# Patient Record
Sex: Male | Born: 1990
Health system: Southern US, Community
[De-identification: ages and names within clinical notes are randomized; demographics above are authoritative.]

## PROBLEM LIST (undated history)

## (undated) DIAGNOSIS — E1110 Type 2 diabetes mellitus with ketoacidosis without coma: Secondary | ICD-10-CM

## (undated) DIAGNOSIS — E669 Obesity, unspecified: Secondary | ICD-10-CM

## (undated) DIAGNOSIS — R739 Hyperglycemia, unspecified: Secondary | ICD-10-CM

## (undated) DIAGNOSIS — E119 Type 2 diabetes mellitus without complications: Secondary | ICD-10-CM

## (undated) DIAGNOSIS — J45909 Unspecified asthma, uncomplicated: Secondary | ICD-10-CM

## (undated) HISTORY — PX: LACERATION REPAIR: SHX5168

## (undated) HISTORY — PX: TONSILLECTOMY: SUR1361

## (undated) HISTORY — PX: ADENOIDECTOMY: SUR15

---

## 2014-01-22 ENCOUNTER — Encounter (HOSPITAL_COMMUNITY): Payer: Self-pay | Admitting: Emergency Medicine

## 2014-01-22 ENCOUNTER — Emergency Department (HOSPITAL_COMMUNITY)
Admission: EM | Admit: 2014-01-22 | Discharge: 2014-01-22 | Disposition: A | Discharge status: Home / Self Care | Payer: Self-pay | Attending: Emergency Medicine | Admitting: Emergency Medicine

## 2014-01-22 ENCOUNTER — Emergency Department (HOSPITAL_COMMUNITY): Payer: Self-pay

## 2014-01-22 DIAGNOSIS — S6990XA Unspecified injury of unspecified wrist, hand and finger(s), initial encounter: Principal | ICD-10-CM | POA: Insufficient documentation

## 2014-01-22 DIAGNOSIS — F17200 Nicotine dependence, unspecified, uncomplicated: Secondary | ICD-10-CM | POA: Insufficient documentation

## 2014-01-22 DIAGNOSIS — L03019 Cellulitis of unspecified finger: Secondary | ICD-10-CM | POA: Insufficient documentation

## 2014-01-22 DIAGNOSIS — S6980XA Other specified injuries of unspecified wrist, hand and finger(s), initial encounter: Secondary | ICD-10-CM | POA: Insufficient documentation

## 2014-01-22 DIAGNOSIS — IMO0001 Reserved for inherently not codable concepts without codable children: Secondary | ICD-10-CM

## 2014-01-22 MED ORDER — OXYCODONE-ACETAMINOPHEN 5-325 MG PO TABS: 1.0000 | ORAL_TABLET | Freq: Once | ORAL | Status: DC

## 2014-01-22 MED ORDER — CEPHALEXIN 500 MG PO CAPS: 500.0000 mg | ORAL_CAPSULE | Freq: Four times a day (QID) | ORAL | Status: DC

## 2014-01-22 MED ORDER — IBUPROFEN 600 MG PO TABS: 600.0000 mg | ORAL_TABLET | Freq: Four times a day (QID) | ORAL | Status: DC | PRN

## 2014-01-22 MED ORDER — OXYCODONE-ACETAMINOPHEN 5-325 MG PO TABS: ORAL_TABLET | ORAL | Status: DC

## 2014-01-22 MED ORDER — SULFAMETHOXAZOLE-TRIMETHOPRIM 800-160 MG PO TABS: 1.0000 | ORAL_TABLET | Freq: Two times a day (BID) | ORAL | Status: DC
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
## 2014-01-22 NOTE — Discharge Instructions (Signed)
Paronychia   Paronychia is an infection of the skin caused by germs. It happens by the fingernail or toenail. You can avoid it by not:   Pulling on hangnails.   Nail biting.   Thumb sucking.   Cutting fingernails and toenails too short.   Cutting the skin at the base and sides of the fingernail or toenail (cuticle).  HOME CARE   Keep the fingers or toes very dry. Put rubber gloves over cotton gloves when putting hands in water.   Keep the wound clean and bandaged (dressed) as told by your doctor.   Soak the fingers or toes in warm water for 15 to 20 minutes. Soak them 3 to 4 times per day for germ infections. Fungal infections are difficult to treat. Fungal infections often require treatment for a long time.   Only take medicine as told by your doctor.  GET HELP RIGHT AWAY IF:    You have redness, puffiness (swelling), or pain that gets worse.   You see yellowish-white fluid (pus) coming from the wound.   You have a fever.   You have a bad smell coming from the wound or bandage.  MAKE SURE YOU:   Understand these instructions.   Will watch your condition.   Will get help if you are not doing well or get worse.  Document Released: 10/04/2009 Document Revised: 01/08/2012 Document Reviewed: 10/04/2009  Exit[employer] Patient Information 2014 [employer].
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
## 2014-01-22 NOTE — ED Notes (Signed)
Patient transported to X-ray 

## 2014-01-22 NOTE — ED Notes (Signed)
Right pointer finger swollen x 2 days; iced it but did not go down; gotten worse and painful.

## 2014-01-22 NOTE — ED Notes (Signed)
Suture cart at bedside 

## 2014-01-25 NOTE — ED Provider Notes (Signed)
Medical screening examination/treatment/procedure(s) were conducted as a shared visit with non-physician practitioner(s) and myself.  I personally evaluated the patient during the encounter.   EKG Interpretation None     Physical exam reveals a paronychia of the second right finger.   Possibility of early felon entertained. Discharge medications and instructions per physician's assistant  Donnetta HutchingBrian Wyonia Fontanella, MD 01/25/14 1517

## 2015-01-18 ENCOUNTER — Encounter (HOSPITAL_COMMUNITY): Payer: Self-pay | Admitting: Emergency Medicine

## 2015-01-18 ENCOUNTER — Emergency Department (HOSPITAL_COMMUNITY)
Admission: EM | Admit: 2015-01-18 | Discharge: 2015-01-18 | Disposition: A | Discharge status: Home / Self Care | Payer: Self-pay | Attending: Emergency Medicine | Admitting: Emergency Medicine

## 2015-01-18 DIAGNOSIS — Z792 Long term (current) use of antibiotics: Secondary | ICD-10-CM | POA: Insufficient documentation

## 2015-01-18 DIAGNOSIS — Z720 Tobacco use: Secondary | ICD-10-CM | POA: Insufficient documentation

## 2015-01-18 DIAGNOSIS — L03011 Cellulitis of right finger: Secondary | ICD-10-CM | POA: Insufficient documentation

## 2015-01-18 DIAGNOSIS — J45909 Unspecified asthma, uncomplicated: Secondary | ICD-10-CM | POA: Insufficient documentation

## 2015-01-18 HISTORY — DX: Unspecified asthma, uncomplicated: J45.909

## 2015-01-18 MED ORDER — DOXYCYCLINE HYCLATE 100 MG PO TABS
100.0000 mg | ORAL_TABLET | Freq: Once | ORAL | Status: AC
  Administered 2015-01-18: 100 mg via ORAL
  Filled 2015-01-18: qty 1

## 2015-01-18 MED ORDER — RANITIDINE HCL 150 MG/10ML PO SYRP: 300.0000 mg | ORAL_SOLUTION | Freq: Once | ORAL | Status: DC

## 2015-01-18 MED ORDER — LORATADINE 10 MG PO TABS: 10.0000 mg | ORAL_TABLET | Freq: Once | ORAL | Status: DC

## 2015-01-18 MED ORDER — CEPHALEXIN 250 MG PO CAPS
500.0000 mg | ORAL_CAPSULE | Freq: Once | ORAL | Status: AC
  Administered 2015-01-18: 500 mg via ORAL
  Filled 2015-01-18: qty 2

## 2015-01-18 MED ORDER — CEPHALEXIN 500 MG PO CAPS: 500.0000 mg | ORAL_CAPSULE | Freq: Two times a day (BID) | ORAL | Status: DC

## 2015-01-18 MED ORDER — DOXYCYCLINE HYCLATE 100 MG PO CAPS: 100.0000 mg | ORAL_CAPSULE | Freq: Two times a day (BID) | ORAL | Status: DC

## 2015-01-18 MED ORDER — LIDOCAINE-EPINEPHRINE 1 %-1:100000 IJ SOLN
10.0000 mL | Freq: Once | INTRAMUSCULAR | Status: AC
  Administered 2015-01-18: 10 mL via INTRADERMAL
  Filled 2015-01-18: qty 1
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
## 2015-01-18 NOTE — Discharge Instructions (Signed)
Paronychia  [name], your finger infection likely came from biting her nails. Try to refrain from doing this in the future. Take antibiotics as prescribed for 5 days and follow-up with her primary care physician within 3 days for repeat evaluation of your finger. You also need to your blood pressure rechecked as it was high today. If any symptoms worsen come back to the emergency department and medially. Thank you. Paronychia is an infection of the skin caused by germs. It happens by the fingernail or toenail. You can avoid it by not:  Pulling on hangnails.  Nail biting.  Thumb sucking.  Cutting fingernails and toenails too short.  Cutting the skin at the base and sides of the fingernail or toenail (cuticle). HOME CARE  Keep the fingers or toes very dry. Put rubber gloves over cotton gloves when putting hands in water.  Keep the wound clean and bandaged (dressed) as told by your doctor.  Soak the fingers or toes in warm water for 15 to 20 minutes. Soak them 3 to 4 times per day for germ infections. Fungal infections are difficult to treat. Fungal infections often require treatment for a long time.  Only take medicine as told by your doctor. GET HELP RIGHT AWAY IF:   You have redness, puffiness (swelling), or pain that gets worse.  You see yellowish-white fluid (pus) coming from the wound.  You have a fever.  You have a bad smell coming from the wound or bandage. MAKE SURE YOU:  Understand these instructions.  Will watch your condition.  Will get help if you are not doing well or get worse. Document Released: 10/04/2009 Document Revised: 01/08/2012 Document Reviewed: 10/04/2009 [facility] Patient Information 2015 [employer], [place]. This information is not intended to replace advice given to you by your health care provider. Make sure you discuss any questions you have with your health care provider. Hypertension Hypertension is another name for high blood pressure. High  blood pressure forces your heart to work harder to pump blood. A blood pressure reading has two numbers, which includes a higher number over a lower number (example: 110/72). HOME CARE   Have your blood pressure rechecked by your doctor.  Only take medicine as told by your doctor. Follow the directions carefully. The medicine does not work as well if you skip doses. Skipping doses also puts you at risk for problems.  Do not smoke.  Monitor your blood pressure at home as told by your doctor. GET HELP IF:  You think you are having a reaction to the medicine you are taking.  You have repeat headaches or feel dizzy.  You have puffiness (swelling) in your ankles.  You have trouble with your vision. GET HELP RIGHT AWAY IF:   You get a very bad headache and are confused.  You feel weak, numb, or faint.  You get chest or belly (abdominal) pain.  You throw up (vomit).  You cannot breathe very well. MAKE SURE YOU:   Understand these instructions.  Will watch your condition.  Will get help right away if you are not doing well or get worse. Document Released: 04/03/2008 Document Revised: 10/21/2013 Document Reviewed: 08/08/2013 [facility] Patient Information 2015 [place], [place]. This information is not intended to replace advice given to you by your health care provider. Make sure you discuss any questions you have with your health care provider.
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
## 2015-06-29 ENCOUNTER — Encounter (HOSPITAL_COMMUNITY): Payer: Self-pay | Admitting: Emergency Medicine

## 2015-06-29 ENCOUNTER — Emergency Department (HOSPITAL_COMMUNITY)
Admission: EM | Admit: 2015-06-29 | Discharge: 2015-06-30 | Disposition: A | Discharge status: Home / Self Care | Payer: Self-pay | Attending: Emergency Medicine | Admitting: Emergency Medicine

## 2015-06-29 DIAGNOSIS — Z792 Long term (current) use of antibiotics: Secondary | ICD-10-CM | POA: Insufficient documentation

## 2015-06-29 DIAGNOSIS — K088 Other specified disorders of teeth and supporting structures: Secondary | ICD-10-CM | POA: Insufficient documentation

## 2015-06-29 DIAGNOSIS — J45909 Unspecified asthma, uncomplicated: Secondary | ICD-10-CM | POA: Insufficient documentation

## 2015-06-29 DIAGNOSIS — J029 Acute pharyngitis, unspecified: Secondary | ICD-10-CM | POA: Insufficient documentation

## 2015-06-29 DIAGNOSIS — Z720 Tobacco use: Secondary | ICD-10-CM | POA: Insufficient documentation

## 2015-06-29 DIAGNOSIS — K0889 Other specified disorders of teeth and supporting structures: Secondary | ICD-10-CM

## 2015-06-29 DIAGNOSIS — H9201 Otalgia, right ear: Secondary | ICD-10-CM | POA: Insufficient documentation

## 2015-06-29 NOTE — ED Provider Notes (Signed)
History  This chart was scribed for non-physician practitioner, Fayrene Helper, PA-C,working with Shon Baton, MD, by Karle Plumber, ED Scribe. This patient was seen in room TR10C/TR10C and the patient's care was started at 11:57 PM.  Chief Complaint  Patient presents with  . Dental Pain   The history is provided by the patient and medical records. No language interpreter was used.    HPI Comments:  [name] is a 24 y.o. morbidly obese male who presents to the Emergency Department complaining of severe right-sided dental pain that began approximately three weeks ago. He reports associated right ear otalgia and intermittent sore throat as well. Pt reports he has a crack in one of his teeth on the right side. He has been taking BC Powder with no significant relief of the pain. cold air, cold liquids and foods and chewing causes the pain to worsen. He denies modifying factors. He denies fever, chills, nausea, vomiting or facial swelling. He does not have a Education officer, community.  Past Medical History  Diagnosis Date  . Asthma    Past Surgical History  Procedure Laterality Date  . Tonsillectomy     No family history on file. Social History  Substance Use Topics  . Smoking status: Current Every Day Smoker -- 0.00 packs/day    Types: Cigarettes  . Smokeless tobacco: None  . Alcohol Use: No    Review of Systems  Constitutional: Negative for fever and chills.  HENT: Positive for dental problem, ear pain and sore throat. Negative for facial swelling.   Gastrointestinal: Negative for nausea and vomiting.    Allergies  Vicodin and Lactose intolerance (gi)  Home Medications   Prior to Admission medications   Medication Sig Start Date End Date Taking? Authorizing Provider  cephALEXin (KEFLEX) 500 MG capsule Take 1 capsule (500 mg total) by mouth 2 (two) times daily. 01/18/15   Tomasita Crumble, MD  doxycycline (VIBRAMYCIN) 100 MG capsule Take 1 capsule (100 mg total) by mouth 2 (two) times  daily. One po bid x 7 days 01/18/15   Tomasita Crumble, MD  ibuprofen (ADVIL,MOTRIN) 600 MG tablet Take 1 tablet (600 mg total) by mouth every 6 (six) hours as needed. 01/22/14   Junius Finner, PA-C  oxyCODONE-acetaminophen (PERCOCET/ROXICET) 5-325 MG per tablet Take 1-2 pills every 4-6 hours as needed for pain. 01/22/14   Junius Finner, PA-C  sulfamethoxazole-trimethoprim (SEPTRA DS) 800-160 MG per tablet Take 1 tablet by mouth every 12 (twelve) hours. 01/22/14   Junius Finner, PA-C   There were no vitals taken for this visit. Physical Exam  Constitutional: He is oriented to person, place, and time. He appears well-developed and well-nourished.  HENT:  Head: Normocephalic and atraumatic.  Right Ear: Tympanic membrane and ear canal normal.  Left Ear: Tympanic membrane and ear canal normal.  Mouth/Throat: Uvula is midline, oropharynx is clear and moist and mucous membranes are normal. No trismus in the jaw. Normal dentition. No dental caries. No oropharyngeal exudate, posterior oropharyngeal edema, posterior oropharyngeal erythema or tonsillar abscesses.  Tenderness to tooth #2. No significant dental decay.  Eyes: EOM are normal.  Neck: Normal range of motion.  Cardiovascular: Normal rate.   Pulmonary/Chest: Effort normal.  Musculoskeletal: Normal range of motion.  Lymphadenopathy:    He has no cervical adenopathy.  Neurological: He is alert and oriented to person, place, and time.  Skin: Skin is warm and dry.  Psychiatric: He has a normal mood and affect. His behavior is normal.  Nursing note and vitals reviewed.  ED Course  Procedures (including critical care time) DIAGNOSTIC STUDIES:  COORDINATION OF CARE: 12:03 AM- Will prescribe PCN and Ibuprofen and order first doses here. Pt verbalizes understanding and agrees to plan.  Medications  penicillin v potassium (VEETID) tablet 500 mg (not administered)  ibuprofen (ADVIL,MOTRIN) tablet 800 mg (not administered)    MDM   Final  diagnoses:  Pain, dental    I personally performed the services described in this documentation, which was scribed in my presence. The recorded information has been reviewed and is accurate.     Fayrene Helper, PA-C 06/30/15 8119  Shon Baton, MD 06/30/15 [phone]

## 2015-06-29 NOTE — ED Notes (Signed)
Pt c/o upper and lower right dental pain x 3 weeks

## 2015-06-29 NOTE — ED Notes (Signed)
Pt. reports right upper/lower molar pain for several weeks unrelieved by OTC medication .

## 2015-06-30 MED ORDER — IBUPROFEN 600 MG PO TABS: 600.0000 mg | ORAL_TABLET | Freq: Four times a day (QID) | ORAL | Status: DC | PRN

## 2015-06-30 MED ORDER — PENICILLIN V POTASSIUM 250 MG PO TABS
500.0000 mg | ORAL_TABLET | Freq: Once | ORAL | Status: AC
  Administered 2015-06-30: 500 mg via ORAL
  Filled 2015-06-30: qty 2

## 2015-06-30 MED ORDER — IBUPROFEN 400 MG PO TABS
800.0000 mg | ORAL_TABLET | Freq: Once | ORAL | Status: AC
  Administered 2015-06-30: 800 mg via ORAL
  Filled 2015-06-30: qty 2

## 2015-06-30 MED ORDER — PENICILLIN V POTASSIUM 500 MG PO TABS: 500.0000 mg | ORAL_TABLET | Freq: Three times a day (TID) | ORAL | Status: DC

## 2015-06-30 NOTE — Discharge Instructions (Signed)

## 2015-07-21 ENCOUNTER — Encounter (HOSPITAL_COMMUNITY): Payer: Self-pay | Admitting: Emergency Medicine

## 2015-07-21 ENCOUNTER — Emergency Department (HOSPITAL_COMMUNITY)
Admission: EM | Admit: 2015-07-21 | Discharge: 2015-07-21 | Disposition: A | Discharge status: Home / Self Care | Payer: Self-pay | Attending: Emergency Medicine | Admitting: Emergency Medicine

## 2015-07-21 DIAGNOSIS — J45909 Unspecified asthma, uncomplicated: Secondary | ICD-10-CM | POA: Insufficient documentation

## 2015-07-21 DIAGNOSIS — Z720 Tobacco use: Secondary | ICD-10-CM | POA: Insufficient documentation

## 2015-07-21 DIAGNOSIS — H9201 Otalgia, right ear: Secondary | ICD-10-CM | POA: Insufficient documentation

## 2015-07-21 DIAGNOSIS — K0889 Other specified disorders of teeth and supporting structures: Secondary | ICD-10-CM

## 2015-07-21 DIAGNOSIS — Z792 Long term (current) use of antibiotics: Secondary | ICD-10-CM | POA: Insufficient documentation

## 2015-07-21 DIAGNOSIS — K088 Other specified disorders of teeth and supporting structures: Secondary | ICD-10-CM | POA: Insufficient documentation

## 2015-07-21 MED ORDER — NAPROXEN 500 MG PO TABS: 500.0000 mg | ORAL_TABLET | Freq: Two times a day (BID) | ORAL | Status: DC

## 2015-07-21 MED ORDER — ANTIPYRINE-BENZOCAINE 5.4-1.4 % OT SOLN: 3.0000 [drp] | OTIC | Status: DC | PRN

## 2015-07-21 MED ORDER — PENICILLIN V POTASSIUM 500 MG PO TABS: 500.0000 mg | ORAL_TABLET | Freq: Three times a day (TID) | ORAL | Status: DC

## 2015-07-21 NOTE — Discharge Instructions (Signed)
Dental Pain °A tooth ache may be caused by cavities (tooth decay). Cavities expose the nerve of the tooth to air and hot or cold temperatures. It may come from an infection or abscess (also called a boil or furuncle) around your tooth. It is also often caused by dental caries (tooth decay). This causes the pain you are having. °DIAGNOSIS  °Your caregiver can diagnose this problem by exam. °TREATMENT  °· If caused by an infection, it may be treated with medications which kill germs (antibiotics) and pain medications as prescribed by your caregiver. Take medications as directed. °· Only take over-the-counter or prescription medicines for pain, discomfort, or fever as directed by your caregiver. °· Whether the tooth ache today is caused by infection or dental disease, you should see your dentist as soon as possible for further care. °SEEK MEDICAL CARE IF: °The exam and treatment you received today has been provided on an emergency basis only. This is not a substitute for complete medical or dental care. If your problem worsens or new problems (symptoms) appear, and you are unable to meet with your dentist, call or return to this location. °SEEK IMMEDIATE MEDICAL CARE IF:  °· You have a fever. °· You develop redness and swelling of your face, jaw, or neck. °· You are unable to open your mouth. °· You have severe pain uncontrolled by pain medicine. °MAKE SURE YOU:  °· Understand these instructions. °· Will watch your condition. °· Will get help right away if you are not doing well or get worse. °Document Released: 10/16/2005 Document Revised: 01/08/2012 Document Reviewed: 06/03/2008 °ExitCare® Patient Information ©2015 [employer]. This information is not intended to replace advice given to you by your health care provider. Make sure you discuss any questions you have with your health care provider. ° °Emergency Department Resource Guide °1) Find a Doctor and Pay Out of Pocket °Although you won't have to find out who  is covered by your insurance plan, it is a good idea to ask around and get recommendations. You will then need to call the office and see if the doctor you have chosen will accept you as a new patient and what types of options they offer for patients who are self-pay. Some doctors offer discounts or will set up payment plans for their patients who do not have insurance, but you will need to ask so you aren't surprised when you get to your appointment. ° °2) Contact Your Local Health Department °Not all health departments have doctors that can see patients for sick visits, but many do, so it is worth a call to see if yours does. If you don't know where your local health department is, you can check in your phone book. The CDC also has a tool to help you locate your state's health department, and many state websites also have listings of all of their local health departments. ° °3) Find a Walk-in Clinic °If your illness is not likely to be very severe or complicated, you may want to try a walk in clinic. These are popping up all over the country in pharmacies, drugstores, and shopping centers. They're usually staffed by nurse practitioners or physician assistants that have been trained to treat common illnesses and complaints. They're usually fairly quick and inexpensive. However, if you have serious medical issues or chronic medical problems, these are probably not your best option. ° °No Primary Care Doctor: °- Call [facility] at  832-8000 - they can help you locate a primary   care doctor that  accepts your insurance, provides certain services, etc. °- Physician Referral Service- 1-800-533-3463 ° °Chronic Pain Problems: °Organization         Address  Phone   Notes  °[facility]  (336) 297-2271 Patients need to be referred by their primary care doctor.  ° °Medication Assistance: °Organization         Address  Phone   Notes  °[place] [facility] [address]  [address] °[place], [place] [address] (336) 641-8030 --Must be a resident of [place] °-- Must have NO insurance coverage whatsoever (no Medicaid/ Medicare, etc.) °-- The pt. MUST have a primary care doctor that directs their care regularly and follows them in the community °  °[facility]  (866) 331-1348   °[employer]  (888) 892-1162   ° °Agencies that provide inexpensive medical care: °Organization         Address  Phone   Notes  °[facility]  (336) 832-8035   °[facility]    (336) 832-7272   °[facility] [address] °[place], [place] [address] (336) 832-4777   °[facility] [address], °[place] (336) 271-4999   °[facility]    (336) 373-0678   °[facility]    (336) 272-1050   °[facility] ° [address], [place] Phone:  (336) 832-4444, Fax:  (336) 832-4440 Hours of Operation:  9 am - 6 pm, M-F.  Also accepts Medicaid/Medicare and self-pay.  °[facility] ° [address], [place] Phone: (336) 832-3150, Fax: (336) 832-3151. Hours of Operation:  8:30 am - 5:30 pm, M-F.  Also accepts Medicaid and self-pay.  °[facility] [place] [address], [place] Phone: (336) 878-6027   °[facility] [address], [place], [place] (336)723-1848, Ext. 123 Mondays & Thursdays: 7-9 AM.  First 15 patients are seen on a first come, first serve basis. °  ° °Medicaid-accepting [place] Providers: ° °Organization         Address  Phone   Notes  °[facility] [address], [place] (336) 641-2100 Also accepts self-pay patients.  °[facility] [address], [place] ° (336) 856-9996   °[facility] [address], [place] (336) 288-8857   °[facility] [address] [place] [address], [place] (336) 299-7000   °Veita Bland [address], [place]  ° (336) 373-1557 Only accepts [place] Access Medicaid patients after they have their name applied to their card.  ° °Self-Pay (no insurance) in [place]: ° °Organization         Address  Phone   Notes  °Sickle Cell Patients, [facility] [address], [place] (336) 832-1970   °[facility] [address], [place] (336) 832-4400   °[facility] [place] ° [address], [place] (336) 992-4800   °[facility]/Dr. Osei-Bonsu ° [address] [place] [address], [place] or [address], [place] (336) 841-8500 Phone number for both [place] and [place] locations is the same.  °[facility] [address], [place] (336) 299-0000   °[facility] [address] [place] [address],  or [address] (336) 852-7530 °(336) 878-2260   °  [facility] [address], [place] (336) 350-1642, phone; (336) 294-5005, fax Sees patients 1st and 3rd Saturday of every month.  Must not qualify for public or private insurance (i.e. Medicaid, Medicare, [place] Health Choice, Veterans' Benefits) • Household income should be no more than 200% of the poverty level •The clinic cannot treat you if you are pregnant or think you are pregnant • Sexually transmitted diseases are not treated at the clinic.  ° ° °Dental Care: °Organization         Address  Phone  Notes  °[facility] [address], [place] (336) 641-6152 Accepts children up to age 21 who are enrolled in Medicaid or [place] Health Choice; pregnant women with a Medicaid card; and children who have applied for Medicaid or [place] Health Choice, but were declined, whose parents can pay a reduced fee at time of service.  °[facility] [place]  [address], [place] (336) 641-7733 Accepts children up to age 21 who are  enrolled in Medicaid or [place] Health Choice; pregnant women with a Medicaid card; and children who have applied for Medicaid or [place] Health Choice, but were declined, whose parents can pay a reduced fee at time of service.  °[facility] ° [address], [place] (336) 641-4533 Patients are seen by appointment only. Walk-ins are not accepted. [facility] will see patients 18 years of age and older. °Monday - Tuesday (8am-5pm) °Most Wednesdays (8:30-5pm) °$30 per visit, cash only  °[facility] ° [address], [place] (336) 641-4533 Patients are seen by appointment only. Walk-ins are not accepted. [facility] will see patients 18 years of age and older. °One Wednesday Evening (Monthly: Volunteer Based).  $30 per visit, cash only  °[facility]  (919) 537-3737 for adults; Children under age 4, call Graduate Pediatric Dentistry at (919) [phone]. Children aged 4-14, please call (919) [phone] to request a pediatric application. ° Dental services are provided in all areas of dental care including fillings, crowns and bridges, complete and partial dentures, implants, gum treatment, root canals, and extractions. Preventive care is also provided. Treatment is provided to both adults and children. °Patients are selected via a lottery and there is often a waiting list. °  °[facility] [address], °[place] ° (336) 763-8833 [email or web address] °  °[facility] [address], [place], [place] (336)723-1848, Ext. 123 Second and Fourth Thursday of each month, opens at 6:30 AM; Clinic ends at 9 AM.  Patients are seen on a first-come first-served basis, and a limited number are seen during each clinic.  ° °[facility] ° [address], [place], [place] (336) 723-7904   Eligibility Requirements °You must have lived in [place], [place], or [place] counties for at least the last three months. °  You  cannot be eligible for state or federal sponsored healthcare insurance, including Veterans Administration, Medicaid, or Medicare. °  You generally cannot be eligible for healthcare insurance through your employer.  °  How to apply: °Eligibility screenings are held every Tuesday and Wednesday afternoon from 1:00 pm until 4:00 pm. You do not need an appointment for the interview!  °[facility] [address], [place], [place] 336-631-2330   °[facility]  336-342-8273   °[facility]  336-703-3100   °[facility]  336-570-6415   ° °Behavioral Health Resources in the Community: °Intensive Outpatient Programs °Organization         Address  Phone  Notes  °[facility] [address], [place], [place] 336-878-6098   °[facility] [address], [place], [place] 336-832-9800   °[facility] [address], [place], [place] ° 336-882-2125   °[facility] [address],  °[place], [place] 1-800-853-5163 or 336-641-4981   °Substance Abuse Resources °Organization         Address  Phone  Notes  °[facility]  336-882-2125   °[facility]  336-784-9470   °[facility]  336-285-9073   °[facility]  336-845-3988   °Residential & Outpatient Substance Abuse Program  1-800-659-3381   °Psychological Services °Organization         Address  Phone  Notes  °[facility]  336- 832-9600   °[facility]  336- 378-7881   °[facility] [address], [place] 1-800-853-5163 or 336-641-4981   ° °Mobile Crisis Teams °Organization         Address  Phone  Notes  °[facility], Mobile Crisis Care Unit  1-877-626-1772   °Assertive °[facility] ° [address] [place], [place] 336-834-9664   °Sharon DeEsch [address] °[place] 336-554-5454   ° °Self-Help/Support  Groups °Organization         Address  Phone             Notes  °[facility] - variety of support groups  336- 373-1402 Call for more information  °Narcotics Anonymous (NA), [facility] [address], °[place]  2 meetings at this location  ° °Residential Treatment Programs °Organization         Address  Phone  Notes  °[facility] [address],    °[place]  1-866-801-8205   °[facility] ° [address], [place], [place] 704-293-8524   °[facility] [address], [place] 336-845-3988 Admissions: 8am-3pm M-F  °[facility] [address]    °[place], [place] 336-841-1104   °[facility] [address], [place], [place] 336-379-7146   °[facility] [address]  °, [place] 336-285-9073   °[facility] - Intensive Outpatient [address], , [place] 336-852-3033   °[facility] [address]  °[place], [place] 1-877-615-2722 or 336-784-9470   °[facility] [address] [place], [place] 336-227-7417 Accepts Medicaid  °[facility] [address]  ° [place] 1-800-659-3381 Substance Abuse/Addiction Treatment  ° °[place] Behavioral Health Resources °Organization         Address  Phone  Notes  °[facility]  (888) 581-9988   °Julie Brannon, PhD [address] [place], [place]   (336) 349-5553 or (336) 951-0000   °[facility]   [address] °[place], [place] (336) 349-4454   °[facility] [address], [place],  (336) 342-8316 Insurance/Medicaid/sponsorship through [facility]  °[facility] [address]                                    [place],  (336) 342-8316 Therapy/tele-psych/case  °[facility]   [address]   [place], [place] [phone]    Dr. Lolly Mustache  (931) 219-3205   [facility]  [employer] [facility] Dept. 1) [address] [place] 2) [address] [place] 3)  [address], [place] 912-108-1152 (818) 194-3581  269-290-7573   [facility] Child Abuse Hotline 959-339-7809 or (912)815-9391 (After Hours)     Otalgia Otalgia is pain in or around the ear. When the pain is from the ear itself it is called primary otalgia. Pain may also be coming from somewhere else, like the head and neck. This is called secondary otalgia.  CAUSES  Causes of primary otalgia include:  Middle ear infection.  It can also be caused by injury to the ear or infection of the ear canal (swimmer's ear). Swimmer's ear causes pain, swelling and often drainage from the ear canal. Causes of secondary otalgia include:  Sinus infections.  Allergies and colds that cause stuffiness of the nose and tubes that drain the ears (eustachian tubes).  Dental problems like cavities, gum infections or teething.  Sore Throat (tonsillitis and pharyngitis).  Swollen glands in the neck.  Infection of the bone behind the ear (mastoiditis).  TMJ discomfort (problems with the joint between your jaw and your skull).  Other problems such as nerve disorders, circulation problems, heart disease and tumors of the head and neck can also cause symptoms of ear pain. This is rare. DIAGNOSIS  Evaluation, Diagnosis and Testing:  Examination by your medical caregiver is recommended to evaluate and diagnose the cause of otalgia.  Further testing or referral to a specialist may be indicated if the cause of the ear pain is not found and the symptom persists. TREATMENT   Your doctor may prescribe antibiotics if an ear infection is diagnosed.  Pain relievers and topical analgesics may be recommended.  It is important to take all medications as prescribed. HOME CARE INSTRUCTIONS   It may be helpful to sleep with the painful ear in the up position.  A warm compress  over the painful ear may provide relief.  A soft diet and avoiding gum may help while ear pain is present. SEEK IMMEDIATE MEDICAL CARE IF:  You develop severe pain, a high fever, repeated vomiting or dehydration.  You develop extreme dizziness, headache, confusion, ringing in the ears (tinnitus) or hearing loss. Document Released: 11/23/2004 Document Revised: 01/08/2012 Document Reviewed: 08/25/2009 [facility] Patient Information 2015 [place], [place]. This information is not intended to replace advice given to you by your health care provider. Make sure you discuss any questions you have with your health care provider.

## 2015-07-21 NOTE — ED Notes (Signed)
Pt. reports right upper molar pain for several weeks unrelieved by OTC Aspirin / Ibuprofen .

## 2015-07-21 NOTE — ED Provider Notes (Signed)
CSN: [id number]     Arrival date & time 07/21/15  0030 History   First MD Initiated Contact with Patient 07/21/15 0046     Chief Complaint  Patient presents with  . Dental Pain     (Consider location/radiation/quality/duration/timing/severity/associated sxs/prior Treatment) Patient is a 24 y.o. male presenting with tooth pain. The history is provided by the patient. No language interpreter was used.  Dental Pain Location:  Upper Upper teeth location:  1/RU 3rd molar Quality:  Throbbing Severity:  Moderate Onset quality:  Gradual Duration:  2 weeks Progression:  Waxing and waning Context: not dental caries   Ineffective treatments:  NSAIDs Associated symptoms: no fever and no trismus   Risk factors: no diabetes     Past Medical History  Diagnosis Date  . Asthma    Past Surgical History  Procedure Laterality Date  . Tonsillectomy     No family history on file. Social History  Substance Use Topics  . Smoking status: Current Every Day Smoker -- 0.00 packs/day    Types: Cigarettes  . Smokeless tobacco: None  . Alcohol Use: No    Review of Systems  Constitutional: Negative for fever.  HENT: Positive for dental problem.   All other systems reviewed and are negative.     Allergies  Vicodin and Lactose intolerance (gi)  Home Medications   Prior to Admission medications   Medication Sig Start Date End Date Taking? Authorizing Provider  ibuprofen (ADVIL,MOTRIN) 600 MG tablet Take 1 tablet (600 mg total) by mouth every 6 (six) hours as needed. 06/30/15   Fayrene Helper, PA-C  penicillin v potassium (VEETID) 500 MG tablet Take 1 tablet (500 mg total) by mouth 3 (three) times daily. 06/30/15   Fayrene Helper, PA-C   BP 149/95 mmHg  Pulse 76  Temp(Src) 98.3 F (36.8 C) (Oral)  Resp 20  Ht  (1.676 m)  Wt 316 lb (143.337 kg)  BMI 51.03 kg/m2  SpO2 100% Physical Exam  Constitutional: He is oriented to person, place, and time. He appears well-developed and  well-nourished.  HENT:  Head: Normocephalic.  Left Ear: There is mastoid tenderness. Tympanic membrane is not injected and not erythematous.  Mouth/Throat: Oropharynx is clear and moist. No oropharyngeal exudate.  Eyes: Pupils are equal, round, and reactive to light.  Neck: Neck supple.  Cardiovascular: Normal rate and regular rhythm.   Pulmonary/Chest: Effort normal and breath sounds normal.  Abdominal: Soft. Bowel sounds are normal.  Musculoskeletal: He exhibits no edema or tenderness.  Lymphadenopathy:    He has no cervical adenopathy.  Neurological: He is alert and oriented to person, place, and time.  Skin: Skin is warm and dry.  Psychiatric: He has a normal mood and affect.  Nursing note and vitals reviewed.   ED Course  Procedures (including critical care time) Labs Review Labs Reviewed - No data to display  Imaging Review No results found. I have personally reviewed and evaluated these images and lab results as part of my medical decision-making.   EKG Interpretation None      MDM   Final diagnoses:  None   Dental pain. Antibiotic, anti-inflammatory, dental follow-up Right otalgia.     [name], NP 07/21/15 0117  Layla Maw Ward, DO 07/21/15 4098

## 2016-07-21 ENCOUNTER — Encounter (HOSPITAL_COMMUNITY): Payer: Self-pay | Admitting: Emergency Medicine

## 2016-07-21 ENCOUNTER — Emergency Department (HOSPITAL_COMMUNITY)
Admission: EM | Admit: 2016-07-21 | Discharge: 2016-07-21 | Disposition: A | Discharge status: Home / Self Care | Payer: No Typology Code available for payment source | Attending: Emergency Medicine | Admitting: Emergency Medicine

## 2016-07-21 ENCOUNTER — Emergency Department (HOSPITAL_COMMUNITY): Payer: No Typology Code available for payment source

## 2016-07-21 DIAGNOSIS — Y939 Activity, unspecified: Secondary | ICD-10-CM | POA: Insufficient documentation

## 2016-07-21 DIAGNOSIS — M545 Low back pain: Secondary | ICD-10-CM | POA: Insufficient documentation

## 2016-07-21 DIAGNOSIS — Y92410 Unspecified street and highway as the place of occurrence of the external cause: Secondary | ICD-10-CM | POA: Insufficient documentation

## 2016-07-21 DIAGNOSIS — Y999 Unspecified external cause status: Secondary | ICD-10-CM | POA: Insufficient documentation

## 2016-07-21 DIAGNOSIS — R0789 Other chest pain: Secondary | ICD-10-CM | POA: Diagnosis not present

## 2016-07-21 DIAGNOSIS — M549 Dorsalgia, unspecified: Secondary | ICD-10-CM | POA: Diagnosis present

## 2016-07-21 DIAGNOSIS — M546 Pain in thoracic spine: Secondary | ICD-10-CM | POA: Diagnosis not present

## 2016-07-21 DIAGNOSIS — F17210 Nicotine dependence, cigarettes, uncomplicated: Secondary | ICD-10-CM | POA: Diagnosis not present

## 2016-07-21 DIAGNOSIS — J45909 Unspecified asthma, uncomplicated: Secondary | ICD-10-CM | POA: Insufficient documentation

## 2016-07-21 MED ORDER — IBUPROFEN 400 MG PO TABS
800.0000 mg | ORAL_TABLET | Freq: Once | ORAL | Status: AC
  Administered 2016-07-21: 800 mg via ORAL
  Filled 2016-07-21: qty 2

## 2016-07-21 MED ORDER — METHOCARBAMOL 500 MG PO TABS
750.0000 mg | ORAL_TABLET | Freq: Once | ORAL | Status: AC
  Administered 2016-07-21: 750 mg via ORAL
  Filled 2016-07-21: qty 2

## 2016-07-21 MED ORDER — IBUPROFEN 600 MG PO TABS: 600.0000 mg | ORAL_TABLET | Freq: Four times a day (QID) | ORAL | 0 refills | Status: DC | PRN

## 2016-07-21 MED ORDER — METHOCARBAMOL 500 MG PO TABS: 500.0000 mg | ORAL_TABLET | Freq: Two times a day (BID) | ORAL | 0 refills | Status: DC
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
## 2016-07-21 NOTE — Discharge Instructions (Signed)
Take your medications as prescribed as needed for pain relief. I also recommend applying ice for 15 minutes 3-4 times daily to affected areas for pain relief. Please follow up with a primary care provider from the Resource Guide provided below in one week if your symptoms have not improved. Please return to the Emergency Department if symptoms worsen or new onset of fever, numbness, tingling, groin anesthesia, loss of bowel or bladder, weakness.

## 2017-04-02 ENCOUNTER — Emergency Department (HOSPITAL_COMMUNITY): Payer: Self-pay

## 2017-04-02 ENCOUNTER — Emergency Department (HOSPITAL_COMMUNITY)
Admission: EM | Admit: 2017-04-02 | Discharge: 2017-04-02 | Disposition: A | Discharge status: Home / Self Care | Payer: Self-pay | Attending: Emergency Medicine | Admitting: Emergency Medicine

## 2017-04-02 ENCOUNTER — Encounter (HOSPITAL_COMMUNITY): Payer: Self-pay | Admitting: *Deleted

## 2017-04-02 DIAGNOSIS — J45909 Unspecified asthma, uncomplicated: Secondary | ICD-10-CM | POA: Insufficient documentation

## 2017-04-02 DIAGNOSIS — Y929 Unspecified place or not applicable: Secondary | ICD-10-CM | POA: Insufficient documentation

## 2017-04-02 DIAGNOSIS — S61215A Laceration without foreign body of left ring finger without damage to nail, initial encounter: Secondary | ICD-10-CM | POA: Insufficient documentation

## 2017-04-02 DIAGNOSIS — Y990 Civilian activity done for income or pay: Secondary | ICD-10-CM | POA: Insufficient documentation

## 2017-04-02 DIAGNOSIS — Z23 Encounter for immunization: Secondary | ICD-10-CM | POA: Insufficient documentation

## 2017-04-02 DIAGNOSIS — W208XXA Other cause of strike by thrown, projected or falling object, initial encounter: Secondary | ICD-10-CM | POA: Insufficient documentation

## 2017-04-02 DIAGNOSIS — F17210 Nicotine dependence, cigarettes, uncomplicated: Secondary | ICD-10-CM | POA: Insufficient documentation

## 2017-04-02 DIAGNOSIS — Z79899 Other long term (current) drug therapy: Secondary | ICD-10-CM | POA: Insufficient documentation

## 2017-04-02 DIAGNOSIS — Y939 Activity, unspecified: Secondary | ICD-10-CM | POA: Insufficient documentation

## 2017-04-02 HISTORY — DX: Obesity, unspecified: E66.9

## 2017-04-02 MED ORDER — SULFAMETHOXAZOLE-TRIMETHOPRIM 800-160 MG PO TABS: 1.0000 | ORAL_TABLET | Freq: Two times a day (BID) | ORAL | 0 refills | Status: AC

## 2017-04-02 MED ORDER — OXYCODONE-ACETAMINOPHEN 5-325 MG PO TABS
1.0000 | ORAL_TABLET | Freq: Once | ORAL | Status: AC
  Administered 2017-04-02: 1 via ORAL
  Filled 2017-04-02: qty 1

## 2017-04-02 MED ORDER — SULFAMETHOXAZOLE-TRIMETHOPRIM 800-160 MG PO TABS
1.0000 | ORAL_TABLET | Freq: Once | ORAL | Status: AC
  Administered 2017-04-02: 1 via ORAL
  Filled 2017-04-02: qty 1

## 2017-04-02 MED ORDER — TETANUS-DIPHTH-ACELL PERTUSSIS 5-2.5-18.5 LF-MCG/0.5 IM SUSP
0.5000 mL | Freq: Once | INTRAMUSCULAR | Status: AC
  Administered 2017-04-02: 0.5 mL via INTRAMUSCULAR
  Filled 2017-04-02: qty 0.5

## 2017-04-02 MED ORDER — OXYCODONE-ACETAMINOPHEN 5-325 MG PO TABS: 2.0000 | ORAL_TABLET | Freq: Three times a day (TID) | ORAL | 0 refills | Status: DC | PRN

## 2017-04-02 MED ORDER — LIDOCAINE HCL 2 % IJ SOLN
10.0000 mL | Freq: Once | INTRAMUSCULAR | Status: AC
Start: 2017-04-02 — End: 2017-04-02
  Administered 2017-04-02: 200 mg
  Filled 2017-04-02: qty 20

## 2017-04-02 NOTE — ED Triage Notes (Signed)
PT reports he needs a tetanus Vac.

## 2017-04-02 NOTE — ED Provider Notes (Signed)
[facility]PT Provider Note    By signing my name below, I, [name], attest that this documentation has been prepared under the direction and in the presence of Roger McDonald, PA-C. Electronically Signed: [name], ED Scribe. 04/02/17. 8:00 PM.  History   Chief Complaint Chief Complaint  Patient presents with  . Finger Injury    The history is provided by the patient and medical records. No language interpreter was used.    Jocob Farley is an obese 26 y.o. male who presents to the Emergency Department complaining of an injury to the left fourth finger that occurred about two hours ago. He reports associated throbbing pain and bleeding of a laceration that has been controlled. He reports some numbness of the left hand as well. He states a piece of 65 pound steel fell on the hand lacerating the fourth digit. He has not taken anything for pain. Touching the finger increases the pain. He denies alleviating factors. He denies HA, CP, tingling or weakness of the left hand or digits. He denies any previous injury to that hand. He is unsure of his last tetanus vaccination.   Past Medical History:  Diagnosis Date  . Asthma   . Obesity     There are no active problems to display for this patient.   Past Surgical History:  Procedure Laterality Date  . TONSILLECTOMY         Home Medications    Prior to Admission medications   Medication Sig Start Date End Date Taking? Authorizing Provider  antipyrine-benzocaine Lyla Son) otic solution Place 3 drops into the right ear every 2 (two) hours as needed. 07/21/15   [name], NP  ibuprofen (ADVIL,MOTRIN) 600 MG tablet Take 1 tablet (600 mg total) by mouth every 6 (six) hours as needed. 07/21/16   [name], PA-C  methocarbamol (ROBAXIN) 500 MG tablet Take 1 tablet (500 mg total) by mouth 2 (two) times daily. 07/21/16   [name], PA-C  naproxen (NAPROSYN) 500 MG tablet Take 1 tablet  (500 mg total) by mouth 2 (two) times daily. 07/21/15   [name], NP  oxyCODONE-acetaminophen (PERCOCET/ROXICET) 5-325 MG tablet Take 2 tablets by mouth every 8 (eight) hours as needed for severe pain. 04/02/17   Farley, Roger A, PA-C  penicillin v potassium (VEETID) 500 MG tablet Take 1 tablet (500 mg total) by mouth 3 (three) times daily. 07/21/15   [name], NP  sulfamethoxazole-trimethoprim (BACTRIM DS,SEPTRA DS) 800-160 MG tablet Take 1 tablet by mouth 2 (two) times daily. 04/02/17 04/12/17  Farley, [name], PA-C    Family History History reviewed. No pertinent family history.  Social History Social History  Substance Use Topics  . Smoking status: Current Every Day Smoker    Packs/day: 0.00    Types: Cigarettes  . Smokeless tobacco: Never Used  . Alcohol use No     Allergies   Vicodin [hydrocodone-acetaminophen] and Lactose intolerance (gi)   Review of Systems Review of Systems  Constitutional: Negative for activity change.  Respiratory: Negative for shortness of breath.   Cardiovascular: Negative for chest pain.  Gastrointestinal: Negative for abdominal pain.  Musculoskeletal: Positive for arthralgias. Negative for back pain.  Skin: Positive for wound. Negative for rash.  Neurological: Positive for numbness. Negative for weakness and headaches.   Physical Exam Updated Vital Signs BP (!) 148/102 (BP Location: Right Arm)   Pulse (!) 101   Temp 98.7 F (37.1 C) (Oral)   Resp 20   SpO2 98%  Physical Exam  Constitutional: He appears well-developed.  HENT:  Head: Normocephalic.  Eyes: Conjunctivae are normal.  Neck: Neck supple.  Cardiovascular: Normal rate, regular rhythm and normal heart sounds.  Exam reveals no gallop and no friction rub.   No murmur heard. Radial pulses 2+ bilaterally.  Pulmonary/Chest: Effort normal and breath sounds normal. No respiratory distress. He has no wheezes. He has no rales.  Abdominal: Soft. He exhibits no distension.    Musculoskeletal: He exhibits tenderness.  1.5 cm straight laceration to the ring finger of the left hand. Hemostatic. No obvious foreign bodies. Decreased ROM to the fourth digit. Decreased strength and sensation of the digit. Full ROM of left wrist.  Neurological: He is alert.  Skin: Skin is warm and dry.  Psychiatric: His behavior is normal.  Nursing note and vitals reviewed.  ED Treatments / Results  DIAGNOSTIC STUDIES: Oxygen Saturation is 98% on RA, normal by my interpretation.   COORDINATION OF CARE: 5:20 PM- Will update tetanus vaccination. Will order imaging prior to suturing wound. Will order pain medication. Pt verbalizes understanding and agrees to plan.  6:15 PM- Dr. Juleen ChinaKohut examined pt and recommended extensive irrigation prior to closure and have pt follow up with hand specialist.   Medications  oxyCODONE-acetaminophen (PERCOCET/ROXICET) 5-325 MG per tablet 1 tablet (1 tablet Oral Given 04/02/17 1752)  lidocaine (XYLOCAINE) 2 % (with pres) injection 200 mg (200 mg Infiltration Given 04/02/17 1829)  oxyCODONE-acetaminophen (PERCOCET/ROXICET) 5-325 MG per tablet 1 tablet (1 tablet Oral Given 04/02/17 1941)  Tdap (BOOSTRIX) injection 0.5 mL (0.5 mLs Intramuscular Given 04/02/17 1942)  sulfamethoxazole-trimethoprim (BACTRIM DS,SEPTRA DS) 800-160 MG per tablet 1 tablet (1 tablet Oral Given 04/02/17 2051)    Labs (all labs ordered are listed, but only abnormal results are displayed) Labs Reviewed - No data to display  EKG  EKG Interpretation None       Radiology No results found.  Procedures .Marland Kitchen.Laceration Repair Date/Time: 04/02/2017 6:56 PM Performed by: Roger KapurMCDONALD, Roger A Authorized by: Roger PearMCDONALD, Roger A   Consent:    Consent obtained:  Verbal   Consent given by:  Patient   Risks discussed:  Infection Anesthesia (see MAR for exact dosages):    Anesthesia method:  Local infiltration   Local anesthetic:  Lidocaine 2% w/o epi Laceration details:    Location:  Finger   Finger  location:  L ring finger   Length (cm):  1.5 Repair type:    Repair type:  Intermediate Pre-procedure details:    Preparation:  Patient was prepped and draped in usual sterile fashion and imaging obtained to evaluate for foreign bodies Exploration:    Hemostasis achieved with:  Direct pressure   Wound exploration: wound explored through full range of motion and entire depth of wound probed and visualized   Treatment:    Area cleansed with:  Betadine   Amount of cleaning:  Extensive   Irrigation solution:  Sterile saline Skin repair:    Repair method:  Sutures   Suture size:  4-0   Suture material:  Prolene   Suture technique:  Simple interrupted   Number of sutures:  5 Approximation:    Approximation:  Close   Vermilion border: well-aligned   Post-procedure details:    Dressing:  Sterile dressing   Patient tolerance of procedure:  Tolerated well, no immediate complications Comments:     A diligent effort was made to remove all obvious shards of metal with forceps and copious irrigation.       (including  critical care time)  Medications Ordered in ED Medications  oxyCODONE-acetaminophen (PERCOCET/ROXICET) 5-325 MG per tablet 1 tablet (1 tablet Oral Given 04/02/17 1752)  lidocaine (XYLOCAINE) 2 % (with pres) injection 200 mg (200 mg Infiltration Given 04/02/17 1829)  oxyCODONE-acetaminophen (PERCOCET/ROXICET) 5-325 MG per tablet 1 tablet (1 tablet Oral Given 04/02/17 1941)  Tdap (BOOSTRIX) injection 0.5 mL (0.5 mLs Intramuscular Given 04/02/17 1942)  sulfamethoxazole-trimethoprim (BACTRIM DS,SEPTRA DS) 800-160 MG per tablet 1 tablet (1 tablet Oral Given 04/02/17 2051)     Initial Impression / Assessment and Plan / ED Course  I have reviewed the triage vital signs and the nursing notes.  Pertinent labs & imaging results that were available during my care of the patient were reviewed by me and considered in my medical decision making (see chart for details).     Patient presenting  for a left fourth finger injury secondary to a piece of steel falling on it at work causing a laceration. X-Ray negative for obvious fracture or dislocation. Tetanus vaccination updated in ED. Laceration thoroughly irrigated and repaired. Pt advised to follow up with hand specialist. Pt to follow up for suture removal in 7-10 days and wound check sooner should there be signs of dehiscence or infection. Conservative therapy recommended and discussed. Patient will be discharged home & is agreeable with above plan. Returns precautions discussed. Hemodynamically stable. Pt appears safe for discharge.  Final Clinical Impressions(s) / ED Diagnoses   Final diagnoses:  Laceration of left ring finger without damage to nail, foreign body presence unspecified, initial encounter    New Prescriptions Discharge Medication List as of 04/02/2017  8:08 PM    START taking these medications   Details  oxyCODONE-acetaminophen (PERCOCET/ROXICET) 5-325 MG tablet Take 2 tablets by mouth every 8 (eight) hours as needed for severe pain., Starting Mon 04/02/2017, Print    sulfamethoxazole-trimethoprim (BACTRIM DS,SEPTRA DS) 800-160 MG tablet Take 1 tablet by mouth 2 (two) times daily., Starting Mon 04/02/2017, Until Thu 04/12/2017, Print        I personally performed the services described in this documentation, which was scribed in my presence. The recorded information has been reviewed and is accurate.     [name] A, PA-C 04/10/17 0259    Raeford Razor, MD 04/10/17 [phone]

## 2017-04-02 NOTE — ED Triage Notes (Signed)
Pt reports injuring left ring finger at work, had large piece of steel land on it. Has laceration to anterior finger, bleeding controlled. Good cap refill.

## 2017-04-02 NOTE — Progress Notes (Signed)
Orthopedic Tech Progress Note Patient Details:  Bridget HartshornGregory Flath [birth year] [id number]  Ortho Devices Type of Ortho Device: Finger splint Ortho Device/Splint Location: applied finger splint static to pt right hand ring finger.  pt tolerated application well.  right hand. Ortho Device/Splint Interventions: Application, Adjustment   Alvina ChouWilliams, Raykwon Hobbs C 04/02/2017, 8:26 PM

## 2017-04-02 NOTE — ED Provider Notes (Signed)
Medical screening examination/treatment/procedure(s) were conducted as a shared visit with non-physician practitioner(s) and myself.  I personally evaluated the patient during the encounter.   EKG Interpretation None      26yM with finger lac after large metal beam fell on it. Cannot r/o tendon injury. Pt either cannot or will not flex either PIP or DIP. I think it may be effort related. Noted FB are tiny metal shavings. He has them all over his hands. Will numb and copiously irrigate. Close skin. Will splint and have FU with hand surgery for further evaluation of possible tendon injury.  Dg Finger Ring Left  Result Date: 04/02/2017 CLINICAL DATA:  Injury to the left ring finger with open wound. EXAM: LEFT RING FINGER 2+V COMPARISON:  None. FINDINGS: There is no evidence of fracture or dislocation. There is soft tissue swelling at the level of the proximal interphalangeal joint with punctate hyperdense foci within the soft tissues which may represent wound contamination. IMPRESSION: No evidence of fracture. Suspected punctate foreign bodies within or overlying soft tissue wound at the level of the proximal interphalangeal joint of the left fourth finger. Electronically Signed   By: Ted Mcalpineobrinka  Dimitrova M.D.   On: 04/02/2017 18:00      Raeford RazorKohut, Emlyn Maves, MD 04/02/17 1821

## 2017-04-02 NOTE — Discharge Instructions (Signed)
You have been given your first dose of Bactrim, the antibiotic, in the Emergency Department. Please call Dr. Kathline MagicSwinteck's office tomorrow morning and schedule a follow up appointment. Please keep the finger splint on until the stitches are removed. Please have your stitches removed in 7 days. If you develop worsening symptoms including fever, chills or redness, swelling, and worsening pain to the finger, please return to the Emergency Department for re-evaluation.

## 2017-06-30 DIAGNOSIS — E1110 Type 2 diabetes mellitus with ketoacidosis without coma: Secondary | ICD-10-CM

## 2017-06-30 HISTORY — DX: Type 2 diabetes mellitus with ketoacidosis without coma: E11.10

## 2017-07-20 ENCOUNTER — Encounter (HOSPITAL_COMMUNITY): Payer: Self-pay | Admitting: Emergency Medicine

## 2017-07-20 ENCOUNTER — Emergency Department (HOSPITAL_COMMUNITY)
Admission: EM | Admit: 2017-07-20 | Discharge: 2017-07-20 | Discharge status: Against Medical Advice | Payer: Self-pay | Attending: Emergency Medicine | Admitting: Emergency Medicine

## 2017-07-20 DIAGNOSIS — K0889 Other specified disorders of teeth and supporting structures: Secondary | ICD-10-CM | POA: Insufficient documentation

## 2017-07-20 DIAGNOSIS — Z5321 Procedure and treatment not carried out due to patient leaving prior to being seen by health care provider: Secondary | ICD-10-CM | POA: Insufficient documentation

## 2017-07-20 MED ORDER — OXYCODONE-ACETAMINOPHEN 5-325 MG PO TABS
ORAL_TABLET | ORAL | Status: DC
Start: 2017-07-20 — End: 2017-07-21
  Filled 2017-07-20: qty 1

## 2017-07-20 MED ORDER — OXYCODONE-ACETAMINOPHEN 5-325 MG PO TABS
1.0000 | ORAL_TABLET | ORAL | Status: DC | PRN
  Administered 2017-07-20: 1 via ORAL

## 2017-07-20 NOTE — ED Notes (Signed)
Patient called for, Pt is not present in the waiting room.

## 2017-07-20 NOTE — ED Notes (Signed)
Still no answer.  Removed from system at this time.

## 2017-07-20 NOTE — ED Triage Notes (Signed)
Reports pain in left upper jaw due to bad tooth for three days.  Pain radiating into ear and throat on that side making it difficult to swallow or chew.  Taking ibuprofen at home with no decrease in pain.

## 2017-07-20 NOTE — ED Notes (Signed)
Called again with no answer.

## 2017-07-20 NOTE — ED Notes (Signed)
Percocet ordered per protocol.  Patient has taken before.  Allergic to vicodin.

## 2017-07-24 ENCOUNTER — Encounter (HOSPITAL_COMMUNITY): Payer: Self-pay | Admitting: Neurology

## 2017-07-24 ENCOUNTER — Inpatient Hospital Stay (HOSPITAL_COMMUNITY)
Admission: EM | Admit: 2017-07-24 | Discharge: 2017-07-26 | DRG: 638 | Disposition: A | Discharge status: Home / Self Care | Payer: Self-pay | Attending: Internal Medicine | Admitting: Internal Medicine

## 2017-07-24 ENCOUNTER — Emergency Department (HOSPITAL_COMMUNITY): Payer: Self-pay

## 2017-07-24 DIAGNOSIS — Z6841 Body Mass Index (BMI) 40.0 and over, adult: Secondary | ICD-10-CM

## 2017-07-24 DIAGNOSIS — E1165 Type 2 diabetes mellitus with hyperglycemia: Secondary | ICD-10-CM

## 2017-07-24 DIAGNOSIS — J45909 Unspecified asthma, uncomplicated: Secondary | ICD-10-CM | POA: Diagnosis present

## 2017-07-24 DIAGNOSIS — F1290 Cannabis use, unspecified, uncomplicated: Secondary | ICD-10-CM

## 2017-07-24 DIAGNOSIS — E1110 Type 2 diabetes mellitus with ketoacidosis without coma: Principal | ICD-10-CM | POA: Diagnosis present

## 2017-07-24 DIAGNOSIS — E860 Dehydration: Secondary | ICD-10-CM

## 2017-07-24 DIAGNOSIS — R000 Tachycardia, unspecified: Secondary | ICD-10-CM | POA: Diagnosis present

## 2017-07-24 DIAGNOSIS — I4510 Unspecified right bundle-branch block: Secondary | ICD-10-CM | POA: Diagnosis present

## 2017-07-24 DIAGNOSIS — H9209 Otalgia, unspecified ear: Secondary | ICD-10-CM

## 2017-07-24 DIAGNOSIS — N179 Acute kidney failure, unspecified: Secondary | ICD-10-CM | POA: Diagnosis present

## 2017-07-24 DIAGNOSIS — K0889 Other specified disorders of teeth and supporting structures: Secondary | ICD-10-CM

## 2017-07-24 DIAGNOSIS — I10 Essential (primary) hypertension: Secondary | ICD-10-CM | POA: Diagnosis present

## 2017-07-24 DIAGNOSIS — Z91011 Allergy to milk products: Secondary | ICD-10-CM

## 2017-07-24 DIAGNOSIS — Z833 Family history of diabetes mellitus: Secondary | ICD-10-CM

## 2017-07-24 DIAGNOSIS — B370 Candidal stomatitis: Secondary | ICD-10-CM | POA: Diagnosis present

## 2017-07-24 DIAGNOSIS — E870 Hyperosmolality and hypernatremia: Secondary | ICD-10-CM | POA: Diagnosis not present

## 2017-07-24 DIAGNOSIS — E119 Type 2 diabetes mellitus without complications: Secondary | ICD-10-CM

## 2017-07-24 DIAGNOSIS — F17210 Nicotine dependence, cigarettes, uncomplicated: Secondary | ICD-10-CM | POA: Diagnosis present

## 2017-07-24 DIAGNOSIS — E871 Hypo-osmolality and hyponatremia: Secondary | ICD-10-CM | POA: Diagnosis present

## 2017-07-24 DIAGNOSIS — E1100 Type 2 diabetes mellitus with hyperosmolarity without nonketotic hyperglycemic-hyperosmolar coma (NKHHC): Secondary | ICD-10-CM

## 2017-07-24 DIAGNOSIS — Z885 Allergy status to narcotic agent status: Secondary | ICD-10-CM

## 2017-07-24 DIAGNOSIS — E739 Lactose intolerance, unspecified: Secondary | ICD-10-CM | POA: Diagnosis present

## 2017-07-24 DIAGNOSIS — Z9181 History of falling: Secondary | ICD-10-CM

## 2017-07-24 HISTORY — DX: Type 2 diabetes mellitus with ketoacidosis without coma: E11.10

## 2017-07-24 LAB — BASIC METABOLIC PANEL
ANION GAP: 12 (ref 5–15)
Anion gap: 9 (ref 5–15)
Anion gap: 9 (ref 5–15)
BUN: 15 mg/dL (ref 6–20)
BUN: 12 mg/dL (ref 6–20)
BUN: 11 mg/dL (ref 6–20)
CALCIUM: 9.1 mg/dL (ref 8.9–10.3)
CO2: 18 mmol/L — ABNORMAL LOW (ref 22–32)
CO2: 18 mmol/L — ABNORMAL LOW (ref 22–32)
CO2: 19 mmol/L — AB (ref 22–32)
CREATININE: 1.2 mg/dL (ref 0.61–1.24)
CREATININE: 1.19 mg/dL (ref 0.61–1.24)
Calcium: 9.9 mg/dL (ref 8.9–10.3)
Calcium: 9.1 mg/dL (ref 8.9–10.3)
Chloride: 112 mmol/L — ABNORMAL HIGH (ref 101–111)
Chloride: 117 mmol/L — ABNORMAL HIGH (ref 101–111)
Chloride: 117 mmol/L — ABNORMAL HIGH (ref 101–111)
Creatinine, Ser: 1.32 mg/dL — ABNORMAL HIGH (ref 0.61–1.24)
GFR calc Af Amer: >60 mL/min (ref >60)
GFR calc Af Amer: >60 mL/min (ref >60)
GFR calc Af Amer: >60 mL/min (ref >60)
GFR calc non Af Amer: >60 mL/min (ref >60)
GLUCOSE: 412 mg/dL — AB (ref 65–99)
Glucose, Bld: 625 mg/dL (ref 65–99)
Glucose, Bld: 344 mg/dL — ABNORMAL HIGH (ref 65–99)
POTASSIUM: 3.8 mmol/L (ref 3.5–5.1)
Potassium: 3.3 mmol/L — ABNORMAL LOW (ref 3.5–5.1)
Potassium: 3.5 mmol/L (ref 3.5–5.1)
Sodium: 142 mmol/L (ref 135–145)
Sodium: 144 mmol/L (ref 135–145)
Sodium: 145 mmol/L (ref 135–145)

## 2017-07-24 LAB — URINALYSIS, ROUTINE W REFLEX MICROSCOPIC
BILIRUBIN URINE: NEGATIVE
Glucose, UA: >=500 mg/dL — AB
HGB URINE DIPSTICK: NEGATIVE
KETONES UR: 5 mg/dL — AB
LEUKOCYTES UA: NEGATIVE
NITRITE: NEGATIVE
PH: 5 (ref 5.0–8.0)
Protein, ur: NEGATIVE mg/dL
SPECIFIC GRAVITY, URINE: 1.027 (ref 1.005–1.030)
Squamous Epithelial / LPF: NONE SEEN

## 2017-07-24 LAB — GLUCOSE, CAPILLARY
GLUCOSE-CAPILLARY: 269 mg/dL — AB (ref 65–99)
GLUCOSE-CAPILLARY: 288 mg/dL — AB (ref 65–99)
GLUCOSE-CAPILLARY: 330 mg/dL — AB (ref 65–99)
GLUCOSE-CAPILLARY: 438 mg/dL — AB (ref 65–99)

## 2017-07-24 LAB — CBC
HEMATOCRIT: 47.5 % (ref 39.0–52.0)
HEMOGLOBIN: 17.5 g/dL — AB (ref 13.0–17.0)
MCH: 28.8 pg (ref 26.0–34.0)
MCHC: 36.8 g/dL — ABNORMAL HIGH (ref 30.0–36.0)
MCV: 78.1 fL (ref 78.0–100.0)
Platelets: 333 10*3/uL (ref 150–400)
RBC: 6.08 MIL/uL — ABNORMAL HIGH (ref 4.22–5.81)
WBC: 9 10*3/uL (ref 4.0–10.5)

## 2017-07-24 LAB — CBG MONITORING, ED
GLUCOSE-CAPILLARY: 588 mg/dL — AB (ref 65–99)
GLUCOSE-CAPILLARY: 466 mg/dL — AB (ref 65–99)
Glucose-Capillary: >600 mg/dL (ref 65–99)
Glucose-Capillary: >600 mg/dL (ref 65–99)
Glucose-Capillary: >600 mg/dL (ref 65–99)

## 2017-07-24 LAB — I-STAT TROPONIN, ED: Troponin i, poc: 0.01 ng/mL (ref 0.00–0.08)

## 2017-07-24 LAB — SALICYLATE LEVEL

## 2017-07-24 MED ORDER — SODIUM CHLORIDE 0.9% FLUSH
3.0000 mL | Freq: Two times a day (BID) | INTRAVENOUS | Status: DC
  Administered 2017-07-25: 3 mL via INTRAVENOUS

## 2017-07-24 MED ORDER — ACETAMINOPHEN 650 MG RE SUPP: 650.0000 mg | Freq: Four times a day (QID) | RECTAL | Status: DC | PRN

## 2017-07-24 MED ORDER — SODIUM CHLORIDE 0.9 % IV SOLN
INTRAVENOUS | Status: DC
  Administered 2017-07-24: 16.2 [IU]/h via INTRAVENOUS
  Filled 2017-07-24 (×3): qty 1

## 2017-07-24 MED ORDER — POTASSIUM CHLORIDE 10 MEQ/100ML IV SOLN
10.0000 meq | INTRAVENOUS | Status: AC
  Administered 2017-07-24 – 2017-07-25 (×5): 10 meq via INTRAVENOUS
  Filled 2017-07-24 (×5): qty 100

## 2017-07-24 MED ORDER — DEXTROSE-NACL 5-0.45 % IV SOLN
INTRAVENOUS | Status: DC
  Administered 2017-07-25: 03:00:00 via INTRAVENOUS

## 2017-07-24 MED ORDER — SODIUM CHLORIDE 0.45 % IV SOLN
INTRAVENOUS | Status: DC
  Administered 2017-07-24 (×2): via INTRAVENOUS

## 2017-07-24 MED ORDER — SODIUM CHLORIDE 0.9 % IV BOLUS (SEPSIS)
1000.0000 mL | Freq: Once | INTRAVENOUS | Status: AC
  Administered 2017-07-24: 1000 mL via INTRAVENOUS

## 2017-07-24 MED ORDER — HEPARIN SODIUM (PORCINE) 5000 UNIT/ML IJ SOLN
5000.0000 [IU] | Freq: Three times a day (TID) | INTRAMUSCULAR | Status: DC
  Administered 2017-07-24 – 2017-07-25 (×3): 5000 [IU] via SUBCUTANEOUS
  Filled 2017-07-24 (×5): qty 1

## 2017-07-24 MED ORDER — IBUPROFEN 800 MG PO TABS
800.0000 mg | ORAL_TABLET | Freq: Four times a day (QID) | ORAL | Status: DC | PRN
  Administered 2017-07-24 – 2017-07-26 (×5): 800 mg via ORAL
  Filled 2017-07-24 (×5): qty 1

## 2017-07-24 MED ORDER — SODIUM CHLORIDE 0.9 % IV BOLUS (SEPSIS)
1000.0000 mL | Freq: Once | INTRAVENOUS | Status: DC
  Administered 2017-07-24: 1000 mL via INTRAVENOUS

## 2017-07-24 MED ORDER — INSULIN ASPART 100 UNIT/ML ~~LOC~~ SOLN: 5.0000 [IU] | Freq: Once | SUBCUTANEOUS | Status: DC

## 2017-07-24 MED ORDER — SODIUM CHLORIDE 0.9 % IV SOLN
INTRAVENOUS | Status: AC
  Administered 2017-07-24: 18:00:00 via INTRAVENOUS

## 2017-07-24 MED ORDER — SODIUM CHLORIDE 0.9 % IV SOLN
INTRAVENOUS | Status: DC
  Administered 2017-07-24: 5.4 [IU]/h via INTRAVENOUS
  Filled 2017-07-24: qty 1

## 2017-07-24 MED ORDER — DEXTROSE-NACL 5-0.45 % IV SOLN: INTRAVENOUS | Status: DC

## 2017-07-24 MED ORDER — SENNOSIDES-DOCUSATE SODIUM 8.6-50 MG PO TABS: 1.0000 | ORAL_TABLET | Freq: Every evening | ORAL | Status: DC | PRN

## 2017-07-24 MED ORDER — SODIUM CHLORIDE 0.9 % IV SOLN
INTRAVENOUS | Status: DC
  Administered 2017-07-24: 18:00:00 via INTRAVENOUS

## 2017-07-24 MED ORDER — PROMETHAZINE HCL 25 MG PO TABS: 12.5000 mg | ORAL_TABLET | Freq: Four times a day (QID) | ORAL | Status: DC | PRN

## 2017-07-24 MED ORDER — INSULIN ASPART 100 UNIT/ML ~~LOC~~ SOLN
10.0000 [IU] | Freq: Once | SUBCUTANEOUS | Status: AC
  Administered 2017-07-24: 10 [IU] via INTRAVENOUS
  Filled 2017-07-24: qty 1

## 2017-07-24 MED ORDER — POTASSIUM CHLORIDE 10 MEQ/100ML IV SOLN: 10.0000 meq | INTRAVENOUS | Status: DC

## 2017-07-24 MED ORDER — POTASSIUM CHLORIDE 10 MEQ/100ML IV SOLN
10.0000 meq | INTRAVENOUS | Status: AC
  Administered 2017-07-24 (×2): 10 meq via INTRAVENOUS
  Filled 2017-07-24 (×3): qty 100

## 2017-07-24 MED ORDER — ACETAMINOPHEN 325 MG PO TABS
650.0000 mg | ORAL_TABLET | Freq: Four times a day (QID) | ORAL | Status: DC | PRN
  Administered 2017-07-24: 650 mg via ORAL
  Filled 2017-07-24: qty 2

## 2017-07-24 MED ORDER — HEPARIN SODIUM (PORCINE) 5000 UNIT/ML IJ SOLN: 5000.0000 [IU] | Freq: Three times a day (TID) | INTRAMUSCULAR | Status: DC

## 2017-07-24 NOTE — H&P (Signed)
[facility] Resident and Acting Intern History and Physical   Date: 07/24/2017               Patient Name:  [name] MRN: [id number]  DOB: Oct 04, 1991 Age / Sex: 26 y.o., male   PCP: Patient, No Pcp Per              Medical Service: Internal Medicine Teaching Service              Attending Physician: Dr. Blanch Media    First Contact: Nida Boatman, MS 4 Pager: [phone]  Second Contact: Dr. Evelene Croon Pager: [phone]  Third Contact Dr. Johnny Bridge Pager: [phone]        After Hours (After 5p/  First Contact Pager: [phone]  weekends / holidays): Second Contact Pager: [phone]   Chief Complaint: "weak"  History of Present Illness:  Pt is a 26 yo M with history of obesity and asthma who presented to ED via EMS due to generalized weakness and chest pain, found to have CBG >600.  During EMS transport, patient received ASA 325, nitro x1, and NS and was found to be afebrile, tachy to the 140s, and hypertensive to 160/110.  Per patient and pt's girlfriend report, prior to 5 days ago pt was in his normal state of health, before becoming groggy and lethargic for the past 3-4 days, sleeping the majority of the day and having difficulty getting out of bed.  He also endorses 3 days of polyuria ("peeing every 15 minutes" per girlfriend), polydipsia, shortness of breath when at rest and with exertion, as well as generalized body weakness resulting in multiple mechanical falls.  He states that symptoms began following a toothache he experienced 5 days ago, for which he took Percocet as well as Ibuprofen and [employer].  He has not been evaluated by a dentist, but says this toothache has caused intermittent pain for the past couple years.  Pt also endorses four days of intermittent chest pain, lasting several minutes, which he describes as "sharp" and non-radiating.  He also endorses generalized abdominal pain but denies any nausea, vomiting, or diarrhea. Pt denies any polyphagia, in fact stating  that his appetite has decreased recently.  He denies any noticeable weight loss.  Pt feels that he has been spiking a fever for the past several days but did not take his temperature at home.  He also feels that his vision has been blurry for the past 5 days, making it difficult to see far distances.  Finally, he expresses that he has had a sore throat and cough for the past week.  He denies headache, changes in attention and memory, n/v, constipation, diarrhea, wheezing, lower extremity swelling. Pt has never been diagnosed with DM, but says it runs in his mother's side of the family.   Pt smokes marijuana daily and usually smokes 1-2 cigarettes daily, although he has not smoked in the past week due to feeling weak and tired.  Denies any other drug use.   While in the ED, pt was found to have BG of 1564 with AG 18, correct Na ~150, K 4.9, and ketones in urine.  WBC normal at 9.  CBG > 600 x4 .  UA positive for ketones.  CXR unremarkable. EKG showed sinus tachycardia with R BBB; troponins unremarkable.   Meds: Current Facility-Administered Medications  Medication Dose Route Frequency Provider Last Rate Last Dose  . dextrose 5 %-0.45 % sodium chloride infusion   Intravenous Continuous Marlon Pel, PA-C  Stopped at 07/24/17 1204  . insulin regular (NOVOLIN R,HUMULIN R) 100 Units in sodium chloride 0.9 % 100 mL (1 Units/mL) infusion   Intravenous Continuous Neva Seat, Tiffany, PA-C 10.8 mL/hr at 07/24/17 1334 10.8 Units/hr at 07/24/17 1334   Current Outpatient Prescriptions  Medication Sig Dispense Refill  . Aspirin-Salicylamide-Caffeine (BC HEADACHE POWDER PO) Take 1 packet by mouth every 4 (four) hours as needed (for pain).    Marland Kitchen ibuprofen (ADVIL,MOTRIN) 200 MG tablet Take 600 mg by mouth every 6 (six) hours as needed for mild pain.    Marland Kitchen antipyrine-benzocaine (AURALGAN) otic solution Place 3 drops into the right ear every 2 (two) hours as needed. 10 mL 0  . ibuprofen (ADVIL,MOTRIN) 600 MG tablet  Take 1 tablet (600 mg total) by mouth every 6 (six) hours as needed. 30 tablet 0  . methocarbamol (ROBAXIN) 500 MG tablet Take 1 tablet (500 mg total) by mouth 2 (two) times daily. 20 tablet 0  . naproxen (NAPROSYN) 500 MG tablet Take 1 tablet (500 mg total) by mouth 2 (two) times daily. 20 tablet 0  . oxyCODONE-acetaminophen (PERCOCET/ROXICET) 5-325 MG tablet Take 2 tablets by mouth every 8 (eight) hours as needed for severe pain. 12 tablet 0  . penicillin v potassium (VEETID) 500 MG tablet Take 1 tablet (500 mg total) by mouth 3 (three) times daily. 30 tablet 0    Allergies: Allergies as of 07/24/2017 - Review Complete 07/24/2017  Allergen Reaction Noted  . Vicodin [hydrocodone-acetaminophen] Anaphylaxis 01/22/2014  . Lactose intolerance (gi) Rash 01/22/2014   Past Medical History:  Diagnosis Date  . Asthma   . Obesity    Past Surgical History:  Procedure Laterality Date  . TONSILLECTOMY     No family history on file. Social History   Social History  . Marital status: Single    Spouse name: N/A  . Number of children: N/A  . Years of education: N/A   Occupational History  . Not on file.   Social History Main Topics  . Smoking status: Current Every Day Smoker    Packs/day: 0.10    Types: Cigarettes  . Smokeless tobacco: Never Used  . Alcohol use No  . Drug use: Yes    Types: Marijuana     Comment: daily  . Sexual activity: Not on file   Other Topics Concern  . Not on file   Social History Narrative  . No narrative on file    Review of Systems: Pertinent items noted in HPI and remainder of comprehensive ROS otherwise negative.  Physical Exam: Blood pressure (!) 145/92, pulse (!) 135, temperature 98.2 F (36.8 C), temperature source Oral, resp. rate 18, height  (1.676 m), weight (!) 138.3 kg (305 lb), SpO2 100 %. BP (!) 146/115   Pulse (!) 153   Temp 98.2 F (36.8 C) (Oral)   Resp (!) 21   Ht  (1.676 m)   Wt (!) 138.3 kg (305 lb)   SpO2 99%    BMI 49.23 kg/m   General Appearance:    Alert but lethargic, gives poor effort when answering questions, no acute distress, appears older than stated age  Head:    Normocephalic, without obvious abnormality, atraumatic  Eyes:    PERRL, conjunctiva/corneas clear  Throat:   Lips, mucosa, and tongue normal; teeth and gums normal  Neck:   Supple, symmetrical, trachea midline, no adenopathy;       thyroid:  No enlargement/tenderness/nodules; no JVD  Lungs:     Clear  to auscultation bilaterally, respirations unlabored.  No wheezing or rhonchi.   Heart:    Tachycardic, regular rhythm; S1 and S2 normal, no murmur, rub   or gallop  Abdomen:     Obese.  Soft, non-tender, bowel sounds active all four quadrants, no masses, no organomegaly  Extremities:   Extremities normal, atraumatic, no cyanosis or edema  Pulses:   2+ radial and DP pulses bilaterally   Skin:   Skin color, texture, turgor normal, no rashes or lesions  Lymph nodes:   Cervical, supraclavicular, and axillary nodes normal  Neurologic:   Normal strength, sensation, and motor function in all 4 extremities.  Oriented x3.     Lab results: CBC    Component Value Date/Time   WBC 9.0 07/24/2017 1103   RBC 6.08 (H) 07/24/2017 1103   HGB 17.5 (H) 07/24/2017 1103   HCT 47.5 07/24/2017 1103   PLT 333 07/24/2017 1103   MCV 78.1 07/24/2017 1103   MCH 28.8 07/24/2017 1103   MCHC 36.8 (H) 07/24/2017 1103   RDW NOT CALCULATED 07/24/2017 1103   BMP Latest Ref Rng & Units 07/24/2017  Glucose 65 - 99 mg/dL 1,610(RU)  BUN 6 - 20 mg/dL 04(V)  Creatinine 4.09 - 1.24 mg/dL 8.11(B)  Sodium 147 - 829 mmol/L 122(L)  Potassium 3.5 - 5.1 mmol/L 4.9  Chloride 101 - 111 mmol/L 91(L)  CO2 22 - 32 mmol/L 13(L)  Calcium 8.9 - 10.3 mg/dL 9.1    CBG (last 3)   Recent Labs  07/24/17 1432 07/24/17 1538 07/24/17 1658  GLUCAP >600* >600* >600*    Imaging results:  Dg Chest 2 View  Result Date: 07/24/2017 CLINICAL DATA:  Chest pain and weakness and  malaise for the past 3 days. History of asthma. Current smoker. EXAM: CHEST  2 VIEW COMPARISON:  Chest x-ray of July 21, 2016 FINDINGS: The lungs are borderline hypoinflated but clear. The heart and pulmonary vascularity are normal. The mediastinum is normal in width. There is no pleural effusion. The bony thorax exhibits no acute abnormality. IMPRESSION: There is no active cardiopulmonary disease. Electronically Signed   By: David  [place] M.D.   On: 07/24/2017 11:31    Other results: EKG: personally reviewed and shows sinus tachycardia, RBBB.  Assessment & Plan by Problem: Active Problems:   DKA (diabetic ketoacidoses) (HCC)   1. Diabetic ketoacidosis: BG of 1564, anion gap of 18, and ketones in urine.  No signs of infection noted - normal white count and no evidence of UTI. CXR and UA rule out CAP and UTI. THis is a new onset DM causing the DKA/HHS. He received the third liter of normal saline, but given his hypernatremia of 150, we will switch to 1/2NS 350 cc/hr, as Na persistently at 150.   -BMETs q2 hours -Aggressive hydration- currently fluids running at 350 cc/hr. We will check in the room to make sure fluids are actually running at this rate- as in the ER, there was a discrepancy between the orders and the actual running rate in the room. His deficit is currently at 10 L and he has been repleted 3 L- so 350 cc/hr for 12 hours- will give him another 4 L by tomorrow morning. Then 3 Liters tomorrow during the day. -We will monitor the patient's mental status and vita signs closely  - COntinue insulin drip-  When AG closed 2-3 times and patient is able to eat, then start subQ lantus at 0.5 units/kg/day as he is insulin naive- ensure overlap between  insulin drip and the subQ insulin by 2 hours. -We will monitor the patient's potassium closely- He is s/p 6 runs of Kcl at .  -Transition to D5 1/2NS when CBG< 250- running at the same rate.  -diabetes education  Acute Hypernatremia: in  the setting of severe hyperglycemic DKA . Corrected sodium is 150. Patient is currently alert and oriented. The goal is normalization of sodium over the 24 hours- so by 11 AM on 9/26, his sodium should be int he normal range  -giving 1/2NS at 350 cc/hr -BMETs q2 hours --monitor patient's mental status -checking serum osm   Salicylate use: Pt admitting to using BC powder. HE has metabolic acidosis and RR of 39-  -salicylate level is normal  AKI: Cr of 1.9 -giving IV fluids    Diet: NPO Code Status: Full  This is a Psychologist, occupational Note.  The care of the patient was discussed with Dr. Johnny Bridge and the assessment and plan was formulated with their assistance.     Resident Attestation: Williemae Area, MD has seen and examined the patient along with Ms. Mammie Russian. I have reviewed and edited the note to reflect the accuracy and the final plan was discussed with Ms. Mammie Russian, as well as our attending.   Signed  Deneise Lever, MD IMTS [phone]   Signed: Nathaneil Canary, Medical Student 07/24/2017, 1:48 PM

## 2017-07-24 NOTE — ED Notes (Signed)
Pt hard stick, this RN tried twice to get blood previously, Phlebotomy aware of need for more blood.

## 2017-07-24 NOTE — ED Notes (Signed)
IV team at bedside 

## 2017-07-24 NOTE — ED Notes (Signed)
This RN attempted IV access twice without success, Iv team consulted 

## 2017-07-24 NOTE — ED Notes (Signed)
CBG: 588 RN notified 

## 2017-07-24 NOTE — ED Provider Notes (Signed)
[facility]-EMERGENCY DEPT Provider Note   CSN: [id number] Arrival date & time: 07/24/17  1048     History   Chief Complaint Chief Complaint  Patient presents with  . Chest Pain  . Shortness of Breath    HPI [name] is a 26 y.o. male.  HPI   Patient with PMH of obesity and asthma, family hx of diabetes comes to the ER by EMS for weakness. He has not bee feeling well for the past two weeks with increasing generalized weakness. Then the past 4 days has had significant increased thirst, weakness, chest pains. He has had a toothache and ear ache that he has been dealing with and is concerned he may have fever.   On arrival to the ER he is tachycardic into the 140s, he is dry and weak appearing. His CBG is > 600. He has no known hx of diabetes. Denies syncope, cough, dysuria, back pain, headache, fevers  Admits to smoking marijuana often.  Past Medical History:  Diagnosis Date  . Asthma   . Obesity     There are no active problems to display for this patient.   Past Surgical History:  Procedure Laterality Date  . TONSILLECTOMY         Home Medications    Prior to Admission medications   Medication Sig Start Date End Date Taking? Authorizing Provider  Aspirin-Salicylamide-Caffeine (BC HEADACHE POWDER PO) Take 1 packet by mouth every 4 (four) hours as needed (for pain).   Yes [provider]  ibuprofen (ADVIL,MOTRIN) 200 MG tablet Take 600 mg by mouth every 6 (six) hours as needed for mild pain.   Yes [provider]  antipyrine-benzocaine Lyla Son) otic solution Place 3 drops into the right ear every 2 (two) hours as needed. 07/21/15   [name], NP  ibuprofen (ADVIL,MOTRIN) 600 MG tablet Take 1 tablet (600 mg total) by mouth every 6 (six) hours as needed. 07/21/16   [name], PA-C  methocarbamol (ROBAXIN) 500 MG tablet Take 1 tablet (500 mg total) by mouth 2 (two) times daily. 07/21/16   [name], PA-C  naproxen  (NAPROSYN) 500 MG tablet Take 1 tablet (500 mg total) by mouth 2 (two) times daily. 07/21/15   [name], NP  oxyCODONE-acetaminophen (PERCOCET/ROXICET) 5-325 MG tablet Take 2 tablets by mouth every 8 (eight) hours as needed for severe pain. 04/02/17   McDonald, Mia A, PA-C  penicillin v potassium (VEETID) 500 MG tablet Take 1 tablet (500 mg total) by mouth 3 (three) times daily. 07/21/15   [name], NP    Family History No family history on file.  Social History Social History  Substance Use Topics  . Smoking status: Current Every Day Smoker    Packs/day: 0.00    Types: Cigarettes  . Smokeless tobacco: Never Used  . Alcohol use No     Allergies   Vicodin [hydrocodone-acetaminophen] and Lactose intolerance (gi)   Review of Systems Review of Systems Negative ROS aside from pertinent positives and negatives as listed in HPI   Physical Exam Updated Vital Signs BP (!) 149/89 (BP Location: Right Arm)   Pulse (!) 139   Temp 98.2 F (36.8 C) (Oral)   Resp 20   Ht  (1.676 m)   Wt (!) 138.3 kg (305 lb)   SpO2 96%   BMI 49.23 kg/m   Physical Exam  Constitutional: He appears well-developed and well-nourished. He appears distressed.  HENT:  Head: Normocephalic and atraumatic.  Right Ear:  Tympanic membrane and ear canal normal.  Left Ear: Tympanic membrane and ear canal normal.  Nose: Nose normal.  Mouth/Throat: Uvula is midline and oropharynx is clear and moist. Mucous membranes are dry.  Eyes: Pupils are equal, round, and reactive to light.  Neck: Normal range of motion. Neck supple.  Cardiovascular: Normal rate and regular rhythm.   Pulmonary/Chest: Effort normal.  Abdominal: Soft.  No signs of abdominal distention  Musculoskeletal:  No LE swelling  Neurological: He is alert.  Alert, generally weak.  Skin: Skin is warm and dry. No rash noted.  No evidence of non healing wounds.  Nursing note and vitals reviewed.    ED Treatments / Results  Labs (all  labs ordered are listed, but only abnormal results are displayed) Labs Reviewed  BASIC METABOLIC PANEL - Abnormal; Notable for the following:       Result Value   Sodium 122 (*)    Chloride 91 (*)    CO2 13 (*)    BUN 22 (*)    Creatinine, Ser 1.90 (*)    GFR calc non Af Amer 48 (*)    GFR calc Af Amer 56 (*)    Anion gap 18 (*)    All other components within normal limits  URINALYSIS, ROUTINE W REFLEX MICROSCOPIC - Abnormal; Notable for the following:    Color, Urine STRAW (*)    Glucose, UA >=500 (*)    Ketones, ur 5 (*)    Bacteria, UA RARE (*)    All other components within normal limits  CBG MONITORING, ED - Abnormal; Notable for the following:    Glucose-Capillary >600 (*)    All other components within normal limits  CBC  CBG MONITORING, ED  I-STAT TROPONIN, ED    EKG  EKG Interpretation None       Radiology Dg Chest 2 View  Result Date: 07/24/2017 CLINICAL DATA:  Chest pain and weakness and malaise for the past 3 days. History of asthma. Current smoker. EXAM: CHEST  2 VIEW COMPARISON:  Chest x-ray of July 21, 2016 FINDINGS: The lungs are borderline hypoinflated but clear. The heart and pulmonary vascularity are normal. The mediastinum is normal in width. There is no pleural effusion. The bony thorax exhibits no acute abnormality. IMPRESSION: There is no active cardiopulmonary disease. Electronically Signed   By: David  [place] M.D.   On: 07/24/2017 11:31    Procedures Procedures (including critical care time)  Medications Ordered in ED Medications  sodium chloride 0.9 % bolus 1,000 mL (not administered)  sodium chloride 0.9 % bolus 1,000 mL (not administered)  dextrose 5 %-0.45 % sodium chloride infusion ( Intravenous Hold 07/24/17 1204)  insulin regular (NOVOLIN R,HUMULIN R) 100 Units in sodium chloride 0.9 % 100 mL (1 Units/mL) infusion (not administered)     Initial Impression / Assessment and Plan / ED Course  I have reviewed the triage vital signs  and the nursing notes.  Pertinent labs & imaging results that were available during my care of the patient were reviewed by me and considered in my medical decision making (see chart for details).      12:05 pm Patient is in significant DKA with a new diagnosis of diabetes.  Sodium is 122, potassium 4.9, BUN 22, Creatinine 1.90, GFR 48, anion gap 18 CBG >600  He has been given 2 L of fluids and started on the gluco stabilizer. Will call for unassigned admission.  12:16pm: Internal Medicine Residents have agreed for admission.  Final  Clinical Impressions(s) / ED Diagnoses   Final diagnoses:  Diabetic ketoacidosis without coma associated with type 2 diabetes mellitus (HCC)  Diabetes mellitus, new onset [facility])    New Prescriptions New Prescriptions   No medications on file     Marlon Pel, PA-C 07/24/17 1216    Marlon Pel, PA-C 07/24/17 1222    Cardama, Amadeo Garnet, MD 07/26/17 [phone]

## 2017-07-24 NOTE — H&P (Signed)
  Date: 07/24/2017  Patient name: [name]  Medical record number: [id number]  Date of birth: 09/12/1991   I have seen and evaluated [name] and discussed their care with the Residency Team. [name] is a 26 yo man who presents via EMS with CC of 3 days weakness, falls, sleepiness, CP, L upper tooth ache, & ear ache. He has had about a week of increased thirst (6 2L bottles soda a day + juice) and urination. The girlfriend thought he was just ill but as his sxs got worse and he thought it was night when day, she called EMS. He has taken ibuprofen and BC pwdrs plus one left over oxycodone. He denies fever or weight loss.  PMHx : obesity, asthma, no meds Fam Hx + DM in multiple fam members Soc Hx : + cigarettes, + THC, denies other substances, has girlfriend  Vitals:   07/24/17 1630 07/24/17 1700  BP: (!) 143/110 (!) 146/115  Pulse: (!) 145 (!) 153  Resp: (!) 29 (!) 21  Temp:    SpO2: 95% 99%  T max 98.2 Obese, NAD, A&O x3, somewhat slow to respond, simple answers No focal neuro deficits, moving all 4  WBC 9 HgB 17 Na 122 corrects to about 150 K 4.9 Co2 13 Gap 18 Cr 1.9 Glucose 1564 UA - for LE and nitrites  CXR : no abnl  I personally viewed the EKG and confirmed my reading with the official read. Sinus tachy, indet axis, widened QRS  Assessment and Plan: I have seen and evaluated the patient as outlined above. I agree with the formulated Assessment and Plan as detailed in the residents' note, with the following changes: [name] is a 26 yo man with obesity who presents with 3 days of polyuria, polydipsia, weakness, and fatigue who is found to have DKA with a gap of 18, Co2 13, and a corrected NA od 150. He does not have a personal h/o DM but has a fam h/o DM. He has features c/w DKA (metabolic acidosis with gap, mental status not too bad)) but also HHS (3 day onset, severely elevated glucose). Both are tx the same though - aggressive hydration, insulin,  attention to electrolytes, looking for underlying triggers. CXR and UA R/O CAP and UTI respectively. No GI sxs, skin rash, HA. This is likely new onset DM causing the DKA / HHS. He has hypernatremia and looks like is receiving third litre NS. Would then change to 1/2 NS but would have to make final decision about fluid after repeat BMP back.  1. BMP Q 2 hr for at least next several hrs until we est trends with electrolytes 2. Aggressive hydration - antipcipate upwards of 10 L deficit 3. Very close attention tp electrolytes 4. Insulin drip 5. Check salicylate level - taking BC Pwrds doubt his acidosis and tachypnea is 2/2 ASA but doubt can rely on hx. 6. Check serum osm on intital sample 7. inpt DM education   Burns Spain, MD 9/25/20185:34 PM

## 2017-07-24 NOTE — ED Triage Notes (Signed)
Pt comes from home called EMS for cp, generalized weakness x 3 days. CBG 374, no history, BP 160/110. Hr 140 ST. Given 500 NS, 324 aspirin., 1 nitro. CBG here reading high. 20 to left forearm. C/o left tooth ache and ear ache. C/o feeling hot.

## 2017-07-25 ENCOUNTER — Encounter (HOSPITAL_COMMUNITY): Payer: Self-pay | Admitting: General Practice

## 2017-07-25 DIAGNOSIS — E119 Type 2 diabetes mellitus without complications: Secondary | ICD-10-CM

## 2017-07-25 DIAGNOSIS — Z833 Family history of diabetes mellitus: Secondary | ICD-10-CM

## 2017-07-25 DIAGNOSIS — B370 Candidal stomatitis: Secondary | ICD-10-CM

## 2017-07-25 DIAGNOSIS — E669 Obesity, unspecified: Secondary | ICD-10-CM

## 2017-07-25 DIAGNOSIS — I10 Essential (primary) hypertension: Secondary | ICD-10-CM

## 2017-07-25 DIAGNOSIS — R000 Tachycardia, unspecified: Secondary | ICD-10-CM

## 2017-07-25 HISTORY — DX: Essential (primary) hypertension: I10

## 2017-07-25 LAB — BASIC METABOLIC PANEL WITH GFR
Anion gap: 18 — ABNORMAL HIGH (ref 5–15)
BUN: 22 mg/dL — ABNORMAL HIGH (ref 6–20)
CO2: 13 mmol/L — ABNORMAL LOW (ref 22–32)
Calcium: 9.1 mg/dL (ref 8.9–10.3)
Chloride: 91 mmol/L — ABNORMAL LOW (ref 101–111)
Creatinine, Ser: 1.9 mg/dL — ABNORMAL HIGH (ref 0.61–1.24)
GFR calc Af Amer: 56 mL/min — ABNORMAL LOW
GFR calc non Af Amer: 48 mL/min — ABNORMAL LOW
Glucose, Bld: 1564 mg/dL (ref 65–99)
Potassium: 4.9 mmol/L (ref 3.5–5.1)
Sodium: 122 mmol/L — ABNORMAL LOW (ref 135–145)

## 2017-07-25 LAB — COMPREHENSIVE METABOLIC PANEL
ALBUMIN: 3.5 g/dL (ref 3.5–5.0)
ALK PHOS: 109 U/L (ref 38–126)
ALT: 32 U/L (ref 17–63)
AST: 66 U/L — AB (ref 15–41)
Anion gap: 7 (ref 5–15)
BUN: 6 mg/dL (ref 6–20)
CALCIUM: 8.5 mg/dL — AB (ref 8.9–10.3)
CHLORIDE: 115 mmol/L — AB (ref 101–111)
CO2: 22 mmol/L (ref 22–32)
CREATININE: 1.1 mg/dL (ref 0.61–1.24)
GFR calc non Af Amer: >60 mL/min (ref >60)
Glucose, Bld: 180 mg/dL — ABNORMAL HIGH (ref 65–99)
Potassium: 3.7 mmol/L (ref 3.5–5.1)
Sodium: 144 mmol/L (ref 135–145)
Total Bilirubin: 1.2 mg/dL (ref 0.3–1.2)
Total Protein: 6.8 g/dL (ref 6.5–8.1)

## 2017-07-25 LAB — HEMOGLOBIN A1C
HEMOGLOBIN A1C: 10.5 % — AB (ref 4.8–5.6)
MEAN PLASMA GLUCOSE: 254.65 mg/dL

## 2017-07-25 LAB — GLUCOSE, CAPILLARY
GLUCOSE-CAPILLARY: 236 mg/dL — AB (ref 65–99)
GLUCOSE-CAPILLARY: 242 mg/dL — AB (ref 65–99)
GLUCOSE-CAPILLARY: 381 mg/dL — AB (ref 65–99)
GLUCOSE-CAPILLARY: 381 mg/dL — AB (ref 65–99)
Glucose-Capillary: 176 mg/dL — ABNORMAL HIGH (ref 65–99)
Glucose-Capillary: 226 mg/dL — ABNORMAL HIGH (ref 65–99)
Glucose-Capillary: 254 mg/dL — ABNORMAL HIGH (ref 65–99)
Glucose-Capillary: 259 mg/dL — ABNORMAL HIGH (ref 65–99)
Glucose-Capillary: 267 mg/dL — ABNORMAL HIGH (ref 65–99)
Glucose-Capillary: 312 mg/dL — ABNORMAL HIGH (ref 65–99)
Glucose-Capillary: 465 mg/dL — ABNORMAL HIGH (ref 65–99)
Glucose-Capillary: 538 mg/dL (ref 65–99)
Glucose-Capillary: 404 mg/dL — ABNORMAL HIGH (ref 65–99)

## 2017-07-25 LAB — BASIC METABOLIC PANEL
Anion gap: 7 (ref 5–15)
Anion gap: 8 (ref 5–15)
BUN: 6 mg/dL (ref 6–20)
BUN: 5 mg/dL — AB (ref 6–20)
CALCIUM: 8.3 mg/dL — AB (ref 8.9–10.3)
CO2: 21 mmol/L — ABNORMAL LOW (ref 22–32)
CO2: 17 mmol/L — AB (ref 22–32)
CREATININE: 0.97 mg/dL (ref 0.61–1.24)
Calcium: 8.5 mg/dL — ABNORMAL LOW (ref 8.9–10.3)
Chloride: 115 mmol/L — ABNORMAL HIGH (ref 101–111)
Chloride: 111 mmol/L (ref 101–111)
Creatinine, Ser: 1.08 mg/dL (ref 0.61–1.24)
GFR calc Af Amer: >60 mL/min (ref >60)
GFR calc Af Amer: >60 mL/min (ref >60)
GLUCOSE: 317 mg/dL — AB (ref 65–99)
Glucose, Bld: 182 mg/dL — ABNORMAL HIGH (ref 65–99)
Potassium: 3.6 mmol/L (ref 3.5–5.1)
Potassium: 4.1 mmol/L (ref 3.5–5.1)
Sodium: 143 mmol/L (ref 135–145)
Sodium: 136 mmol/L (ref 135–145)

## 2017-07-25 LAB — CBC
HCT: 41.6 % (ref 39.0–52.0)
Hemoglobin: 14.6 g/dL (ref 13.0–17.0)
MCH: 28.5 pg (ref 26.0–34.0)
MCHC: 35.1 g/dL (ref 30.0–36.0)
MCV: 81.1 fL (ref 78.0–100.0)
Platelets: 258 10*3/uL (ref 150–400)
RBC: 5.13 MIL/uL (ref 4.22–5.81)
RDW: 13.1 % (ref 11.5–15.5)
WBC: 11.5 10*3/uL — ABNORMAL HIGH (ref 4.0–10.5)

## 2017-07-25 LAB — HIV ANTIBODY (ROUTINE TESTING W REFLEX): HIV SCREEN 4TH GENERATION: NONREACTIVE

## 2017-07-25 LAB — RAPID URINE DRUG SCREEN, HOSP PERFORMED
Amphetamines: NOT DETECTED
BARBITURATES: NOT DETECTED
Benzodiazepines: NOT DETECTED
Cocaine: NOT DETECTED
Opiates: NOT DETECTED
Tetrahydrocannabinol: NOT DETECTED

## 2017-07-25 MED ORDER — LIVING WELL WITH DIABETES BOOK
Freq: Once | Status: AC
  Administered 2017-07-25: 15:00:00
  Filled 2017-07-25: qty 1

## 2017-07-25 MED ORDER — INSULIN ASPART 100 UNIT/ML ~~LOC~~ SOLN
0.0000 [IU] | Freq: Three times a day (TID) | SUBCUTANEOUS | Status: DC
  Filled 2017-07-25: qty 0.09

## 2017-07-25 MED ORDER — LISINOPRIL 10 MG PO TABS
20.0000 mg | ORAL_TABLET | Freq: Every day | ORAL | Status: DC
  Administered 2017-07-25 – 2017-07-26 (×2): 20 mg via ORAL
  Filled 2017-07-25 (×2): qty 2

## 2017-07-25 MED ORDER — INSULIN GLARGINE 100 UNIT/ML ~~LOC~~ SOLN
30.0000 [IU] | Freq: Every day | SUBCUTANEOUS | Status: DC
  Administered 2017-07-25: 30 [IU] via SUBCUTANEOUS
  Filled 2017-07-25: qty 0.3

## 2017-07-25 MED ORDER — INSULIN GLARGINE 100 UNIT/ML ~~LOC~~ SOLN: 20.0000 [IU] | Freq: Every day | SUBCUTANEOUS | Status: DC

## 2017-07-25 MED ORDER — INSULIN ASPART 100 UNIT/ML ~~LOC~~ SOLN
0.0000 [IU] | Freq: Three times a day (TID) | SUBCUTANEOUS | Status: DC
  Administered 2017-07-25: 7 [IU] via SUBCUTANEOUS
  Administered 2017-07-25: 3 [IU] via SUBCUTANEOUS

## 2017-07-25 MED ORDER — INSULIN ASPART 100 UNIT/ML ~~LOC~~ SOLN: 0.0000 [IU] | Freq: Three times a day (TID) | SUBCUTANEOUS | Status: DC

## 2017-07-25 MED ORDER — NYSTATIN 100000 UNIT/ML MT SUSP
5.0000 mL | Freq: Four times a day (QID) | OROMUCOSAL | Status: DC
  Administered 2017-07-25 – 2017-07-26 (×6): 500000 [IU] via ORAL
  Filled 2017-07-25 (×7): qty 5

## 2017-07-25 MED ORDER — POTASSIUM CHLORIDE 10 MEQ/100ML IV SOLN
10.0000 meq | Freq: Once | INTRAVENOUS | Status: AC
  Administered 2017-07-25: 10 meq via INTRAVENOUS
  Filled 2017-07-25: qty 100

## 2017-07-25 MED ORDER — INSULIN ASPART 100 UNIT/ML ~~LOC~~ SOLN
15.0000 [IU] | Freq: Once | SUBCUTANEOUS | Status: AC
  Administered 2017-07-25: 15 [IU] via SUBCUTANEOUS

## 2017-07-25 MED ORDER — INSULIN ASPART 100 UNIT/ML ~~LOC~~ SOLN
0.0000 [IU] | Freq: Every day | SUBCUTANEOUS | Status: DC
  Administered 2017-07-25: 5 [IU] via SUBCUTANEOUS

## 2017-07-25 MED ORDER — INSULIN GLARGINE 100 UNIT/ML ~~LOC~~ SOLN
20.0000 [IU] | Freq: Every day | SUBCUTANEOUS | Status: DC
  Administered 2017-07-25: 20 [IU] via SUBCUTANEOUS
  Filled 2017-07-25: qty 0.2

## 2017-07-25 MED ORDER — SODIUM CHLORIDE 0.9 % IV SOLN
INTRAVENOUS | Status: DC
  Administered 2017-07-25: 08:00:00 via INTRAVENOUS

## 2017-07-25 MED ORDER — INSULIN ASPART 100 UNIT/ML ~~LOC~~ SOLN
0.0000 [IU] | Freq: Three times a day (TID) | SUBCUTANEOUS | Status: DC
  Administered 2017-07-26 (×2): 15 [IU] via SUBCUTANEOUS
  Administered 2017-07-26: 20 [IU] via SUBCUTANEOUS

## 2017-07-25 NOTE — Progress Notes (Addendum)
Inpatient Diabetes Program Recommendations  AACE/ADA: New Consensus Statement on Inpatient Glycemic Control (2015)  Target Ranges:  Prepandial:   less than 140 mg/dL      Peak postprandial:   less than 180 mg/dL (1-2 hours)      Critically ill patients:  140 - 180 mg/dL   Spoke with patient about new diabetes diagnosis. Spoke with patient's mom as well over Facetime about her DM. Patient's mother is a mix between two different types of DM. Ever since a gallstone that obstructed her pancreas she sometimes makes insulin and sometimes not. She is on both Levemir and Novolog.  Patient just quit his job and does not have insurance. 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 Discussed A1C results with patient (10.5%) and explained what an A1C is. Discussed basic pathophysiology of DM Type 2, basic home care, importance of checking CBGs and maintaining good CBG control to prevent long-term and short-term complications. Reviewed glucose and A1C goals and explained that patient will need to continue to  Reviewed signs and symptoms of hyperglycemia and hypoglycemia along with treatment for both. Discussed impact of nutrition, exercise, stress, sickness, and medications on diabetes control. Reviewed Living Well with diabetes booklet and encouraged patient to read through entire book. Informed patient that he may be prescribed either Lantus or 70/30 vial and syringe. Showed patient vial and syringe in addition to insulin pen routes for home insulin dosing. Informed patient that insulin can be purchased at [facility] for $25 per vial. Will provide patient with handout information on Reli-On products and encouraged patient to go to [employer] to get the Reli-On Prime glucometer for $9 and a box of 50 Reli-On test strips for $9.  Asked patient to check his glucose 2-4 times per day (before meals and at bedtime) and to keep a log book of glucose readings and insulin taken. Explained how the doctor he follows up  with can use the log book to continue to make insulin adjustments if needed. Patient verbalized understanding of information discussed and he states that he has no further questions at this time related to diabetes. RNs to provide ongoing basic DM education at bedside with this patient and engage patient to actively check blood glucose and administer insulin injections.   Will see patient again on 9/27 mid to later morning before potential discharge.  Thanks, Christena Deem RN, MSN, [facility] Inpatient Diabetes Coordinator Team Pager [phone] (8a-5p)

## 2017-07-25 NOTE — Progress Notes (Signed)
  Date: 07/25/2017  Patient name: [name]  Medical record number: [id number]  Date of birth: [birth year]   I have seen and evaluated this patient and I have discussed the plan of care with the house staff. Please see their note for complete details. I concur with their findings with the following additions/corrections: Mr Nephew was seen on AM rounds. Feeling better except sore throat. HAs thrush on hard palate. Agree with nystatin. As he is a new diabetes diagnosis, we will need intensive diabetes education starting inpatient. Likely stable to go home tomorrow on Lantus and metformin. He has also been started on lisinopril for a consistently elevated blood pressure. He will follow-up in [facility]  Burns Spain, MD 07/25/2017, 2:57 PM

## 2017-07-25 NOTE — Progress Notes (Signed)
Patient with a CBG 465. IMTS notified. New orders received. Unit CN administered 15 units novolog per MD. Will recheck in 30 minutes.  Leanna Battles, RN

## 2017-07-25 NOTE — Progress Notes (Signed)
The night team was asked to evaluate [name] this evening. Upon entering the room he is sitting comfortably in bed laughing at the TV and conversing with his significant other. He says that his symptoms of nausea, headache, and lethargy have improved significantly and inquires whether he will be able to eat soon. Telemetry shows tachycardia 110s, he has no increase in work of breathing.   We had been following BMP every 2 hours, the last one 9pm showed a corrected sodium 150 and corrected gap of 14. The gap had been 15 on check at 7:30. Unfortunately, there was an error with the BMP order being cancelled due to it being read as a duplicate and we have not had a result since that time.  Regarding the urine output- IV fluids are running at a high rate for his concerning hypernatremia so we are monitoring urine output closely. Intake and output measured in the chart show 1.2 liters of urine output. [name] reports good urine output. Not all of the urine had been recorded (pt disposed some in the ED) however since midnight the urine output has been recorded.   - CBG is 176 now, will switch from 1/2 NS to 1/2NS with D5.  - Anion gap has been recorded as closed x1. I have ordered a BMP STAT, if the gap is closed on the next BMP we can hope that is has been closed for the past 6 hours and begin the transition off of insulin drip - Hypernatremia is still a concern, will continue correction with aggressive IV fluid rehydration and monitoring with BMP  - Will confirm correction of potassium on BMP  - continue intake and output monitoring

## 2017-07-25 NOTE — Progress Notes (Signed)
Patient c/o a very sore throat that is causing issues with eating. RN unable to visualize anything in the back of his throat. Currently already receiving scheduled nystatin. Jill Side, MS4 notified.  Leanna Battles, RN

## 2017-07-25 NOTE — Progress Notes (Signed)
Inpatient Diabetes Program Recommendations  AACE/ADA: New Consensus Statement on Inpatient Glycemic Control (2015)  Target Ranges:  Prepandial:   less than 140 mg/dL      Peak postprandial:   less than 180 mg/dL (1-2 hours)      Critically ill patients:  140 - 180 mg/dL   Lab Results  Component Value Date   GLUCAP 312 (H) 07/25/2017    Review of Glycemic Control  Diabetes history: New diagnosis  Inpatient Diabetes Program Recommendations:    Increase Lantus to 30 units (0.2 units/kg). Lack of insurance might need to transition to 70/30 insulin unless patient will follow up in clinic.  A1c in process  Thanks,  Christena Deem RN, MSN, [facility] Inpatient Diabetes Coordinator Team Pager [phone] (8a-5p)

## 2017-07-25 NOTE — Plan of Care (Signed)
Problem: Health Behavior: Goal: Ability to identify and utilize available resources and services will improve Outcome: Progressing Diabetic coordinator met with patient this afternoon.

## 2017-07-25 NOTE — Progress Notes (Addendum)
Subjective: Pt is feeling "alright" this morning, says many of his symptoms have improved.  He feels less lethargic and weak, denies SOB and CP.  No abdominal pain, n/v.  Pt is complaining of a sore throat, denies runny nose or cough.  No other complaints.  He says he is "in his feelings" about the new diagnosis of DM.  He states he has a strong family history of DM (unsure of type I or II).  Denies any prior history of HTN.  He is looking forward to meeting with the diabetes educator and prefers to stay one more night in the hospital to ensure that his medication regimen is titrated correctly and he has time to have his questions answered.  He does not have a PCP and is interested in establishing care with the IM Clinic at [facility].   Objective: Vital signs in last 24 hours: Vitals:   07/25/17 0115 07/25/17 0338 07/25/17 0805 07/25/17 1122  BP: (!) 145/76 (!) 149/79 (!) 161/86 136/76  Pulse: (!) 116 (!) 103 100 84  Resp: (!) 30 (!) 103 (!) 22 (!) 30  Temp: 98.7 F (37.1 C) 98.8 F (37.1 C) 97.9 F (36.6 C) 97.8 F (36.6 C)  TempSrc: Oral Oral Oral Oral  SpO2: 96% 96% 98% 98%  Weight:      Height:       General: Obese male, AOx3, cooperative and much more interactive than yesterday - engaging and able to answer questions in complete sentences.  HEENT: Thrush in back of mouth. Respiratory: CTAB.  No wheezes or rhonci. CV: Regular rate and rhythm.  Normal S1, S2.  No murmurs appreciated. Abdomen: Obese, soft, nondistended.  Normoactive bowel sounds. Extremities: No lower extremity edema.   Neuro: Able to move all 4 limbs spontaneously.   Assessment/Plan:  DKA vs. HHS - now resolved / DM: Pt presented to ED with BG of 1564, anion gap of 18, and ketones in urine.  Overnight, anion gap closed x 2 and transitioned off insulin gtt to Lantus 20 units. After stopping the insulin gtt, CBGs rose back into 200s-300s. Will increase dose of Lantus to 30 units qhs tonight and SSI-s to moderate. Unclear  whether this is T1 vs. T2DM, as BG>1,000, pt's age and lifestyle, family history point towards T2DM/HHS.  However, acidosis and ketonuria point towards T1DM. Will send GAD Ab, C-peptide level to clarify, likely will result after discharge to follow up outpatient. Will need to continue insulin at discharge and will start metformin at discharge as well. Will get CM, DM educator involved.  -Increase Lantus from 20 units -->30 units -Discontinue NS as pt appears euvolemic on exam and has good PO intake.   -Discontinue cardiac monitoring.   -Ordering HbA1c, GAD Ab, C-peptide levels  -Ordered UDS  -DM educator to meet with patient    Hypertonic Hyponatremia 2/2 Hyperglycemia: Resolved  HTN: Pt presented to ED with BP 150s/110s.  BP persistently elevated despite resolution of DKA/HHS - current BP 149/79.   -Start lisinopril  for HTN and renal protection   Candida thrush:  Pt has thrush in the back of his mouth.  Denies any issues swallowing thought dose note pain with swallowing foods but not liquids. HIV on admission was negative. Unlikely that has progressed to Candida esophagitis -Start nystatin swish-and-swallow QID   Diet: Carb modified diet  PPX: Heparin subQ q8h Code Status: Full Dispo: Plan for d/c tomorrow, with f/u with [facility].    This is a Psychologist, occupational  Note.  The care of the patient was discussed with Dr. Valentino Nose and the assessment and plan formulated with their assistance.  Please see their attached note for official documentation of the daily encounter.   LOS: 0 days   Nathaneil Canary, Medical Student 07/25/2017, 1:27 PM    I have seen and examined the patient myself, and I have reviewed the note by Nida Boatman, MS IV and was present during the interview and physical exam. I have reviewed and edited the above note as appropriate. Agree with above.   Signed: Valentino Nose, MD 07/25/2017, 2:45 PM

## 2017-07-25 NOTE — Progress Notes (Signed)
Met with patient at request of Internal Medicine inpatient team. Provided samples of insulin  and meter with strips. Requested that he be sure nurse shows him how to use both insulin pen and vial and syringe. In addition requested that he meet with our pharmacist when he comes for a hospital follow up.  [name], Butch Penny, [place] 07/25/2017 6:40 PM.

## 2017-07-26 DIAGNOSIS — B3781 Candidal esophagitis: Secondary | ICD-10-CM

## 2017-07-26 DIAGNOSIS — Z6841 Body Mass Index (BMI) 40.0 and over, adult: Secondary | ICD-10-CM

## 2017-07-26 LAB — BASIC METABOLIC PANEL
Anion gap: 10 (ref 5–15)
BUN: 5 mg/dL — AB (ref 6–20)
CO2: 21 mmol/L — ABNORMAL LOW (ref 22–32)
Calcium: 8.5 mg/dL — ABNORMAL LOW (ref 8.9–10.3)
Chloride: 105 mmol/L (ref 101–111)
Creatinine, Ser: 0.85 mg/dL (ref 0.61–1.24)
GFR calc Af Amer: >60 mL/min (ref >60)
Glucose, Bld: 344 mg/dL — ABNORMAL HIGH (ref 65–99)
Potassium: 4.3 mmol/L (ref 3.5–5.1)
Sodium: 136 mmol/L (ref 135–145)

## 2017-07-26 LAB — GLUCOSE, CAPILLARY
GLUCOSE-CAPILLARY: 354 mg/dL — AB (ref 65–99)
Glucose-Capillary: 405 mg/dL — ABNORMAL HIGH (ref 65–99)
Glucose-Capillary: 337 mg/dL — ABNORMAL HIGH (ref 65–99)
Glucose-Capillary: 309 mg/dL — ABNORMAL HIGH (ref 65–99)

## 2017-07-26 MED ORDER — INSULIN GLARGINE 100 UNIT/ML ~~LOC~~ SOLN
40.0000 [IU] | Freq: Every day | SUBCUTANEOUS | 3 refills | Status: DC
Start: 2017-07-26 — End: 2017-07-26

## 2017-07-26 MED ORDER — INSULIN ASPART 100 UNIT/ML ~~LOC~~ SOLN: 10.0000 [IU] | Freq: Three times a day (TID) | SUBCUTANEOUS | 4 refills | Status: DC

## 2017-07-26 MED ORDER — NYSTATIN 100000 UNIT/ML MT SUSP: 5.0000 mL | Freq: Four times a day (QID) | OROMUCOSAL | 0 refills | Status: DC

## 2017-07-26 MED ORDER — INSULIN GLARGINE 100 UNIT/ML ~~LOC~~ SOLN: 40.0000 [IU] | Freq: Every day | SUBCUTANEOUS | 3 refills | Status: DC

## 2017-07-26 MED ORDER — LISINOPRIL 20 MG PO TABS: 20.0000 mg | ORAL_TABLET | Freq: Every day | ORAL | 0 refills | Status: DC

## 2017-07-26 MED ORDER — INSULIN ASPART 100 UNIT/ML ~~LOC~~ SOLN
10.0000 [IU] | Freq: Three times a day (TID) | SUBCUTANEOUS | Status: DC
  Administered 2017-07-26 (×2): 10 [IU] via SUBCUTANEOUS

## 2017-07-26 MED ORDER — INSULIN GLARGINE 100 UNIT/ML ~~LOC~~ SOLN
15.0000 [IU] | Freq: Once | SUBCUTANEOUS | Status: AC
  Administered 2017-07-26: 15 [IU] via SUBCUTANEOUS
  Filled 2017-07-26: qty 0.15

## 2017-07-26 MED ORDER — METFORMIN HCL 500 MG PO TABS: 500.0000 mg | ORAL_TABLET | Freq: Every day | ORAL | 0 refills | Status: DC

## 2017-07-26 MED ORDER — INSULIN GLARGINE 100 UNIT/ML ~~LOC~~ SOLN
40.0000 [IU] | Freq: Every day | SUBCUTANEOUS | Status: DC
  Filled 2017-07-26: qty 0.4

## 2017-07-26 NOTE — Progress Notes (Signed)
Patient has been very resistant/hesitant to learn about diabetes or administering insulin. He's been stating his mom and sister know "everything there is to know" and they would help him. This RN informed him he would need to watch the diabetes videos before being discharged. Patient was agreeable and allowed RN to put one of the videos on the tv (Managing your diabetes). Approx 30 minutes later, this RN entered the room to find patient watching another diabetic video (Reducing fears about injections). At this time he was agreeable to attempt to administer his own injection - he self administered sq lantus without difficulty and stated "It wasn't as bad as I thought." He states he wants to start exercising and losing weight. Girlfriend arrived back to the room and patient proudly told her he had given himself an injection. Stated they'll continue to watch more videos. Will monitor.  Leanna Battles, RN

## 2017-07-26 NOTE — Discharge Summary (Signed)
Name: [name] MRN: [id number] DOB: [birth year] 26 y.o. PCP: Patient, No Pcp Per   Date of Admission: 07/24/2017 11:08 AM Date of Discharge: 07/26/2017 Attending Physician: Bartholomew Crews, MD  Discharge Diagnosis:  1. Diabetes 2. Hypertension 3. Candida thrush   Discharge Medications: Allergies as of 07/26/2017      Reactions   Vicodin [hydrocodone-acetaminophen] Anaphylaxis   Eggs Or Egg-derived Products Nausea And Vomiting   Lactose Intolerance (gi) Rash      Medication List    STOP taking these medications   antipyrine-benzocaine OTIC solution Commonly known as:  AURALGAN   BC HEADACHE POWDER PO   methocarbamol 500 MG tablet Commonly known as:  ROBAXIN   naproxen 500 MG tablet Commonly known as:  NAPROSYN   oxyCODONE-acetaminophen 5-325 MG tablet Commonly known as:  PERCOCET/ROXICET   penicillin v potassium 500 MG tablet Commonly known as:  VEETID     TAKE these medications   ibuprofen 600 MG tablet Commonly known as:  ADVIL,MOTRIN Take 1 tablet (600 mg total) by mouth every 6 (six) hours as needed. What changed:  Another medication with the same name was removed. Continue taking this medication, and follow the directions you see here.   insulin aspart 100 UNIT/ML injection Commonly known as:  novoLOG Inject 10 Units into the skin 3 (three) times daily with meals. [facility] program   insulin glargine 100 UNIT/ML injection Commonly known as:  LANTUS Inject 0.4 mLs (40 Units total) into the skin at bedtime. [facility] Program   lisinopril 20 MG tablet Commonly known as:  PRINIVIL,ZESTRIL Take 1 tablet (20 mg total) by mouth daily.   metFORMIN 500 MG tablet Commonly known as:  GLUCOPHAGE Take 1 tablet (500 mg total) by mouth daily with breakfast.   nystatin 100000 UNIT/ML suspension Commonly known as:  MYCOSTATIN Take 5 mLs (500,000 Units total) by mouth 4 (four) times daily.            Discharge Care Instructions        Start     Ordered     07/27/17 0000  lisinopril (PRINIVIL,ZESTRIL) 20 MG tablet  Daily     07/26/17 1141   07/26/17 0000  insulin aspart (NOVOLOG) 100 UNIT/ML injection  3 times daily with meals     07/26/17 1141   07/26/17 0000  insulin glargine (LANTUS) 100 UNIT/ML injection  Daily at bedtime    Comments:  (can substitute lantus for levemir)   07/26/17 1141   07/26/17 0000  nystatin (MYCOSTATIN) 100000 UNIT/ML suspension  4 times daily     07/26/17 1141   07/26/17 0000  Increase activity slowly     07/26/17 1141   07/26/17 0000  Diet - low sodium heart healthy     07/26/17 1141   07/26/17 0000  metFORMIN (GLUCOPHAGE) 500 MG tablet  Daily with breakfast     07/26/17 1141      Disposition and follow-up:   [name] was discharged from [facility] in Good condition.  At the hospital follow up visit please address:  1.  Newly-diagnosed diabetes: Pt presented in DKA vs. HHS, was stabilized in the hospital but continued to have CBGs in the 300-500 range throughout hospitalization despite administration of SSI.  HbA1c 10.5%.  He was discharged on Lantus 40u nightly and Novolog 10u before meals.  Also discharged on metformin 543m daily.  Please follow-up on blood sugars and results of C-peptide/GAD testing are negative .  Pt also diagnosed with HTN and  started on lisinopril 60m daily - please follow-up on BPs. Also observed to have Candida thrush and started on nystatin swish-and-swallow.  He was complaining of throat pain, so if pain persists may consider diflucan for possible Candida esophagitis.    2.  Labs / imaging needed at time of follow-up: consider obtaining baseline lipid panel    3.  Pending labs/ test needing follow-up:.   Follow-up Appointments: Internal Medicine Clinic on Monday, 10/1 at 10:45am.    Hospital Course by problem list:  DKA vs. HHS - now resolved / DM: Pt is a 89yoM with no PMH of DM who presented to the ED via EMS for 3 days of polyuria, polydipsia,  SOB, fatigue, and generalized weakness who was found to have a BG of 1564, anion gap of 18, and ketones in urine.  No signs of infection noted - normal white count and no evidence of UTI. CXR and UA rule out CAP and UTI. Pt was treated for new-onset DM causing DKA/HHS. He was repleted with NS and insulin gtt.  NS was transitioned to 1/2NS as pt was hypernatremic to the 150s, which resolved overnight.  During his first night in the hospital, anion gap closed x 2 and pt was transitioned off insulin gtt to Lantus 20 units. After stopping the insulin gtt, CBGs rose back into 200s-300s.  Lantus was increased to 30u nightly with SSI-m as CBGs remained in the 200-300 range.  Despite this change, CBGs continued to remain elevated (300s-500s) on Hospital Day 2, so patient was started on Novolog 10 units with meals and Lantus was increased to 40 units nightly.  During hospitalization, HbA1c was found to be 10.5%.  He met with DM educators throughout hospitalization and he was able to self-administer insulin prior to discharge.  He will follow-up with the IM clinic for further management of DM.   Unclear whether this is T1 vs. T2DM, as BG>1,000, pt's age and lifestyle, family history point towards T2DM/HHS.  However, acidosis and ketonuria point towards T1DM.  C-peptide and GAD Ab are pending and may be helpful in discerning Type 1 vs. Type 2 DM.  At discharge, pt should take Lantus 40 units nightly, Novolog 10 units with meals, metformin 5063mdaily, and should check his BG nightly and before meals. He was provided with a glucose meter and strips, syringes, Lantus, and Novolog.     HTN:Pt presented to ED with BP 150s/110s. BP persistently elevated despite resolution of DKA/HHS.  Pt has never been diagnosed with HTN in the past.  Started lisinopril 2053mhile in the hospital, which should be continued as an outpatient.  Candida thrush:Pt complained of a sore throat during hospitalization and was found to have thrush  in the back of his mouth.  Started nystatin swish-and-swallow QID, which should be continued upon discharge.  Can follow-up with PCP if symptoms persist - may want to consider diflucan if sore throat does not resolve.    Discharge Vitals:   BP (!) 147/63 (BP Location: Left Arm)   Pulse 89   Temp 97.7 F (36.5 C) (Oral)   Resp (!) 30   Ht '5\' 6"'  (1.676 m)   Wt (!) 144.9 kg (319 lb 7.1 oz)   SpO2 98%   BMI 51.56 kg/m   Pertinent Labs, Studies, and Procedures:  CBG (last 3)   Recent Labs  07/25/17 2327 07/26/17 0320 07/26/17 0836  GLUCAP 404* 354* 405*    BMP Latest Ref Rng & Units 07/26/2017 07/25/2017 07/25/2017  Glucose 65 - 99 mg/dL 344(H) 317(H) 180(H)  BUN 6 - 20 mg/dL 5(L) 5(L) 6  Creatinine 0.61 - 1.24 mg/dL 0.85 0.97 1.10  Sodium 135 - 145 mmol/L 136 136 144  Potassium 3.5 - 5.1 mmol/L 4.3 4.1 3.7  Chloride 101 - 111 mmol/L 105 111 115(H)  CO2 22 - 32 mmol/L 21(L) 17(L) 22  Calcium 8.9 - 10.3 mg/dL 8.5(L) 8.3(L) 8.5(L)    CBC Latest Ref Rng & Units 07/25/2017 07/24/2017  WBC 4.0 - 10.5 K/uL 11.5(H) 9.0  Hemoglobin 13.0 - 17.0 g/dL 14.6 17.5(H)  Hematocrit 39.0 - 52.0 % 41.6 47.5  Platelets 150 - 400 K/uL 258 333    HbA1c: 10.5%  CXR 9/25: Normal  EKG 9/25: Tachycardia with R BBB  HIV: negative  Discharge Instructions: Discharge Instructions    Diet - low sodium heart healthy    Complete by:  As directed    Increase activity slowly    Complete by:  As directed       Signed: Randal Buba, Medical Student 07/26/2017, 11:47 AM   Pager: [phone]  I have seen and examined the patient myself, and I have reviewed the note by XYZ,  MS IVand was present during the interview and physical exam. I have reviewed and edited the above note as appropriate. Agree with above.   Signed Burgess Estelle MD IMTS [phone]

## 2017-07-26 NOTE — Care Management Note (Signed)
Case Management Note Donn Pierini RN, BSN Unit 4E-Case Manager [phone]  Patient Details  Name: [name] MRN: [id number] Date of Birth: 11-03-1990  Subjective/Objective:  Pt admitted with DKA                   Action/Plan: PTA pt lived at home- independent-  Plan is for pt to f/u with [facility]- per MD note appointment made for Oct. 1 at 10:45-  Pt has been given glucose meter and some insulin samples- CM will also assist with MATCH for prescriptions given at discharge- spoke with pt at bedside and explained MATCH program- one time use- $3 copay per script- pt given MATCH letter and list of pharmacies to use- as [facility] closes at 6pm - pt will need paper scripts for discharge- bedside RN aware and to call MD for scripts. Pt with no further questions about program and will f/u at clinic for further medication assistance.   Expected Discharge Date:  07/26/17               Expected Discharge Plan:  Home/Self Care  In-House Referral:     Discharge planning Services  CM Consult, Indigent Health Clinic, [facility] Program, Medication Assistance  Post Acute Care Choice:  NA Choice offered to:  NA  DME Arranged:    DME Agency:     HH Arranged:    HH Agency:     Status of Service:  Completed, signed off  If discussed at [employer] of Stay Meetings, dates discussed:    Discharge Disposition: home/self care   Additional Comments:  Darrold Span, RN 07/26/2017, 2:53 PM

## 2017-07-26 NOTE — Progress Notes (Signed)
Patient discharged to home. PIV x1 removed per protocol. Discharge instructions, follow up appts, and medications reviewed with patient. Paper prescriptions and MATCH letter given. Patient states he watched the diabetes video. Patient received Living well with diabetes book and multiple handouts. He has demonstrated administering his insulin x2 today. States his mom has had diabetes for many years and is very knowledgeable/comfortable with it and feels like she'll be a good support person. Answered patient and patient's girlfriend's questions. Patient asked for a letter stating he was in the hospital for work and for court, but then stated he was unable to wait for the letter and left.   Leanna Battles, RN

## 2017-07-26 NOTE — Progress Notes (Signed)
Subjective: Pt is tired this morning and says he has a sore throat that has improved with ibuprofen.  He also endorses some SOB at rest.  He feels ready to leave the hospital today and says the DM educator spoke with him yesterday and brought him a meter as well as Lantus and Novolog.  He does not yet feel comfortable self-administering insulin, but says his mom and girlfriend feel comfortable and will be around before meals and at night to help with administration.  He understands the importance of making a follow-up visit so that we can manage his DM and find the regimen that will work best for him.  His girlfriend mentions they were told he had an abscess in his mouth, but we explained that none was visible on exam and that antibiotics are not indicated as no signs of infection.  Encouraged pt to follow-up with our clinic for a referral to the dental clinic.   Objective: Vital signs in last 24 hours: Vitals:   07/25/17 2320 07/25/17 2330 07/26/17 0330 07/26/17 1015  BP:  (!) 159/106 (!) 150/108 (!) 147/63  Pulse:  (!) 110  89  Resp:      Temp: 97.8 F (36.6 C)  (!) 97.3 F (36.3 C) 97.7 F (36.5 C)  TempSrc: Oral  Oral Oral  SpO2:  98%  98%  Weight:      Height:       Weight change:   Intake/Output Summary (Last 24 hours) at 07/26/17 1118 Last data filed at 07/26/17 0830  Gross per 24 hour  Intake          2441.67 ml  Output             1375 ml  Net          1066.67 ml   General: Obese male, AOx3, less talkative than yesterday  HEENT: Thrush in back of mouth.  No signs of abscess Respiratory: CTAB.  No wheezes or rhonci. CV: Regular rate and rhythm.  Normal S1, S2.  No murmurs appreciated. Abdomen: Obese, soft, nondistended.  Normoactive bowel sounds. Extremities: No lower extremity edema.   Neuro: Able to move all 4 limbs spontaneously.   Lab Results: BMP Latest Ref Rng & Units 07/26/2017 07/25/2017 07/25/2017  Glucose 65 - 99 mg/dL 914(N) 829(F) 621(H)  BUN 6 - 20 mg/dL  5(L) 5(L) 6  Creatinine 0.61 - 1.24 mg/dL 0.86 5.78 4.69  Sodium 135 - 145 mmol/L 136 136 144  Potassium 3.5 - 5.1 mmol/L 4.3 4.1 3.7  Chloride 101 - 111 mmol/L 105 111 115(H)  CO2 22 - 32 mmol/L 21(L) 17(L) 22  Calcium 8.9 - 10.3 mg/dL 6.2(X) 8.3(L) 8.5(L)   HbA1c: 10.5%  CBG (last 3)   Recent Labs  07/25/17 2327 07/26/17 0320 07/26/17 0836  GLUCAP 404* 354* 405*    Medications: I have reviewed the patient's current medications. Scheduled Meds: . heparin  5,000 Units Subcutaneous Q8H  . insulin aspart  0-20 Units Subcutaneous TID WC  . insulin aspart  0-5 Units Subcutaneous QHS  . insulin aspart  10 Units Subcutaneous TID WC  . insulin glargine  40 Units Subcutaneous QHS  . lisinopril  20 mg Oral Daily  . nystatin  5 mL Oral QID   Continuous Infusions: PRN Meds:.acetaminophen **OR** acetaminophen, ibuprofen, promethazine, senna-docusate Assessment/Plan: Active Problems:   DKA (diabetic ketoacidoses) (HCC)   HTN (hypertension)  DKA vs. HHS - now resolved / DM: BG continued to be elevated from 300s-500s despite  30 units Lantus at night and 80 units of SSI given in past 24 hours.  Will plan to give 15u Lantus now and change insulin regimen to 10u Novolog for meal coverage with 40u Lantus nightly.  Will also continue SSI while in the hospital.  Will discharge on metformin  daily.  Pt was provided with glucose meter, Lantus, and Novolog by DM educator.  -Increase Lantus to 40 units nightly.  Will provide 1x 15 units this AM due to high CBGs this AM.  -Start Novolog 10 units before meals.  -Will Rx metformin  daily at discharge  -F/u GAD Ab, C-peptide levels   HTN: Pt presented to ED with BP 150s/110s.  BP persistently elevated despite resolution of DKA/HHS - current BP 147/63.  Would expect lisinopril to take a number of days before seeing lower BP.   -Continue lisinopril  for HTN and renal protection   Candida thrush:  Pt has thrush in the back of his mouth.   Denies any issues swallowing thought dose note pain with swallowing foods but not liquids. HIV on admission was negative. Unlikely that has progressed to Candida esophagitis.  Will make note in discharge instructions for PCP to f/u with complaint and consider diflucan if pain persists.  -Continue nystatin swish-and-swallow QID   Diet: Carb modified diet  PPX: Heparin subQ q8h Code Status: Full Dispo: Plan for d/c today, with f/u with [facility].   This is a Psychologist, occupational Note.  The care of the patient was discussed with Dr. Deneise Lever and the assessment and plan formulated with their assistance.  Please see their attached note for official documentation of the daily encounter.    LOS: 1 day   Nathaneil Canary, Medical Student 07/26/2017, 11:18 AM   Resident Attestation: I have seen and examined the patient myself, and I have reviewed the note by Nida Boatman, MS IV and was present during the interview and physical exam. I have reviewed and edited the above note as appropriate. Agree with above.   Deneise Lever, MD [phone]

## 2017-07-26 NOTE — Progress Notes (Signed)
Inpatient Diabetes Program Recommendations  AACE/ADA: New Consensus Statement on Inpatient Glycemic Control (2015)  Target Ranges:  Prepandial:   less than 140 mg/dL      Peak postprandial:   less than 180 mg/dL (1-2 hours)      Critically ill patients:  140 - 180 mg/dL   Lab Results  Component Value Date   GLUCAP 405 (H) 07/26/2017   HGBA1C 10.5 (H) 07/25/2017   Review of Glycemic Control  Diabetes history: New diagnosis Current orders for Inpatient glycemic control: Lantus 30 units, Novolog Resistant scale 0-20 units + Novolog HS 0-5 units  Inpatient Diabetes Program Recommendations:     Glucose very elevated in the 400's this am. Spoke with Cybill, RN and Nida Boatman with medical team. RN to give Novolog 20 units now. Recommend increasing Lantus to 45 units Daily (0.3 units/kg), give additional Lantus 15 units this am.   Patient reports to me about having a mouth abscess, which could make controlling glucose levels difficult. Spoke with [place] with medical team, will address this am.  Patient to view DM videos today. Has received the DM educational booklet.  Thanks,  Christena Deem RN, MSN, [facility] Inpatient Diabetes Coordinator Team Pager [phone] (8a-5p)

## 2017-07-26 NOTE — Discharge Instructions (Signed)
You were seen in the hospital following an episode of extremely high blood sugar.  You have been diagnosed with diabetes as well as high blood pressure.  You also have thrush in your mouth, which is a common complication of diabetes.  Please takes the medications below as prescribed.  It is very important that you follow-up in the Internal Medicine Clinic on Monday, October 1 at 10:45am so that we can monitor your diabetes and blood pressure, and so we can refer you to the dental clinic.    For diabetes: -Please check your blood sugar before meals and at night before bed, and write down the values so that you can bring them to your follow-up appointment  -Before every meal, take 10 units of Novolog -Before bed every night, take 40 units of Lantus  -Once a day, take metformin  -Follow-up with the Internal Medicine Clinic on Monday, Oct. 1 at 10:45am   For hypertension (high blood pressure):  -Take lisinopril  every day  For thrush (yeast) in mouth:  -Take nystatin swish-and-swallow four-times-per day       Blood Glucose Monitoring, Adult Monitoring your blood sugar (glucose) helps you manage your diabetes. It also helps you and your health care provider determine how well your diabetes management plan is working. Blood glucose monitoring involves checking your blood glucose as often as directed, and keeping a record (log) of your results over time. Why should I monitor my blood glucose? Checking your blood glucose regularly can:  Help you understand how food, exercise, illnesses, and medicines affect your blood glucose.  Let you know what your blood glucose is at any time. You can quickly tell if you are having low blood glucose (hypoglycemia) or high blood glucose (hyperglycemia).  Help you and your health care provider adjust your medicines as needed.  When should I check my blood glucose? Follow instructions from your health care provider about how often to check your blood  glucose. This may depend on:  The type of diabetes you have.  How well-controlled your diabetes is.  Medicines you are taking.  If you have type 1 diabetes:  Check your blood glucose at least 2 times a day.  Also check your blood glucose: ? Before every insulin injection. ? Before and after exercise. ? Between meals. ? 2 hours after a meal. ? Occasionally between 2:00 a.m. and 3:00 a.m., as directed. ? Before potentially dangerous tasks, like driving or using heavy machinery. ? At bedtime.  You may need to check your blood glucose more often, up to 6-10 times a day: ? If you use an insulin pump. ? If you need multiple daily injections (MDI). ? If your diabetes is not well-controlled. ? If you are ill. ? If you have a history of severe hypoglycemia. ? If you have a history of not knowing when your blood glucose is getting low (hypoglycemia unawareness). If you have type 2 diabetes:  If you take insulin or other diabetes medicines, check your blood glucose at least 2 times a day.  If you are on intensive insulin therapy, check your blood glucose at least 4 times a day. Occasionally, you may also need to check between 2:00 a.m. and 3:00 a.m., as directed.  Also check your blood glucose: ? Before and after exercise. ? Before potentially dangerous tasks, like driving or using heavy machinery.  You may need to check your blood glucose more often if: ? Your medicine is being adjusted. ? Your diabetes is not well-controlled. ?  You are ill. What is a blood glucose log?  A blood glucose log is a record of your blood glucose readings. It helps you and your health care provider: ? Look for patterns in your blood glucose over time. ? Adjust your diabetes management plan as needed.  Every time you check your blood glucose, write down your result and notes about things that may be affecting your blood glucose, such as your diet and exercise for the day.  Most glucose meters store a  record of glucose readings in the meter. Some meters allow you to download your records to a computer. How do I check my blood glucose? Follow these steps to get accurate readings of your blood glucose: Supplies needed   Blood glucose meter.  Test strips for your meter. Each meter has its own strips. You must use the strips that come with your meter.  A needle to prick your finger (lancet). Do not use lancets more than once.  A device that holds the lancet (lancing device).  A journal or log book to write down your results. Procedure  Wash your hands with soap and water.  Prick the side of your finger (not the tip) with the lancet. Use a different finger each time.  Gently rub the finger until a small drop of blood appears.  Follow instructions that come with your meter for inserting the test strip, applying blood to the strip, and using your blood glucose meter.  Write down your result and any notes. Alternative testing sites  Some meters allow you to use areas of your body other than your finger (alternative sites) to test your blood.  If you think you may have hypoglycemia, or if you have hypoglycemia unawareness, do not use alternative sites. Use your finger instead.  Alternative sites may not be as accurate as the fingers, because blood flow is slower in these areas. This means that the result you get may be delayed, and it may be different from the result that you would get from your finger.  The most common alternative sites are: ? Forearm. ? Thigh. ? Palm of the hand. Additional tips  Always keep your supplies with you.  If you have questions or need help, all blood glucose meters have a 24-hour hotline number that you can call. You may also contact your health care provider.  After you use a few boxes of test strips, adjust (calibrate) your blood glucose meter by following instructions that came with your meter. This information is not intended to replace  advice given to you by your health care provider. Make sure you discuss any questions you have with your health care provider. Document Released: 10/19/2003 Document Revised: 05/05/2016 Document Reviewed: 03/27/2016 Elsevier Interactive Patient Education  2017 [employer].

## 2017-07-27 LAB — C-PEPTIDE: C-Peptide: 2.5 ng/mL (ref 1.1–4.4)

## 2017-07-27 LAB — GLUTAMIC ACID DECARBOXYLASE AUTO ABS: Glutamic Acid Decarb Ab: <5 U/mL (ref 0.0–5.0)

## 2017-07-28 LAB — OSMOLALITY: OSMOLALITY: 330 mosm/kg — AB (ref 275–295)

## 2017-07-30 ENCOUNTER — Ambulatory Visit: Payer: Self-pay

## 2017-08-01 ENCOUNTER — Encounter: Payer: Self-pay | Admitting: Internal Medicine

## 2017-08-01 ENCOUNTER — Ambulatory Visit (INDEPENDENT_AMBULATORY_CARE_PROVIDER_SITE_OTHER): Payer: Self-pay | Admitting: Internal Medicine

## 2017-08-01 VITALS — BP 147/71 | HR 106 | Temp 98.3°F | Ht 66.0 in | Wt 331.9 lb

## 2017-08-01 DIAGNOSIS — E119 Type 2 diabetes mellitus without complications: Secondary | ICD-10-CM

## 2017-08-01 DIAGNOSIS — E1110 Type 2 diabetes mellitus with ketoacidosis without coma: Secondary | ICD-10-CM

## 2017-08-01 DIAGNOSIS — I10 Essential (primary) hypertension: Secondary | ICD-10-CM

## 2017-08-01 DIAGNOSIS — F17200 Nicotine dependence, unspecified, uncomplicated: Secondary | ICD-10-CM | POA: Insufficient documentation

## 2017-08-01 DIAGNOSIS — IMO0001 Reserved for inherently not codable concepts without codable children: Secondary | ICD-10-CM | POA: Insufficient documentation

## 2017-08-01 DIAGNOSIS — E109 Type 1 diabetes mellitus without complications: Secondary | ICD-10-CM | POA: Insufficient documentation

## 2017-08-01 DIAGNOSIS — F17210 Nicotine dependence, cigarettes, uncomplicated: Secondary | ICD-10-CM

## 2017-08-01 DIAGNOSIS — Z23 Encounter for immunization: Secondary | ICD-10-CM

## 2017-08-01 MED ORDER — INSULIN GLARGINE 100 UNIT/ML ~~LOC~~ SOLN: 45.0000 [IU] | Freq: Every day | SUBCUTANEOUS | 3 refills | Status: DC

## 2017-08-01 MED ORDER — LISINOPRIL 40 MG PO TABS: 40.0000 mg | ORAL_TABLET | Freq: Every day | ORAL | 0 refills | Status: DC

## 2017-08-01 MED ORDER — METFORMIN HCL 500 MG PO TABS: 500.0000 mg | ORAL_TABLET | Freq: Two times a day (BID) | ORAL | 0 refills | Status: DC

## 2017-08-01 NOTE — Assessment & Plan Note (Signed)
BP Readings from Last 3 Encounters:  08/01/17 (!) 147/71  07/26/17 (!) 141/74  07/20/17 (!) 157/98   Patient hypertensive again today- he is compliant with lisinopril 20 mg.   Plan -increase lisinopril to 40 mg daily  -repeat BMET in 2 weeks when he comes for appt

## 2017-08-01 NOTE — Progress Notes (Signed)
    CC: DM, HTN HPI: [name] is a 26 y.o. man with PMH noted below here for follow up of DM and HTN  Please see Problem List/A&P for the status of the patient's chronic medical problems   Past Medical History:  Diagnosis Date  . Asthma   . DKA (diabetic ketoacidoses) (HCC) 06/2017  . Obesity     Review of Systems: Constitutional: Negative for fever, chills, weight loss and malaise/fatigue.  HEENT: No headaches, vision problems, cough, hearing problems  Respiratory: Negative for cough, shortness of breath and wheezing.  Gastrointestinal: Negative for heartburn, nausea, vomiting, abdominal pain, diarrhea and constipation. No hematochezia, or melena No polyuria, polydipsia  Physical Exam: Vitals:   08/01/17 1117  BP: (!) 147/71  Pulse: (!) 106  Temp: 98.3 F (36.8 C)  TempSrc: Oral  SpO2: 100%  Weight: (!) 331 lb 14.4 oz (150.5 kg)  Height:  (1.676 m)    General: A&O, in NAD, morbidly obese HEENT: EOMI, sclera white, conjunctiva pink , MMM, no thrush noted CV: RRR, normal s1, s2, no m/r/g, Resp: equal and symmetric breath sounds, no wheezing or crackles  Abdomen: soft, nontender, nondistended, +BS   Assessment & Plan:   See encounters tab for problem based medical decision making. Patient discussed with Dr. Cleda Daub

## 2017-08-01 NOTE — Patient Instructions (Addendum)
Thank you for your visit today Please take 45 units of Lantus Please take metformin 500 mg twice a day Please take lisinopril 40 mg daily  Please follow up in 2 weeks with your meter- please always bring your meter with you at all appointments

## 2017-08-01 NOTE — Assessment & Plan Note (Signed)
Lab Results  Component Value Date   HGBA1C 10.5 (H) 07/25/2017    Patient is a newly diagnosed diabetic who presented as a DKA vs HHS. He was sent home on insulin and metformin. He says that he is compliant with lantus and novolog, and checks CBGs 3-4 times a day- did not bring meter. Lowest reading is 250-, highest 450 with average seems to be in 300s per patient. Denies sx of hypoglycemia. In [place], GAD antibodies negative.   Plan -advised pt to rbign meter at all appts -will conservatively increase lantus to 45 units daily, and continue novolog 10 units TID -increase metformin to 500 mg BID with eventual goal of 1000 mg BID.  -referred to diabetes education -obtain urine microalb -follow up in 2 weeks

## 2017-08-01 NOTE — Progress Notes (Signed)
Internal Medicine Clinic Attending  Case discussed with Dr. Saraiya at the time of the visit.  We reviewed the resident's history and exam and pertinent patient test results.  I agree with the assessment, diagnosis, and plan of care documented in the resident's note.  

## 2017-08-01 NOTE — Assessment & Plan Note (Signed)
Pt smokes 0.5 ppd for about 9 years. Is thinking about qutiting but does not desire any treatment at this time  Plan -CTM

## 2017-08-02 NOTE — Addendum Note (Signed)
Addended by: Bufford Spikes on: 08/02/2017 08:55 AM   Modules accepted: Orders

## 2017-08-15 ENCOUNTER — Encounter: Payer: Self-pay | Admitting: Dietician

## 2017-08-15 ENCOUNTER — Ambulatory Visit: Payer: Self-pay

## 2017-08-22 NOTE — Addendum Note (Signed)
Addended by: Neomia DearPOWERS, Demoni Gergen E on: 08/22/2017 02:36 PM   Modules accepted: Orders

## 2017-08-23 ENCOUNTER — Emergency Department (HOSPITAL_COMMUNITY)
Admission: EM | Admit: 2017-08-23 | Discharge: 2017-08-23 | Disposition: A | Discharge status: Home / Self Care | Payer: Self-pay | Attending: Emergency Medicine | Admitting: Emergency Medicine

## 2017-08-23 ENCOUNTER — Inpatient Hospital Stay (HOSPITAL_COMMUNITY)
Admission: AD | Admit: 2017-08-23 | Discharge: 2017-08-24 | DRG: 638 | Disposition: A | Discharge status: Home / Self Care | Payer: Self-pay | Source: Ambulatory Visit | Attending: Internal Medicine | Admitting: Internal Medicine

## 2017-08-23 ENCOUNTER — Ambulatory Visit (INDEPENDENT_AMBULATORY_CARE_PROVIDER_SITE_OTHER): Payer: Self-pay | Admitting: Internal Medicine

## 2017-08-23 ENCOUNTER — Other Ambulatory Visit: Payer: Self-pay

## 2017-08-23 ENCOUNTER — Encounter (HOSPITAL_COMMUNITY): Payer: Self-pay

## 2017-08-23 ENCOUNTER — Encounter: Payer: Self-pay | Admitting: Internal Medicine

## 2017-08-23 VITALS — BP 139/82 | HR 102 | Temp 98.4°F | Ht 66.0 in | Wt 316.5 lb

## 2017-08-23 DIAGNOSIS — Z91012 Allergy to eggs: Secondary | ICD-10-CM

## 2017-08-23 DIAGNOSIS — F17200 Nicotine dependence, unspecified, uncomplicated: Secondary | ICD-10-CM | POA: Diagnosis present

## 2017-08-23 DIAGNOSIS — E1310 Other specified diabetes mellitus with ketoacidosis without coma: Secondary | ICD-10-CM

## 2017-08-23 DIAGNOSIS — E1110 Type 2 diabetes mellitus with ketoacidosis without coma: Secondary | ICD-10-CM

## 2017-08-23 DIAGNOSIS — Z794 Long term (current) use of insulin: Secondary | ICD-10-CM

## 2017-08-23 DIAGNOSIS — Z885 Allergy status to narcotic agent status: Secondary | ICD-10-CM

## 2017-08-23 DIAGNOSIS — Z5321 Procedure and treatment not carried out due to patient leaving prior to being seen by health care provider: Secondary | ICD-10-CM | POA: Insufficient documentation

## 2017-08-23 DIAGNOSIS — F17210 Nicotine dependence, cigarettes, uncomplicated: Secondary | ICD-10-CM | POA: Diagnosis present

## 2017-08-23 DIAGNOSIS — E870 Hyperosmolality and hypernatremia: Secondary | ICD-10-CM | POA: Diagnosis present

## 2017-08-23 DIAGNOSIS — I4510 Unspecified right bundle-branch block: Secondary | ICD-10-CM | POA: Diagnosis present

## 2017-08-23 DIAGNOSIS — I10 Essential (primary) hypertension: Secondary | ICD-10-CM | POA: Diagnosis present

## 2017-08-23 DIAGNOSIS — R42 Dizziness and giddiness: Secondary | ICD-10-CM | POA: Diagnosis present

## 2017-08-23 DIAGNOSIS — R000 Tachycardia, unspecified: Secondary | ICD-10-CM

## 2017-08-23 DIAGNOSIS — H538 Other visual disturbances: Secondary | ICD-10-CM | POA: Diagnosis present

## 2017-08-23 DIAGNOSIS — IMO0001 Reserved for inherently not codable concepts without codable children: Secondary | ICD-10-CM | POA: Diagnosis present

## 2017-08-23 DIAGNOSIS — Z833 Family history of diabetes mellitus: Secondary | ICD-10-CM

## 2017-08-23 DIAGNOSIS — J069 Acute upper respiratory infection, unspecified: Secondary | ICD-10-CM | POA: Diagnosis present

## 2017-08-23 DIAGNOSIS — Z6841 Body Mass Index (BMI) 40.0 and over, adult: Secondary | ICD-10-CM

## 2017-08-23 DIAGNOSIS — E1165 Type 2 diabetes mellitus with hyperglycemia: Secondary | ICD-10-CM

## 2017-08-23 DIAGNOSIS — E669 Obesity, unspecified: Secondary | ICD-10-CM | POA: Diagnosis present

## 2017-08-23 DIAGNOSIS — E739 Lactose intolerance, unspecified: Secondary | ICD-10-CM | POA: Diagnosis present

## 2017-08-23 DIAGNOSIS — Z91011 Allergy to milk products: Secondary | ICD-10-CM

## 2017-08-23 DIAGNOSIS — J45909 Unspecified asthma, uncomplicated: Secondary | ICD-10-CM | POA: Diagnosis present

## 2017-08-23 LAB — BASIC METABOLIC PANEL
ANION GAP: 15 (ref 5–15)
Anion gap: 12 (ref 5–15)
Anion gap: 10 (ref 5–15)
BUN: 12 mg/dL (ref 6–20)
BUN: 12 mg/dL (ref 6–20)
BUN: 8 mg/dL (ref 6–20)
CHLORIDE: 93 mmol/L — AB (ref 101–111)
CO2: 17 mmol/L — AB (ref 22–32)
CO2: 20 mmol/L — ABNORMAL LOW (ref 22–32)
CO2: 21 mmol/L — ABNORMAL LOW (ref 22–32)
CREATININE: 0.92 mg/dL (ref 0.61–1.24)
CREATININE: 0.73 mg/dL (ref 0.61–1.24)
Calcium: 9.7 mg/dL (ref 8.9–10.3)
Calcium: 9 mg/dL (ref 8.9–10.3)
Calcium: 8.7 mg/dL — ABNORMAL LOW (ref 8.9–10.3)
Chloride: 94 mmol/L — ABNORMAL LOW (ref 101–111)
Chloride: 101 mmol/L (ref 101–111)
Creatinine, Ser: 1 mg/dL (ref 0.61–1.24)
GFR calc Af Amer: >60 mL/min (ref >60)
GFR calc Af Amer: >60 mL/min (ref >60)
GFR calc non Af Amer: >60 mL/min (ref >60)
GFR calc non Af Amer: >60 mL/min (ref >60)
GLUCOSE: 491 mg/dL — AB (ref 65–99)
Glucose, Bld: 422 mg/dL — ABNORMAL HIGH (ref 65–99)
Glucose, Bld: 236 mg/dL — ABNORMAL HIGH (ref 65–99)
POTASSIUM: 4.6 mmol/L (ref 3.5–5.1)
POTASSIUM: 4.8 mmol/L (ref 3.5–5.1)
Potassium: 3.8 mmol/L (ref 3.5–5.1)
SODIUM: 132 mmol/L — AB (ref 135–145)
Sodium: 125 mmol/L — ABNORMAL LOW (ref 135–145)
Sodium: 126 mmol/L — ABNORMAL LOW (ref 135–145)

## 2017-08-23 LAB — URINALYSIS, ROUTINE W REFLEX MICROSCOPIC
BACTERIA UA: NONE SEEN
BILIRUBIN URINE: NEGATIVE
Glucose, UA: >=500 mg/dL — AB
Hgb urine dipstick: NEGATIVE
Ketones, ur: 20 mg/dL — AB
Leukocytes, UA: NEGATIVE
NITRITE: NEGATIVE
Protein, ur: NEGATIVE mg/dL
SPECIFIC GRAVITY, URINE: 1.022 (ref 1.005–1.030)
Squamous Epithelial / LPF: NONE SEEN
pH: 5 (ref 5.0–8.0)

## 2017-08-23 LAB — CBC
HEMATOCRIT: 46.3 % (ref 39.0–52.0)
HEMOGLOBIN: 16.8 g/dL (ref 13.0–17.0)
MCH: 29.5 pg (ref 26.0–34.0)
MCHC: 36.3 g/dL — AB (ref 30.0–36.0)
MCV: 81.2 fL (ref 78.0–100.0)
Platelets: 359 10*3/uL (ref 150–400)
RBC: 5.7 MIL/uL (ref 4.22–5.81)
RDW: 14 % (ref 11.5–15.5)
WBC: 9.1 10*3/uL (ref 4.0–10.5)

## 2017-08-23 LAB — CBG MONITORING, ED: GLUCOSE-CAPILLARY: 507 mg/dL — AB (ref 65–99)

## 2017-08-23 LAB — GLUCOSE, CAPILLARY
GLUCOSE-CAPILLARY: 381 mg/dL — AB (ref 65–99)
GLUCOSE-CAPILLARY: 263 mg/dL — AB (ref 65–99)
GLUCOSE-CAPILLARY: 225 mg/dL — AB (ref 65–99)
Glucose-Capillary: 166 mg/dL — ABNORMAL HIGH (ref 65–99)
Glucose-Capillary: 313 mg/dL — ABNORMAL HIGH (ref 65–99)

## 2017-08-23 LAB — MRSA PCR SCREENING: MRSA BY PCR: NEGATIVE

## 2017-08-23 MED ORDER — SODIUM CHLORIDE 0.9 % IV BOLUS (SEPSIS)
1000.0000 mL | Freq: Once | INTRAVENOUS | Status: AC
  Administered 2017-08-23: 1000 mL via INTRAVENOUS

## 2017-08-23 MED ORDER — SODIUM CHLORIDE 0.9 % IV SOLN
INTRAVENOUS | Status: DC
  Administered 2017-08-23: 3.2 [IU]/h via INTRAVENOUS
  Filled 2017-08-23 (×2): qty 1

## 2017-08-23 MED ORDER — IBUPROFEN 200 MG PO TABS
400.0000 mg | ORAL_TABLET | Freq: Four times a day (QID) | ORAL | Status: DC | PRN
  Administered 2017-08-23: 400 mg via ORAL
  Filled 2017-08-23: qty 2

## 2017-08-23 MED ORDER — SODIUM CHLORIDE 0.9 % IV SOLN
INTRAVENOUS | Status: AC
  Administered 2017-08-23: 19:00:00 via INTRAVENOUS

## 2017-08-23 MED ORDER — DEXTROSE-NACL 5-0.45 % IV SOLN
INTRAVENOUS | Status: DC
Start: 2017-08-23 — End: 2017-08-24
  Administered 2017-08-23: 22:00:00 via INTRAVENOUS

## 2017-08-23 MED ORDER — SODIUM CHLORIDE 0.9 % IV SOLN: INTRAVENOUS | Status: DC

## 2017-08-23 MED ORDER — SODIUM CHLORIDE 0.9 % IV SOLN
INTRAVENOUS | Status: DC
  Administered 2017-08-23: 20:00:00 via INTRAVENOUS

## 2017-08-23 MED ORDER — ENOXAPARIN SODIUM 40 MG/0.4ML ~~LOC~~ SOLN
40.0000 mg | SUBCUTANEOUS | Status: DC
  Administered 2017-08-23: 40 mg via SUBCUTANEOUS
  Filled 2017-08-23: qty 0.4

## 2017-08-23 MED ORDER — PNEUMOCOCCAL VAC POLYVALENT 25 MCG/0.5ML IJ INJ: 0.5000 mL | INJECTION | INTRAMUSCULAR | Status: DC

## 2017-08-23 MED ORDER — POTASSIUM CHLORIDE 10 MEQ/100ML IV SOLN
10.0000 meq | INTRAVENOUS | Status: AC
  Administered 2017-08-23 (×2): 10 meq via INTRAVENOUS
  Filled 2017-08-23 (×2): qty 100

## 2017-08-23 NOTE — ED Notes (Signed)
Checked patient cbg it was 62507 notified RN Asher MuirJamie of blood sugar

## 2017-08-23 NOTE — Progress Notes (Signed)
   CC: hyperglycemia   HPI:  Mr.Deakon Kage is a 26 y.o. male with past medical history outlined below here for hyperglycemia. For the details of today's visit, please refer to the assessment and plan.  Past Medical History:  Diagnosis Date  . Asthma   . DKA (diabetic ketoacidoses) (HCC) 06/2017  . Obesity     Review of Systems  Constitutional: Negative for chills and fever.  HENT: Positive for sore throat.   Cardiovascular: Positive for palpitations.  Gastrointestinal: Negative for nausea and vomiting.  Genitourinary: Positive for frequency.  Neurological: Positive for dizziness.    Physical Exam:  Vitals:   08/23/17 1435  BP: 139/82  Pulse: (!) 102  Temp: 98.4 F (36.9 C)  TempSrc: Oral  SpO2: 99%  Weight: (!) 316 lb 8 oz (143.6 kg)  Height: 5\' 6"  (1.676 m)    Constitutional: NAD, appears comfortable Cardiovascular: Tachycardic but regular, no murmurs, rubs, or gallops.  Pulmonary/Chest: CTAB, no wheezes, rales, or rhonchi.  Abdominal: Soft, non tender, non distended. +BS.  Extremities: Warm and well perfused. No edema.  Psychiatric: Normal mood and affect  Assessment & Plan:   See Encounters Tab for problem based charting.  Patient discussed with Dr. Heide SparkNarendra

## 2017-08-23 NOTE — ED Notes (Signed)
Pt called to be taken back to room, pt did not answer x3. 

## 2017-08-23 NOTE — Assessment & Plan Note (Signed)
Patient is presenting with hypoglycemia, general malaise, dizziness, increased thirst, and urinary frequency. He reports compliance with his insulin. He takes 10 units of NovoLog TID with meals and 45 units of Lantus at bedtime. He also reports compliance with his metformin 500 mg twice a day. He denies dietary indiscretion, but reports symptoms of sore throat and URI that began 3 days ago that likely precipitated is uncontrolled CBGs. He reports over the last 2 days, his blood sugars at home have been elevated and undetectable on his home meter. He initially presented to the ED today, but left after lab draws and [facility]. ED labs were significant for CBG of 507. BMP with sodium 125, potassium 4.6, bicarbonate 17, BUN 12, creatinine 1.0, and glucose 491 with a GAP of 15. UA revealed >500 glucose and 20 ketones. CBC was unremarkable, wbc of 9.1. I-STAT VBG was ordered but not performed. Will admit to IMTS for DKA vs. HHS management, discussed with on call resident.  -- Admit to stepdown unit -- DKA protocol

## 2017-08-23 NOTE — Progress Notes (Signed)
Pt arrived on unit @1730 . Spoke w/ IMTS to relay no orders in system and pts CBG is over 400.  Was told that MD is currently working on orders. Asked if MD wanted 1x dose of subq insulin while orders were being placed. Was told not at this time.

## 2017-08-23 NOTE — H&P (Signed)
Date: 08/23/2017               Patient Name:  [name] MRN: [id number]  DOB: 04/04/1991 Age / Sex: 26 y.o., male   PCP: Patient, No Pcp Per         Medical Service: Internal Medicine Teaching Service         Attending Physician: Dr. Gust Rung, DO    First Contact: Dr. Frances Furbish Pager: [phone]  Second Contact: Dr. Samuella Cota Pager: [phone]       After Hours (After 5p/  First Contact Pager: [phone]  weekends / holidays): Second Contact Pager: [phone]   Chief Complaint: dizziness, polyuria, polydipsia  History of Present Illness:   26 yo male with PMH of recently diagnosed T2DM  endorses 3 days of blurry vision, increased thirst, polydipsia, polyuria, dizziness. He endorses about 3 day history of cold-like symptoms including rhinorrhea, congestion, sore throat and right ear fullness. He endorses some palpitations and shortness of breath the last 3 days. He denies diarrhea, nausea, vomiting, fevers, chills.  Prior to this episode, patient's DM was still not well controlled, usually in the high 300's.  In fact he was admitted about a month ago for Hyperglycemia when his diagnosis of diabetes was made.  His blood sugars never went below 300 during his hospital stay and he was discharged on lantus 40 units and novolog 10 units and metformin 500mg  daily with the intent that he would follow up in the clinic.  He went to his first follow up appointment one week later and did not bring his meter by his account however his sugars were still very high none below 250.  Therefore he was increased to 45 units of lantus and continued with 10 units of novolog TID and metformin increased to 500 bid.  He had a follow up appointment scheduled with the acc clinic and diabetes educator for two weeks later and he no showed.   Since then his blood sugar has remained high  but for the last couple of days it's been undetectably high. He states compliance with Novolog 10 units TID, Lantus 45 units qhs  and metformin 500mg  BID. He states that he rotates his injection sites to both sides of abdomen, both thighs and right upper arm.     Meds:  No outpatient prescriptions have been marked as taking for the 08/23/17 encounter [facility] Encounter).   Allergies: Allergies as of 08/23/2017 - Review Complete 08/23/2017  Allergen Reaction Noted  . Vicodin [hydrocodone-acetaminophen] Anaphylaxis 01/22/2014  . Eggs or egg-derived products Nausea And Vomiting 07/25/2017  . Lactose intolerance (gi) Rash 01/22/2014   Past Medical History:  Diagnosis Date  . Asthma   . DKA (diabetic ketoacidoses) (HCC) 06/2017  . Obesity     Family History:  Mother diabetes    Social History:  Social History   Social History  . Marital status: Single    Spouse name: N/A  . Number of children: N/A  . Years of education: N/A   Occupational History  . Not on file.   Social History Main Topics  . Smoking status: Current Every Day Smoker    Packs/day: 0.10    Years: 5.00    Types: Cigarettes  . Smokeless tobacco: Never Used     Comment: cutting back - now 3-4 cigarettes per day  . Alcohol use No  . Drug use: Yes    Types: Marijuana     Comment: ~3 times per week  .  Sexual activity: Not on file   Other Topics Concern  . Not on file   Social History Narrative  . No narrative on file    Review of Systems: A complete ROS was negative except as per HPI.   Physical Exam: Blood pressure (!) 153/94, pulse 86, temperature 98.3 F (36.8 C), temperature source Oral, resp. rate 17, SpO2 (!) 0 %. Physical Exam  Constitutional: He is oriented to person, place, and time. He appears well-developed and well-nourished.  HENT:  Head: Normocephalic and atraumatic.  Eyes: Right eye exhibits no discharge. Left eye exhibits no discharge. No scleral icterus.  Cardiovascular: Normal rate, regular rhythm, normal heart sounds and intact distal pulses.  Exam reveals no gallop and no friction rub.   No murmur  heard. Pulmonary/Chest: Effort normal and breath sounds normal. No respiratory distress. He has no wheezes. He has no rales.  Abdominal: Soft. Bowel sounds are normal. He exhibits no distension and no mass. There is no tenderness. There is no guarding.  Musculoskeletal: He exhibits no edema or deformity.  Neurological: He is alert and oriented to person, place, and time.  Skin: Skin is warm and dry.    EKG: personally reviewed my interpretation is sinus rhythm with Bifasicular block  CXR: personally reviewed my interpretation is none available  Assessment & Plan by Problem: Active Problems:   DKA, type 2 (HCC)  26 yo male with PMH of recently diagnosed T2DM  endorses 3 days of blurry vision, increased thirst, polydipsia, polyuria, dizziness found to be in DKA.      DKA: patient here with continued elevated blood sugars even higher recently with new viral URI.  Comes in with metabolic acidosis, anion gap of 15  feeling dizzy, with blurry vision.    -IV insulin drip -IV fluids w/potassium supplementation -will transition to subq insulin once stabilized    Dispo: Admit patient to Inpatient with expected length of stay greater than 2 midnights.  Signed: Angelita InglesWinfrey, Lynnell Fiumara B, MD 08/23/2017, 8:12 PM  Thornell MuleBrandon Roopa Graver MD PGY-1 Internal Medicine Pager # [phone]

## 2017-08-23 NOTE — ED Notes (Signed)
Pt left ED, went to outpatient clinic.  They are direct admitting him.

## 2017-08-23 NOTE — ED Notes (Signed)
No answer x2 for room 

## 2017-08-23 NOTE — ED Triage Notes (Signed)
Patient complains of general fatigue and weakness x 1 day. States that his blood sugar read high this am. Took insulin and po meds this am. Alert and oriented. Skin warm and dry

## 2017-08-23 NOTE — Progress Notes (Signed)
Report called to [place] on [address].  Patient transported via wheelchair to [address] Bed 3.  Ambulated to bathroom. Alert oriented .  Accompanied by mother.  Angelina OkGladys Saanvika Vazques, RN 08/23/2017 5:15 PM.

## 2017-08-24 ENCOUNTER — Observation Stay (HOSPITAL_BASED_OUTPATIENT_CLINIC_OR_DEPARTMENT_OTHER): Payer: Self-pay

## 2017-08-24 DIAGNOSIS — R9431 Abnormal electrocardiogram [ECG] [EKG]: Secondary | ICD-10-CM

## 2017-08-24 DIAGNOSIS — J069 Acute upper respiratory infection, unspecified: Secondary | ICD-10-CM

## 2017-08-24 DIAGNOSIS — I4510 Unspecified right bundle-branch block: Secondary | ICD-10-CM | POA: Diagnosis present

## 2017-08-24 LAB — BASIC METABOLIC PANEL
ANION GAP: 9 (ref 5–15)
Anion gap: 10 (ref 5–15)
Anion gap: 10 (ref 5–15)
BUN: 8 mg/dL (ref 6–20)
BUN: 6 mg/dL (ref 6–20)
BUN: 7 mg/dL (ref 6–20)
CALCIUM: 8.3 mg/dL — AB (ref 8.9–10.3)
CHLORIDE: 102 mmol/L (ref 101–111)
CO2: 20 mmol/L — AB (ref 22–32)
CO2: 23 mmol/L (ref 22–32)
CO2: 19 mmol/L — AB (ref 22–32)
CREATININE: 0.74 mg/dL (ref 0.61–1.24)
Calcium: 8.4 mg/dL — ABNORMAL LOW (ref 8.9–10.3)
Calcium: 8.5 mg/dL — ABNORMAL LOW (ref 8.9–10.3)
Chloride: 98 mmol/L — ABNORMAL LOW (ref 101–111)
Chloride: 100 mmol/L — ABNORMAL LOW (ref 101–111)
Creatinine, Ser: 0.71 mg/dL (ref 0.61–1.24)
Creatinine, Ser: 0.7 mg/dL (ref 0.61–1.24)
GFR calc Af Amer: >60 mL/min (ref >60)
GFR calc Af Amer: >60 mL/min (ref >60)
GFR calc Af Amer: >60 mL/min (ref >60)
GFR calc non Af Amer: >60 mL/min (ref >60)
GFR calc non Af Amer: >60 mL/min (ref >60)
GLUCOSE: 167 mg/dL — AB (ref 65–99)
GLUCOSE: 375 mg/dL — AB (ref 65–99)
Glucose, Bld: 159 mg/dL — ABNORMAL HIGH (ref 65–99)
Potassium: 3.2 mmol/L — ABNORMAL LOW (ref 3.5–5.1)
Potassium: 3.8 mmol/L (ref 3.5–5.1)
Potassium: 4.2 mmol/L (ref 3.5–5.1)
Sodium: 131 mmol/L — ABNORMAL LOW (ref 135–145)
Sodium: 131 mmol/L — ABNORMAL LOW (ref 135–145)
Sodium: 129 mmol/L — ABNORMAL LOW (ref 135–145)

## 2017-08-24 LAB — CBC
HEMATOCRIT: 41.1 % (ref 39.0–52.0)
Hemoglobin: 14.9 g/dL (ref 13.0–17.0)
MCH: 29.6 pg (ref 26.0–34.0)
MCHC: 36.3 g/dL — ABNORMAL HIGH (ref 30.0–36.0)
MCV: 81.5 fL (ref 78.0–100.0)
Platelets: 250 10*3/uL (ref 150–400)
RBC: 5.04 MIL/uL (ref 4.22–5.81)
RDW: 13.6 % (ref 11.5–15.5)
WBC: 9.7 10*3/uL (ref 4.0–10.5)

## 2017-08-24 LAB — GLUCOSE, CAPILLARY
GLUCOSE-CAPILLARY: 152 mg/dL — AB (ref 65–99)
GLUCOSE-CAPILLARY: 209 mg/dL — AB (ref 65–99)
GLUCOSE-CAPILLARY: 309 mg/dL — AB (ref 65–99)
GLUCOSE-CAPILLARY: 357 mg/dL — AB (ref 65–99)
GLUCOSE-CAPILLARY: 204 mg/dL — AB (ref 65–99)
Glucose-Capillary: 428 mg/dL — ABNORMAL HIGH (ref 65–99)
Glucose-Capillary: 167 mg/dL — ABNORMAL HIGH (ref 65–99)
Glucose-Capillary: 164 mg/dL — ABNORMAL HIGH (ref 65–99)
Glucose-Capillary: 445 mg/dL — ABNORMAL HIGH (ref 65–99)
Glucose-Capillary: 388 mg/dL — ABNORMAL HIGH (ref 65–99)

## 2017-08-24 LAB — GLUCOSE, RANDOM: Glucose, Bld: 353 mg/dL — ABNORMAL HIGH (ref 65–99)

## 2017-08-24 LAB — ECHOCARDIOGRAM COMPLETE
HEIGHTINCHES: 66 in
Weight: 4955.94 oz

## 2017-08-24 MED ORDER — INSULIN DETEMIR 100 UNIT/ML ~~LOC~~ SOLN
25.0000 [IU] | Freq: Every day | SUBCUTANEOUS | Status: DC
  Administered 2017-08-24: 25 [IU] via SUBCUTANEOUS
  Filled 2017-08-24: qty 0.25

## 2017-08-24 MED ORDER — INSULIN ASPART 100 UNIT/ML ~~LOC~~ SOLN: 0.0000 [IU] | Freq: Every day | SUBCUTANEOUS | Status: DC

## 2017-08-24 MED ORDER — SODIUM CHLORIDE 0.9 % IV SOLN
INTRAVENOUS | Status: DC
  Administered 2017-08-24: 05:00:00 via INTRAVENOUS

## 2017-08-24 MED ORDER — INSULIN GLARGINE 100 UNIT/ML ~~LOC~~ SOLN: 45.0000 [IU] | Freq: Every day | SUBCUTANEOUS | Status: DC

## 2017-08-24 MED ORDER — INSULIN ASPART 100 UNIT/ML ~~LOC~~ SOLN: 0.0000 [IU] | Freq: Three times a day (TID) | SUBCUTANEOUS | Status: DC

## 2017-08-24 MED ORDER — INSULIN ASPART 100 UNIT/ML ~~LOC~~ SOLN
0.0000 [IU] | Freq: Three times a day (TID) | SUBCUTANEOUS | Status: DC
  Administered 2017-08-24: 20 [IU] via SUBCUTANEOUS

## 2017-08-24 MED ORDER — INSULIN ASPART 100 UNIT/ML ~~LOC~~ SOLN
15.0000 [IU] | Freq: Three times a day (TID) | SUBCUTANEOUS | Status: DC
  Administered 2017-08-24 (×2): 15 [IU] via SUBCUTANEOUS

## 2017-08-24 MED ORDER — INSULIN GLARGINE 100 UNIT/ML ~~LOC~~ SOLN: 50.0000 [IU] | Freq: Every day | SUBCUTANEOUS | 11 refills | Status: DC

## 2017-08-24 MED ORDER — GLUCOSE BLOOD VI STRP: ORAL_STRIP | 12 refills | Status: DC

## 2017-08-24 MED ORDER — INSULIN ASPART 100 UNIT/ML ~~LOC~~ SOLN
20.0000 [IU] | Freq: Once | SUBCUTANEOUS | Status: AC
  Administered 2017-08-24: 20 [IU] via SUBCUTANEOUS

## 2017-08-24 MED ORDER — INSULIN ASPART 100 UNIT/ML ~~LOC~~ SOLN: 15.0000 [IU] | Freq: Three times a day (TID) | SUBCUTANEOUS | 11 refills | Status: DC

## 2017-08-24 MED ORDER — LISINOPRIL 40 MG PO TABS: 40.0000 mg | ORAL_TABLET | Freq: Every day | ORAL | 0 refills | Status: DC

## 2017-08-24 MED ORDER — INSULIN GLARGINE 100 UNIT/ML ~~LOC~~ SOLN
50.0000 [IU] | Freq: Every day | SUBCUTANEOUS | Status: DC
  Filled 2017-08-24: qty 0.5

## 2017-08-24 NOTE — Care Management Note (Signed)
Case Management Note  Patient Details  Name: Roger HartshornGregory Farley MRN: [id number] Date of Birth: November 07, 1990  Subjective/Objective:     From home with girlfriend, pta indep, he states he is followed by the internal medicine clinic, he states he has insulin at home and he takes metformin and linsinopril, per diabetic coordinator note he will need glucose strips and linsinopril at dc.  [facility] and NCM will give him the reli[facility]on information [facility].  He is for possible dc today.               Action/Plan: NCM will follow for dc needs.   Expected Discharge Date:                  Expected Discharge Plan:  Home/Self Care  In-House Referral:     Discharge planning Services  CM Consult, Other - See comment  Post Acute Care Choice:    Choice offered to:     DME Arranged:    DME Agency:     HH Arranged:    HH Agency:     Status of Service:  Completed, signed off  If discussed at Long Length of Stay Meetings, dates discussed:    Additional Comments:  Leone Havenaylor, Arianna Delsanto Clinton, RN 08/24/2017, 12:42 PM

## 2017-08-24 NOTE — Progress Notes (Addendum)
Inpatient Diabetes Program Recommendations  AACE/ADA: New Consensus Statement on Inpatient Glycemic Control (2015)  Target Ranges:  Prepandial:   less than 140 mg/dL      Peak postprandial:   less than 180 mg/dL (1-2 hours)      Critically ill patients:  140 - 180 mg/dL   Lab Results  Component Value Date   GLUCAP 209 (H) 08/24/2017   HGBA1C 10.5 (H) 07/25/2017   Review of Glycemic Control  Diabetes history: DM 2 New diagnosis 1 month ago Outpatient Diabetes medications: Lantus 45 units, Novolog 10 units tid with meals, Metformin 1000 mg BID Current orders for Inpatient glycemic control:   Inpatient Diabetes Program Recommendations:    Consider increasing Lantus dose from 0.3 units/kg to 0.4 units/kg = Lantus 56 units (due to amount may need BID dosing).  Patient reports needing Lisinopril and glucose test strips at time of d/c.  Patient reports having court date for the reason of missing DM education at the clinic.  Thanks,  Roger DeemShannon Drayce Tawil RN, MSN, [facility] Inpatient Diabetes Coordinator Team Pager [phone] (8a-5p)

## 2017-08-24 NOTE — Progress Notes (Signed)
Internal Medicine Clinic Attending  Case discussed with Dr. Guilloud at the time of the visit.  We reviewed the resident's history and exam and pertinent patient test results.  I agree with the assessment, diagnosis, and plan of care documented in the resident's note.  

## 2017-08-24 NOTE — Progress Notes (Signed)
  Echocardiogram 2D Echocardiogram has been performed.  Celene SkeenVijay  Jenea Dake 08/24/2017, 4:21 PM

## 2017-08-24 NOTE — Progress Notes (Signed)
Pts 1630 CBG was 445.  Ordered stat serum glucose and paged attending MD/IMTS.  Was instructed to give 20 units novolog 1x dose now and delay dinner/dinner coverage until 1830/1900.

## 2017-08-24 NOTE — Discharge Instructions (Signed)
We have changed how you take your insulin.  Please take 50 units of LANTUS every night before bed.  Please take 15 units of NOVOLOG with meals.  The internal medicine clinic will call you with a follow-up appointment.  Please return to the ED if your symptoms should return.

## 2017-08-24 NOTE — Progress Notes (Signed)
   Subjective:  Patient feeling much better today, not complaining of blurry vision or dizziness.  States that he is urinating much less frequently.  Denies any chest pain or shortness of breath.    Objective:  Vital signs in last 24 hours: Vitals:   08/24/17 0400 08/24/17 0754 08/24/17 0800 08/24/17 0900  BP: 116/69 (!) 147/101 (!) 153/135 (!) 147/97  Pulse: 71 73 78 79  Resp: 20 15 (!) 29 12  Temp:  97.8 F (36.6 C)    TempSrc:  Oral    SpO2: 96% 98% 100% 96%  Weight:      Height:       Physical Exam  Constitutional: He is oriented to person, place, and time. He appears well-developed and well-nourished.  Eyes: Right eye exhibits no discharge. Left eye exhibits no discharge. No scleral icterus.  Cardiovascular: Normal rate, regular rhythm, normal heart sounds and intact distal pulses.  Exam reveals no gallop and no friction rub.   No murmur heard. Pulmonary/Chest: Effort normal and breath sounds normal. No respiratory distress. He has no wheezes. He has no rales.  Abdominal: Soft. Bowel sounds are normal. He exhibits no distension and no mass. There is no tenderness. There is no guarding.  Neurological: He is alert and oriented to person, place, and time.    Assessment/Plan:  Active Problems:   DKA, type 2 (HCC)  DKA: patient here with continued elevated blood sugars at home for the past few weeks, even higher recently with new viral URI.  Comes in with metabolic acidosis, anion gap of 15 feeling dizzy, with blurry vision.    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  He denies current chest pain but on chart review it appears he was having chest pain during the last admission  -Will order ECHO to further evaluate cardiac function.       Dispo: Anticipated discharge in approximately 2 day(s).   Angelita InglesWinfrey, Ilo Beamon B, MD 08/24/2017, 11:37 AM Thornell MuleBrandon English Craighead MD PGY-1 Internal Medicine Pager # [phone]

## 2017-08-24 NOTE — Progress Notes (Signed)
Patient requesting to be discharged due to family emergency , doctor at bedside, orders placed for discharge. Patient ambulated out of the unit with brother. Patient discharge packet was given.

## 2017-08-24 NOTE — Plan of Care (Signed)
Problem: Education: Goal: Knowledge of [facility] Education information/materials will improve Outcome: Progressing Patient fully oriented to unit and hospital. Discussed plan of care regarding insulin gtt and how we transition him off of it. Patient agreeable to plan. Questions answered. Patient complains of pain in his back x1 on shift, resolved with ibuprofen. Patient able to be transitioned off of insulin gtt at 4 am.   Problem: Safety: Goal: Ability to remain free from injury will improve Outcome: Completed/Met Date Met: 08/24/17 Patient a low fall risk, very stable on his feet, and calls for assistance oob.   Problem: Skin Integrity: Goal: Risk for impaired skin integrity will decrease Outcome: Progressing Patient able to turn self and bed changed to Progress[facility] ICU bed. Patient more comfortable.   Problem: Tissue Perfusion: Goal: Risk factors for ineffective tissue perfusion will decrease Outcome: Completed/Met Date Met: 08/24/17 Patient receiving Lovenox for VTE prophylaxis.   Problem: Activity: Goal: Risk for activity intolerance will decrease Outcome: Progressing Patient steady on feet, able to ambulate to bathroom and stand at bedside for urinal use. Turns self in bed regularly.

## 2017-08-27 NOTE — Discharge Summary (Signed)
Name: Roger HartshornGregory Fermin MRN: [id number] DOB: November 01, 1990 26 y.o. PCP: [name], [name], MD  Date of Admission: 08/23/2017  5:39 PM Date of Discharge: 08/24/2017 Attending Physician: Dr. Carlynn PurlErik Hoffman  Discharge Diagnosis: 1. DKA Active Problems:   HTN (hypertension)   Smoking   DKA, type 2 (HCC)   Right bundle branch block (RBBB) determined by electrocardiography  Discharge Medications: Allergies as of 08/24/2017      Reactions   Vicodin [hydrocodone-acetaminophen] Anaphylaxis   Eggs Or Egg-derived Products Nausea And Vomiting   Lactose Intolerance (gi) Rash      Medication List    TAKE these medications   glucose blood test strip Use as instructed   ibuprofen 600 MG tablet Commonly known as:  ADVIL,MOTRIN Take 1 tablet (600 mg total) by mouth every 6 (six) hours as needed.   insulin aspart 100 UNIT/ML injection Commonly known as:  novoLOG Inject 10 Units into the skin 3 (three) times daily with meals. [facility] What changed:  Another medication with the same name was added. Make sure you understand how and when to take each.   insulin aspart 100 UNIT/ML injection Commonly known as:  novoLOG Inject 15 Units into the skin 3 (three) times daily with meals. What changed:  You were already taking a medication with the same name, and this prescription was added. Make sure you understand how and when to take each.   insulin glargine 100 UNIT/ML injection Commonly known as:  LANTUS Inject 0.5 mLs (50 Units total) into the skin at bedtime. What changed:  how much to take  additional instructions   lisinopril 40 MG tablet Commonly known as:  PRINIVIL,ZESTRIL Take 1 tablet (40 mg total) by mouth daily.   metFORMIN 500 MG tablet Commonly known as:  GLUCOPHAGE Take 1 tablet (500 mg total) by mouth 2 (two) times daily with a meal.   nystatin 100000 UNIT/ML suspension Commonly known as:  MYCOSTATIN Take 5 mLs (500,000 Units total) by mouth 4 (four) times  daily. What changed:  when to take this  reasons to take this       Disposition and follow-up:   [name] [place].  At the hospital follow up visit please address:  1.   T2DM: Patient had to leave hospital prior to optimal titration of insulin. --discharged on Lantus 50 units qhs and Novolog 15 units TID wc; metformin 500mg  BID --please work with patient to titrate up to adequate insulin dosing as he is not optimally controlled at this time.  RBBB: Patient with RBBB on EKG this admission which is stable from prior admission EKG as well. Echo was obtained which revealed EF 55-60%, mild LVH, and no regional wall motion abnormalities. The right ventricle cavity size was mildly elevated. --work with patient to obtain optimal BP control  2.  Labs / imaging needed at time of follow-up: n/a  3.  Pending labs/ test needing follow-up: n/a  Follow-up Appointments: Follow-up Information    [name], [name], MD. Schedule an appointment as soon as possible for a visit in 1 week(s).   Specialty:  Internal Medicine Contact information: [address]  [place] [id number] [phone]           Hospital Course by problem list: Active Problems:   HTN (hypertension)   Smoking   DKA, type 2 (HCC)   Right bundle branch block (RBBB) determined by electrocardiography   DKA T2DM: Patient was admitted from the clinic when found  to be in mild DKA - mild anion gap metabolic acidosis with mild ketonuria. Patient stated compliance with insulin regimen and noted a URI for 3 days prior to admission. Notably, even prior to this presentation, his CBGs had not been optimally controlled, unfortunately. On admission, he had an abnormal EKG which showed RBBB which was consistent with prior EKG; he had no leukocytosis, fever or other signs/symptoms of infection that would have triggered this episode of DKA. Patient was treated with  IVF boluses, insulin drip and was transitioned to subq insulin successfully. The goal was to work with patient to optimally titrate his insulin at least in the hospital setting, however patient elected to leave due to a family emergency overnight. The night team had discharged him on his home regimen of Lantus 50 units qhs and Novolog 15 units TID wc, and metformin 500mg  BID. Patient was informed to call the [facility] to schedule close f/u appointment; a message was sent to the clinic front desk as well.   RBBB: On admission, patient's EKG was notable for RBBB, which was consistent with EKG from one month prior. Patient had no complaints of chest pain, shortness of breath, palpitations. Due to young age and concerning EKG, Echo was obtained which showed mild LVH, mildly dilated right ventricular cavity size, normal EF of 55-60%, normal LVD function, and no regional wall motion abnormalities.   Discharge Vitals:   BP (!) 169/93 (BP Location: Right Wrist)   Pulse (!) 105   Temp 98.7 F (37.1 C) (Oral)   Resp (!) 22   Ht 5\' 6"  (1.676 m)   Wt (!) 309 lb 11.9 oz (140.5 kg)   SpO2 99%   BMI 49.99 kg/m   Pertinent Labs, Studies, and Procedures:  BMP Latest Ref Rng & Units 08/24/2017 08/24/2017 08/24/2017  Glucose 65 - 99 mg/dL 161(W) 960(A) 540(J)  BUN 6 - 20 mg/dL - 7 6  Creatinine 8.11 - 1.24 mg/dL - 9.14 7.82  Sodium 956 - 145 mmol/L - 129(L) 131(L)  Potassium 3.5 - 5.1 mmol/L - 4.2 3.8  Chloride 101 - 111 mmol/L - 100(L) 98(L)  CO2 22 - 32 mmol/L - 19(L) 23  Calcium 8.9 - 10.3 mg/dL - 8.3(L) 8.5(L)   CBC Latest Ref Rng & Units 08/24/2017 08/23/2017 07/25/2017  WBC 4.0 - 10.5 K/uL 9.7 9.1 11.5(H)  Hemoglobin 13.0 - 17.0 g/dL 21.3 08.6 57.8  Hematocrit 39.0 - 52.0 % 41.1 46.3 41.6  Platelets 150 - 400 K/uL 250 359 258   Echo 08/24/17: - Left ventricle: The cavity size was normal. Wall thickness was   increased in a pattern of mild LVH. Systolic function was normal.   The estimated ejection  fraction was in the range of 55% to 60%.   Wall motion was normal; there were no regional wall motion   abnormalities. Left ventricular diastolic function parameters   were normal. - Right ventricle: The cavity size was mildly dilated. Wall   thickness was normal.  Signed: Nyra Market, MD 08/27/2017, 7:50 AM

## 2017-09-10 ENCOUNTER — Ambulatory Visit: Payer: Self-pay

## 2017-09-13 ENCOUNTER — Ambulatory Visit: Payer: Self-pay

## 2017-09-23 ENCOUNTER — Other Ambulatory Visit: Payer: Self-pay

## 2017-09-23 ENCOUNTER — Emergency Department (HOSPITAL_COMMUNITY): Payer: Self-pay

## 2017-09-23 ENCOUNTER — Inpatient Hospital Stay (HOSPITAL_COMMUNITY)
Admission: EM | Admit: 2017-09-23 | Discharge: 2017-09-25 | DRG: 638 | Discharge status: Against Medical Advice | Payer: Self-pay | Attending: Student in an Organized Health Care Education/Training Program | Admitting: Student in an Organized Health Care Education/Training Program

## 2017-09-23 DIAGNOSIS — Z6831 Body mass index (BMI) 31.0-31.9, adult: Secondary | ICD-10-CM

## 2017-09-23 DIAGNOSIS — E1100 Type 2 diabetes mellitus with hyperosmolarity without nonketotic hyperglycemic-hyperosmolar coma (NKHHC): Secondary | ICD-10-CM

## 2017-09-23 DIAGNOSIS — Z91012 Allergy to eggs: Secondary | ICD-10-CM

## 2017-09-23 DIAGNOSIS — E669 Obesity, unspecified: Secondary | ICD-10-CM | POA: Diagnosis present

## 2017-09-23 DIAGNOSIS — J029 Acute pharyngitis, unspecified: Secondary | ICD-10-CM

## 2017-09-23 DIAGNOSIS — J45909 Unspecified asthma, uncomplicated: Secondary | ICD-10-CM | POA: Diagnosis present

## 2017-09-23 DIAGNOSIS — F17210 Nicotine dependence, cigarettes, uncomplicated: Secondary | ICD-10-CM | POA: Diagnosis present

## 2017-09-23 DIAGNOSIS — E1165 Type 2 diabetes mellitus with hyperglycemia: Principal | ICD-10-CM | POA: Diagnosis present

## 2017-09-23 DIAGNOSIS — E739 Lactose intolerance, unspecified: Secondary | ICD-10-CM | POA: Diagnosis present

## 2017-09-23 DIAGNOSIS — Z91011 Allergy to milk products: Secondary | ICD-10-CM

## 2017-09-23 DIAGNOSIS — Z794 Long term (current) use of insulin: Secondary | ICD-10-CM

## 2017-09-23 DIAGNOSIS — Z79899 Other long term (current) drug therapy: Secondary | ICD-10-CM

## 2017-09-23 DIAGNOSIS — Z885 Allergy status to narcotic agent status: Secondary | ICD-10-CM

## 2017-09-23 DIAGNOSIS — E870 Hyperosmolality and hypernatremia: Secondary | ICD-10-CM | POA: Diagnosis present

## 2017-09-23 DIAGNOSIS — I10 Essential (primary) hypertension: Secondary | ICD-10-CM | POA: Diagnosis present

## 2017-09-23 DIAGNOSIS — E876 Hypokalemia: Secondary | ICD-10-CM | POA: Diagnosis present

## 2017-09-23 DIAGNOSIS — R000 Tachycardia, unspecified: Secondary | ICD-10-CM

## 2017-09-23 HISTORY — DX: Hyperglycemia, unspecified: R73.9

## 2017-09-23 LAB — BASIC METABOLIC PANEL
ANION GAP: 12 (ref 5–15)
Anion gap: 13 (ref 5–15)
BUN: 5 mg/dL — AB (ref 6–20)
BUN: 5 mg/dL — ABNORMAL LOW (ref 6–20)
CALCIUM: 9.3 mg/dL (ref 8.9–10.3)
CHLORIDE: 88 mmol/L — AB (ref 101–111)
CO2: 23 mmol/L (ref 22–32)
CO2: 22 mmol/L (ref 22–32)
CREATININE: 0.95 mg/dL (ref 0.61–1.24)
Calcium: 10.1 mg/dL (ref 8.9–10.3)
Chloride: 94 mmol/L — ABNORMAL LOW (ref 101–111)
Creatinine, Ser: 0.82 mg/dL (ref 0.61–1.24)
GFR calc Af Amer: >60 mL/min (ref >60)
GFR calc non Af Amer: >60 mL/min (ref >60)
Glucose, Bld: 937 mg/dL (ref 65–99)
Glucose, Bld: 470 mg/dL — ABNORMAL HIGH (ref 65–99)
Potassium: 3.9 mmol/L (ref 3.5–5.1)
Potassium: 3.3 mmol/L — ABNORMAL LOW (ref 3.5–5.1)
SODIUM: 128 mmol/L — AB (ref 135–145)
Sodium: 124 mmol/L — ABNORMAL LOW (ref 135–145)

## 2017-09-23 LAB — URINALYSIS, ROUTINE W REFLEX MICROSCOPIC
Bacteria, UA: NONE SEEN
Bilirubin Urine: NEGATIVE
Hgb urine dipstick: NEGATIVE
Ketones, ur: NEGATIVE mg/dL
Leukocytes, UA: NEGATIVE
Nitrite: NEGATIVE
PROTEIN: NEGATIVE mg/dL
SQUAMOUS EPITHELIAL / LPF: NONE SEEN
Specific Gravity, Urine: 1.026 (ref 1.005–1.030)
pH: 6 (ref 5.0–8.0)

## 2017-09-23 LAB — I-STAT VENOUS BLOOD GAS, ED
Acid-Base Excess: 4 mmol/L — ABNORMAL HIGH (ref 0.0–2.0)
Bicarbonate: 28.6 mmol/L — ABNORMAL HIGH (ref 20.0–28.0)
O2 SAT: 86 %
PCO2 VEN: 41.7 mmHg — AB (ref 44.0–60.0)
PH VEN: 7.444 — AB (ref 7.250–7.430)
PO2 VEN: 50 mmHg — AB (ref 32.0–45.0)
TCO2: 30 mmol/L (ref 22–32)

## 2017-09-23 LAB — RAPID STREP SCREEN (MED CTR MEBANE ONLY): STREPTOCOCCUS, GROUP A SCREEN (DIRECT): NEGATIVE

## 2017-09-23 LAB — CBC
HCT: 46.1 % (ref 39.0–52.0)
Hemoglobin: 16.5 g/dL (ref 13.0–17.0)
MCH: 29.3 pg (ref 26.0–34.0)
MCHC: 35.8 g/dL (ref 30.0–36.0)
MCV: 81.7 fL (ref 78.0–100.0)
PLATELETS: 338 10*3/uL (ref 150–400)
RBC: 5.64 MIL/uL (ref 4.22–5.81)
RDW: 13.6 % (ref 11.5–15.5)
WBC: 8.6 10*3/uL (ref 4.0–10.5)

## 2017-09-23 LAB — CBG MONITORING, ED: GLUCOSE-CAPILLARY: 466 mg/dL — AB (ref 65–99)

## 2017-09-23 MED ORDER — SODIUM CHLORIDE 0.9 % IV SOLN
INTRAVENOUS | Status: DC
  Administered 2017-09-23: 5.4 [IU]/h via INTRAVENOUS
  Filled 2017-09-23: qty 1

## 2017-09-23 MED ORDER — SODIUM CHLORIDE 0.9 % IV BOLUS (SEPSIS)
1000.0000 mL | Freq: Once | INTRAVENOUS | Status: AC
  Administered 2017-09-23: 1000 mL via INTRAVENOUS

## 2017-09-23 MED ORDER — POTASSIUM CHLORIDE IN NACL 20-0.9 MEQ/L-% IV SOLN
Freq: Once | INTRAVENOUS | Status: AC
  Administered 2017-09-23: 22:00:00 via INTRAVENOUS
  Filled 2017-09-23: qty 1000

## 2017-09-23 NOTE — ED Provider Notes (Signed)
[facility] Provider Note   CSN: [id number] Arrival date & time: 09/23/17  1826     History   Chief Complaint Chief Complaint  Patient presents with  . Hyperglycemia  . Sore Throat    HPI Roger HartshornGregory Farley is a 26 y.o. male.  Patient with recent diabetes diagnosis currently taking Lantus in the evening and NovoLog with meals. Patient has been out of his oral diabetic medications. Patient's felt unwell for couple days with sore throat mild cough and increased thirst. No fevers or chills. Patient cigarette smoker.      Past Medical History:  Diagnosis Date  . Asthma   . DKA (diabetic ketoacidoses) (HCC) 06/2017  . Obesity     Patient Active Problem List   Diagnosis Date Noted  . Right bundle branch block (RBBB) determined by electrocardiography 08/24/2017  . DKA, type 2 (HCC) 08/23/2017  . Type 2 diabetes mellitus (HCC) 08/01/2017  . Smoking 08/01/2017  . HTN (hypertension) 07/25/2017  . DKA (diabetic ketoacidoses) (HCC) 07/24/2017    Past Surgical History:  Procedure Laterality Date  . FINGER SURGERY    . TONSILLECTOMY         Home Medications    Prior to Admission medications   Medication Sig Start Date End Date Taking? Authorizing Provider  insulin aspart (NOVOLOG) 100 UNIT/ML injection Inject 15 Units into the skin 3 (three) times daily with meals. 08/25/17  Yes Molt, Bethany, DO  insulin glargine (LANTUS) 100 UNIT/ML injection Inject 0.5 mLs (50 Units total) into the skin at bedtime. 08/24/17  Yes Molt, Bethany, DO  lisinopril (PRINIVIL,ZESTRIL) 40 MG tablet Take 1 tablet (40 mg total) by mouth daily. 08/24/17  Yes Scherrie GerlachHuang, Jennifer, MD  metFORMIN (GLUCOPHAGE) 500 MG tablet Take 1 tablet (500 mg total) by mouth 2 (two) times daily with a meal. 08/01/17  Yes Deneise LeverSaraiya, Parth, MD  glucose blood test strip Use as instructed 08/24/17   Scherrie GerlachHuang, Jennifer, MD    Family History No family history on file.  Social  History Social History   Tobacco Use  . Smoking status: Current Every Day Smoker    Packs/day: 0.10    Years: 5.00    Pack years: 0.50    Types: Cigarettes  . Smokeless tobacco: Never Used  . Tobacco comment: cutting back - now 3-4 cigarettes per day  Substance Use Topics  . Alcohol use: No  . Drug use: Yes    Types: Marijuana    Comment: ~3 times per week     Allergies   Vicodin [hydrocodone-acetaminophen]; Eggs or egg-derived products; and Lactose intolerance (gi)   Review of Systems Review of Systems  Constitutional: Positive for fatigue. Negative for chills and fever.  HENT: Positive for congestion.   Eyes: Negative for visual disturbance.  Respiratory: Negative for shortness of breath.   Cardiovascular: Negative for chest pain.  Gastrointestinal: Negative for abdominal pain and vomiting.  Endocrine: Positive for polydipsia.  Genitourinary: Negative for dysuria and flank pain.  Musculoskeletal: Negative for back pain, neck pain and neck stiffness.  Skin: Negative for rash.  Neurological: Negative for light-headedness and headaches.     Physical Exam Updated Vital Signs BP (!) 131/100   Pulse (!) 109   Temp 97.6 F (36.4 C) (Oral)   Resp 18   Ht 5\' 7"  (1.702 m)   Wt 90.7 kg (200 lb)   SpO2 97%   BMI 31.32 kg/m   Physical Exam  Constitutional: He is oriented to person, place, and  time. He appears well-developed and well-nourished.  HENT:  Head: Normocephalic and atraumatic.  Mouth/Throat: Mucous membranes are dry. No tonsillar exudate (no unilateral edema).  Dry mucous membranes  Eyes: Conjunctivae are normal. Right eye exhibits no discharge. Left eye exhibits no discharge.  Neck: Normal range of motion. Neck supple. No tracheal deviation present.  Cardiovascular: Regular rhythm. Tachycardia present.  Pulmonary/Chest: Effort normal and breath sounds normal.  Abdominal: Soft. He exhibits no distension. There is no tenderness. There is no guarding.   Musculoskeletal: He exhibits no edema.  Neurological: He is alert and oriented to person, place, and time. No cranial nerve deficit. GCS eye subscore is 4. GCS verbal subscore is 5. GCS motor subscore is 6.  Mild sleepy however easily arousable to verbal discussion.  Skin: Skin is warm. No rash noted.  Psychiatric: He has a normal mood and affect.  Nursing note and vitals reviewed.    ED Treatments / Results  Labs (all labs ordered are listed, but only abnormal results are displayed) Labs Reviewed  BASIC METABOLIC PANEL - Abnormal; Notable for the following components:      Result Value   Sodium 124 (*)    Chloride 88 (*)    Glucose, Bld 937 (*)    BUN 5 (*)    All other components within normal limits  URINALYSIS, ROUTINE W REFLEX MICROSCOPIC - Abnormal; Notable for the following components:   Color, Urine COLORLESS (*)    Glucose, UA >=500 (*)    All other components within normal limits  CBG MONITORING, ED - Abnormal; Notable for the following components:   Glucose-Capillary >600 (*)    All other components within normal limits  RAPID STREP SCREEN (NOT AT [facility])  CBC  BASIC METABOLIC PANEL  I-STAT VENOUS BLOOD GAS, ED  CBG MONITORING, ED  CBG MONITORING, ED    EKG  EKG Interpretation None       Radiology Dg Chest 2 View  Result Date: 09/23/2017 CLINICAL DATA:  Cough and sore throat.  Hypoglycemia. EXAM: CHEST  2 VIEW COMPARISON:  07/24/2017 FINDINGS: The heart size and mediastinal contours are within normal limits. Both lungs are clear. The visualized skeletal structures are unremarkable. IMPRESSION: No active cardiopulmonary disease. Electronically Signed   By: Tollie Ethavid  Kwon M.D.   On: 09/23/2017 21:36    Procedures .Critical Care Performed by: Blane OharaZavitz, Clarence Dunsmore, MD Authorized by: Blane OharaZavitz, Tonee Silverstein, MD   Critical care provider statement:    Critical care time (minutes):  35   Critical care start time:  09/23/2017 8:14 PM   Critical care end time:  09/23/2017 8:49  PM   Critical care time was exclusive of:  Separately billable procedures and treating other patients and teaching time   Critical care was necessary to treat or prevent imminent or life-threatening deterioration of the following conditions:  Endocrine crisis and metabolic crisis   Critical care was time spent personally by me on the following activities:  Evaluation of patient's response to treatment, examination of patient, ordering and performing treatments and interventions, ordering and review of laboratory studies and ordering and review of radiographic studies   I assumed direction of critical care for this patient from another provider in my specialty: no     (including critical care time)  Medications Ordered in ED Medications  insulin regular (NOVOLIN R,HUMULIN R) 100 Units in sodium chloride 0.9 % 100 mL (1 Units/mL) infusion (not administered)  sodium chloride 0.9 % bolus 1,000 mL (1,000 mLs Intravenous New Bag/Given 09/23/17 2146)  0.9 % NaCl with KCl 20 mEq/ L  infusion ( Intravenous New Bag/Given 09/23/17 2146)     Initial Impression / Assessment and Plan / ED Course  I have reviewed the triage vital signs and the nursing notes.  Pertinent labs & imaging results that were available during my care of the patient were reviewed by me and considered in my medical decision making (see chart for details).    Patient presents with uncontrolled glucose with diabetes history. Discussed at length risks and benefits of properly controlling glucose levels. Plan for IV fluids, insulin drip and admission to the hospital. Hourly point-of-care glucose and repeat labs ordered. No gap at this time. Labs reviewed, Na low expectedly due to hyperglycemia.   The patients results and plan were reviewed and discussed.   Any x-rays performed were independently reviewed by myself.   Differential diagnosis were considered with the presenting HPI.  Medications  insulin regular (NOVOLIN R,HUMULIN R)  100 Units in sodium chloride 0.9 % 100 mL (1 Units/mL) infusion (not administered)  sodium chloride 0.9 % bolus 1,000 mL (1,000 mLs Intravenous New Bag/Given 09/23/17 2146)  0.9 % NaCl with KCl 20 mEq/ L  infusion ( Intravenous New Bag/Given 09/23/17 2146)    Vitals:   09/23/17 1843 09/23/17 2030 09/23/17 2045 09/23/17 2100  BP: (!) 137/98 (!) 144/106 (!) 165/103 (!) 131/100  Pulse: (!) 113 (!) 104  (!) 109  Resp: 18   18  Temp: 97.6 F (36.4 C)     TempSrc: Oral     SpO2: 97% 97%  97%  Weight: 90.7 kg (200 lb)     Height: 5\' 7"  (1.702 m)       Final diagnoses:  Sore throat  Type 2 diabetes mellitus with hyperglycemia, with long-term current use of insulin (HCC)    Admission/ observation were discussed with the admitting physician, patient and/or family and they are comfortable with the plan.    Final Clinical Impressions(s) / ED Diagnoses   Final diagnoses:  Sore throat  Type 2 diabetes mellitus with hyperglycemia, with long-term current use of insulin [facility])    ED Discharge Orders    None       Blane Ohara, MD 09/23/17 2150

## 2017-09-23 NOTE — ED Triage Notes (Addendum)
C/o blood glucose 600-700 today.  Also reports sore throat and feeling like throat is closing x 3 days.  States he has been taking his diabetes medications.

## 2017-09-23 NOTE — ED Notes (Addendum)
Pt reports that his sugar is high. Pt also endorses a sore throat.

## 2017-09-23 NOTE — H&P (Signed)
Date: 09/23/2017               Patient Name:  Roger HartshornGregory Bertholf MRN: [id number]  DOB: 08-01-1991 Age / Sex: 26 y.o., male   PCP: [name], [name], MD         Medical Service: Internal Medicine Teaching Service         Attending Physician: Dr. Oswaldo DoneVincent, Marquita Palmsuncan Thomas, *    First Contact: Dr. Caron PresumeHelberg Pager: [phone]  Second Contact: Dr. Antony ContrasGuilloud Pager: [phone]       After Hours (After 5p/  First Contact Pager: [phone]  weekends / holidays): Second Contact Pager: [phone]   Chief Complaint: hyperglycemia  History of Present Illness:  26 yo male PMHx significant for uncontrolled T2DM presenting with a chief complaint of hyperglycemia. He endorses 1 day of polydypsia, polyuria, weakness, fatigue/grogginess, and blurred vision, which are very similar symptoms he experienced on his prior admission. He also states he has been experiencing 2 days of cough, sore throat, and rhinorrhea. He states he had a BG of ~600 yesterday and ~400 on the day of admission. He denies nausea, vomiting, diarrhea, shortness of breath, or CP.   Most recent admission was approximately one month ago.  The patient left the hospital prior to optimal titration of insulin due to family emergency. He was discharged on Lantus 50 qhs and Novolog 15 units TID with meals, as well as 500 mg of metformin BID. [facility]   The patient states he has been compliant with his medications and recently ran out of his metformin and BP meds, but states he has been taking the 50 units of lantus at night as well as 15 units of novolog with meals.   ED course:  Vitals: BP 128/85, HR 113, temperature 97.6 F (36.4 C), RR 18, SpO2 96 %. Labs pertinent for glucose 937, Na corrected 137, anion gap 13; UA negative.  Received 1 liter bolus of NS and started on IV insulin.  Meds:  Current Meds  Medication Sig  . insulin aspart (NOVOLOG) 100 UNIT/ML injection Inject 15 Units into the skin 3 (three) times daily with  meals.  . insulin glargine (LANTUS) 100 UNIT/ML injection Inject 0.5 mLs (50 Units total) into the skin at bedtime.  Marland Kitchen. lisinopril (PRINIVIL,ZESTRIL) 40 MG tablet Take 1 tablet (40 mg total) by mouth daily.  . metFORMIN (GLUCOPHAGE) 500 MG tablet Take 1 tablet (500 mg total) by mouth 2 (two) times daily with a meal.     Allergies: Allergies as of 09/23/2017 - Review Complete 09/23/2017  Allergen Reaction Noted  . Vicodin [hydrocodone-acetaminophen] Anaphylaxis 01/22/2014  . Eggs or egg-derived products Nausea And Vomiting 07/25/2017  . Lactose intolerance (gi) Rash 01/22/2014   Past Medical History:  Diagnosis Date  . Asthma   . DKA (diabetic ketoacidoses) (HCC) 06/2017  . Obesity     Family History:   Social History:  Social History   Tobacco Use  . Smoking status: Current Every Day Smoker    Packs/day: 0.10    Years: 5.00    Pack years: 0.50    Types: Cigarettes  . Smokeless tobacco: Never Used  . Tobacco comment: cutting back - now 3-4 cigarettes per day  Substance Use Topics  . Alcohol use: No  . Drug use: Yes    Types: Marijuana    Comment: ~3 times per week    Review of Systems: A complete ROS was negative except as per HPI.   Physical  Exam: Blood pressure 128/85, pulse (!) 113, temperature 97.6 F (36.4 C), temperature source Oral, resp. rate 18, height 5\' 7"  (1.702 m), weight 200 lb (90.7 kg), SpO2 96 %. Physical Exam  Constitutional: He is oriented to person, place, and time. He appears well-developed and well-nourished. No distress.  HENT:  Head: Normocephalic and atraumatic.  Mouth/Throat: Uvula is midline. Posterior oropharyngeal erythema present. No oropharyngeal exudate or tonsillar abscesses.  Cardiovascular: Regular rhythm, normal heart sounds and intact distal pulses. Tachycardia present.  Pulmonary/Chest: Effort normal and breath sounds normal.  Abdominal: Soft. Bowel sounds are normal. There is no tenderness.  Musculoskeletal: Normal range of  motion. He exhibits no edema.  Neurological: He is alert and oriented to person, place, and time.  Skin: Skin is warm and dry.    EKG: none to review  CXR: personally reviewed my interpretation is negative for acute cardiopulmonary disease.   Assessment & Plan by Problem: Active Problems:   Hyperglycemia  Hyperglycemia,  Uncontrolled T2DM BG on admission 937. No anion gap metabolic acidosis. Patient w/o AMS but is fatigued and lethargic more consistent with HHS. Patient reports compliance with with Lantus and Novolog but ran out of metformin several days ago. Most recent A1c in 06/2017 10.5. Na 137 corrected, K 3.9 on admission.  -IV insulin until BG <200 and will remain on IV for 2 hours after BG reaches <200, will start Lantus 50 units at this time with SSI moderate coverage, can give diet at this time -BMET q4 hours -IVF: NS 250 cc/hr w/ 20 meq K+  HTN Currently normotensive.  -Holding Lisinopril 40 mg  -Continue to monitor    Dispo: Admit patient to Observation with expected length of stay less than 2 midnights.  Signed: Toney RakesLacroce, Leighton Brickley J, MD 09/23/2017, 9:58 PM  Pager: [phone]

## 2017-09-24 ENCOUNTER — Encounter (HOSPITAL_COMMUNITY): Payer: Self-pay | Admitting: General Practice

## 2017-09-24 ENCOUNTER — Other Ambulatory Visit: Payer: Self-pay

## 2017-09-24 DIAGNOSIS — E1165 Type 2 diabetes mellitus with hyperglycemia: Principal | ICD-10-CM

## 2017-09-24 DIAGNOSIS — E876 Hypokalemia: Secondary | ICD-10-CM

## 2017-09-24 DIAGNOSIS — E669 Obesity, unspecified: Secondary | ICD-10-CM

## 2017-09-24 DIAGNOSIS — Z794 Long term (current) use of insulin: Secondary | ICD-10-CM

## 2017-09-24 DIAGNOSIS — Z79899 Other long term (current) drug therapy: Secondary | ICD-10-CM

## 2017-09-24 DIAGNOSIS — R739 Hyperglycemia, unspecified: Secondary | ICD-10-CM

## 2017-09-24 DIAGNOSIS — I10 Essential (primary) hypertension: Secondary | ICD-10-CM

## 2017-09-24 HISTORY — DX: Hyperglycemia, unspecified: R73.9

## 2017-09-24 LAB — BASIC METABOLIC PANEL
Anion gap: 10 (ref 5–15)
Anion gap: 9 (ref 5–15)
Anion gap: 9 (ref 5–15)
BUN: 5 mg/dL — AB (ref 6–20)
CALCIUM: 8.8 mg/dL — AB (ref 8.9–10.3)
CO2: 26 mmol/L (ref 22–32)
CO2: 23 mmol/L (ref 22–32)
CO2: 24 mmol/L (ref 22–32)
CREATININE: 0.74 mg/dL (ref 0.61–1.24)
CREATININE: 0.59 mg/dL — AB (ref 0.61–1.24)
CREATININE: 0.71 mg/dL (ref 0.61–1.24)
Calcium: 9.3 mg/dL (ref 8.9–10.3)
Calcium: 8.1 mg/dL — ABNORMAL LOW (ref 8.9–10.3)
Chloride: 97 mmol/L — ABNORMAL LOW (ref 101–111)
Chloride: 103 mmol/L (ref 101–111)
Chloride: 102 mmol/L (ref 101–111)
GFR calc Af Amer: >60 mL/min (ref >60)
GFR calc Af Amer: >60 mL/min (ref >60)
GFR calc Af Amer: >60 mL/min (ref >60)
Glucose, Bld: 367 mg/dL — ABNORMAL HIGH (ref 65–99)
Glucose, Bld: 205 mg/dL — ABNORMAL HIGH (ref 65–99)
Glucose, Bld: 336 mg/dL — ABNORMAL HIGH (ref 65–99)
POTASSIUM: 3 mmol/L — AB (ref 3.5–5.1)
Potassium: 3.8 mmol/L (ref 3.5–5.1)
Potassium: 3.3 mmol/L — ABNORMAL LOW (ref 3.5–5.1)
SODIUM: 133 mmol/L — AB (ref 135–145)
SODIUM: 135 mmol/L (ref 135–145)
SODIUM: 135 mmol/L (ref 135–145)

## 2017-09-24 LAB — CBG MONITORING, ED
GLUCOSE-CAPILLARY: 338 mg/dL — AB (ref 65–99)
GLUCOSE-CAPILLARY: 275 mg/dL — AB (ref 65–99)
GLUCOSE-CAPILLARY: 188 mg/dL — AB (ref 65–99)
Glucose-Capillary: 159 mg/dL — ABNORMAL HIGH (ref 65–99)
Glucose-Capillary: 223 mg/dL — ABNORMAL HIGH (ref 65–99)
Glucose-Capillary: 256 mg/dL — ABNORMAL HIGH (ref 65–99)
Glucose-Capillary: 398 mg/dL — ABNORMAL HIGH (ref 65–99)

## 2017-09-24 LAB — GLUCOSE, CAPILLARY
GLUCOSE-CAPILLARY: 284 mg/dL — AB (ref 65–99)
Glucose-Capillary: 330 mg/dL — ABNORMAL HIGH (ref 65–99)

## 2017-09-24 MED ORDER — INSULIN ASPART 100 UNIT/ML ~~LOC~~ SOLN
15.0000 [IU] | Freq: Three times a day (TID) | SUBCUTANEOUS | Status: DC
  Administered 2017-09-24: 15 [IU] via SUBCUTANEOUS

## 2017-09-24 MED ORDER — INSULIN ASPART 100 UNIT/ML ~~LOC~~ SOLN
0.0000 [IU] | Freq: Three times a day (TID) | SUBCUTANEOUS | Status: DC
  Administered 2017-09-24: 8 [IU] via SUBCUTANEOUS
  Filled 2017-09-24: qty 1

## 2017-09-24 MED ORDER — POTASSIUM CHLORIDE 10 MEQ/100ML IV SOLN
10.0000 meq | INTRAVENOUS | Status: AC
  Administered 2017-09-24 (×2): 10 meq via INTRAVENOUS
  Filled 2017-09-24 (×2): qty 100

## 2017-09-24 MED ORDER — MENTHOL 3 MG MT LOZG: 1.0000 | LOZENGE | OROMUCOSAL | Status: DC | PRN

## 2017-09-24 MED ORDER — INSULIN ASPART 100 UNIT/ML ~~LOC~~ SOLN
0.0000 [IU] | Freq: Three times a day (TID) | SUBCUTANEOUS | Status: DC
  Administered 2017-09-24: 11 [IU] via SUBCUTANEOUS

## 2017-09-24 MED ORDER — INSULIN GLARGINE 100 UNIT/ML ~~LOC~~ SOLN
50.0000 [IU] | Freq: Every day | SUBCUTANEOUS | Status: DC
  Administered 2017-09-24: 50 [IU] via SUBCUTANEOUS
  Filled 2017-09-24: qty 0.5

## 2017-09-24 MED ORDER — DEXTROSE-NACL 5-0.45 % IV SOLN: INTRAVENOUS | Status: DC

## 2017-09-24 MED ORDER — ENOXAPARIN SODIUM 40 MG/0.4ML ~~LOC~~ SOLN
40.0000 mg | SUBCUTANEOUS | Status: DC
  Administered 2017-09-24 – 2017-09-25 (×2): 40 mg via SUBCUTANEOUS
  Filled 2017-09-24 (×3): qty 0.4

## 2017-09-24 MED ORDER — SODIUM CHLORIDE 0.9 % IV SOLN
INTRAVENOUS | Status: DC
  Administered 2017-09-24 – 2017-09-25 (×3): via INTRAVENOUS

## 2017-09-24 MED ORDER — INSULIN ASPART 100 UNIT/ML ~~LOC~~ SOLN: 0.0000 [IU] | Freq: Every day | SUBCUTANEOUS | Status: DC

## 2017-09-24 MED ORDER — SODIUM CHLORIDE 0.9 % IV BOLUS (SEPSIS)
1000.0000 mL | Freq: Once | INTRAVENOUS | Status: AC
  Administered 2017-09-24: 1000 mL via INTRAVENOUS

## 2017-09-24 MED ORDER — INSULIN ASPART PROT & ASPART (70-30 MIX) 100 UNIT/ML ~~LOC~~ SUSP
45.0000 [IU] | Freq: Two times a day (BID) | SUBCUTANEOUS | Status: DC
  Administered 2017-09-24: 45 [IU] via SUBCUTANEOUS
  Filled 2017-09-24: qty 10

## 2017-09-24 MED ORDER — INSULIN ASPART 100 UNIT/ML ~~LOC~~ SOLN
0.0000 [IU] | Freq: Every day | SUBCUTANEOUS | Status: DC
  Administered 2017-09-24: 5 [IU] via SUBCUTANEOUS

## 2017-09-24 MED ORDER — SODIUM CHLORIDE 0.9 % IV SOLN
INTRAVENOUS | Status: DC
  Administered 2017-09-24: via INTRAVENOUS

## 2017-09-24 MED ORDER — INSULIN ASPART 100 UNIT/ML ~~LOC~~ SOLN
0.0000 [IU] | Freq: Three times a day (TID) | SUBCUTANEOUS | Status: DC
  Administered 2017-09-25: 11 [IU] via SUBCUTANEOUS

## 2017-09-24 MED ORDER — IBUPROFEN 200 MG PO TABS
600.0000 mg | ORAL_TABLET | Freq: Four times a day (QID) | ORAL | Status: DC | PRN
  Administered 2017-09-24: 600 mg via ORAL
  Filled 2017-09-24: qty 3

## 2017-09-24 MED ORDER — SODIUM CHLORIDE 0.9 % IV SOLN
INTRAVENOUS | Status: AC
  Filled 2017-09-24: qty 1

## 2017-09-24 NOTE — Progress Notes (Signed)
   Subjective: Doing well this AM. He states that he is complaint with all his medications. He has felt sick the last several days but denies a decrease or not taking his insulin. He still has some medications. He stores his insulin in the refrigerator and alternates his injection site. We discussed that we will make adjustments to his insulin regimen and then likely discharge tomorrow. All questions and concerns addressed.   Objective: Vital signs in last 24 hours: Vitals:   09/24/17 0300 09/24/17 0315 09/24/17 0330 09/24/17 0345  BP: (!) 144/91  133/88   Pulse: 93 91 92 90  Resp: (!) 25 (!) 25 (!) 25 (!) 23  Temp:      TempSrc:      SpO2: 97% 97% 97% 96%  Weight:      Height:       General: Obese male in no acute distress  Pulm: Good air movement with no wheezing or crackles  CV: RRR, no murmurs, no rubs Abdomen: Active bowel sounds, soft, no tenderness to palpation  Extremities: No LE edema   Assessment/Plan:  Hyperglycemia, Uncontrolled DM  - CBG have come down below 200 and patient has been transitioned to SubQ insulin  - Restarted Lantus 50 units and Novolog 15 units with meals + SSI  - Diabetes educator consulted - Will consider splitting his Lantus to 30 units BID   Hypertension  - Normotensive  - Can restart lisinopril 40 mg QD if needed.   Hypokalemia  - K 3.3, replace   Dispo: Anticipated discharge in approximately 1 day(s).   Levora DredgeHelberg, Saloni Lablanc, MD 09/24/2017, 5:33 AM Pager: My Pager: [phone]

## 2017-09-24 NOTE — Progress Notes (Signed)
Inpatient Diabetes Program Recommendations  AACE/ADA: New Consensus Statement on Inpatient Glycemic Control (2015)  Target Ranges:  Prepandial:   less than 140 mg/dL      Peak postprandial:   less than 180 mg/dL (1-2 hours)      Critically ill patients:  140 - 180 mg/dL   Lab Results  Component Value Date   GLUCAP 330 (H) 09/24/2017   HGBA1C 10.5 (H) 07/25/2017    Review of Glycemic Control Results for Roger HartshornMCKINNIES, [name] (MRN [id number]) as of 09/24/2017 13:02  Ref. Range 09/24/2017 05:17 09/24/2017 06:28 09/24/2017 08:09 09/24/2017 09:34 09/24/2017 11:44  Glucose-Capillary Latest Ref Range: 65 - 99 mg/dL 284188 (H) 132159 (H) 440256 (H)  330 (H)   Diabetes history: Type 2 DM with possible HSS Outpatient Diabetes medications: Metformin 500mg  bid, Lantus 50 Units HS, Novolog 15 Units tid with meals. Current orders for Inpatient glycemic control: Novolog 0-15 Units Moderate Correction, Novolog 0-5 Units HS, Novolog 15 Units tid with meals, Lantus 50 Units HS  Inpatient Diabetes Program Recommendations:    After speaking with the patient, it is clear that he is unable to afford Lantus, Metformin and Novolog. Please consider 40 Units of 70/30 BID as an alternative to the current regimen. Patient has a meter and knows how to use it, but states, " I am out of the test strips, so it has been a while since I have been able to check my sugar." Patient admits to not taking insulin to meet blood sugar demands. He is in between jobs right and unable to afford it.  Spoke with case management regarding medication assistance and she is aware that he does not have insurance. Will plan to follow up tomorrow.  When patient is discharged please consider Novolin 70/30. [facility].  Thanks,  Lujean RaveLauren Kalayah Leske, MSN, RNC-OB Diabetes Coordinator [phone] (8a-5p)

## 2017-09-24 NOTE — ED Notes (Signed)
Nurse drawing labs. 

## 2017-09-24 NOTE — Progress Notes (Signed)
  Subjective: Patient says he is tired but that he feels better today. He says he has been taking his Novolog and Lantus and will go through a pen in about a week. He does not report increased carbohydrate intake recently.  Objective: Vital signs in last 24 hours: Vitals:   09/24/17 0930 09/24/17 0941 09/24/17 1000 09/24/17 1038  BP: (!) 119/44 (!) 119/44 (!) 146/91 130/72  Pulse: 82 92 (!) 103   Resp:  (!) 22  16  Temp:      TempSrc:    Oral  SpO2: 97% 96%  98%  Weight:      Height:       Weight change:   Intake/Output Summary (Last 24 hours) at 09/24/2017 1139 Last data filed at 09/24/2017 1100 Gross per 24 hour  Intake 1120 ml  Output 700 ml  Net 420 ml   General appearance: Alert and oriented, lying in bed and appears comfortable. Abdomen: Non-tender, non-distended  Micro Results: Recent Results (from the past 240 hour(s))  Rapid strep screen     Status: None   Collection Time: 09/23/17 10:03 PM  Result Value Ref Range Status   Streptococcus, Group A Screen (Direct) NEGATIVE NEGATIVE Final    Comment: (NOTE) A Rapid Antigen test may result negative if the antigen level in the sample is below the detection level of this test. The FDA has not cleared this test as a stand-alone test therefore the rapid antigen negative result has reflexed to a Group A Strep culture.    Medications:  Scheduled: . enoxaparin (LOVENOX) injection  40 mg Subcutaneous Q24H  . insulin aspart  0-15 Units Subcutaneous TID WC  . insulin aspart  0-5 Units Subcutaneous QHS  . insulin aspart  15 Units Subcutaneous TID WC  . insulin glargine  50 Units Subcutaneous QHS   Scheduled Meds: . enoxaparin (LOVENOX) injection  40 mg Subcutaneous Q24H  . insulin aspart  0-15 Units Subcutaneous TID WC  . insulin aspart  0-5 Units Subcutaneous QHS  . insulin aspart  15 Units Subcutaneous TID WC  . insulin glargine  50 Units Subcutaneous QHS   Continuous Infusions: . sodium chloride 125 mL/hr at  09/24/17 1115   Assessment/Plan: Active Problems:   Hyperglycemia  [name] is a 26 year old male with PMH significant for uncontrolled T2DM admitted for hyperglycemia. This is likely HHS given his glucose of 937 on admission, lack of ketones, and similar episodes in the past. Glucose has improved from admission but is still greater than 300.     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  The care of the patient was discussed with Dr. Caron PresumeHelberg and the assessment and plan formulated with their assistance.  Please see their attached note for official documentation of the daily encounter.   LOS: 0 days   Despina PoleMcEwan, [name], Medical Student 09/24/2017, 11:39 AM

## 2017-09-25 LAB — BASIC METABOLIC PANEL
ANION GAP: 6 (ref 5–15)
BUN: <5 mg/dL — ABNORMAL LOW (ref 6–20)
CHLORIDE: 107 mmol/L (ref 101–111)
CO2: 23 mmol/L (ref 22–32)
Calcium: 8 mg/dL — ABNORMAL LOW (ref 8.9–10.3)
Creatinine, Ser: 0.61 mg/dL (ref 0.61–1.24)
GFR calc non Af Amer: >60 mL/min (ref >60)
Glucose, Bld: 318 mg/dL — ABNORMAL HIGH (ref 65–99)
POTASSIUM: 3.6 mmol/L (ref 3.5–5.1)
SODIUM: 136 mmol/L (ref 135–145)

## 2017-09-25 LAB — GLUCOSE, CAPILLARY
GLUCOSE-CAPILLARY: 399 mg/dL — AB (ref 65–99)
GLUCOSE-CAPILLARY: 345 mg/dL — AB (ref 65–99)
Glucose-Capillary: 227 mg/dL — ABNORMAL HIGH (ref 65–99)
Glucose-Capillary: 328 mg/dL — ABNORMAL HIGH (ref 65–99)

## 2017-09-25 MED ORDER — INSULIN ASPART PROT & ASPART (70-30 MIX) 100 UNIT/ML ~~LOC~~ SUSP
60.0000 [IU] | Freq: Two times a day (BID) | SUBCUTANEOUS | Status: DC
  Filled 2017-09-25: qty 10

## 2017-09-25 MED ORDER — INSULIN ASPART PROT & ASPART (70-30 MIX) 100 UNIT/ML ~~LOC~~ SUSP
50.0000 [IU] | Freq: Two times a day (BID) | SUBCUTANEOUS | Status: DC
  Administered 2017-09-25 (×2): 50 [IU] via SUBCUTANEOUS
  Filled 2017-09-25: qty 10

## 2017-09-25 MED ORDER — METFORMIN HCL 500 MG PO TABS: 500.0000 mg | ORAL_TABLET | Freq: Two times a day (BID) | ORAL | Status: DC

## 2017-09-25 NOTE — Progress Notes (Signed)
Pt reported to this nurse that he wanted to go home.  Spoke with Md who did come to see pt in his room but he still requested to go home. Pt educated, signed out AMA.

## 2017-09-25 NOTE — Progress Notes (Signed)
  Subjective: Patient reports feeling well this morning and he does not have any complaints. He was able to eat last night and he is getting up to go to the bathroom.   Objective: Vital signs in last 24 hours: Vitals:   09/24/17 2141 09/25/17 0058 09/25/17 0510 09/25/17 0811  BP: 125/67 129/79 108/63 122/71  Pulse: 84 77 77 74  Resp: 20 20 20 20   Temp: 98.9 F (37.2 C) 98.6 F (37 C) 98.2 F (36.8 C) 98.1 F (36.7 C)  TempSrc: Oral Oral Oral Oral  SpO2: 99% 98% 99% 99%  Weight:      Height:       Weight change:   Intake/Output Summary (Last 24 hours) at 09/25/2017 1107 Last data filed at 09/25/2017 0802 Gross per 24 hour  Intake 3418.78 ml  Output -  Net 3418.78 ml   General appearance: Alert and oriented, lying in bed and appears comfortable. HEENT: MMM Abdomen: Non-tender, non-distended  Micro Results: Recent Results (from the past 240 hour(s))  Rapid strep screen     Status: None   Collection Time: 09/23/17 10:03 PM  Result Value Ref Range Status   Streptococcus, Group A Screen (Direct) NEGATIVE NEGATIVE Final    Comment: (NOTE) A Rapid Antigen test may result negative if the antigen level in the sample is below the detection level of this test. The FDA has not cleared this test as a stand-alone test therefore the rapid antigen negative result has reflexed to a Group A Strep culture.   Culture, group A strep     Status: None (Preliminary result)   Collection Time: 09/23/17 10:03 PM  Result Value Ref Range Status   Specimen Description THROAT  Final   Special Requests NONE Reflexed from [id number]  Final   Culture TOO YOUNG TO READ  Final   Report Status PENDING  Incomplete   Medications:  Scheduled: . enoxaparin (LOVENOX) injection  40 mg Subcutaneous Q24H  . insulin aspart protamine- aspart  50 Units Subcutaneous BID WC   Scheduled Meds: . enoxaparin (LOVENOX) injection  40 mg Subcutaneous Q24H  . insulin aspart protamine- aspart  50 Units Subcutaneous BID  WC   Continuous Infusions:  Assessment/Plan: Active Problems:   Hyperglycemia  [name] is a 26 year old male with PMH significant for uncontrolled T2DM admitted for hyperglycemia. This is likely HHS given his glucose of 937 on admission, lack of ketones, and similar episodes in the past. Glucose has improved from admission but is still greater than 300. He reported not being able to afford his medications so he was switched to Novolog 70/30 as a more affordable option.      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 Glucose was trending down but remains stable around 300. Would like to continue monitoring glucose throughout today given recent insulin changes. - Novolog 70/30 50 units BID - Working with pharmacy to obtain medication and supplies  Hypertension - Holding home lisinopril  - Continue to monitor  This is a Psychologist, occupationalMedical Student Note.  The care of the patient was discussed with Dr. Caron PresumeHelberg and the assessment and plan formulated with their assistance.  Please see their attached note for official documentation of the daily encounter.   LOS: 1 day   Despina PoleMcEwan, Opel Lejeune, Medical Student 09/25/2017, 11:07 AM

## 2017-09-25 NOTE — Plan of Care (Signed)
  Nutrition Education Note  RD consulted for nutrition education regarding diabetes.  Spoke with patient regarding PO intake PTA. Patient states he was in a situation, living with someone was not eating healthy, but is out of his situation now and wants to eat healthier and begin working out. Discussed with patient his plan moving forward. He talked about eating salads. Discussed with patient diabetes plate method and the importance of portion sizes.  Lab Results  Component Value Date   HGBA1C 10.5 (H) 07/25/2017    RD provided "Carbohydrate Counting for People with Diabetes" handout from the Academy of Nutrition and Dietetics. Discussed different food groups and their effects on blood sugar, emphasizing carbohydrate-containing foods. Provided list of carbohydrates and recommended serving sizes of common foods.  Discussed importance of controlled and consistent carbohydrate intake throughout the day. Provided examples of ways to balance meals/snacks and encouraged intake of high-fiber, whole grain complex carbohydrates. Teach back method used.  Expect fair compliance.  Body mass index is 31.32 kg/m. Pt meets criteria for obese class I based on current BMI.  Current diet order is carb modified, patient is consuming approximately 100% of meals at this time. Labs and medications reviewed. No further nutrition interventions warranted at this time. RD contact information provided. If additional nutrition issues arise, please re-consult RD.  Roger AnoWilliam M. Geralene Afshar, MS, RD LDN Inpatient Clinical Dietitian Pager [phone]

## 2017-09-25 NOTE — Progress Notes (Signed)
Met with patient yesterday after speaking to diabetes coordinator. Pt see [facility] as his PCP. Pt wishes to continue with them if able to work out a solution for medication assistance. CM unable to obtain him an appointment at one of the [facility] so he can use [facility] pharmacy until after Dec. 1st.  Pt to switch to Novolog 70/30. This is about $25/ bottle at [facility]. Pt states he is also having a hard time affording his test strips. CM will provide him info on the [employer] meter and strips at [facility].  CM following for d/c meds and further assistance needed.

## 2017-09-25 NOTE — Progress Notes (Signed)
   Subjective: Doing well this AM and feels he is back to his baseline. Is requesting to go home. Discussed that we need to optimize his insulin regimen and ensure he is able to get the medications at an affordable price. He said initially he obtained his medications for $36/month which was affordable but after that the price went up exponentially and was unable to afford them thereafter. We will discuss with the clinic to see if we can get is medication at a discount price. This would be beneficial to prevent future readmissions. All questions and concerns addressed.    Objective: Vital signs in last 24 hours: Vitals:   09/24/17 1410 09/24/17 1758 09/24/17 2141 09/25/17 0058  BP: 126/68 133/81 125/67 129/79  Pulse: 77 86 84 77  Resp: 20 20 20 20   Temp: 98.2 F (36.8 C) 98.5 F (36.9 C) 98.9 F (37.2 C) 98.6 F (37 C)  TempSrc: Oral Oral Oral Oral  SpO2: 98% 100% 99% 98%  Weight:      Height:       General: Obese male in no acute distress Pulm: Good air movement with no wheezing and crackles CV: RRR, no murmurs, no rubs  Abdomen: Active bowel sounds with no tenderness to palpation  Extremities: No LE edema   Assessment/Plan:  Hyperglycemia, Uncontrolled DM  - Approximately 6L NS given since admission  - Patient unable to obtain prescribed insulin due to financial restraints - Transitioned from Lantus and Novolog to Novolin 70/30  - Currently on Novolin 70/30 50 units BID with meals  - Will talk with clinic to see what kind of medication assistance we can do   Hypertension  - Normotensive  - Can restart lisinopril 40 mg QD if needed.   Hypokalemia  - K 3.3, replace   Dispo: Anticipated discharge in approximately 0-1 day(s).   Levora DredgeHelberg, Lakasha Mcfall, MD 09/25/2017, 5:00 AM My Pager: [phone]

## 2017-09-25 NOTE — Progress Notes (Signed)
Inpatient Diabetes Program Recommendations  AACE/ADA: New Consensus Statement on Inpatient Glycemic Control (2015)  Target Ranges:  Prepandial:   less than 140 mg/dL      Peak postprandial:   less than 180 mg/dL (1-2 hours)      Critically ill patients:  140 - 180 mg/dL   Lab Results  Component Value Date   GLUCAP 345 (H) 09/25/2017   HGBA1C 10.5 (H) 07/25/2017    Review of Glycemic Control Results for [name], [name] (MRN [id number]) as of 09/25/2017 09:23  Ref. 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 Current orders for Inpatient glycemic control: Novolog Mix 70/30 50 Units BID with meals  Inpatient Diabetes Program Recommendations:    Still noting fastings are >300's even with medication adjustment. Consider adding Metformin 500 mg BID and increasing 70/30 to 60 Units BID. Will plan to come today and educate patient and provide support.   Spoke with patient regarding plan of care when discharged. Patient has an appointment scheduled with the internal medicine clinic. Has vial of 70/30 ready for discharge as well as a [employer] discount card. Educated patient on the need to have three meals/day while on 70/30 insulin, should not be skipping meals, and should hold insulin if NPO. Also, discussed lifestyle modifications that include exercise, healthy diet, drinking more water, reducing sugar intake and reading labels for carbohydrates. In addition, reviewed the importance of insulin requirements given his resistance. Patient verbalizes understanding and seems agreeable to making modifications.   Thanks, Lujean RaveLauren Sherrell Farish, MSN, RNC-OB Diabetes Coordinator [phone] (8a-5p)

## 2017-09-25 NOTE — Discharge Summary (Signed)
   Name: Roger HartshornGregory Lanza MRN: [id number] DOB: Sep 17, 1991 26 y.o. PCP: [name], [name], MD  Date of Admission: 09/23/2017  8:02 PM Date of Discharge: 09/25/2017 Attending Physician: Tyson AliasVincent, Duncan Thomas, *  LEFT AGAINST MEDICAL ADVICE  Discharge Diagnosis: 1. Hyperglycemic Hyperosmolar Syndrome (HHS)  Active Problems:   Hyperglycemia  Discharge Medications: Novolin 70/30 50 units BID  Metformin 500 mg BID Lisinopril 40 mg QD  Disposition and follow-up:   [name]  At the hospital follow up visit please address:  1.  His insulin regimen. Determine if he needs to increase his Novolin 70/30 dosages and titrate up his metformin dosage. He is limited on medication selection due to financial restraint so discuss options to get his medications at a cheaper rate. Ensure he has all the needed materials to check his blood glucose.   2.  Labs / imaging needed at time of follow-up: None  3.  Pending labs/ test needing follow-up: None  Hospital Course by problem list: Active Problems:   Hyperglycemia   1. HHS. [name] is a 26 y.o. Male with a PMHx significant for type 2 DM who presented with signs and symptoms of HHS in the setting of medication noncompliance. This is his 3rd admission in 2 months for the same issue. We discussed the importance of medication compliance and switched his insulin to Novolin 70/30 50 units BID in order to assist in the financial restraints. His Metformin was also increased during this hospitalization. Unfortunately optimal insulin regimen was not achieved during this hospitalization and he left the hospital due to work obligations. He will follow-up with the internal medicine clinic on 11/29. He was provided with insulin samples.    Discharge Vitals:   BP (!) 140/99 (BP Location: Left Arm)   Pulse 90   Temp 98.3 F (36.8 C) (Oral)   Resp 20   Ht 5\' 7"  (1.702 m)   Wt 200 lb (90.7  kg)   SpO2 100%   BMI 31.32 kg/m   Signed: Levora DredgeHelberg, Faiz Weber, MD 09/25/2017, 7:00 PM   My Pager: [phone]

## 2017-09-26 LAB — CULTURE, GROUP A STREP (THRC)

## 2017-09-27 ENCOUNTER — Ambulatory Visit: Payer: Self-pay

## 2017-09-27 ENCOUNTER — Ambulatory Visit: Payer: Self-pay | Admitting: Pharmacist

## 2017-09-27 ENCOUNTER — Encounter: Payer: Self-pay | Admitting: Internal Medicine

## 2017-10-17 ENCOUNTER — Ambulatory Visit (INDEPENDENT_AMBULATORY_CARE_PROVIDER_SITE_OTHER): Payer: Self-pay | Admitting: Internal Medicine

## 2017-10-17 ENCOUNTER — Other Ambulatory Visit: Payer: Self-pay

## 2017-10-17 ENCOUNTER — Inpatient Hospital Stay (HOSPITAL_COMMUNITY)
Admission: AD | Admit: 2017-10-17 | Discharge: 2017-10-19 | DRG: 638 | Disposition: A | Discharge status: Home / Self Care | Payer: Self-pay | Source: Ambulatory Visit | Attending: Internal Medicine | Admitting: Internal Medicine

## 2017-10-17 ENCOUNTER — Encounter (HOSPITAL_COMMUNITY): Payer: Self-pay | Admitting: General Practice

## 2017-10-17 VITALS — BP 152/92 | HR 107 | Temp 98.5°F | Ht 66.0 in | Wt 285.4 lb

## 2017-10-17 DIAGNOSIS — Z9114 Patient's other noncompliance with medication regimen: Secondary | ICD-10-CM

## 2017-10-17 DIAGNOSIS — Z91012 Allergy to eggs: Secondary | ICD-10-CM

## 2017-10-17 DIAGNOSIS — E1165 Type 2 diabetes mellitus with hyperglycemia: Secondary | ICD-10-CM

## 2017-10-17 DIAGNOSIS — Z794 Long term (current) use of insulin: Secondary | ICD-10-CM

## 2017-10-17 DIAGNOSIS — E871 Hypo-osmolality and hyponatremia: Secondary | ICD-10-CM | POA: Diagnosis present

## 2017-10-17 DIAGNOSIS — Z720 Tobacco use: Secondary | ICD-10-CM

## 2017-10-17 DIAGNOSIS — Z885 Allergy status to narcotic agent status: Secondary | ICD-10-CM

## 2017-10-17 DIAGNOSIS — T383X6A Underdosing of insulin and oral hypoglycemic [antidiabetic] drugs, initial encounter: Secondary | ICD-10-CM

## 2017-10-17 DIAGNOSIS — F1290 Cannabis use, unspecified, uncomplicated: Secondary | ICD-10-CM | POA: Diagnosis present

## 2017-10-17 DIAGNOSIS — N179 Acute kidney failure, unspecified: Secondary | ICD-10-CM | POA: Diagnosis present

## 2017-10-17 DIAGNOSIS — E1110 Type 2 diabetes mellitus with ketoacidosis without coma: Principal | ICD-10-CM | POA: Diagnosis present

## 2017-10-17 DIAGNOSIS — E872 Acidosis: Secondary | ICD-10-CM

## 2017-10-17 DIAGNOSIS — Z79899 Other long term (current) drug therapy: Secondary | ICD-10-CM

## 2017-10-17 DIAGNOSIS — B370 Candidal stomatitis: Secondary | ICD-10-CM | POA: Diagnosis present

## 2017-10-17 DIAGNOSIS — J45909 Unspecified asthma, uncomplicated: Secondary | ICD-10-CM | POA: Diagnosis present

## 2017-10-17 DIAGNOSIS — Z91120 Patient's intentional underdosing of medication regimen due to financial hardship: Secondary | ICD-10-CM

## 2017-10-17 DIAGNOSIS — Z886 Allergy status to analgesic agent status: Secondary | ICD-10-CM

## 2017-10-17 DIAGNOSIS — F17210 Nicotine dependence, cigarettes, uncomplicated: Secondary | ICD-10-CM | POA: Diagnosis present

## 2017-10-17 DIAGNOSIS — Z91011 Allergy to milk products: Secondary | ICD-10-CM

## 2017-10-17 DIAGNOSIS — E669 Obesity, unspecified: Secondary | ICD-10-CM | POA: Diagnosis present

## 2017-10-17 DIAGNOSIS — Z6841 Body Mass Index (BMI) 40.0 and over, adult: Secondary | ICD-10-CM

## 2017-10-17 DIAGNOSIS — I10 Essential (primary) hypertension: Secondary | ICD-10-CM | POA: Diagnosis present

## 2017-10-17 DIAGNOSIS — E861 Hypovolemia: Secondary | ICD-10-CM | POA: Diagnosis present

## 2017-10-17 HISTORY — DX: Type 2 diabetes mellitus without complications: E11.9

## 2017-10-17 LAB — BASIC METABOLIC PANEL
ANION GAP: 19 — AB (ref 5–15)
BUN: 10 mg/dL (ref 6–20)
CHLORIDE: 93 mmol/L — AB (ref 101–111)
CO2: 12 mmol/L — AB (ref 22–32)
Calcium: 9 mg/dL (ref 8.9–10.3)
Creatinine, Ser: 1.34 mg/dL — ABNORMAL HIGH (ref 0.61–1.24)
GFR calc Af Amer: >60 mL/min (ref >60)
GFR calc non Af Amer: >60 mL/min (ref >60)
GLUCOSE: 615 mg/dL — AB (ref 65–99)
POTASSIUM: 4.7 mmol/L (ref 3.5–5.1)
Sodium: 124 mmol/L — ABNORMAL LOW (ref 135–145)

## 2017-10-17 LAB — GLUCOSE, CAPILLARY
GLUCOSE-CAPILLARY: 496 mg/dL — AB (ref 65–99)
GLUCOSE-CAPILLARY: 446 mg/dL — AB (ref 65–99)
Glucose-Capillary: >600 mg/dL (ref 65–99)
Glucose-Capillary: 351 mg/dL — ABNORMAL HIGH (ref 65–99)
Glucose-Capillary: 358 mg/dL — ABNORMAL HIGH (ref 65–99)
Glucose-Capillary: 253 mg/dL — ABNORMAL HIGH (ref 65–99)
Glucose-Capillary: 259 mg/dL — ABNORMAL HIGH (ref 65–99)

## 2017-10-17 LAB — POCT GLYCOSYLATED HEMOGLOBIN (HGB A1C): Hemoglobin A1C: >14

## 2017-10-17 MED ORDER — ACETAMINOPHEN 325 MG PO TABS
650.0000 mg | ORAL_TABLET | Freq: Four times a day (QID) | ORAL | Status: DC | PRN
  Administered 2017-10-17: 650 mg via ORAL
  Filled 2017-10-17: qty 2

## 2017-10-17 MED ORDER — SODIUM CHLORIDE 0.9 % IV SOLN
INTRAVENOUS | Status: DC
Start: 2017-10-17 — End: 2017-10-18
  Administered 2017-10-17: 20:00:00 via INTRAVENOUS

## 2017-10-17 MED ORDER — SODIUM CHLORIDE 0.9 % IV BOLUS (SEPSIS)
1000.0000 mL | Freq: Once | INTRAVENOUS | Status: AC
  Administered 2017-10-17: 1000 mL via INTRAVENOUS

## 2017-10-17 MED ORDER — SODIUM CHLORIDE 0.9 % IV SOLN
INTRAVENOUS | Status: AC
  Administered 2017-10-17: 19:00:00 via INTRAVENOUS

## 2017-10-17 MED ORDER — POTASSIUM CHLORIDE 10 MEQ/100ML IV SOLN
10.0000 meq | INTRAVENOUS | Status: AC
  Administered 2017-10-17 (×2): 10 meq via INTRAVENOUS
  Filled 2017-10-17 (×2): qty 100

## 2017-10-17 MED ORDER — DEXTROSE-NACL 5-0.45 % IV SOLN
INTRAVENOUS | Status: DC
  Administered 2017-10-18: 03:00:00 via INTRAVENOUS

## 2017-10-17 MED ORDER — HEPARIN SODIUM (PORCINE) 5000 UNIT/ML IJ SOLN
5000.0000 [IU] | Freq: Three times a day (TID) | INTRAMUSCULAR | Status: DC
Start: 2017-10-17 — End: 2017-10-19
  Administered 2017-10-17 – 2017-10-19 (×5): 5000 [IU] via SUBCUTANEOUS
  Filled 2017-10-17 (×5): qty 1

## 2017-10-17 MED ORDER — SODIUM CHLORIDE 0.9 % IV SOLN: INTRAVENOUS | Status: DC

## 2017-10-17 MED ORDER — INSULIN REGULAR HUMAN 100 UNIT/ML IJ SOLN
INTRAMUSCULAR | Status: DC
  Administered 2017-10-17: 3.9 [IU]/h via INTRAVENOUS
  Administered 2017-10-18: 16 [IU]/h via INTRAVENOUS
  Filled 2017-10-17 (×2): qty 1

## 2017-10-17 MED ORDER — BENZOCAINE 10 % MT GEL
Freq: Four times a day (QID) | OROMUCOSAL | Status: DC | PRN
  Filled 2017-10-17: qty 9

## 2017-10-17 MED ORDER — NYSTATIN 100000 UNIT/ML MT SUSP
5.0000 mL | Freq: Four times a day (QID) | OROMUCOSAL | Status: DC
  Administered 2017-10-17 – 2017-10-19 (×4): 500000 [IU] via ORAL
  Filled 2017-10-17 (×9): qty 5

## 2017-10-17 NOTE — Assessment & Plan Note (Signed)
Patient notes he's been out of his 70/30 insulin for 1 week and since that time complains of significant fatigue, increased thirst, polydipsia and polyuria. Blood sugars at home have been greater than 500. He has decreased appetite however denied any nausea, vomiting or abdominal pain. He denies any infectious symptoms like cough, dysuria or diarrhea. I suspect the patient is either in DKA or HHS. -STAT metabolic panel shows pseudo-hyponatremia, glucose of 615, bicarbonate 12, anion gap of 19 and worsening renal function with a creatinine of 1.3. -Start patient on aggressive fluid replacement with normal saline bolus and then continue his fluids -Start insulin drip per DKA protocol -Admit patient to internal medicine teaching service under observation for management of DKA

## 2017-10-17 NOTE — Progress Notes (Signed)
Attempted x1 to start IV- unsuccessful; also Dorie RankSharon Powers RN assess pt; poor venous access. IV team called.

## 2017-10-17 NOTE — Progress Notes (Signed)
   CC: follow-up of diabetes, fatigue and increased thirst  HPI:  [name] is a 26 y.o. male with type 2 diabetes recently diagnosed in September of this year here with a one-week history of hyperglycemia, fatigue, increased thirst, frequent urination and weakness. He tells me he ran out of his 7030 insulin 1 week ago and since that time his blood sugars have been in the 500s at home. He does endorse decreased appetite however presently denies any nausea, vomiting or abdominal pain.  Past Medical History:  Diagnosis Date  . Asthma   . DKA (diabetic ketoacidoses) (HCC) 06/2017  . Hyperglycemia 09/24/2017  . Obesity    Review of Systems:   General: +fatigue. Denies fevers, chills HEENT: Denies changes in vision, sore throat Cardiac: Denies CP, SOB Pulmonary: Denies cough, wheezes Abd: Denies diarrhea, constipation, abdominal pain Extremities: +weakness. No edema.   Physical Exam: General: Alert, in no acute distress. Does look fatigued. HEENT: No icterus, injection or ptosis. No hoarseness or dysarthria . Dry mucous membranes Cardiac: Tachycardic but otherwise regular, no MGR appreciated Pulmonary: CTA BL with normal WOB on RA. Able to speak in complete sentences Abd: Soft with good bowel sounds Extremities: Warm, perfused. No significant pedal edema.   Vitals:   10/17/17 1408  BP: (!) 152/92  Pulse: (!) 107  Temp: 98.5 F (36.9 C)  TempSrc: Oral  SpO2: 97%  Weight: 285 lb 6.4 oz (129.5 kg)  Height: 5\' 6"  (1.676 m)   Assessment & Plan:   See Encounters Tab for problem based charting.  Patient discussed with Dr. Oswaldo DoneVincent

## 2017-10-17 NOTE — Progress Notes (Signed)
Report given to RN 5N bed #25

## 2017-10-17 NOTE — H&P (Signed)
Date: 10/17/2017               Patient Name:  [name] MRN: [id number]  DOB: [birth year] Age / Sex: 26 y.o., male   PCP: [name], [name], MD         Medical Service: Internal Medicine Teaching Service         Attending Physician: Dr. Oswaldo DoneVincent, Marquita Palmsuncan Thomas, *    First Contact: Dr. Crista ElliotHarbrecht Pager: [phone]  Second Contact: Dr. Mikey BussingHoffman Pager: [phone]       After Hours (After 5p/  First Contact Pager: [phone]  weekends / holidays): Second Contact Pager: [phone]   Chief Complaint: fatigue  History of Present Illness:  [name] is a 26yo male with DM2 and HTN who presents as a direct admission from clinic with fatigue, polydipsia, and polyuria. He was hospitalized 11/25-11/27 for HHS. He was discharged on Novolin 70/30 50u BID, metformin 50mg  BID, and lisinopril 40mg  qday. He states that he has been out of his metformin and lisinopril for a month and ran out of his Novolin 70/30 last night. He took 20u of his old insulin, Novolog yesterday evening. He woke up this morning feeling weak, nauseated, and dehydrated. He noted that his blood sugars have been elevated for the last week. He endorses polyuria and polydipsia for the last 3-4 d, as well as SOB, palpitations, and a dry cough. He denies congestion, chest pain, abdominal pain, emesis, or changes in his bowel movement. Denies sick contacts or recent travel.  He smokes ~1/2-1 cigarette per day, drinks alcohol ~1x/month. Reports he smokes marijuana, last used today, which he states did make him feel better transiently. Denies other illicit drug use.  He was noted to have glucose 615, bicarb 12, AG 19, and Cr 1.3 in the clinic. He was thus admitted to our service for DKA management. Received IVF in clinic.  Meds:  Novolin 70/30 50 units BID  Metformin 500 mg BID Lisinopril 40 mg QD  Allergies: Allergies as of 10/17/2017 - Review Complete 10/17/2017  Allergen Reaction Noted  . Vicodin [hydrocodone-acetaminophen]  Anaphylaxis 01/22/2014  . Eggs or egg-derived products Nausea And Vomiting 07/25/2017  . Lactose intolerance (gi) Rash 01/22/2014   Past Medical History:  Diagnosis Date  . Asthma   . DKA (diabetic ketoacidoses) (HCC) 06/2017  . Hyperglycemia 09/24/2017  . Obesity    Family History:  No family history on file.  Social History:  - smokes ~1/2-1 cigarette per day - drinks alcohol ~1x/month - smokes marijuana, last used today. Denies other illicit drug use. - lives at home with his brother - has financial restraints and difficulty affording his medications  Review of Systems: A complete ROS was negative except as per HPI.  Physical Exam: - BP 152/92, HR 107, temp 98.5, O2 97% on RA GEN: Obese male sitting in bed. Alert and oriented. No acute distress. Appears fatigued. HENT: [place]/AT. Moist mucous membranes. Oral thrush present on tongue. EYES: PERRL. Sclera non-icteric. Conjunctiva clear. RESP: Clear to auscultation bilaterally. No wheezes, rales, or rhonchi. No increased work of breathing. CV: Tachycardic and regular rhythm. No murmurs, gallops, or rubs. No LE edema. ABD: Soft. Non-tender. Non-distended. Normoactive bowel sounds. EXT: No edema. Warm and well perfused. NEURO: Cranial nerves II-XII grossly intact. Able to lift all four extremities against gravity. No apparent audiovisual hallucinations. Speech fluent and appropriate. PSYCH: Patient is calm and pleasant. Appropriate affect. Well-groomed; speech is appropriate and on-subject.  Labs CMP Latest Ref Rng & Units 10/17/2017  09/25/2017 09/24/2017  Glucose 65 - 99 mg/dL 161(WR615(HH) 604(V318(H) 409(W336(H)  BUN 6 - 20 mg/dL 10 <1(X<5(L) <9(J<5(L)  Creatinine 0.61 - 1.24 mg/dL 4.78(G1.34(H) 9.560.61 2.130.71  Sodium 135 - 145 mmol/L 124(L) 136 135  Potassium 3.5 - 5.1 mmol/L 4.7 3.6 3.3(L)  Chloride 101 - 111 mmol/L 93(L) 107 102  CO2 22 - 32 mmol/L 12(L) 23 24  Calcium 8.9 - 10.3 mg/dL 9.0 0.8(M8.0(L) 8.1(L)  Total Protein 6.5 - 8.1 g/dL - - -  Total  Bilirubin 0.3 - 1.2 mg/dL - - -  Alkaline Phos 38 - 126 U/L - - -  AST 15 - 41 U/L - - -  ALT 17 - 63 U/L - - -   HbA1c >14.0  Assessment & Plan by Problem: Active Problems:   DKA (diabetic ketoacidoses) [facility](HCC)  [name] is a 26yo male with DM2 and HTN who presents as a direct admission from clinic with fatigue, polydipsia, and polyuria. Found to have BG 615 with AG 19 and bicarb 12, likely DKA secondary to medication non-adherence.  DKA Likely secondary to medication non-adherence, as patient reports that he ran out of his Novolin 70/30 last night. Per clinic note, he reported that he has been out of his insulin for 1 week. Reports polyuria, polydipsia, fatigue, nausea, and weakness, consistent with DKA. No sick contacts or recent travel. No infectious s/s. Vital signs stable. Na of 124, likely pseudo-hyponatremia secondary to hyperglycemia (615). K 4.7, bicarb 12, AG 19. CBG 496 - Admit to med-surg - IV NS 17700mL/hr - Insulin drip - IV K 10mEq x2 - NPO - BMET q4h - CBG q1h - When CBG < 250, start D5 1/2NS at 11800mL/hr - Once AG closed x2, can start SQ insulin (Novolin 70/30 at 30u BID) and PO intake. Continue insulin drip for at least 1hr - Once tolerating PO intake, can discontinue insulin drip - CBC in AM  AKI Likely pre-renal secondary to hypovolemia in setting of hyperglycemia. Cr 1.34, baseline is 0.5-0.8. S/p IVF bolus, will continue maintenance fluids per DKA protocol - DKA protocol as above - BMET q4h - IV NS 15100mL/hr  Oral thrush Present on tongue on admission. - Nystatin suspension QID  Hx of HTN Home regimen includes lisinopril 40mg  daily. Holding secondary to AKI. BP 144/93 on admission. - Continue to monitor  Diet: NPO VTE PPx: SQH Code Status: Full Code Dispo: Admit patient to Inpatient with expected length of stay greater than 2 midnights.  Signed: Scherrie GerlachHuang, Kearsten Ginther, MD 10/17/2017, 5:57 PM  Pager: Demetrius CharityP [phone]

## 2017-10-17 NOTE — Progress Notes (Signed)
Spoke with Internal Medicine resident Dr. Renaldo ReelHuang about patient not being on a stepdown unit like the order had specified. Dr. Renaldo ReelHuang changed order to med surg/tele.

## 2017-10-18 ENCOUNTER — Encounter: Payer: Self-pay | Admitting: Pharmacist

## 2017-10-18 DIAGNOSIS — Z794 Long term (current) use of insulin: Secondary | ICD-10-CM

## 2017-10-18 DIAGNOSIS — E1110 Type 2 diabetes mellitus with ketoacidosis without coma: Principal | ICD-10-CM

## 2017-10-18 DIAGNOSIS — I10 Essential (primary) hypertension: Secondary | ICD-10-CM

## 2017-10-18 LAB — CBC
HCT: 41.6 % (ref 39.0–52.0)
HEMOGLOBIN: 14.4 g/dL (ref 13.0–17.0)
MCH: 29.1 pg (ref 26.0–34.0)
MCHC: 34.6 g/dL (ref 30.0–36.0)
MCV: 84.2 fL (ref 78.0–100.0)
Platelets: 292 10*3/uL (ref 150–400)
RBC: 4.94 MIL/uL (ref 4.22–5.81)
RDW: 14.3 % (ref 11.5–15.5)
WBC: 8.8 10*3/uL (ref 4.0–10.5)

## 2017-10-18 LAB — GLUCOSE, CAPILLARY
GLUCOSE-CAPILLARY: 155 mg/dL — AB (ref 65–99)
GLUCOSE-CAPILLARY: 158 mg/dL — AB (ref 65–99)
GLUCOSE-CAPILLARY: 181 mg/dL — AB (ref 65–99)
GLUCOSE-CAPILLARY: 191 mg/dL — AB (ref 65–99)
GLUCOSE-CAPILLARY: 210 mg/dL — AB (ref 65–99)
GLUCOSE-CAPILLARY: 216 mg/dL — AB (ref 65–99)
GLUCOSE-CAPILLARY: 247 mg/dL — AB (ref 65–99)
GLUCOSE-CAPILLARY: 260 mg/dL — AB (ref 65–99)
GLUCOSE-CAPILLARY: 327 mg/dL — AB (ref 65–99)
GLUCOSE-CAPILLARY: 327 mg/dL — AB (ref 65–99)
GLUCOSE-CAPILLARY: 328 mg/dL — AB (ref 65–99)
GLUCOSE-CAPILLARY: 336 mg/dL — AB (ref 65–99)
Glucose-Capillary: 130 mg/dL — ABNORMAL HIGH (ref 65–99)
Glucose-Capillary: 157 mg/dL — ABNORMAL HIGH (ref 65–99)
Glucose-Capillary: 179 mg/dL — ABNORMAL HIGH (ref 65–99)
Glucose-Capillary: 173 mg/dL — ABNORMAL HIGH (ref 65–99)

## 2017-10-18 LAB — BASIC METABOLIC PANEL
ANION GAP: 17 — AB (ref 5–15)
ANION GAP: 13 (ref 5–15)
ANION GAP: 10 (ref 5–15)
BUN: 9 mg/dL (ref 6–20)
BUN: 6 mg/dL (ref 6–20)
BUN: 5 mg/dL — AB (ref 6–20)
CHLORIDE: 104 mmol/L (ref 101–111)
CHLORIDE: 109 mmol/L (ref 101–111)
CHLORIDE: 106 mmol/L (ref 101–111)
CO2: 10 mmol/L — ABNORMAL LOW (ref 22–32)
CO2: 13 mmol/L — ABNORMAL LOW (ref 22–32)
CO2: 19 mmol/L — ABNORMAL LOW (ref 22–32)
Calcium: 8.4 mg/dL — ABNORMAL LOW (ref 8.9–10.3)
Calcium: 8.6 mg/dL — ABNORMAL LOW (ref 8.9–10.3)
Calcium: 8.5 mg/dL — ABNORMAL LOW (ref 8.9–10.3)
Creatinine, Ser: 1.14 mg/dL (ref 0.61–1.24)
Creatinine, Ser: 0.88 mg/dL (ref 0.61–1.24)
Creatinine, Ser: 0.9 mg/dL (ref 0.61–1.24)
GFR calc Af Amer: >60 mL/min (ref >60)
GFR calc Af Amer: >60 mL/min (ref >60)
GFR calc non Af Amer: >60 mL/min (ref >60)
GFR calc non Af Amer: >60 mL/min (ref >60)
Glucose, Bld: 291 mg/dL — ABNORMAL HIGH (ref 65–99)
Glucose, Bld: 152 mg/dL — ABNORMAL HIGH (ref 65–99)
Glucose, Bld: 182 mg/dL — ABNORMAL HIGH (ref 65–99)
POTASSIUM: 4 mmol/L (ref 3.5–5.1)
POTASSIUM: 3.5 mmol/L (ref 3.5–5.1)
POTASSIUM: 3.9 mmol/L (ref 3.5–5.1)
SODIUM: 131 mmol/L — AB (ref 135–145)
SODIUM: 135 mmol/L (ref 135–145)
SODIUM: 135 mmol/L (ref 135–145)

## 2017-10-18 MED ORDER — INSULIN NPH (HUMAN) (ISOPHANE) 100 UNIT/ML ~~LOC~~ SUSP: 30.0000 [IU] | Freq: Once | SUBCUTANEOUS | Status: DC

## 2017-10-18 MED ORDER — SODIUM CHLORIDE 0.9 % IV SOLN
INTRAVENOUS | Status: AC
  Administered 2017-10-18 – 2017-10-19 (×2): via INTRAVENOUS

## 2017-10-18 MED ORDER — INSULIN ASPART PROT & ASPART (70-30 MIX) 100 UNIT/ML ~~LOC~~ SUSP
30.0000 [IU] | Freq: Two times a day (BID) | SUBCUTANEOUS | Status: DC
  Administered 2017-10-18 – 2017-10-19 (×2): 30 [IU] via SUBCUTANEOUS

## 2017-10-18 MED ORDER — POTASSIUM CHLORIDE 10 MEQ/100ML IV SOLN
10.0000 meq | INTRAVENOUS | Status: AC
  Administered 2017-10-18 (×4): 10 meq via INTRAVENOUS
  Filled 2017-10-18 (×4): qty 100

## 2017-10-18 MED ORDER — INSULIN ASPART 100 UNIT/ML ~~LOC~~ SOLN
0.0000 [IU] | SUBCUTANEOUS | Status: DC
  Administered 2017-10-18: 11 [IU] via SUBCUTANEOUS
  Administered 2017-10-18: 5 [IU] via SUBCUTANEOUS
  Administered 2017-10-19: 3 [IU] via SUBCUTANEOUS
  Administered 2017-10-19 (×2): 5 [IU] via SUBCUTANEOUS
  Administered 2017-10-19: 3 [IU] via SUBCUTANEOUS

## 2017-10-18 MED ORDER — INSULIN ASPART 100 UNIT/ML ~~LOC~~ SOLN: 0.0000 [IU] | SUBCUTANEOUS | Status: DC

## 2017-10-18 MED ORDER — INSULIN ASPART PROT & ASPART (70-30 MIX) 100 UNIT/ML ~~LOC~~ SUSP: 30.0000 [IU] | Freq: Two times a day (BID) | SUBCUTANEOUS | Status: DC

## 2017-10-18 MED ORDER — INSULIN ASPART PROT & ASPART (70-30 MIX) 100 UNIT/ML ~~LOC~~ SUSP
30.0000 [IU] | Freq: Once | SUBCUTANEOUS | Status: AC
  Administered 2017-10-18: 30 [IU] via SUBCUTANEOUS
  Filled 2017-10-18: qty 10

## 2017-10-18 NOTE — Progress Notes (Addendum)
Patient has been on IV insulin drip since 12/19.  Patient was transferred to [address] [place]  "due to bed shortage for ICU or progressive care" as a direct admission from the Internal Medicine Clinic.  In following patient and staff RN,  patient's last blood sugar of 324 mg/dl was due to eating for the first time. Staff RN called and spoke with internal medicine resident about patient having to remain on drip on 5N. Physician did not want to cover CHO on the drip.  Resident ordered 70/30 insulin 30 units to be given as soon as received.  Patient's blood sugars had been in the range for transitioning off the drip prior to the elevated blood sugar of 324 mg/dl.  Recommend giving the 70/30 insulin 30 units now, wait 1 hour and then stop the drip and give Novolog correction scale according to the current blood sugar. Recommend adding Novolog MODERATE correction scale TID & HS to the orders.  Will need to give the 70/30 insulin later this evening around 2100 or 2200 due to the late administration of the first dose of 70/30.    Smith MinceKendra Sullivan Blasing RN BSN CDE Diabetes Coordinator Pager: [phone]  8am-5pm

## 2017-10-18 NOTE — Progress Notes (Signed)
Provided samples of Novolog 70/30 and education patient on importance of adherence. Advised patient to contact me post-discharge for further help with medication access. Patient verbalized understanding

## 2017-10-18 NOTE — Care Management (Signed)
Patient is active with Internal medicine clinic. [facility]  So that he can pickup his insulin regularly. Patient encouraged to monitor his levels and to take insulin as prescribed.

## 2017-10-18 NOTE — Progress Notes (Signed)
   Subjective: The patient was lying in bed today upon entering the room. He stated that he felt fine. He denied nausea, vomiting, or diarrhea. The patient was concerned that he would not be able to obtain additional insulin when discharged as he has had trouble with this again today. He denied concerning symptoms and stated that he was hungry desiring to eat today. He denied polyuria, polydipsia, and stated that the malaise has improved.   Objective:  Vital signs in last 24 hours: Vitals:   10/17/17 2201 10/18/17 0049 10/18/17 0559 10/18/17 0845  BP: 126/68 115/65 133/68   Pulse: 81 80 84   Resp: 16 16 16    Temp: 98.1 F (36.7 C) 97.8 F (36.6 C) 98.1 F (36.7 C)   TempSrc: Oral Oral Oral   SpO2: 98% 99% 97%   Weight:    285 lb 6.4 oz (129.5 kg)  Height:    5\' 6"  (1.676 m)   ROS negative except as per HPI.  Physical Exam  Constitutional: He appears well-developed and well-nourished. No distress.  Cardiovascular: Normal rate and regular rhythm.  No murmur heard. Pulmonary/Chest: Effort normal and breath sounds normal. No respiratory distress.  Abdominal: Soft. Bowel sounds are normal. He exhibits no distension.  Vitals reviewed.   Assessment/Plan:  Active Problems:   DKA (diabetic ketoacidoses) (HCC)  DKA: --Initial event resolved, Anion GAP closed x 2 readings, 13 & 10 respectively.  --Insulin ggt discontinued --SSI moderate initiated q4 CBG --Novolog 70/30 at 30units w/ breakfast and last meal of the day given as well --fluids continued for 20 hours at 100cc/hr --Potassium given, 3.9 this am --Cr 0.90 --WBC 8.8/14.4/41.6/292 --BMP in am, plan to discharge home tomorrow  HTN: Continue to monitor, has been relatively normotensive on this admission   AKI: --DKA protocol followed --Patient's Cr to normal limits --Resolved  Diet: Carb modified Code: Full Fluids: NS @ 100cc/hr DVT PPX: enoxaparin  Dispo: Anticipated discharge in approximately 1 day(s).    Lanelle BalHarbrecht, Herschel Fleagle, MD 10/18/2017, 12:50 PM Pager: Pager# [phone]

## 2017-10-18 NOTE — Progress Notes (Signed)
Nutrition Brief Note  Patient identified on the Malnutrition Screening Tool (MST) Report.  Wt Readings from Last 15 Encounters:  10/18/17 285 lb 6.4 oz (129.5 kg)  10/17/17 285 lb 6.4 oz (129.5 kg)  09/23/17 200 lb (90.7 kg)  08/23/17 (!) 309 lb 11.9 oz (140.5 kg)  08/23/17 (!) 315 lb (142.9 kg)  08/23/17 (!) 316 lb 8 oz (143.6 kg)  08/01/17 (!) 331 lb 14.4 oz (150.5 kg)  07/24/17 (!) 319 lb 7.1 oz (144.9 kg)  07/20/17 (!) 305 lb (138.3 kg)  07/21/15 (!) 316 lb (143.3 kg)  01/18/15 300 lb (136.1 kg)  01/22/14 298 lb (135.2 kg)   Body mass index is 46.06 kg/m. Patient meets criteria for Obesity Class III based on current BMI.   Current diet order is Carbohydrate Modified. CBG's [id number]. Labs and medications reviewed.   If nutrition issues arise, please consult RD.   Maureen ChattersKatie Alicya Bena, RD, LDN Pager #: [phone] After-Hours Pager #: [phone]

## 2017-10-18 NOTE — Progress Notes (Signed)
Call received from GilbertWinfrey, MD. Frances FurbishWinfrey stated that he had informed patients MD that unit was attempting to reach him.  Winfrey asked if there was an urgent need.  This RN explained to Winfrey that we needed orders as soon as possible to transfer patient to a higher level of care.  Winfrey stated that he would le the MD know.

## 2017-10-18 NOTE — Progress Notes (Signed)
Spoke with patient about his diabetes.  Was originally diagnosed in September, 2018. Was last discharged from the hospital in November, 2018 with the Internal Medicine Clinic. Patient states that he has not been taking his insulin due to the fact that the [facility] did not have his prescription when he was discharged last month. Patient's hgbA1C is >14. Spoke with case manager on 5N in regards to patient getting insulin at discharge. Patient is seen in the Internal Medicine clinic.   Talked with patient about the importance of taking the insulin. Want to be sure that he can get it financially before discharge. Will continue to monitor blood sugars while in the hospital.  Smith MinceKendra Shaleka Brines RN BSN CDE Diabetes Coordinator Pager: [phone]  8am-5pm

## 2017-10-18 NOTE — Progress Notes (Signed)
Paged Frances FurbishWinfrey, MD to receive order for patient to be moved to SDU/progressive care.  Awaiting call back.

## 2017-10-19 LAB — BASIC METABOLIC PANEL
Anion gap: 7 (ref 5–15)
BUN: 7 mg/dL (ref 6–20)
CHLORIDE: 108 mmol/L (ref 101–111)
CO2: 20 mmol/L — AB (ref 22–32)
CREATININE: 0.76 mg/dL (ref 0.61–1.24)
Calcium: 8.6 mg/dL — ABNORMAL LOW (ref 8.9–10.3)
GFR calc Af Amer: >60 mL/min (ref >60)
GFR calc non Af Amer: >60 mL/min (ref >60)
Glucose, Bld: 124 mg/dL — ABNORMAL HIGH (ref 65–99)
Potassium: 3.2 mmol/L — ABNORMAL LOW (ref 3.5–5.1)
SODIUM: 135 mmol/L (ref 135–145)

## 2017-10-19 LAB — GLUCOSE, CAPILLARY
GLUCOSE-CAPILLARY: 160 mg/dL — AB (ref 65–99)
GLUCOSE-CAPILLARY: 210 mg/dL — AB (ref 65–99)
GLUCOSE-CAPILLARY: 231 mg/dL — AB (ref 65–99)

## 2017-10-19 MED ORDER — LISINOPRIL 40 MG PO TABS: 40.0000 mg | ORAL_TABLET | Freq: Every day | ORAL | 0 refills | Status: DC

## 2017-10-19 MED ORDER — METFORMIN HCL 500 MG PO TABS: 500.0000 mg | ORAL_TABLET | Freq: Two times a day (BID) | ORAL | 0 refills | Status: DC

## 2017-10-19 MED ORDER — INSULIN ASPART PROT & ASPART (70-30 MIX) 100 UNIT/ML ~~LOC~~ SUSP: 50.0000 [IU] | Freq: Two times a day (BID) | SUBCUTANEOUS | 11 refills | Status: DC

## 2017-10-19 MED ORDER — POTASSIUM CHLORIDE CRYS ER 20 MEQ PO TBCR
40.0000 meq | EXTENDED_RELEASE_TABLET | Freq: Once | ORAL | Status: AC
  Administered 2017-10-19: 40 meq via ORAL
  Filled 2017-10-19: qty 2

## 2017-10-19 NOTE — Progress Notes (Signed)
   Subjective: The patient was asleep in the bed today went into the room.  He denied acute complaints overnight stated he felt much better was ready to be sent home today.  The patient's malaise has improved, his weakness has greatly improved, and he has spoken with our clinical pharmacist provide him with an additional sample of this insulin 70/30.  He denied pain, abdominal pain, muscle aches, nausea, vomiting, weakness, polydipsia, polyuria, or hematuria.  Objective:  Vital signs in last 24 hours: Vitals:   10/18/17 0559 10/18/17 0845 10/18/17 2126 10/19/17 0436  BP: 133/68  131/72 (!) 120/56  Pulse: 84  82 78  Resp: 16     Temp: 98.1 F (36.7 C)  98.6 F (37 C) 98.3 F (36.8 C)  TempSrc: Oral  Oral Oral  SpO2: 97%  100% 99%  Weight:  285 lb 6.4 oz (129.5 kg)    Height:  5\' 6"  (1.676 m)     ROS negative except as per HPI.  Physical Exam Constitutional: Patient is well-developed, well-nourished, no acute distress Cardiovascular: The rate is normal, the rhythm appears to be normal via auscultation, no murmurs auscultated Pulmonary normal effort normal breath sounds Abdominal: Soft, nontender, nondistended  Assessment/Plan:  Principal Problem:   DKA (diabetic ketoacidoses) (HCC)  DKA: --Initial event resolved, Anion GAP closed x 2 readings, 13 & 10 respectively.  --Insulin ggt discontinued the prior day, patient slightly weak yesterday --SSI moderate initiated q4 CBG, last several readings appropriate  --Novolog 70/30 at 30units w/ breakfast and last meal of the day given as well --fluids continued for 20 hours at 100cc/hr --Potassium given, 3.2 this am, will give PO 40mEq this AM --Plan to discharge home today  HTN: Continue to monitor, has been relatively normotensive on this admission   AKI: --DKA protocol followed and discontinued now that he is stable --Patient's Cr to normal limits at 0.76 --Resolved  Diet: Carb modified Code: Full Fluids: NS @  100cc/hr DVT PPX: enoxaparin  Dispo: Anticipated discharge in approximately today(s).   Lanelle BalHarbrecht, Domanique Luckett, MD 10/19/2017, 6:58 AM Pager: Pager# [phone]

## 2017-10-19 NOTE — Discharge Summary (Signed)
Name: Roger HartshornGregory Farley MRN: [id number] DOB: 06/10/1991 26 y.o. PCP: [name], [name], MD  Date of Admission: 10/17/2017  5:38 PM Date of Discharge:  Attending Physician: Inez CatalinaMullen, Emily B, MD  Discharge Diagnosis: Principal Problem:   DKA (diabetic ketoacidoses) [facility](HCC)   Discharge Medications: Allergies as of 10/19/2017      Reactions   Vicodin [hydrocodone-acetaminophen] Anaphylaxis   Tolerates tylenol with no allergy.   Eggs Or Egg-derived Products Nausea And Vomiting   Lactose Intolerance (gi) Rash      Medication List    STOP taking these medications   insulin aspart 100 UNIT/ML injection Commonly known as:  novoLOG   insulin glargine 100 UNIT/ML injection Commonly known as:  LANTUS     TAKE these medications   glucose blood test strip Use as instructed   insulin aspart protamine- aspart (70-30) 100 UNIT/ML injection Commonly known as:  NOVOLOG MIX 70/30 Inject 0.5 mLs (50 Units total) into the skin 2 (two) times daily with a meal.   lisinopril 40 MG tablet Commonly known as:  PRINIVIL,ZESTRIL Take 1 tablet (40 mg total) by mouth daily.   metFORMIN 500 MG tablet Commonly known as:  GLUCOPHAGE Take 1 tablet (500 mg total) by mouth 2 (two) times daily with a meal.       Disposition and follow-up:   [name] St[facility].  At the hospital follow up visit please address:  1.  Please again reinforce to the patient that he will need to continue to take the insulin two times daily to prevent reoccurrence of the DKA. He has been semi-compliant on his insulin prompting a change to the 70/30 mix at this junction. He has been sent home on 50 units with breakfast and again with his evening meal.  2.  Labs / imaging needed at time of follow-up: CBG, last A1c is >14  3.  Pending labs/ test needing follow-up: n/a   Follow-up Appointments:   Hospital Course by problem list: Principal Problem:  DKA (diabetic ketoacidoses) (HCC)   1. DKA Patient was admitted from the clinic with a blood glucose of 615 and A-GAP of 19 with bicarb 12. He stated that he ran out of his insulin and could not get a refill. He was placed on an insulin drip with DKA protocol and rapidly improved over the following day. The drip was discontinued with the closing of his GAP times two and decrease in CBG readings. He was initiated on moderate SSI and 30 units of 70/30 Novolog with 4 hours CBG checks. The patients polyuria, polydipsia and fatigue improved greatly over the first and second day of admission. He was given a sample of his insulin and [facility]. The patient was extensively counseled by multiple providers and the clinical pharmacist who provided him with the insulin sample as to the importance of being compliant with his medications. He agreed to do better and to pursue insulin refills more appropriately in the future.   Discharge Vitals:   BP (!) 120/56 (BP Location: Left Arm)   Pulse 78   Temp 98.3 F (36.8 C) (Oral)   Resp 16   Ht 5\' 6"  (1.676 m)   Wt 285 lb 6.4 oz (129.5 kg)   SpO2 99%   BMI 46.06 kg/m   Pertinent Labs, Studies, and Procedures:  CBC Latest Ref Rng & Units 10/18/2017 09/23/2017 08/24/2017  WBC 4.0 - 10.5 K/uL 8.8 8.6 9.7  Hemoglobin  13.0 - 17.0 g/dL 16.114.4 09.616.5 04.514.9  Hematocrit 39.0 - 52.0 % 41.6 46.1 41.1  Platelets 150 - 400 K/uL 292 338 250   CMP Latest Ref Rng & Units 10/19/2017 10/18/2017 10/18/2017  Glucose 65 - 99 mg/dL 409(W124(H) 119(J182(H) 478(G152(H)  BUN 6 - 20 mg/dL 7 5(L) 6  Creatinine 9.560.61 - 1.24 mg/dL 2.130.76 0.860.90 5.780.88  Sodium 135 - 145 mmol/L 135 135 135  Potassium 3.5 - 5.1 mmol/L 3.2(L) 3.9 3.5  Chloride 101 - 111 mmol/L 108 106 109  CO2 22 - 32 mmol/L 20(L) 19(L) 13(L)  Calcium 8.9 - 10.3 mg/dL 4.6(N8.6(L) 6.2(X8.5(L) 5.2(W8.6(L)  Total Protein 6.5 - 8.1 g/dL - - -  Total Bilirubin 0.3 - 1.2 mg/dL - - -  Alkaline Phos 38 - 126 U/L - - -    AST 15 - 41 U/L - - -  ALT 17 - 63 U/L - - -   Discharge Instructions: Discharge Instructions    Call MD for:  persistant dizziness or light-headedness   Complete by:  As directed    Call MD for:  persistant nausea and vomiting   Complete by:  As directed    Diet - low sodium heart healthy   Complete by:  As directed    Increase activity slowly   Complete by:  As directed      Signed: Lanelle BalHarbrecht, Altamese Deguire, MD 10/19/2017, 10:56 AM   Pager: Pager# [phone]

## 2017-10-19 NOTE — Care Management Note (Signed)
Case Management Note  Patient Details  Name: Roger HartshornGregory Farley MRN: [id number] Date of Birth: [birth year]  Subjective/Objective:                 Spoke to patient at the bedside. Patient was able to define plan if he ran out of medication. He verbalized calling the PCP (internal med clinic)  to get refills, he stated that he drives and is able to get to MD office, and pharmacy. He stated that he did not have a problem paying for oral meds or 70/30 and [facility]. Noted that Novolog and Lantus were discontinued from home list on AVS. No other CM needs identified.    Action/Plan:   Expected Discharge Date:  10/19/17               Expected Discharge Plan:  Home/Self Care  In-House Referral:     Discharge planning Services  CM Consult  Post Acute Care Choice:    Choice offered to:     DME Arranged:    DME Agency:     HH Arranged:    HH Agency:     Status of Service:  Completed, signed off  If discussed at [employer] Length of Stay Meetings, dates discussed:    Additional Comments:  Lawerance SabalDebbie Alexine Pilant, RN 10/19/2017, 11:02 AM

## 2017-10-19 NOTE — Progress Notes (Signed)
Results for Bridget HartshornMCKINNIES, Clenton (MRN [id number]) as of 10/19/2017 08:32  Ref. Range 10/18/2017 17:00 10/18/2017 21:24 10/19/2017 00:10 10/19/2017 04:39  Glucose-Capillary Latest Ref Range: 65 - 99 mg/dL 782210 (H) 956247 (H) 213210 (H) 160 (H)  Recommend changing Novolog MODERATE correction scale to TID & HS since patient is eating.   Smith MinceKendra Yuma Blucher RN BSN CDE Diabetes Coordinator Pager: [phone]  8am-5pm

## 2017-10-19 NOTE — Progress Notes (Signed)
Internal Medicine Clinic Attending  I saw and evaluated the patient.  I personally confirmed the key portions of the history and exam documented by Dr. Molt and I reviewed pertinent patient test results.  The assessment, diagnosis, and plan were formulated together and I agree with the documentation in the resident's note. 

## 2017-10-19 NOTE — Discharge Instructions (Signed)
FOLLOW-UP INSTRUCTIONS When: One week at our [facility] at [facility] For: Hospital discharge follow-up What to bring: all of your medications  [facility] the [facility]. If at any time you have questions or concerns please feel free to call the clinic at [phone] to schedule an appointment or to leave a message with your provider.   If your increased urination or thirst returns as well as the weakness etc. Please call ahead and or visit the ED again. It is essential that you maintain your insulin use two times daily to prevent such episodes from reoccurring as event brings with it risk of poor outcomes. DKA can lead to severe lasting impairment or worse. Please call our clinic if you need refills or schedule for an acute care visit if no response is given in the time frame expected.

## 2017-10-19 NOTE — Addendum Note (Signed)
Addended by: Erlinda HongVINCENT, Kimanh Templeman T on: 10/19/2017 09:43 AM   Modules accepted: Level of Service

## 2017-10-19 NOTE — Progress Notes (Signed)
Roger HartshornGregory Farley to be D/C'd Home per MD order.  Discussed prescriptions and follow up appointments with the patient. Prescriptions given to patient, medication list explained in detail. Pt verbalized understanding.  Allergies as of 10/19/2017      Reactions   Vicodin [hydrocodone-acetaminophen] Anaphylaxis   Tolerates tylenol with no allergy.   Eggs Or Egg-derived Products Nausea And Vomiting   Lactose Intolerance (gi) Rash      Medication List    STOP taking these medications   insulin aspart 100 UNIT/ML injection Commonly known as:  novoLOG   insulin glargine 100 UNIT/ML injection Commonly known as:  LANTUS     TAKE these medications   glucose blood test strip Use as instructed   insulin aspart protamine- aspart (70-30) 100 UNIT/ML injection Commonly known as:  NOVOLOG MIX 70/30 Inject 0.5 mLs (50 Units total) into the skin 2 (two) times daily with a meal.   lisinopril 40 MG tablet Commonly known as:  PRINIVIL,ZESTRIL Take 1 tablet (40 mg total) by mouth daily.   metFORMIN 500 MG tablet Commonly known as:  GLUCOPHAGE Take 1 tablet (500 mg total) by mouth 2 (two) times daily with a meal.       Vitals:   10/18/17 2126 10/19/17 0436  BP: 131/72 (!) 120/56  Pulse: 82 78  Resp:    Temp: 98.6 F (37 C) 98.3 F (36.8 C)  SpO2: 100% 99%    Skin clean, dry and intact without evidence of skin break down, no evidence of skin tears noted. IV catheter discontinued intact. Site without signs and symptoms of complications. Dressing and pressure applied. Pt denies pain at this time. No complaints noted.  An After Visit Summary was printed and given to the patient. Patient escorted via WC, and D/C home via private auto.  GrenadaBrittany Derry Kassel RN

## 2017-10-25 ENCOUNTER — Ambulatory Visit: Payer: Self-pay

## 2017-11-01 ENCOUNTER — Ambulatory Visit: Payer: Self-pay

## 2017-12-01 ENCOUNTER — Encounter (HOSPITAL_COMMUNITY): Payer: Self-pay | Admitting: *Deleted

## 2017-12-01 ENCOUNTER — Other Ambulatory Visit: Payer: Self-pay

## 2017-12-01 ENCOUNTER — Observation Stay (HOSPITAL_COMMUNITY)
Admission: EM | Admit: 2017-12-01 | Discharge: 2017-12-02 | Disposition: A | Discharge status: Home / Self Care | Payer: Self-pay | Attending: Internal Medicine | Admitting: Internal Medicine

## 2017-12-01 DIAGNOSIS — T383X6A Underdosing of insulin and oral hypoglycemic [antidiabetic] drugs, initial encounter: Secondary | ICD-10-CM

## 2017-12-01 DIAGNOSIS — R0602 Shortness of breath: Secondary | ICD-10-CM | POA: Insufficient documentation

## 2017-12-01 DIAGNOSIS — E1110 Type 2 diabetes mellitus with ketoacidosis without coma: Secondary | ICD-10-CM | POA: Diagnosis present

## 2017-12-01 DIAGNOSIS — F17210 Nicotine dependence, cigarettes, uncomplicated: Secondary | ICD-10-CM | POA: Insufficient documentation

## 2017-12-01 DIAGNOSIS — Z79899 Other long term (current) drug therapy: Secondary | ICD-10-CM | POA: Insufficient documentation

## 2017-12-01 DIAGNOSIS — Z91120 Patient's intentional underdosing of medication regimen due to financial hardship: Secondary | ICD-10-CM

## 2017-12-01 DIAGNOSIS — I452 Bifascicular block: Secondary | ICD-10-CM | POA: Insufficient documentation

## 2017-12-01 DIAGNOSIS — J45909 Unspecified asthma, uncomplicated: Secondary | ICD-10-CM | POA: Insufficient documentation

## 2017-12-01 DIAGNOSIS — N179 Acute kidney failure, unspecified: Secondary | ICD-10-CM | POA: Insufficient documentation

## 2017-12-01 DIAGNOSIS — E739 Lactose intolerance, unspecified: Secondary | ICD-10-CM | POA: Insufficient documentation

## 2017-12-01 DIAGNOSIS — R079 Chest pain, unspecified: Secondary | ICD-10-CM

## 2017-12-01 DIAGNOSIS — Z6841 Body Mass Index (BMI) 40.0 and over, adult: Secondary | ICD-10-CM | POA: Insufficient documentation

## 2017-12-01 DIAGNOSIS — Z885 Allergy status to narcotic agent status: Secondary | ICD-10-CM | POA: Insufficient documentation

## 2017-12-01 DIAGNOSIS — E1165 Type 2 diabetes mellitus with hyperglycemia: Secondary | ICD-10-CM

## 2017-12-01 DIAGNOSIS — I878 Other specified disorders of veins: Secondary | ICD-10-CM

## 2017-12-01 DIAGNOSIS — Z794 Long term (current) use of insulin: Secondary | ICD-10-CM | POA: Insufficient documentation

## 2017-12-01 DIAGNOSIS — Z91012 Allergy to eggs: Secondary | ICD-10-CM | POA: Insufficient documentation

## 2017-12-01 DIAGNOSIS — E1010 Type 1 diabetes mellitus with ketoacidosis without coma: Principal | ICD-10-CM | POA: Insufficient documentation

## 2017-12-01 DIAGNOSIS — R0789 Other chest pain: Secondary | ICD-10-CM | POA: Insufficient documentation

## 2017-12-01 LAB — CBC
HCT: 52.6 % — ABNORMAL HIGH (ref 39.0–52.0)
HCT: 48 % (ref 39.0–52.0)
Hemoglobin: 18.4 g/dL — ABNORMAL HIGH (ref 13.0–17.0)
Hemoglobin: 16.5 g/dL (ref 13.0–17.0)
MCH: 30.7 pg (ref 26.0–34.0)
MCH: 30.2 pg (ref 26.0–34.0)
MCHC: 35 g/dL (ref 30.0–36.0)
MCHC: 34.4 g/dL (ref 30.0–36.0)
MCV: 87.7 fL (ref 78.0–100.0)
MCV: 87.8 fL (ref 78.0–100.0)
PLATELETS: 464 10*3/uL — AB (ref 150–400)
PLATELETS: 375 10*3/uL (ref 150–400)
RBC: 6 MIL/uL — ABNORMAL HIGH (ref 4.22–5.81)
RBC: 5.47 MIL/uL (ref 4.22–5.81)
RDW: 15.5 % (ref 11.5–15.5)
RDW: 15.4 % (ref 11.5–15.5)
WBC: 8.8 10*3/uL (ref 4.0–10.5)
WBC: 9.3 10*3/uL (ref 4.0–10.5)

## 2017-12-01 LAB — BASIC METABOLIC PANEL
Anion gap: 20 — ABNORMAL HIGH (ref 5–15)
Anion gap: 15 (ref 5–15)
BUN: 13 mg/dL (ref 6–20)
BUN: 9 mg/dL (ref 6–20)
CALCIUM: 8.8 mg/dL — AB (ref 8.9–10.3)
CO2: 8 mmol/L — ABNORMAL LOW (ref 22–32)
CO2: 11 mmol/L — AB (ref 22–32)
CREATININE: 1.32 mg/dL — AB (ref 0.61–1.24)
CREATININE: 0.96 mg/dL (ref 0.61–1.24)
Calcium: 9 mg/dL (ref 8.9–10.3)
Chloride: 111 mmol/L (ref 101–111)
Chloride: 107 mmol/L (ref 101–111)
GFR calc Af Amer: >60 mL/min (ref >60)
GFR calc non Af Amer: >60 mL/min (ref >60)
GLUCOSE: 173 mg/dL — AB (ref 65–99)
Glucose, Bld: 342 mg/dL — ABNORMAL HIGH (ref 65–99)
POTASSIUM: 4.5 mmol/L (ref 3.5–5.1)
Potassium: 4 mmol/L (ref 3.5–5.1)
SODIUM: 139 mmol/L (ref 135–145)
Sodium: 133 mmol/L — ABNORMAL LOW (ref 135–145)

## 2017-12-01 LAB — LIPASE, BLOOD: Lipase: 20 U/L (ref 11–51)

## 2017-12-01 LAB — CBG MONITORING, ED
GLUCOSE-CAPILLARY: 484 mg/dL — AB (ref 65–99)
GLUCOSE-CAPILLARY: 315 mg/dL — AB (ref 65–99)
GLUCOSE-CAPILLARY: 317 mg/dL — AB (ref 65–99)
GLUCOSE-CAPILLARY: 251 mg/dL — AB (ref 65–99)
Glucose-Capillary: 540 mg/dL (ref 65–99)
Glucose-Capillary: 319 mg/dL — ABNORMAL HIGH (ref 65–99)
Glucose-Capillary: 261 mg/dL — ABNORMAL HIGH (ref 65–99)

## 2017-12-01 LAB — URINALYSIS, ROUTINE W REFLEX MICROSCOPIC
Bacteria, UA: NONE SEEN
Bilirubin Urine: NEGATIVE
Ketones, ur: 80 mg/dL — AB
LEUKOCYTES UA: NEGATIVE
NITRITE: NEGATIVE
Protein, ur: 30 mg/dL — AB
SPECIFIC GRAVITY, URINE: 1.02 (ref 1.005–1.030)
Squamous Epithelial / LPF: NONE SEEN
WBC, UA: NONE SEEN WBC/hpf (ref 0–5)
pH: 5 (ref 5.0–8.0)

## 2017-12-01 LAB — COMPREHENSIVE METABOLIC PANEL
ALT: 14 U/L — AB (ref 17–63)
AST: 12 U/L — AB (ref 15–41)
Albumin: 4.4 g/dL (ref 3.5–5.0)
Alkaline Phosphatase: 138 U/L — ABNORMAL HIGH (ref 38–126)
BILIRUBIN TOTAL: 1.8 mg/dL — AB (ref 0.3–1.2)
BUN: 15 mg/dL (ref 6–20)
CO2: <7 mmol/L — ABNORMAL LOW (ref 22–32)
CREATININE: 1.47 mg/dL — AB (ref 0.61–1.24)
Calcium: 10.3 mg/dL (ref 8.9–10.3)
Chloride: 101 mmol/L (ref 101–111)
GFR calc Af Amer: >60 mL/min (ref >60)
Glucose, Bld: 547 mg/dL (ref 65–99)
POTASSIUM: 4.9 mmol/L (ref 3.5–5.1)
Sodium: 135 mmol/L (ref 135–145)
TOTAL PROTEIN: 8.9 g/dL — AB (ref 6.5–8.1)

## 2017-12-01 LAB — GLUCOSE, CAPILLARY
Glucose-Capillary: 183 mg/dL — ABNORMAL HIGH (ref 65–99)
Glucose-Capillary: 159 mg/dL — ABNORMAL HIGH (ref 65–99)
Glucose-Capillary: 152 mg/dL — ABNORMAL HIGH (ref 65–99)
Glucose-Capillary: 143 mg/dL — ABNORMAL HIGH (ref 65–99)

## 2017-12-01 LAB — TROPONIN I: Troponin I: <0.03 ng/mL (ref <0.03)

## 2017-12-01 MED ORDER — SODIUM CHLORIDE 0.9 % IV BOLUS (SEPSIS)
1000.0000 mL | Freq: Once | INTRAVENOUS | Status: AC
  Administered 2017-12-01: 1000 mL via INTRAVENOUS

## 2017-12-01 MED ORDER — SODIUM CHLORIDE 0.9 % IV SOLN
INTRAVENOUS | Status: AC
  Administered 2017-12-01: 5 [IU]/h via INTRAVENOUS
  Administered 2017-12-01: 4.8 [IU]/h via INTRAVENOUS
  Filled 2017-12-01 (×2): qty 1

## 2017-12-01 MED ORDER — POTASSIUM CHLORIDE 10 MEQ/100ML IV SOLN
10.0000 meq | INTRAVENOUS | Status: AC
  Administered 2017-12-01 (×2): 10 meq via INTRAVENOUS
  Filled 2017-12-01 (×2): qty 100

## 2017-12-01 MED ORDER — SODIUM CHLORIDE 0.9 % IV SOLN: INTRAVENOUS | Status: DC

## 2017-12-01 MED ORDER — KCL IN DEXTROSE-NACL 40-5-0.45 MEQ/L-%-% IV SOLN
INTRAVENOUS | Status: DC
  Administered 2017-12-01: via INTRAVENOUS
  Filled 2017-12-01: qty 1000

## 2017-12-01 MED ORDER — DEXTROSE-NACL 5-0.45 % IV SOLN: INTRAVENOUS | Status: DC

## 2017-12-01 MED ORDER — DEXTROSE-NACL 5-0.45 % IV SOLN
INTRAVENOUS | Status: DC
  Administered 2017-12-01: 17:00:00 via INTRAVENOUS

## 2017-12-01 MED ORDER — SODIUM CHLORIDE 0.9% FLUSH
3.0000 mL | Freq: Two times a day (BID) | INTRAVENOUS | Status: DC
  Administered 2017-12-01 (×2): 3 mL via INTRAVENOUS

## 2017-12-01 MED ORDER — LISINOPRIL 20 MG PO TABS: 40.0000 mg | ORAL_TABLET | Freq: Every day | ORAL | Status: DC

## 2017-12-01 MED ORDER — ENOXAPARIN SODIUM 40 MG/0.4ML ~~LOC~~ SOLN
40.0000 mg | SUBCUTANEOUS | Status: DC
  Administered 2017-12-01: 40 mg via SUBCUTANEOUS
  Filled 2017-12-01 (×2): qty 0.4

## 2017-12-01 MED ORDER — SODIUM CHLORIDE 0.9 % IV SOLN
INTRAVENOUS | Status: DC
  Administered 2017-12-01: 16:00:00 via INTRAVENOUS

## 2017-12-01 NOTE — ED Notes (Signed)
Pt given water 

## 2017-12-01 NOTE — ED Notes (Signed)
Critical cbg 547, CO2 less than 7. MD notified.

## 2017-12-01 NOTE — ED Triage Notes (Signed)
To ED for eval of vomiting for past 3 days. Appears weak. States his glucose monitor at home just reads high. Minimal abd pain.

## 2017-12-01 NOTE — H&P (Signed)
Date: 12/01/2017               Patient Name:  Roger HartshornGregory Delaluz MRN: [id number]  DOB: 10/26/1991 Age / Sex: 27 y.o., male   PCP: [name], [name], MD         Medical Service: Internal Medicine Teaching Service         Attending Physician: Dr. Doneen PoissonKlima, Lawrence, MD    First Contact: Dr. Ginette OttoAlec Melvin Pager: [phone]  Second Contact: Dr. Arnetha CourserSumayya Amin Pager: [phone]       After Hours (After 5p/  First Contact Pager: [phone]  weekends / holidays): Second Contact Pager: [phone]   Chief Complaint: chest pain, fatigue  History of Present Illness: Patient is a 27 year old male with insulin-dependent diabetes mellitus, asthma, morbid obesity.    He presents to the emergency department today after feeling unwell about 3 days ago.  He said he threw up once and felt better.  However, since then he has been feeling very fatigued.  Yesterday he spent the entire day in bed.    When he woke up this morning he felt that his heart was racing and he had chest pain and shortness of breath and that he needed to go to the emergency department.  He reports that at home his blood sugars usually run in the 300 range.  He says that despite being hospitalized once per month for the last 4 months for DKA he has not been to clinic to adjust his insulin.  He has had 4 no-shows over the last 2 months in our clinic and has been sent a letter, he has no showed about 8 times in the last 6 months.  He denies recent illness, fever, abdominal pain.  He denies abdominal pain 3 days prior when he did have the episode of vomiting.  He reports good compliance with his 70/30 insulin since discharge but admits to not having picked up the metformin prescription.  ED course: Patient arrived in DKA with a bicarb less than 7 and a blood sugar of 547 w/anion gap of 20.  He was hemoconcentrated from severe dehydration, had urine ketones on UA.  He was started on IV fluids and insulin drip per DKA protocol.    Meds:  Current Meds    Medication Sig  . glucose blood test strip Use as instructed  . insulin aspart protamine- aspart (NOVOLOG MIX 70/30) (70-30) 100 UNIT/ML injection Inject 0.5 mLs (50 Units total) into the skin 2 (two) times daily with a meal.  . metFORMIN (GLUCOPHAGE) 500 MG tablet Take 1 tablet (500 mg total) by mouth 2 (two) times daily with a meal.     Allergies: Allergies as of 12/01/2017 - Review Complete 12/01/2017  Allergen Reaction Noted  . Vicodin [hydrocodone-acetaminophen] Anaphylaxis 01/22/2014  . Eggs or egg-derived products Nausea And Vomiting 07/25/2017  . Lactose intolerance (gi) Rash 01/22/2014   Past Medical History:  Diagnosis Date  . Asthma   . DKA (diabetic ketoacidoses) (HCC) 06/2017; 10/17/2017  . Hyperglycemia 09/24/2017  . Obesity   . Type II diabetes mellitus (HCC)    "dx'd 06/2017"    Family History:  None  Social History:  Social History   Socioeconomic History  . Marital status: Single    Spouse name: Not on file  . Number of children: Not on file  . Years of education: Not on file  . Highest education level: Not on file  Social Needs  . Financial resource strain: Not on file  .  Food insecurity - worry: Not on file  . Food insecurity - inability: Not on file  . Transportation needs - medical: Not on file  . Transportation needs - non-medical: Not on file  Occupational History  . Not on file  Tobacco Use  . Smoking status: Current Every Day Smoker    Packs/day: 0.10    Years: 12.00    Pack years: 1.20    Types: Cigarettes  . Smokeless tobacco: Never Used  . Tobacco comment: cutting back - now 1-2 cigarettes per day  Substance and Sexual Activity  . Alcohol use: Yes    Comment: 10/17/2017 "2 shots of liquor/month; maybe"  . Drug use: Yes    Types: Marijuana    Comment: 10/17/2017 ~3 times per week  . Sexual activity: No  Other Topics Concern  . Not on file  Social History Narrative  . Not on file    Review of Systems: A complete ROS was  negative except as per HPI.   Physical Exam: Blood pressure 117/80, pulse (!) 115, temperature 97.9 F (36.6 C), temperature source Oral, resp. rate 18, height 5\' 6"  (1.676 m), weight 270 lb (122.5 kg), SpO2 96 %. Physical Exam  Constitutional: He is oriented to person, place, and time. He appears well-developed and well-nourished.  Eyes: Right eye exhibits no discharge. Left eye exhibits no discharge. No scleral icterus.  Neck: JVD present.  JVD approx 6cm H2O with giant A waves seen  Cardiovascular: Regular rhythm, normal heart sounds and intact distal pulses. Exam reveals no gallop and no friction rub.  No murmur heard. Pulmonary/Chest: Effort normal and breath sounds normal. No respiratory distress. He has no wheezes. He has no rales.  Abdominal: Soft. Bowel sounds are normal. He exhibits no distension and no mass. There is no tenderness. There is no guarding.  Musculoskeletal: He exhibits edema (1+ peripheral edema bilaterally).  Neurological: He is alert and oriented to person, place, and time.    EKG: personally reviewed my interpretation is sinus tachycardia, probable LAE, RBBB, LAFB  CXR: personally reviewed my interpretation is none available  Assessment & Plan by Problem: Active Problems:   DKA (diabetic ketoacidoses) (HCC)  27 yo morbidly obese poorly controlled T2DM pt presents with DKA and chest pain  DKA Recurrent issue with this patient, due to lack of follow up and likely poor compliance with medications.    -IVF, K supplementation -Insulin drip, will transition to basal bolus insulin when anion gap closes x2   Chest Pain/SOB Likely due in part to acidosis and dehydration causing tachycardia in setting of DKA.  Pt has bifascicular block which is unusual for a 27 yo male.  His ECHO in October of last year showed few abnormalities but did show mild RV dilation.  It is possible that the patient has some mile PAH due to OSA.    -EKG -Trend troponins    Dispo:  Admit patient to Inpatient with expected length of stay greater than 2 midnights.  Signed: Angelita Ingles, MD 12/01/2017, 3:26 PM  Thornell Mule MD PGY-1 Internal Medicine Pager # [phone]

## 2017-12-01 NOTE — ED Provider Notes (Signed)
[facility] EMERGENCY DEPARTMENT Provider Note   CSN: [id number] Arrival date & time: 12/01/17  1010     History   Chief Complaint Chief Complaint  Patient presents with  . Emesis  . Diabetes    HPI [name] is a 27 y.o. male.  HPI  The patient is a 27 year old male who presents to the hospital with a complaint of persistent nausea and vomiting for the last several days.  The patient is a known diabetic, he was diagnosed in September 2018 and at that time the patient was started on medications including both oral medications and insulin.  The patient is insistent that he takes his insulin as per prescribed however it is noted in the chart by my medical record review that through previous admissions he had not been taking it as prescribed take for either financial reasons, not being able to get the medication refilled or just plain noncompliance.  He states that he has been using his insulin as prescribed, he has not been taking his oral medications, he denies any other oral medications or any other chronic medical issues.  The patient reports having the onset of nausea and vomiting several days ago, it has been persistent, gradually worsening, he is now getting lightheaded and notes that his heart is racing.  He denies dysuria, diarrhea, fever, chills, coughing, he does not have any swelling of the legs.  He has been taking his insulin but states his blood sugar has been reading "high" for the last 3 days  Past Medical History:  Diagnosis Date  . Asthma   . DKA (diabetic ketoacidoses) (HCC) 06/2017; 10/17/2017  . Hyperglycemia 09/24/2017  . Obesity   . Type II diabetes mellitus (HCC)    "dx'd 06/2017"    Patient Active Problem List   Diagnosis Date Noted  . Hyperglycemia 09/23/2017  . Right bundle branch block (RBBB) determined by electrocardiography 08/24/2017  . DKA, type 2 (HCC) 08/23/2017  . Type 2 diabetes mellitus (HCC) 08/01/2017  . Smoking  08/01/2017  . HTN (hypertension) 07/25/2017  . DKA (diabetic ketoacidoses) (HCC) 07/24/2017    Past Surgical History:  Procedure Laterality Date  . LACERATION REPAIR Left    "stitched finger up"  . TONSILLECTOMY         Home Medications    Prior to Admission medications   Medication Sig Start Date End Date Taking? Authorizing Provider  glucose blood test strip Use as instructed 08/24/17   Scherrie Gerlach, MD  insulin aspart protamine- aspart (NOVOLOG MIX 70/30) (70-30) 100 UNIT/ML injection Inject 0.5 mLs (50 Units total) into the skin 2 (two) times daily with a meal. 10/19/17   Lanelle Bal, MD  lisinopril (PRINIVIL,ZESTRIL) 40 MG tablet Take 1 tablet (40 mg total) by mouth daily. 10/19/17   Lanelle Bal, MD  metFORMIN (GLUCOPHAGE) 500 MG tablet Take 1 tablet (500 mg total) by mouth 2 (two) times daily with a meal. 10/19/17   Lanelle Bal, MD    Family History No family history on file.  Social History Social History   Tobacco Use  . Smoking status: Current Every Day Smoker    Packs/day: 0.10    Years: 12.00    Pack years: 1.20    Types: Cigarettes  . Smokeless tobacco: Never Used  . Tobacco comment: cutting back - now 1-2 cigarettes per day  Substance Use Topics  . Alcohol use: Yes    Comment: 10/17/2017 "2 shots of liquor/month; maybe"  . Drug use: Yes  Types: Marijuana    Comment: 10/17/2017 ~3 times per week     Allergies   Vicodin [hydrocodone-acetaminophen]; Eggs or egg-derived products; and Lactose intolerance (gi)   Review of Systems Review of Systems  All other systems reviewed and are negative.    Physical Exam Updated Vital Signs BP 136/86 (BP Location: Left Arm)   Pulse (!) 119   Temp 97.9 F (36.6 C) (Oral)   Resp 15   Ht 5\' 6"  (1.676 m)   Wt 122.5 kg (270 lb)   SpO2 99%   BMI 43.58 kg/m   Physical Exam  Constitutional: He appears well-developed and well-nourished. He appears distressed.  HENT:  Head:  Normocephalic and atraumatic.  Mouth/Throat: No oropharyngeal exudate.  Dry MM  Eyes: Conjunctivae and EOM are normal. Pupils are equal, round, and reactive to light. Right eye exhibits no discharge. Left eye exhibits no discharge. No scleral icterus.  Neck: Normal range of motion. Neck supple. No JVD present. No thyromegaly present.  Cardiovascular: Regular rhythm, normal heart sounds and intact distal pulses. Exam reveals no gallop and no friction rub.  No murmur heard. Tachycardia, weak peripheral pulses  Pulmonary/Chest: Effort normal and breath sounds normal. No respiratory distress. He has no wheezes. He has no rales.  Abdominal: Soft. Bowel sounds are normal. He exhibits no distension and no mass. There is tenderness.  Mild diffuse abdominal tenderness, nonfocal, very soft, no guarding  Musculoskeletal: Normal range of motion. He exhibits no edema or tenderness.  Lymphadenopathy:    He has no cervical adenopathy.  Neurological: He is alert. Coordination normal.  The patient is slightly tired appearing  and able to follow commands, his speech is clear  Skin: Skin is warm and dry. No rash noted. No erythema.  Psychiatric: He has a normal mood and affect. His behavior is normal.  Nursing note and vitals reviewed.    ED Treatments / Results  Labs (all labs ordered are listed, but only abnormal results are displayed) Labs Reviewed  COMPREHENSIVE METABOLIC PANEL - Abnormal; Notable for the following components:      Result Value   CO2 <7 (*)    Glucose, Bld 547 (*)    Creatinine, Ser 1.47 (*)    Total Protein 8.9 (*)    AST 12 (*)    ALT 14 (*)    Alkaline Phosphatase 138 (*)    Total Bilirubin 1.8 (*)    All other components within normal limits  CBC - Abnormal; Notable for the following components:   RBC 6.00 (*)    Hemoglobin 18.4 (*)    HCT 52.6 (*)    Platelets 464 (*)    All other components within normal limits  URINALYSIS, ROUTINE W REFLEX MICROSCOPIC - Abnormal;  Notable for the following components:   Glucose, UA >=500 (*)    Hgb urine dipstick SMALL (*)    Ketones, ur 80 (*)    Protein, ur 30 (*)    All other components within normal limits  CBG MONITORING, ED - Abnormal; Notable for the following components:   Glucose-Capillary 540 (*)    All other components within normal limits  LIPASE, BLOOD  I-STAT CHEM 8, ED    EKG  EKG Interpretation None       Radiology No results found.  Procedures .Critical Care Performed by: Eber Hong, MD Authorized by: Eber Hong, MD   Critical care provider statement:    Critical care time (minutes):  35   Critical care time was exclusive  of:  Separately billable procedures and treating other patients and teaching time   Critical care was necessary to treat or prevent imminent or life-threatening deterioration of the following conditions:  Endocrine crisis   Critical care was time spent personally by me on the following activities:  Blood draw for specimens, development of treatment plan with patient or surrogate, discussions with consultants, evaluation of patient's response to treatment, examination of patient, obtaining history from patient or surrogate, ordering and performing treatments and interventions, ordering and review of laboratory studies, ordering and review of radiographic studies, pulse oximetry, re-evaluation of patient's condition and review of old charts   (including critical care time)  Medications Ordered in ED Medications  insulin regular (NOVOLIN R,HUMULIN R) 100 Units in sodium chloride 0.9 % 100 mL (1 Units/mL) infusion (4.8 Units/hr Intravenous New Bag/Given 12/01/17 1145)  dextrose 5 %-0.45 % sodium chloride infusion (not administered)  sodium chloride 0.9 % bolus 1,000 mL (1,000 mLs Intravenous New Bag/Given 12/01/17 1146)    And  sodium chloride 0.9 % bolus 1,000 mL (not administered)    And  sodium chloride 0.9 % bolus 1,000 mL (not administered)    And  0.9 %   sodium chloride infusion (not administered)     Initial Impression / Assessment and Plan / ED Course  I have reviewed the triage vital signs and the nursing notes.  Pertinent labs & imaging results that were available during my care of the patient were reviewed by me and considered in my medical decision making (see chart for details).    Sugar was over 500, the patient has been possibly noncompliant and appears dehydrated, he also appears to be very weak.  He is tachycardic to over 140 and will need IV fluid resuscitation as well as an insulin drip.  Labs ordered, the patient will need to be admitted to high level of care, he likely has diabetic ketoacidosis, critical care provided  Labs were drawn and show that the patient has significant ketones in the urine, no signs of infection  Lipase was normal  Metabolic panel shows severe DKA with acidosis with a bicarbonate that was not measurable as it was too low.  His creatinine is bumped at 1.47  Blood counts show that he is likely hemoconcentrated with a high hemoglobin, normal white blood cell count.  The patient has had the diabetic ketoacidosis protocol started with insulin drip, IV fluid resuscitation and I have discussed his care with the internal medicine resident who will admit.  Critical care services provided    Final Clinical Impressions(s) / ED Diagnoses   Final diagnoses:  Diabetic ketoacidosis without coma associated with type 1 diabetes mellitus (HCC)  AKI (acute kidney injury) [facility](HCC)    ED Discharge Orders    None       Eber HongMiller, Stevie Ertle, MD 12/01/17 1212

## 2017-12-02 ENCOUNTER — Encounter (HOSPITAL_COMMUNITY): Payer: Self-pay

## 2017-12-02 DIAGNOSIS — N179 Acute kidney failure, unspecified: Secondary | ICD-10-CM

## 2017-12-02 DIAGNOSIS — Z6841 Body Mass Index (BMI) 40.0 and over, adult: Secondary | ICD-10-CM

## 2017-12-02 DIAGNOSIS — T383X6D Underdosing of insulin and oral hypoglycemic [antidiabetic] drugs, subsequent encounter: Secondary | ICD-10-CM

## 2017-12-02 DIAGNOSIS — I452 Bifascicular block: Secondary | ICD-10-CM

## 2017-12-02 LAB — BASIC METABOLIC PANEL
Anion gap: 10 (ref 5–15)
Anion gap: 12 (ref 5–15)
Anion gap: 12 (ref 5–15)
BUN: 5 mg/dL — AB (ref 6–20)
BUN: 5 mg/dL — AB (ref 6–20)
BUN: 5 mg/dL — AB (ref 6–20)
CHLORIDE: 106 mmol/L (ref 101–111)
CHLORIDE: 105 mmol/L (ref 101–111)
CO2: 14 mmol/L — ABNORMAL LOW (ref 22–32)
CO2: 14 mmol/L — ABNORMAL LOW (ref 22–32)
CO2: 14 mmol/L — ABNORMAL LOW (ref 22–32)
CREATININE: 0.84 mg/dL (ref 0.61–1.24)
CREATININE: 0.75 mg/dL (ref 0.61–1.24)
CREATININE: 1.02 mg/dL (ref 0.61–1.24)
Calcium: 8.7 mg/dL — ABNORMAL LOW (ref 8.9–10.3)
Calcium: 8.7 mg/dL — ABNORMAL LOW (ref 8.9–10.3)
Calcium: 8.9 mg/dL (ref 8.9–10.3)
Chloride: 109 mmol/L (ref 101–111)
GFR calc Af Amer: >60 mL/min (ref >60)
GFR calc Af Amer: >60 mL/min (ref >60)
GFR calc Af Amer: >60 mL/min (ref >60)
GFR calc non Af Amer: >60 mL/min (ref >60)
GLUCOSE: 159 mg/dL — AB (ref 65–99)
GLUCOSE: 355 mg/dL — AB (ref 65–99)
Glucose, Bld: 161 mg/dL — ABNORMAL HIGH (ref 65–99)
Potassium: 3.6 mmol/L (ref 3.5–5.1)
Potassium: 4.2 mmol/L (ref 3.5–5.1)
Potassium: 4 mmol/L (ref 3.5–5.1)
SODIUM: 133 mmol/L — AB (ref 135–145)
SODIUM: 132 mmol/L — AB (ref 135–145)
SODIUM: 131 mmol/L — AB (ref 135–145)

## 2017-12-02 LAB — GLUCOSE, CAPILLARY
GLUCOSE-CAPILLARY: 154 mg/dL — AB (ref 65–99)
GLUCOSE-CAPILLARY: 154 mg/dL — AB (ref 65–99)
Glucose-Capillary: 178 mg/dL — ABNORMAL HIGH (ref 65–99)
Glucose-Capillary: 167 mg/dL — ABNORMAL HIGH (ref 65–99)
Glucose-Capillary: 177 mg/dL — ABNORMAL HIGH (ref 65–99)
Glucose-Capillary: 158 mg/dL — ABNORMAL HIGH (ref 65–99)
Glucose-Capillary: 148 mg/dL — ABNORMAL HIGH (ref 65–99)
Glucose-Capillary: 227 mg/dL — ABNORMAL HIGH (ref 65–99)
Glucose-Capillary: 359 mg/dL — ABNORMAL HIGH (ref 65–99)

## 2017-12-02 LAB — TROPONIN I: Troponin I: <0.03 ng/mL (ref <0.03)

## 2017-12-02 LAB — MRSA PCR SCREENING: MRSA BY PCR: NEGATIVE

## 2017-12-02 MED ORDER — INSULIN GLARGINE 100 UNIT/ML ~~LOC~~ SOLN
30.0000 [IU] | Freq: Once | SUBCUTANEOUS | Status: AC
  Administered 2017-12-02: 30 [IU] via SUBCUTANEOUS
  Filled 2017-12-02: qty 0.3

## 2017-12-02 MED ORDER — SODIUM CHLORIDE 0.9 % IV SOLN
INTRAVENOUS | Status: DC
  Administered 2017-12-02: 05:00:00 via INTRAVENOUS

## 2017-12-02 MED ORDER — INSULIN ASPART 100 UNIT/ML ~~LOC~~ SOLN
10.0000 [IU] | Freq: Three times a day (TID) | SUBCUTANEOUS | Status: DC
  Administered 2017-12-02 (×2): 10 [IU] via SUBCUTANEOUS

## 2017-12-02 MED ORDER — INSULIN ASPART 100 UNIT/ML ~~LOC~~ SOLN
0.0000 [IU] | Freq: Three times a day (TID) | SUBCUTANEOUS | Status: DC
  Administered 2017-12-02: 20 [IU] via SUBCUTANEOUS
  Administered 2017-12-02: 7 [IU] via SUBCUTANEOUS

## 2017-12-02 MED ORDER — INSULIN ASPART PROT & ASPART (70-30 MIX) 100 UNIT/ML ~~LOC~~ SUSP: 60.0000 [IU] | Freq: Two times a day (BID) | SUBCUTANEOUS | 11 refills | Status: DC

## 2017-12-02 MED ORDER — POTASSIUM CHLORIDE CRYS ER 20 MEQ PO TBCR
40.0000 meq | EXTENDED_RELEASE_TABLET | Freq: Once | ORAL | Status: AC
  Administered 2017-12-02: 40 meq via ORAL
  Filled 2017-12-02: qty 2

## 2017-12-02 NOTE — Progress Notes (Signed)
   Subjective: Patient was feeling better when seen this morning.  He denies any more nausea, vomiting or abdominal pain.  He was able to eat regular carb modified diet.  We had a long discussion with him regarding managing his blood sugar properly, he expresses some concern that he cannot afford the copayment resulted in multiple no shows in his clinic appointment. He was asked to apply for Medicaid and also given information about orange card.  Objective:  Vital signs in last 24 hours: Vitals:   12/01/17 1815 12/01/17 2008 12/02/17 0023 12/02/17 0422  BP: (!) 136/93 122/75 131/76 134/80  Pulse: (!) 105 90 97 94  Resp:  18 19 18   Temp: 98.2 F (36.8 C) 97.8 F (36.6 C) 98.3 F (36.8 C) 97.6 F (36.4 C)  TempSrc: Oral Oral Oral Oral  SpO2: 99% 100% 100% 98%  Weight: 254 lb 9.6 oz (115.5 kg)   256 lb 6.4 oz (116.3 kg)  Height: 5\' 6"  (1.676 m)      General.  Well-developed, obese gentleman, in no acute distress. Lungs.  Clear bilaterally. CVS.  Regular rate and rhythm. Abdomen.  Soft, nontender, bowel sounds positive. Extremities.  Trace to 1+ lower extremity edema bilaterally.  Pulses intact and symmetrical.  Assessment/Plan:  27 yo morbidly obese poorly controlled T2DM pt presents with nausea, vomiting, abdominal and chest pain.  Found to be in DKA.  DKA.  His gap Closes x2 early morning and he was transitioned to s/q Lantus and NovoLog.  Patient was able to tolerate diet very well.  His symptoms has been completely resolved.  He wants to go home today. We had a long discussion regarding compliance and the importance of controlling blood sugar.  Because of his concern about copayment and the cost of the medicine he was advised to apply for Medicaid as he is currently unemployed, he was also provided with information regarding orange card. -His home regimen of 70/30-50 units twice daily was increased to 60 units twice daily. -He was discharged home with recommendation to follow-up  in the clinic within next week.  Chest pain.  His chest pain resolved and he denies any more shortness of breath.  EKG shows persistent RBBB with left fascicular block, these changes were present in the EKG done in September 2018.  He will need a workup as an outpatient to rule out any reversible cause. He did not desaturate overnight but might get benefit with getting a sleep study because of his body habitus. His troponin x3 remain negative.  AK I.  Resolved with fluid.  Dispo: Being discharged today.  Arnetha CourserAmin, Elbridge Magowan, MD 12/02/2017, 10:05 AM Pager: [phone]

## 2017-12-02 NOTE — Discharge Summary (Signed)
Name: [name] MRN: [id number] DOB: July 29, 1991 27 y.o. PCP: Beola Cord, MD  Date of Admission: 12/01/2017 10:51 AM Date of Discharge: 12/02/2017 Attending Physician: No att. providers found  Discharge Diagnosis: 1. DKA  Active Problems:   DKA (diabetic ketoacidoses) [facility])   Discharge Medications: Allergies as of 12/02/2017      Reactions   Vicodin [hydrocodone-acetaminophen] Anaphylaxis   Tolerates tylenol with no allergy.   Eggs Or Egg-derived Products Nausea And Vomiting   Lactose Intolerance (gi) Rash      Medication List    TAKE these medications   glucose blood test strip Use as instructed   insulin aspart protamine- aspart (70-30) 100 UNIT/ML injection Commonly known as:  NOVOLOG MIX 70/30 Inject 0.6 mLs (60 Units total) into the skin 2 (two) times daily with a meal. What changed:  how much to take   lisinopril 40 MG tablet Commonly known as:  PRINIVIL,ZESTRIL Take 1 tablet (40 mg total) by mouth daily.   metFORMIN 500 MG tablet Commonly known as:  GLUCOPHAGE Take 1 tablet (500 mg total) by mouth 2 (two) times daily with a meal.       Disposition and follow-up:   [name] was discharged from [facility] in Good condition.  At the hospital follow up visit please address:  1.  He has very uncontrolled diabetes with multiple admissions for DKA within past few months.  He was missing appointments before because of the co-pay, message was sent to [facility] to eliminate that co-pay for possible.  He was also advised to apply for Medicaid and also given information about orange card.  Please follow-up on those issues. 2.  He also has a persistent RBBB, consider further workup to rule out or address any treatable cause, might get benefit with a sleep study to rule out OSA, although no desaturation recorded overnight.  2.  Labs / imaging needed at time of follow-up: BMP  3.  Pending labs/ test needing follow-up:  None  Follow-up Appointments:   Hospital Course by problem list: 27 yo morbidly obese poorly controlled T2DM pt presents with nausea, vomiting, abdominal and chest pain.  Found to be in DKA.  DKA.  Initially managed with DKA protocol, then switched to Lantus and NovoLog once gap Closes x2.  His symptoms resolved.  His BMP before discharge shows a bicarb of 14 with no gap.  He will need a repeat BMP to assure normalization of bicarb on follow-up. He has very uncontrolled diabetes with A1c above 14, missing multiple clinic visits, most likely noncompliant although stating that he uses his insulin regularly.  He never started Metformin yet.  His diabetes noted to be quite resistant to insulin.  He will get benefit with extensive counseling and financial help to control his diabetes to prevent any permanent damage.  Chest pain.  Initially he was complaining of atypical chest pain with some shortness of breath.  ACS ruled out with no acute change in EKG and negative troponin x3. EKG shows persistent RBBB with left fascicular block, these changes were present in the EKG done in September 2018.  He will need a workup as an outpatient to rule out any reversible cause. He did not desaturate overnight but might get benefit with getting a sleep study because of his body habitus.  He can be referred to cardiology as an outpatient once he can sort out about getting Medicaid or any other insurance help.  AKI.  Initially he was presented with AK  I, normal creatinine at baseline.  AK I resolved with fluid.  We held his lisinopril because of AKI, he remained normotensive.  He was advised to resume lisinopril on discharge.  Discharge Vitals:   BP (!) 148/88 (BP Location: Left Arm)   Pulse 94   Temp 98 F (36.7 C) (Oral)   Resp 18   Ht 5\' 6"  (1.676 m)   Wt 256 lb 6.4 oz (116.3 kg)   SpO2 98%   BMI 41.38 kg/m   General.  Well-developed, obese gentleman, in no acute distress. Lungs.  Clear  bilaterally. CVS.  Regular rate and rhythm. Abdomen.  Soft, nontender, bowel sounds positive. Extremities.  Trace to 1+ lower extremity edema bilaterally.  Pulses intact and symmetrical.  Pertinent Labs, Studies, and Procedures:  Recent Labs  Lab 12/01/17 1442 12/01/17 2114 12/02/17 0233 12/02/17 0624 12/02/17 1002  NA 139 133* 133* 132* 131*  K 4.5 4.0 3.6 4.2 4.0  CL 111 107 109 106 105  CO2 8* 11* 14* 14* 14*  GLUCOSE 342* 173* 161* 159* 355*  BUN 13 9 5* 5* 5*  CREATININE 1.32* 0.96 0.84 0.75 1.02  CALCIUM 9.0 8.8* 8.7* 8.7* 8.9   CBC    Component Value Date/Time   WBC 9.3 12/01/2017 1442   RBC 5.47 12/01/2017 1442   HGB 16.5 12/01/2017 1442   HCT 48.0 12/01/2017 1442   PLT 375 12/01/2017 1442   MCV 87.8 12/01/2017 1442   MCH 30.2 12/01/2017 1442   MCHC 34.4 12/01/2017 1442   RDW 15.4 12/01/2017 1442   Troponin <0.03 X 3.  Discharge Instructions: Discharge Instructions    Diet - low sodium heart healthy   Complete by:  As directed    Discharge instructions   Complete by:  As directed    It was pleasure taking care of you. I am increasing the dose of your 70/30 to 60 units twice daily, please keep checking your blood sugar 3-4 times every day and watch for any sign of low blood sugar. Please watch your diet carefully and avoid high sugary and high carbohydrate content. Please follow-up in clinic within next week.  Please call tomorrow morning to make an appointment. As we discussed please apply for Medicaid as you need all this assistance to take care of your diabetes and blood pressure. As we discussed we will try to help you with your clinic visits and getting your necessity medication as cheaper as possible. We do of her orange card for our patient, please see Rudell CobbDeborah Hill during your appointment for more information.   Increase activity slowly   Complete by:  As directed       Signed: Arnetha CourserAmin, Kayzlee Wirtanen, MD 12/02/2017, 2:31 PM   Pager: [phone]

## 2017-12-02 NOTE — Plan of Care (Signed)
12/01/17 19:45 Pt is in bed at this time with no complaints except that he wants to eat, VSS with the exception that his HR goes up into the 110's with any activity and MD Aware. Pt know how to use the call light and can call RN/NT using white board for numbers and his phone, all personal items within reach, will continue to monitor.

## 2017-12-02 NOTE — Discharge Instructions (Signed)
Diabetes Mellitus and Nutrition When you have diabetes (diabetes mellitus), it is very important to have healthy eating habits because your blood sugar (glucose) levels are greatly affected by what you eat and drink. Eating healthy foods in the appropriate amounts, at about the same times every day, can help you:  Control your blood glucose.  Lower your risk of heart disease.  Improve your blood pressure.  Reach or maintain a healthy weight.  Every person with diabetes is different, and each person has different needs for a meal plan. Your health care provider may recommend that you work with a diet and nutrition specialist (dietitian) to make a meal plan that is best for you. Your meal plan may vary depending on factors such as:  The calories you need.  The medicines you take.  Your weight.  Your blood glucose, blood pressure, and cholesterol levels.  Your activity level.  Other health conditions you have, such as heart or kidney disease.  How do carbohydrates affect me? Carbohydrates affect your blood glucose level more than any other type of food. Eating carbohydrates naturally increases the amount of glucose in your blood. Carbohydrate counting is a method for keeping track of how many carbohydrates you eat. Counting carbohydrates is important to keep your blood glucose at a healthy level, especially if you use insulin or take certain oral diabetes medicines. It is important to know how many carbohydrates you can safely have in each meal. This is different for every person. Your dietitian can help you calculate how many carbohydrates you should have at each meal and for snack. Foods that contain carbohydrates include:  Bread, cereal, rice, pasta, and crackers.  Potatoes and corn.  Peas, beans, and lentils.  Milk and yogurt.  Fruit and juice.  Desserts, such as cakes, cookies, ice cream, and candy.  How does alcohol affect me? Alcohol can cause a sudden decrease in blood  glucose (hypoglycemia), especially if you use insulin or take certain oral diabetes medicines. Hypoglycemia can be a life-threatening condition. Symptoms of hypoglycemia (sleepiness, dizziness, and confusion) are similar to symptoms of having too much alcohol. If your health care provider says that alcohol is safe for you, follow these guidelines:  Limit alcohol intake to no more than 1 drink per day for nonpregnant women and 2 drinks per day for men. One drink equals 12 oz of beer, 5 oz of wine, or 1 oz of hard liquor.  Do not drink on an empty stomach.  Keep yourself hydrated with water, diet soda, or unsweetened iced tea.  Keep in mind that regular soda, juice, and other mixers may contain a lot of sugar and must be counted as carbohydrates.  What are tips for following this plan? Reading food labels  Start by checking the serving size on the label. The amount of calories, carbohydrates, fats, and other nutrients listed on the label are based on one serving of the food. Many foods contain more than one serving per package.  Check the total grams (g) of carbohydrates in one serving. You can calculate the number of servings of carbohydrates in one serving by dividing the total carbohydrates by 15. For example, if a food has 30 g of total carbohydrates, it would be equal to 2 servings of carbohydrates.  Check the number of grams (g) of saturated and trans fats in one serving. Choose foods that have low or no amount of these fats.  Check the number of milligrams (mg) of sodium in one serving. Most people  should limit total sodium intake to less than 2,300 mg per day.  Always check the nutrition information of foods labeled as "low-fat" or "nonfat". These foods may be higher in added sugar or refined carbohydrates and should be avoided.  Talk to your dietitian to identify your daily goals for nutrients listed on the label. Shopping  Avoid buying canned, premade, or processed foods. These  foods tend to be high in fat, sodium, and added sugar.  Shop around the outside edge of the grocery store. This includes fresh fruits and vegetables, bulk grains, fresh meats, and fresh dairy. Cooking  Use low-heat cooking methods, such as baking, instead of high-heat cooking methods like deep frying.  Cook using healthy oils, such as olive, canola, or sunflower oil.  Avoid cooking with butter, cream, or high-fat meats. Meal planning  Eat meals and snacks regularly, preferably at the same times every day. Avoid going long periods of time without eating.  Eat foods high in fiber, such as fresh fruits, vegetables, beans, and whole grains. Talk to your dietitian about how many servings of carbohydrates you can eat at each meal.  Eat 4-6 ounces of lean protein each day, such as lean meat, chicken, fish, eggs, or tofu. 1 ounce is equal to 1 ounce of meat, chicken, or fish, 1 egg, or 1/4 cup of tofu.  Eat some foods each day that contain healthy fats, such as avocado, nuts, seeds, and fish. Lifestyle   Check your blood glucose regularly.  Exercise at least 30 minutes 5 or more days each week, or as told by your health care provider.  Take medicines as told by your health care provider.  Do not use any products that contain nicotine or tobacco, such as cigarettes and e-cigarettes. If you need help quitting, ask your health care provider.  Work with a Social worker or diabetes educator to identify strategies to manage stress and any emotional and social challenges. What are some questions to ask my health care provider?  Do I need to meet with a diabetes educator?  Do I need to meet with a dietitian?  What number can I call if I have questions?  When are the best times to check my blood glucose? Where to find more information:  American Diabetes Association: diabetes.org/food-and-fitness/food  Academy of Nutrition and Dietetics:  PokerClues.dk  [employer] of Diabetes and Digestive and Kidney Diseases (NIH): ContactWire.be Summary  A healthy meal plan will help you control your blood glucose and maintain a healthy lifestyle.  Working with a diet and nutrition specialist (dietitian) can help you make a meal plan that is best for you.  Keep in mind that carbohydrates and alcohol have immediate effects on your blood glucose levels. It is important to count carbohydrates and to use alcohol carefully. This information is not intended to replace advice given to you by your health care provider. Make sure you discuss any questions you have with your health care provider. Document Released: 07/13/2005 Document Revised: 11/20/2016 Document Reviewed: 11/20/2016 Elsevier Interactive Patient Education  2018 [employer].   Diabetic Ketoacidosis Diabetic ketoacidosis is a life-threatening complication of diabetes. If it is not treated, it can cause severe dehydration and organ damage and can lead to a coma or death. What are the causes? This condition develops when there is not enough of the hormone insulin in the body. Insulin helps the body to break down sugar for energy. Without insulin, the body cannot break down sugar, so it breaks down fats instead. This  leads to the production of acids that are called ketones. Ketones are poisonous at high levels. This condition can be triggered by:  Stress on the body that is brought on by an illness.  Medicines that raise blood glucose levels.  Not taking diabetes medicine.  What are the signs or symptoms? Symptoms of this condition include:  Fatigue.  Weight loss.  Excessive thirst.  Light-headedness.  Fruity or sweet-smelling breath.  Excessive urination.  Vision changes.  Confusion or irritability.  Nausea.  Vomiting.  Rapid  breathing.  Abdominal pain.  Feeling flushed.  How is this diagnosed? This condition is diagnosed based on a medical history, a physical exam, and blood tests. You may also have a urine test that checks for ketones. How is this treated? This condition may be treated with:  Fluid replacement. This may be done to correct dehydration.  Insulin injections. These may be given through the skin or through an IV tube.  Electrolyte replacement. Electrolytes, such as potassium and sodium, may be given in pill form or through an IV tube.  Antibiotic medicines. These may be prescribed if your condition was caused by an infection.  Follow these instructions at home: Eating and drinking  Drink enough fluids to keep your urine clear or pale yellow.  If you cannot eat, alternate between drinking fluids with sugar (such as juice) and salty fluids (such as broth or bouillon).  If you can eat, follow your usual diet and drink sugar-free liquids, such as water. Other Instructions   Take insulin as directed by your health care provider. Do not skip insulin injections. Do not use expired insulin.  If your blood sugar is over 240 mg/dL, monitor your urine ketones every 4-6 hours.  If you were prescribed an antibiotic medicine, finish all of it even if you start to feel better.  Rest and exercise only as directed by your health care provider.  If you get sick, call your health care provider and begin treatment quickly. Your body often needs extra insulin to fight an illness.  Check your blood glucose levels regularly. If your blood glucose is high, drink plenty of fluids. This helps to flush out ketones. Contact a health care provider if:  Your blood glucose level is too high or too low.  You have ketones in your urine.  You have a fever.  You cannot eat.  You cannot tolerate fluids.  You have been vomiting for more than 2 hours.  You continue to have symptoms of this condition.  You  develop new symptoms. Get help right away if:  Your blood glucose levels continue to be high (elevated).  Your monitor reads high even when you are taking insulin.  You faint.  You have chest pain.  You have trouble breathing.  You have a sudden, severe headache.  You have sudden weakness in one arm or one leg.  You have sudden trouble speaking or swallowing.  You have vomiting or diarrhea that gets worse after 3 hours.  You feel severely fatigued.  You have trouble thinking.  You have abdominal pain.  You are severely dehydrated. Symptoms of severe dehydration include: ? Extreme thirst. ? Dry mouth. ? Blue lips. ? Cold hands and feet. ? Rapid breathing. This information is not intended to replace advice given to you by your health care provider. Make sure you discuss any questions you have with your health care provider. Document Released: 10/13/2000 Document Revised: 03/23/2016 Document Reviewed: 09/23/2014 Elsevier Interactive Patient Education  2017  [employer].   Blood Glucose Monitoring, Adult Monitoring your blood sugar (glucose) helps you manage your diabetes. It also helps you and your health care provider determine how well your diabetes management plan is working. Blood glucose monitoring involves checking your blood glucose as often as directed, and keeping a record (log) of your results over time. Why should I monitor my blood glucose? Checking your blood glucose regularly can:  Help you understand how food, exercise, illnesses, and medicines affect your blood glucose.  Let you know what your blood glucose is at any time. You can quickly tell if you are having low blood glucose (hypoglycemia) or high blood glucose (hyperglycemia).  Help you and your health care provider adjust your medicines as needed.  When should I check my blood glucose? Follow instructions from your health care provider about how often to check your blood glucose. This may depend  on:  The type of diabetes you have.  How well-controlled your diabetes is.  Medicines you are taking.  If you have type 1 diabetes:  Check your blood glucose at least 2 times a day.  Also check your blood glucose: ? Before every insulin injection. ? Before and after exercise. ? Between meals. ? 2 hours after a meal. ? Occasionally between 2:00 a.m. and 3:00 a.m., as directed. ? Before potentially dangerous tasks, like driving or using heavy machinery. ? At bedtime.  You may need to check your blood glucose more often, up to 6-10 times a day: ? If you use an insulin pump. ? If you need multiple daily injections (MDI). ? If your diabetes is not well-controlled. ? If you are ill. ? If you have a history of severe hypoglycemia. ? If you have a history of not knowing when your blood glucose is getting low (hypoglycemia unawareness). If you have type 2 diabetes:  If you take insulin or other diabetes medicines, check your blood glucose at least 2 times a day.  If you are on intensive insulin therapy, check your blood glucose at least 4 times a day. Occasionally, you may also need to check between 2:00 a.m. and 3:00 a.m., as directed.  Also check your blood glucose: ? Before and after exercise. ? Before potentially dangerous tasks, like driving or using heavy machinery.  You may need to check your blood glucose more often if: ? Your medicine is being adjusted. ? Your diabetes is not well-controlled. ? You are ill. What is a blood glucose log?  A blood glucose log is a record of your blood glucose readings. It helps you and your health care provider: ? Look for patterns in your blood glucose over time. ? Adjust your diabetes management plan as needed.  Every time you check your blood glucose, write down your result and notes about things that may be affecting your blood glucose, such as your diet and exercise for the day.  Most glucose meters store a record of glucose  readings in the meter. Some meters allow you to download your records to a computer. How do I check my blood glucose? Follow these steps to get accurate readings of your blood glucose: Supplies needed   Blood glucose meter.  Test strips for your meter. Each meter has its own strips. You must use the strips that come with your meter.  A needle to prick your finger (lancet). Do not use lancets more than once.  A device that holds the lancet (lancing device).  A journal or log book to write  down your results. Procedure  Wash your hands with soap and water.  Prick the side of your finger (not the tip) with the lancet. Use a different finger each time.  Gently rub the finger until a small drop of blood appears.  Follow instructions that come with your meter for inserting the test strip, applying blood to the strip, and using your blood glucose meter.  Write down your result and any notes. Alternative testing sites  Some meters allow you to use areas of your body other than your finger (alternative sites) to test your blood.  If you think you may have hypoglycemia, or if you have hypoglycemia unawareness, do not use alternative sites. Use your finger instead.  Alternative sites may not be as accurate as the fingers, because blood flow is slower in these areas. This means that the result you get may be delayed, and it may be different from the result that you would get from your finger.  The most common alternative sites are: ? Forearm. ? Thigh. ? Palm of the hand. Additional tips  Always keep your supplies with you.  If you have questions or need help, all blood glucose meters have a 24-hour hotline number that you can call. You may also contact your health care provider.  After you use a few boxes of test strips, adjust (calibrate) your blood glucose meter by following instructions that came with your meter. This information is not intended to replace advice given to you by  your health care provider. Make sure you discuss any questions you have with your health care provider. Document Released: 10/19/2003 Document Revised: 05/05/2016 Document Reviewed: 03/27/2016 Elsevier Interactive Patient Education  2017 [place].   Correction Insulin Correction insulin, also called corrective insulin or a supplemental dose, is a small amount of insulin that can be used to lower your blood sugar (glucose) if it is too high. You may be instructed to check your blood glucose at certain times of the day and to use correction insulin as needed to lower your blood glucose to your target range. Correction insulin is primarily used as part of diabetes management. It may also be prescribed for people who do not have diabetes. What is a correction scale? A correction scale, also called a sliding scale, is prescribed by your health care provider to help you determine when you need correction insulin. Your correction scale is based on your individual treatment goals, and it has two parts:  Ranges of blood glucose levels.  How much correction insulin to give yourself if your blood sugar falls within a certain range.  If your blood glucose is in your desired range, you will not need correction insulin and you should take your normal insulin dose. What type of insulin do I need? Your health care provider may prescribe rapid-acting or short-acting insulin for you to use as correction insulin. Rapid-acting insulin:  Starts working quickly, in as little as 5 minutes.  Can last for 3-6 hours.  Works well when taken right before a meal to quickly lower blood glucose. Short-acting insulin:  Starts working in about 30 minutes.  Can last for 6-8 hours.  Should be taken about 30 minutes before you start eating a meal. Talk with your health care provider or pharmacist about which type of correction insulin to take and when to take it. If you use insulin to control your diabetes, you  should use correction insulin in addition to the longer-acting (basal) insulin that you normally use.  How do I manage my blood glucose with correction insulin? Giving a correction dose  Check your blood glucose as directed by your health care provider.  Use your correction scale to find the range that your blood glucose is in.  Identify the units of insulin that match your blood glucose range.  Make sure you have food available that you can eat in the next 15-30 minutes, after your correction dose.  Give yourself the dose of correction insulin that your health care provider has prescribed in your correction scale. Always make sure you are using the right type of insulin. ? If your correction insulin is rapid-acting, start eating a meal within 15 minutes after your correction dose to keep your blood glucose from getting too low. ? If your correction insulin is short-acting, start eating a meal within 30 minutes after your correction dose to keep your blood glucose from getting too low. Keeping a blood glucose log  Write down your blood glucose test results and the amount of insulin that you give yourself. Do this every time you check blood glucose or take insulin. Bring this log with you to your medical visits. This information will help your health care provider to manage your medicines.  Note anything that may affect your blood glucose, such as: ? Changes in normal exercise or activity. ? Changes in your normal schedule, such as changes in your sleep routine, going on vacation, changing your diet, or holidays. ? New over-the-counter or prescription medicines. ? Illness, stress, or anxiety. ? Changes in the time that you took your medicine or insulin. ? Changes in your meals, such as skipping a meal, having a late meal, or dining out. ? Eating things that may affect blood glucose, such as snacks, meal portions that are larger than normal, drinks that contain sugar, or eating less than  usual. What do I need to know about hyperglycemia and hypoglycemia? What is hyperglycemia? Hyperglycemia, also called high blood glucose, occurs when blood glucose is too high. Make sure you know the early signs of hyperglycemia, such as:  Increased thirst.  Hunger.  Feeling very tired.  Needing to urinate more often than usual.  Blurry vision.  What is hypoglycemia? Hypoglycemia is also called low blood glucose. Be aware of stacking your insulin doses. This happens when you correct a high blood glucose by giving yourself extra insulin too soon after a previous correction dose or mealtime dose. This may cause you to have too much insulin in your body and may put you at risk for hypoglycemia. Hypoglycemia occurs with a blood glucose level at or below 70 mg/dL (3.9 mmol/L). It is important to know the symptoms of hypoglycemia and treat it right away. Always have a 15-gram rapid-acting carbohydrate snack with you to treat low blood glucose. Family members and close friends should also know the symptoms and should understand how to treat hypoglycemia, in case you are not able to treat yourself. What are the symptoms of hypoglycemia? Hypoglycemia symptoms can include:  Hunger.  Anxiety.  Sweating and feeling clammy.  Confusion.  Dizziness or light-headedness.  Sleepiness.  Nausea.  Increased heart rate.  Headache.  Blurry vision.  Jerky movements that you cannot control (seizure).  Nightmares.  Tingling or numbness around the mouth, lips, or tongue.  A change in speech.  Decreased ability to concentrate.  A change in coordination.  Restless sleep.  Tremors or shakes.  Fainting.  Irritability.  How do I treat hypoglycemia? If you are alert and  able to swallow safely, follow the 15:15 rule:  Take 15 grams of a rapid-acting carbohydrate. Rapid-acting options include: ? 1 tube of glucose gel. ? 3 glucose pills. ? 6-8 pieces of hard candy. ? 4 oz (120 mL)  of fruit juice. ? 4 oz (120 mL) of regular (not diet) soda.  Check your blood glucose 15 minutes after you take the carbohydrate. ? If the repeat blood glucose level is still at or below 70 mg/dL (3.9 mmol/L), take 15 grams of a carbohydrate again. ? If your blood glucose level does not increase above 70 mg/dL (3.9 mmol/L) after 3 tries, seek emergency medical care.  After your blood glucose level returns to normal, eat a meal or a snack within 1 hour.  How do I treat severe hypoglycemia? Severe hypoglycemia is when your blood glucose level is at or below 54 mg/dL (3 mmol/L). Severe hypoglycemia is an emergency. Do not wait to see if the symptoms will go away. Get medical help right away. Call your local emergency services (911 in the [place] Do not drive yourself to the hospital. If you have severe hypoglycemia and you cannot eat or drink, you may need an injection of glucagon. A family member or close friend should learn how to check your blood glucose and how to give you a glucagon injection. Ask your health care provider if you need to have an emergency glucagon injection kit available. Severe hypoglycemia may need to be treated in a hospital. The treatment may include getting glucose through an IV tube. You may also need treatment for the cause of your hypoglycemia. Why do I need correction insulin if I do not have diabetes? If you do not have diabetes, your health care provider may prescribe insulin because:  Keeping your blood glucose in the target range is important for your overall health.  You are taking medicines that cause your blood glucose to be higher than normal.  Contact a health care provider if:  You develop low blood glucose that you are not able to treat yourself.  You have high blood glucose that you are not able to correct with correction insulin.  Your blood glucose is often too low.  You used emergency glucagon to treat low blood glucose. Get help right away  if:  You become unresponsive. If this happens, someone else should call emergency services (911 in the [place] right away.  Your blood glucose is lower than 54 mg/dL (3.0 mmol/L).  You become confused or you have trouble thinking clearly.  You have difficulty breathing. Summary  Correction insulin is a small amount of insulin that can be used to lower your blood sugar (glucose) if it is too high.  Talk with your health care provider or pharmacist about which type of correction insulin to take and when to take it. If you use insulin to control your diabetes, you should use correction insulin in addition to the longer-acting (basal) insulin that you normally use.  You may be instructed to check your blood glucose at certain times of the day and to use correction insulin as needed to lower your blood glucose to your target range. Always keep a log of your blood glucose values and the amount of insulin that you used.  It is important to know the symptoms of hypoglycemia and treat it right away. Always have a 15-gram rapid-acting carbohydrate snack with you to treat low blood glucose. Family members and close friends should also know the symptoms and should understand how  to treat hypoglycemia, in case you are not able to treat yourself. This information is not intended to replace advice given to you by your health care provider. Make sure you discuss any questions you have with your health care provider. Document Released: 03/09/2011 Document Revised: 07/14/2016 Document Reviewed: 07/14/2016 Elsevier Interactive Patient Education  [employer].

## 2017-12-12 ENCOUNTER — Telehealth: Payer: Self-pay | Admitting: Internal Medicine

## 2017-12-12 NOTE — Telephone Encounter (Signed)
Attempted to contact patient for an appointment tomorrow with Dr.Melvin.  No answer, but was able to leave message asking to please call clinic back if able to be seen tomorrow.

## 2017-12-13 ENCOUNTER — Encounter: Payer: Self-pay | Admitting: Internal Medicine

## 2018-01-01 ENCOUNTER — Other Ambulatory Visit: Payer: Self-pay

## 2018-01-01 ENCOUNTER — Encounter: Payer: Self-pay | Admitting: Internal Medicine

## 2018-01-01 ENCOUNTER — Encounter (INDEPENDENT_AMBULATORY_CARE_PROVIDER_SITE_OTHER): Payer: Self-pay

## 2018-01-01 ENCOUNTER — Ambulatory Visit (INDEPENDENT_AMBULATORY_CARE_PROVIDER_SITE_OTHER): Payer: Self-pay | Admitting: Internal Medicine

## 2018-01-01 VITALS — BP 142/86 | HR 87 | Temp 98.1°F | Ht 66.0 in | Wt 253.1 lb

## 2018-01-01 DIAGNOSIS — I10 Essential (primary) hypertension: Secondary | ICD-10-CM

## 2018-01-01 DIAGNOSIS — E1110 Type 2 diabetes mellitus with ketoacidosis without coma: Secondary | ICD-10-CM

## 2018-01-01 DIAGNOSIS — Z794 Long term (current) use of insulin: Secondary | ICD-10-CM

## 2018-01-01 DIAGNOSIS — Z79899 Other long term (current) drug therapy: Secondary | ICD-10-CM

## 2018-01-01 DIAGNOSIS — E1165 Type 2 diabetes mellitus with hyperglycemia: Secondary | ICD-10-CM

## 2018-01-01 LAB — GLUCOSE, CAPILLARY: Glucose-Capillary: 276 mg/dL — ABNORMAL HIGH (ref 65–99)

## 2018-01-01 LAB — POCT GLYCOSYLATED HEMOGLOBIN (HGB A1C): Hemoglobin A1C: >14

## 2018-01-01 MED ORDER — INSULIN ASPART PROT & ASPART (70-30 MIX) 100 UNIT/ML ~~LOC~~ SUSP: 65.0000 [IU] | Freq: Two times a day (BID) | SUBCUTANEOUS | 11 refills | Status: DC

## 2018-01-01 MED ORDER — LISINOPRIL 40 MG PO TABS: 40.0000 mg | ORAL_TABLET | Freq: Every day | ORAL | 3 refills | Status: DC

## 2018-01-01 MED ORDER — METFORMIN HCL 1000 MG PO TABS: 1000.0000 mg | ORAL_TABLET | Freq: Two times a day (BID) | ORAL | 3 refills | Status: DC

## 2018-01-01 NOTE — Patient Instructions (Addendum)
Thank you for allowing us to care for you  For your diabetes: - Your A1c Today remains elevated at >14, blood sugar is improved today at 276 - We have increased you dose of Metformin to 1000mg  Twice a day - We have increased your Insulin dose to 70/30 65 Units BID - Please work on diet and exercise as well - Please bring your meter to your follow up visit  For your hypertension: - Elevated today at 142/86, though you have been out of your medications - Will refill your Lisinopril 40mg  Daily - Recheck at follow up visit  Please follow up in about 3 months

## 2018-01-01 NOTE — Progress Notes (Signed)
   CC: Diabetes, Hypertension  HPI:  Mr.Geramy Lall is a 27 y.o. M with PMHx listed below presenting for Diabetes, Hypertension. Please see the A&P for the status of the patient's chronic medical problems.   Past Medical History:  Diagnosis Date  . Asthma   . DKA (diabetic ketoacidoses) (HCC) 06/2017; 10/17/2017  . Hyperglycemia 09/24/2017  . Obesity   . Type II diabetes mellitus (HCC)    "dx'd 06/2017"   Review of Systems: Performed and all others negative.  Physical Exam:  There were no vitals filed for this visit. Physical Exam  Constitutional: He is oriented to person, place, and time. He appears well-developed and well-nourished. No distress.  HENT:  Head: Normocephalic and atraumatic.  Cardiovascular: Normal rate, regular rhythm, normal heart sounds and intact distal pulses.  Pulmonary/Chest: Effort normal and breath sounds normal. No respiratory distress.  Abdominal: Soft. Bowel sounds are normal. He exhibits no distension. There is no tenderness.  Musculoskeletal: He exhibits no edema.  Neurological: He is alert and oriented to person, place, and time.  Skin: Skin is warm and dry.    Assessment & Plan:   See Encounters Tab for problem based charting.  Patient discussed with Dr. Josem KaufmannKlima

## 2018-01-01 NOTE — Assessment & Plan Note (Addendum)
A1c today remains elevated at >14 (same as previous). Random Blood sugar 276; consistent with his report home readings and improved from previous presentations. He has been taking his insulin 70/30 60U BID, but has run out of Metformin. Blood sugars at home have been running in the 200's-300's, but he did not bring his meter today. He denies any side effects including diarrhea with his medications. He denies increased urination or thirst as he has experienced in the past. With persistently elevated A1c we will increase insulin dose to 65 Units BID and restart metformin at 1000mg  BID. - Insulin 70/30 65U BID - Metformin 1000mg  BID - Foot exam today - Patient is to start working on obtaining orange card, will refer to ophthalmology when able - Further review shows urinalysis for microalbuminuria has not been done, will order at follow up. (Patient is on lisinopril already for HTN)

## 2018-01-01 NOTE — Assessment & Plan Note (Signed)
Patient blood pressure is elevated to at 143/86. This is improved from last check of 152/92 despite being out of his medication. We will refill his Lisinopril 40mg  Daily today and revaluate at follow up visit. - Lisinopril 40mg  daily

## 2018-01-08 NOTE — Progress Notes (Signed)
Case discussed with Dr. Alinda MoneyMelvin at the time of the visit.  We reviewed the resident's history and exam and pertinent patient test results.  I agree with the assessment, diagnosis and plan of care documented in the resident's note with the following additions and clarifications:  [name] continues to have poor control of his diabetes.  The adjustments made were minimal given concerns for compliance issues in the past and thus over treating leading to potential hypoglycemia and its dangerous sequelae.  Once we feel he has been compliant with his insulin therapy we can be more aggressive with his adjustments in dosing if his diabetes remains poorly controlled.  He will also require considerable education about lifestyle, including diet.  We will therefore inquire about an interest in diabetic teaching at the follow-up visit.  Finally, his blood pressure control is sub-optimal, and if his blood pressure remains elevated with compliance taking his ACEI we will need to add an additional agent.

## 2018-02-09 ENCOUNTER — Emergency Department (HOSPITAL_COMMUNITY)
Admission: EM | Admit: 2018-02-09 | Discharge: 2018-02-09 | Disposition: A | Discharge status: Home / Self Care | Payer: Self-pay | Attending: Emergency Medicine | Admitting: Emergency Medicine

## 2018-02-09 ENCOUNTER — Other Ambulatory Visit: Payer: Self-pay

## 2018-02-09 DIAGNOSIS — J029 Acute pharyngitis, unspecified: Secondary | ICD-10-CM | POA: Insufficient documentation

## 2018-02-09 LAB — CBC
HEMATOCRIT: 40.8 % (ref 39.0–52.0)
Hemoglobin: 14.2 g/dL (ref 13.0–17.0)
MCH: 30.5 pg (ref 26.0–34.0)
MCHC: 34.8 g/dL (ref 30.0–36.0)
MCV: 87.7 fL (ref 78.0–100.0)
Platelets: 233 10*3/uL (ref 150–400)
RBC: 4.65 MIL/uL (ref 4.22–5.81)
RDW: 14.1 % (ref 11.5–15.5)
WBC: 9.4 10*3/uL (ref 4.0–10.5)

## 2018-02-09 LAB — URINALYSIS, ROUTINE W REFLEX MICROSCOPIC
BACTERIA UA: NONE SEEN
Bilirubin Urine: NEGATIVE
Glucose, UA: >=500 mg/dL — AB
Hgb urine dipstick: NEGATIVE
Ketones, ur: 80 mg/dL — AB
Leukocytes, UA: NEGATIVE
NITRITE: NEGATIVE
PH: 6 (ref 5.0–8.0)
Protein, ur: NEGATIVE mg/dL
SPECIFIC GRAVITY, URINE: 1.023 (ref 1.005–1.030)

## 2018-02-09 LAB — CBG MONITORING, ED: GLUCOSE-CAPILLARY: 515 mg/dL — AB (ref 65–99)

## 2018-02-09 LAB — BASIC METABOLIC PANEL
Anion gap: 15 (ref 5–15)
BUN: 9 mg/dL (ref 6–20)
CALCIUM: 8.9 mg/dL (ref 8.9–10.3)
CHLORIDE: 96 mmol/L — AB (ref 101–111)
CO2: 19 mmol/L — AB (ref 22–32)
Creatinine, Ser: 0.91 mg/dL (ref 0.61–1.24)
GFR calc non Af Amer: >60 mL/min (ref >60)
GLUCOSE: 497 mg/dL — AB (ref 65–99)
Potassium: 4.1 mmol/L (ref 3.5–5.1)
Sodium: 130 mmol/L — ABNORMAL LOW (ref 135–145)

## 2018-02-09 LAB — GROUP A STREP BY PCR: Group A Strep by PCR: NOT DETECTED

## 2018-02-09 MED ORDER — SODIUM CHLORIDE 0.9 % IV BOLUS: 1000.0000 mL | Freq: Once | INTRAVENOUS | Status: DC

## 2018-02-09 NOTE — ED Provider Notes (Signed)
MSE was initiated and I personally evaluated the patient and placed orders (if any) at  6:36 AM on February 09, 2018.  The patient appears stable so that the remainder of the MSE may be completed by another provider.  Chief Complaint: Sore throat  HPI:   Patient presents with 24 hours of sore throat.  He also reports that his blood sugars are high.  He reports he is an insulin-dependent diabetic and his blood sugars normally run in the 300-600 range.  He reports dysuria, polyuria and polydipsia over the last few days.  He reports last time he had a sore throat he was in DKA.  ROS: Sore throat, dysuria, polyuria, polydipsia  Physical Exam:   Gen: No distress  Neuro: Awake and Alert  Skin: Warm     Focused Exam: HEENT: Posterior oropharynx is erythematous without edema or tonsillar exudate.  TMs without erythema, bulging or effusion.    Initiation of care has begun. The patient has been counseled on the process, plan, and necessity for staying for the completion/evaluation, and the remainder of the medical screening examination    Mardene SayerMuthersbaugh, Boyd KerbsHannah, PA-C 02/09/18 [id number]    Melene PlanFloyd, Dan, DO 02/09/18 2309

## 2018-02-09 NOTE — ED Notes (Signed)
Called for room, no answer

## 2018-02-09 NOTE — ED Triage Notes (Signed)
Pt has had a sore throat for the past two days, no fevers, hurts to swallow. Pt also requesting his CBG checked .

## 2018-02-09 NOTE — ED Notes (Signed)
Called multiple times with no answer in lobby 

## 2018-02-09 NOTE — ED Notes (Signed)
Called for vitals no answer x2  

## 2018-03-06 ENCOUNTER — Encounter (HOSPITAL_COMMUNITY): Payer: Self-pay | Admitting: Emergency Medicine

## 2018-03-06 ENCOUNTER — Ambulatory Visit: Payer: Self-pay

## 2018-03-06 ENCOUNTER — Observation Stay (HOSPITAL_COMMUNITY)
Admission: EM | Admit: 2018-03-06 | Discharge: 2018-03-07 | Disposition: A | Discharge status: Home / Self Care | Payer: Self-pay | Attending: Internal Medicine | Admitting: Internal Medicine

## 2018-03-06 DIAGNOSIS — Z5321 Procedure and treatment not carried out due to patient leaving prior to being seen by health care provider: Secondary | ICD-10-CM

## 2018-03-06 DIAGNOSIS — E1110 Type 2 diabetes mellitus with ketoacidosis without coma: Secondary | ICD-10-CM | POA: Diagnosis present

## 2018-03-06 DIAGNOSIS — E860 Dehydration: Secondary | ICD-10-CM | POA: Insufficient documentation

## 2018-03-06 DIAGNOSIS — F17210 Nicotine dependence, cigarettes, uncomplicated: Secondary | ICD-10-CM | POA: Insufficient documentation

## 2018-03-06 DIAGNOSIS — Z79899 Other long term (current) drug therapy: Secondary | ICD-10-CM | POA: Insufficient documentation

## 2018-03-06 DIAGNOSIS — E739 Lactose intolerance, unspecified: Secondary | ICD-10-CM | POA: Insufficient documentation

## 2018-03-06 DIAGNOSIS — Z833 Family history of diabetes mellitus: Secondary | ICD-10-CM | POA: Insufficient documentation

## 2018-03-06 DIAGNOSIS — Z91012 Allergy to eggs: Secondary | ICD-10-CM

## 2018-03-06 DIAGNOSIS — I10 Essential (primary) hypertension: Secondary | ICD-10-CM | POA: Insufficient documentation

## 2018-03-06 DIAGNOSIS — Z91011 Allergy to milk products: Secondary | ICD-10-CM

## 2018-03-06 DIAGNOSIS — Z885 Allergy status to narcotic agent status: Secondary | ICD-10-CM | POA: Insufficient documentation

## 2018-03-06 DIAGNOSIS — Z9114 Patient's other noncompliance with medication regimen: Secondary | ICD-10-CM

## 2018-03-06 DIAGNOSIS — E1010 Type 1 diabetes mellitus with ketoacidosis without coma: Principal | ICD-10-CM | POA: Insufficient documentation

## 2018-03-06 DIAGNOSIS — Z794 Long term (current) use of insulin: Secondary | ICD-10-CM | POA: Insufficient documentation

## 2018-03-06 DIAGNOSIS — J45909 Unspecified asthma, uncomplicated: Secondary | ICD-10-CM | POA: Insufficient documentation

## 2018-03-06 DIAGNOSIS — E1065 Type 1 diabetes mellitus with hyperglycemia: Secondary | ICD-10-CM | POA: Insufficient documentation

## 2018-03-06 LAB — BASIC METABOLIC PANEL
ANION GAP: 20 — AB (ref 5–15)
ANION GAP: 16 — AB (ref 5–15)
BUN: 12 mg/dL (ref 6–20)
BUN: 12 mg/dL (ref 6–20)
CALCIUM: 9.7 mg/dL (ref 8.9–10.3)
CO2: 8 mmol/L — ABNORMAL LOW (ref 22–32)
CO2: 12 mmol/L — ABNORMAL LOW (ref 22–32)
Calcium: 9.7 mg/dL (ref 8.9–10.3)
Chloride: 114 mmol/L — ABNORMAL HIGH (ref 101–111)
Chloride: 112 mmol/L — ABNORMAL HIGH (ref 101–111)
Creatinine, Ser: 1.33 mg/dL — ABNORMAL HIGH (ref 0.61–1.24)
Creatinine, Ser: 1.31 mg/dL — ABNORMAL HIGH (ref 0.61–1.24)
GLUCOSE: 309 mg/dL — AB (ref 65–99)
Glucose, Bld: 319 mg/dL — ABNORMAL HIGH (ref 65–99)
Potassium: 4.7 mmol/L (ref 3.5–5.1)
Potassium: 5.1 mmol/L (ref 3.5–5.1)
Sodium: 142 mmol/L (ref 135–145)
Sodium: 140 mmol/L (ref 135–145)

## 2018-03-06 LAB — I-STAT CHEM 8, ED
BUN: 18 mg/dL (ref 6–20)
CHLORIDE: 112 mmol/L — AB (ref 101–111)
Calcium, Ion: 1.28 mmol/L (ref 1.15–1.40)
Creatinine, Ser: 0.6 mg/dL — ABNORMAL LOW (ref 0.61–1.24)
GLUCOSE: 622 mg/dL — AB (ref 65–99)
HCT: 54 % — ABNORMAL HIGH (ref 39.0–52.0)
Hemoglobin: 18.4 g/dL — ABNORMAL HIGH (ref 13.0–17.0)
POTASSIUM: 5.4 mmol/L — AB (ref 3.5–5.1)
SODIUM: 139 mmol/L (ref 135–145)
TCO2: 9 mmol/L — ABNORMAL LOW (ref 22–32)

## 2018-03-06 LAB — URINALYSIS, ROUTINE W REFLEX MICROSCOPIC
BACTERIA UA: NONE SEEN
BILIRUBIN URINE: NEGATIVE
Glucose, UA: >=500 mg/dL — AB
Hgb urine dipstick: NEGATIVE
KETONES UR: 80 mg/dL — AB
LEUKOCYTES UA: NEGATIVE
NITRITE: NEGATIVE
PROTEIN: NEGATIVE mg/dL
Specific Gravity, Urine: 1.02 (ref 1.005–1.030)
pH: 5 (ref 5.0–8.0)

## 2018-03-06 LAB — CBC WITH DIFFERENTIAL/PLATELET
BASOS PCT: 0 %
Basophils Absolute: 0 10*3/uL (ref 0.0–0.1)
EOS ABS: 0 10*3/uL (ref 0.0–0.7)
Eosinophils Relative: 0 %
HCT: 48.9 % (ref 39.0–52.0)
Hemoglobin: 17.2 g/dL — ABNORMAL HIGH (ref 13.0–17.0)
Lymphocytes Relative: 22 %
Lymphs Abs: 2.2 10*3/uL (ref 0.7–4.0)
MCH: 31.9 pg (ref 26.0–34.0)
MCHC: 35.2 g/dL (ref 30.0–36.0)
MCV: 90.7 fL (ref 78.0–100.0)
MONO ABS: 0.4 10*3/uL (ref 0.1–1.0)
MONOS PCT: 4 %
Neutro Abs: 7.2 10*3/uL (ref 1.7–7.7)
Neutrophils Relative %: 74 %
PLATELETS: 346 10*3/uL (ref 150–400)
RBC: 5.39 MIL/uL (ref 4.22–5.81)
RDW: 13.6 % (ref 11.5–15.5)
WBC: 9.9 10*3/uL (ref 4.0–10.5)

## 2018-03-06 LAB — I-STAT VENOUS BLOOD GAS, ED
ACID-BASE DEFICIT: 20 mmol/L — AB (ref 0.0–2.0)
BICARBONATE: 8.6 mmol/L — AB (ref 20.0–28.0)
O2 Saturation: 54 %
PH VEN: 7.093 — AB (ref 7.250–7.430)
TCO2: 9 mmol/L — ABNORMAL LOW (ref 22–32)
pCO2, Ven: 28.2 mmHg — ABNORMAL LOW (ref 44.0–60.0)
pO2, Ven: 38 mmHg (ref 32.0–45.0)

## 2018-03-06 LAB — COMPREHENSIVE METABOLIC PANEL
ALT: 13 U/L — AB (ref 17–63)
AST: 12 U/L — ABNORMAL LOW (ref 15–41)
Albumin: 4 g/dL (ref 3.5–5.0)
Alkaline Phosphatase: 127 U/L — ABNORMAL HIGH (ref 38–126)
Anion gap: 27 — ABNORMAL HIGH (ref 5–15)
BILIRUBIN TOTAL: 1.9 mg/dL — AB (ref 0.3–1.2)
BUN: 15 mg/dL (ref 6–20)
CALCIUM: 9.7 mg/dL (ref 8.9–10.3)
CO2: 8 mmol/L — AB (ref 22–32)
CREATININE: 1.45 mg/dL — AB (ref 0.61–1.24)
Chloride: 105 mmol/L (ref 101–111)
GFR calc non Af Amer: >60 mL/min (ref >60)
GLUCOSE: 600 mg/dL — AB (ref 65–99)
Potassium: 5.4 mmol/L — ABNORMAL HIGH (ref 3.5–5.1)
SODIUM: 140 mmol/L (ref 135–145)
TOTAL PROTEIN: 7.8 g/dL (ref 6.5–8.1)

## 2018-03-06 LAB — CBG MONITORING, ED
GLUCOSE-CAPILLARY: 505 mg/dL — AB (ref 65–99)
GLUCOSE-CAPILLARY: 459 mg/dL — AB (ref 65–99)
GLUCOSE-CAPILLARY: 360 mg/dL — AB (ref 65–99)
GLUCOSE-CAPILLARY: 309 mg/dL — AB (ref 65–99)
GLUCOSE-CAPILLARY: 302 mg/dL — AB (ref 65–99)
Glucose-Capillary: 451 mg/dL — ABNORMAL HIGH (ref 65–99)
Glucose-Capillary: 285 mg/dL — ABNORMAL HIGH (ref 65–99)

## 2018-03-06 MED ORDER — ENOXAPARIN SODIUM 40 MG/0.4ML ~~LOC~~ SOLN: 40.0000 mg | SUBCUTANEOUS | Status: DC

## 2018-03-06 MED ORDER — SODIUM CHLORIDE 0.9 % IV SOLN
INTRAVENOUS | Status: DC
  Administered 2018-03-06 (×2): via INTRAVENOUS

## 2018-03-06 MED ORDER — DEXTROSE-NACL 5-0.45 % IV SOLN: INTRAVENOUS | Status: DC

## 2018-03-06 MED ORDER — ONDANSETRON HCL 4 MG PO TABS: 4.0000 mg | ORAL_TABLET | Freq: Four times a day (QID) | ORAL | Status: DC | PRN

## 2018-03-06 MED ORDER — SODIUM CHLORIDE 0.9 % IV SOLN: INTRAVENOUS | Status: DC

## 2018-03-06 MED ORDER — SODIUM CHLORIDE 0.9 % IV SOLN
INTRAVENOUS | Status: DC
  Administered 2018-03-06: 4.5 [IU]/h via INTRAVENOUS
  Filled 2018-03-06: qty 1

## 2018-03-06 MED ORDER — LISINOPRIL 20 MG PO TABS
40.0000 mg | ORAL_TABLET | Freq: Every day | ORAL | Status: DC
  Administered 2018-03-06: 40 mg via ORAL
  Filled 2018-03-06: qty 2

## 2018-03-06 MED ORDER — ONDANSETRON HCL 4 MG/2ML IJ SOLN: 4.0000 mg | Freq: Four times a day (QID) | INTRAMUSCULAR | Status: DC | PRN

## 2018-03-06 MED ORDER — SODIUM CHLORIDE 0.9% FLUSH
3.0000 mL | Freq: Two times a day (BID) | INTRAVENOUS | Status: DC
  Administered 2018-03-06: 3 mL via INTRAVENOUS

## 2018-03-06 MED ORDER — SODIUM CHLORIDE 0.9 % IV BOLUS
1000.0000 mL | Freq: Once | INTRAVENOUS | Status: AC
  Administered 2018-03-06: 1000 mL via INTRAVENOUS

## 2018-03-06 MED ORDER — ALUM & MAG HYDROXIDE-SIMETH 200-200-20 MG/5ML PO SUSP
30.0000 mL | Freq: Once | ORAL | Status: AC
  Administered 2018-03-06: 30 mL via ORAL
  Filled 2018-03-06: qty 30

## 2018-03-06 NOTE — ED Triage Notes (Signed)
Type 1 diabetic coming from home. [facility] 557. 300cc NS in. Patient feels weak. Nausea and vomiting earlier. A&Ox4. States taken meds as prescribed and states eating well. Had appt today at [facility].

## 2018-03-06 NOTE — H&P (Signed)
Date: 03/06/2018               Patient Name:  [name] MRN: [id number]  DOB: 1991/01/12 Age / Sex: 27 y.o., male   PCP: Beola Cord, MD         Medical Service: Internal Medicine Teaching Service         Attending Physician: Dr. Burns Spain, MD    First Contact: Brooke Bonito Pager: [phone]  Second Contact: Dr. Arnetha Courser Pager: [phone]       After Hours (After 5p/  First Contact Pager: [phone]  weekends / holidays): Second Contact Pager: [phone]   Chief Complaint: DKA  History of Present Illness: Patient is a 27 year old male with insulin-dependent diabetes mellitus, asthma, morbid obesity.    [name] is a 27 year old man with insulin-requiring diabetes diagnosed in September 2018 and complicated by 5 subsequent episodes of DKA who presents with fatigue, chest pain, nausea and vomiting. He states that his stomach has been bloated for the last few days. This morning after attempting to drink a ginger ale for nausea which caused him to vomit. He reports feeling weak and unsteady, almost passing out on his girlfriend. He experienced a solitary episode of chest pain while in the ambulance. He denies any shortness of breath prior to or during this admission. He reports polyuria without dysuria. He therefore presented to the emergency department for further evaluation. In the emergency department he was found to have an anion gap metabolic acidosis with a bicarbonate of 8 and a blood sugar of 600.  [name] was asked if he felt as though his recurrent DKA episodes were a result of him not being on the appropriate medication or him not taking medications as he should, and he agreed that he was not taking medications as he is supposed to. He states that despite being prescribed 55u Novolog 70/30 BID he only took 55u once yesterday around 3 PM. He is also on metformin BID 1000 mg.  Recent psychological stressors include moving today, which caused him to  miss his scheduled [facility] appointment.  ED course: Patient arrived in DKA with a bicarb of 8 and a blood sugar of 600 w/anion gap of 27.  He was hemoconcentrated from severe dehydration, had urine ketones on UA.  He was started on IV fluids and insulin drip per DKA protocol.    Meds:  Current Meds  Medication Sig  . insulin aspart protamine- aspart (NOVOLOG MIX 70/30) (70-30) 100 UNIT/ML injection Inject 0.65 mLs (65 Units total) into the skin 2 (two) times daily with a meal. (Patient taking differently: Inject 55 Units into the skin 2 (two) times daily with a meal. )  . lisinopril (PRINIVIL,ZESTRIL) 40 MG tablet Take 1 tablet (40 mg total) by mouth daily.  . metFORMIN (GLUCOPHAGE) 1000 MG tablet Take 1 tablet (1,000 mg total) by mouth 2 (two) times daily with a meal.     Allergies: Allergies as of 03/06/2018 - Review Complete 03/06/2018  Allergen Reaction Noted  . Vicodin [hydrocodone-acetaminophen] Anaphylaxis 01/22/2014  . Eggs or egg-derived products Nausea And Vomiting 07/25/2017  . Lactose intolerance (gi) Rash 01/22/2014   Past Medical History:  Diagnosis Date  . Asthma   . DKA (diabetic ketoacidoses) (HCC) 06/2017; 10/17/2017  . Hyperglycemia 09/24/2017  . Obesity   . Type II diabetes mellitus (HCC)    "dx'd 06/2017"    Family History: Mom with DM, age of onset unknown.  Social History:  Social History  Tobacco Use  . Smoking status: Current Every Day Smoker    Packs/day: 0.10    Years: 12.00    Pack years: 1.20    Types: Cigarettes  . Smokeless tobacco: Never Used  . Tobacco comment: cutting back - now 1-2 cigarettes per day  Substance Use Topics  . Alcohol use: Yes    Comment: 10/17/2017 "2 shots of liquor/month; maybe"  . Drug use: Yes    Types: Marijuana    Comment: 10/17/2017 ~3 times per week    Review of Systems: A complete ROS was negative except as per HPI.   Physical Exam: Blood pressure (!) 150/90, pulse (!) 113, resp. rate (!) 21, height   (1.676 m), weight 195 lb (88.5 kg), SpO2 100 %.  Physical Exam  Constitutional: He is oriented to person, place, and time. He appears well-developed and well-nourished. He appears ill.  HENT:  Head: Normocephalic and atraumatic.  Cardiovascular: Normal rate and regular rhythm.  Pulmonary/Chest: Effort normal and breath sounds normal.  Abdominal: Soft. Bowel sounds are normal.  Neurological: He is alert and oriented to person, place, and time.  Skin: Skin is warm and dry.  Psychiatric: He has a normal mood and affect. His behavior is normal.     Lab Results  Component Value Date   WBC 9.9 03/06/2018   HGB 18.4 (H) 03/06/2018   HCT 54.0 (H) 03/06/2018   PLT 346 03/06/2018   GLUCOSE 622 (HH) 03/06/2018   ALT 13 (L) 03/06/2018   AST 12 (L) 03/06/2018   NA 139 03/06/2018   K 5.4 (H) 03/06/2018   CL 112 (H) 03/06/2018   CREATININE 0.60 (L) 03/06/2018   BUN 18 03/06/2018   CO2 8 (L) 03/06/2018   HGBA1C >14.0 01/01/2018   BMP Latest Ref Rng & Units 03/06/2018 03/06/2018 02/09/2018  Glucose 65 - 99 mg/dL 161(WR) 604(VW) 098(J)  BUN 6 - 20 mg/dL Creatinine 0.61 - 1.24 mg/dL 1.91(Y) 7.82(N) 5.62  Sodium 135 - 145 mmol/L 139 140 130(L)  Potassium 3.5 - 5.1 mmol/L 5.4(H) 5.4(H) 4.1  Chloride 101 - 111 mmol/L 112(H) 105 96(L)  CO2 22 - 32 mmol/L - 8(L) 19(L)  Calcium 8.9 - 10.3 mg/dL - 9.7 8.9    EKG: not obtained  CXR: not obtained  Assessment & Plan by Problem: Active Problems:   DKA (diabetic ketoacidoses) (HCC)  Diabetic Ketoacidosis: Patient presented with N/V, abdominal pain and chest pain consistent with prior episodes of diabetic ketoacidosis. BG 600, urine positive for ketones. Anion gap of 27. Given that he has had 6 episodes in the last year and age at diagnosis, consider testing for antibodies and referral to endocrinologist. Patient rarely follows up in outpatient clinic and notes that he has not been consistent with his prescribed regimen of 55u Novolog 70-30  BID and  metformin BID. Recurrent issue with this patient, due to lack of follow up and likely poor compliance with medications.   -IVF, K supplementation -Insulin drip, will transition to basal bolus insulin (40u Lantus) when anion gap closes x2  HTN: Continue home lisinopril.  Dispo: Admit patient to Observation with expected length of stay less than 2 midnights.  Signed: Dow Adolph, Medical Student 03/06/2018, 7:28 PM  Pager: [phone]  Attestation for Student Documentation:  I personally was present and performed or re-performed the history, physical exam and medical decision-making activities of this service and have verified that the service and findings are accurately documented in the student's note.  Arnetha Courser, MD 03/06/2018, 8:03 PM

## 2018-03-06 NOTE — ED Provider Notes (Signed)
[name] Provider Note   CSN: [id number] Arrival date & time: 03/06/18  1430     History   Chief Complaint Chief Complaint  Patient presents with  . Hyperglycemia    HPI [name] is a 27 y.o. male.  HPI   Patient is a 27 year old male with a history of uncontrolled type 1 diabetes and hypertension who presents to the emergency department for evaluation of hyperglycemia.  Patient reports that he has not checked his blood sugar at home for months, thinks he lost his glucometer.  States that he takes 55 units of NovoLog 70/30 twice daily and has been taking this as prescribed.  Is seen by the Internal Medicine Residency Service.  Endorses polydipsia, polyuria and feeling very fatigued.  This morning he felt as if his abdomen was distended and tried drinking some ginger ale, but had immediate emesis following.  States that he felt very lightheaded and his vision tunneled in around him after he had vomited.  He did not lose consciousness but started to fall over with his wife catching him prior to falling.  This prompted family members to call EMS.  Patient reports that he is feeling much improved since receiving 2 L IV fluids in route.  He denies fevers, chills, visual disturbance, headache, cough, chest pain, shortness of breath, abdominal pain, dysuria, flank pain, numbness, weakness.  States that he has had several admissions due to DKA in the past.  Past Medical History:  Diagnosis Date  . Asthma   . DKA (diabetic ketoacidoses) (HCC) 06/2017; 10/17/2017  . Hyperglycemia 09/24/2017  . Obesity   . Type II diabetes mellitus (HCC)    "dx'd 06/2017"    Patient Active Problem List   Diagnosis Date Noted  . Right bundle branch block (RBBB) determined by electrocardiography 08/24/2017  . Type 2 diabetes mellitus (HCC) 08/01/2017  . Smoking 08/01/2017  . HTN (hypertension) 07/25/2017    Past Surgical History:  Procedure Laterality Date   . LACERATION REPAIR Left    "stitched finger up"  . TONSILLECTOMY          Home Medications    Prior to Admission medications   Medication Sig Start Date End Date Taking? Authorizing Provider  glucose blood test strip Use as instructed 08/24/17   Scherrie Gerlach, MD  insulin aspart protamine- aspart (NOVOLOG MIX 70/30) (70-30) 100 UNIT/ML injection Inject 0.65 mLs (65 Units total) into the skin 2 (two) times daily with a meal. 01/01/18   Beola Cord, MD  lisinopril (PRINIVIL,ZESTRIL) 40 MG tablet Take 1 tablet (40 mg total) by mouth daily. 01/01/18   Beola Cord, MD  metFORMIN (GLUCOPHAGE) 1000 MG tablet Take 1 tablet (1,000 mg total) by mouth 2 (two) times daily with a meal. 01/01/18   Beola Cord, MD    Family History History reviewed. No pertinent family history.  Social History Social History   Tobacco Use  . Smoking status: Current Every Day Smoker    Packs/day: 0.10    Years: 12.00    Pack years: 1.20    Types: Cigarettes  . Smokeless tobacco: Never Used  . Tobacco comment: cutting back - now 1-2 cigarettes per day  Substance Use Topics  . Alcohol use: Yes    Comment: 10/17/2017 "2 shots of liquor/month; maybe"  . Drug use: Yes    Types: Marijuana    Comment: 10/17/2017 ~3 times per week     Allergies   Vicodin [hydrocodone-acetaminophen]; Eggs or egg-derived products; and  Lactose intolerance (gi)   Review of Systems Review of Systems  Constitutional: Positive for fatigue. Negative for chills and fever.  HENT: Negative for congestion and sore throat.   Eyes: Negative for visual disturbance.  Respiratory: Negative for shortness of breath.   Cardiovascular: Negative for chest pain.  Gastrointestinal: Positive for abdominal distention, nausea and vomiting. Negative for abdominal pain and diarrhea.  Endocrine: Positive for polydipsia and polyuria.  Genitourinary: Negative for difficulty urinating and dysuria.  Musculoskeletal: Negative for gait  problem.  Skin: Negative for rash.  Neurological: Positive for light-headedness. Negative for speech difficulty, weakness, numbness and headaches.     Physical Exam Updated Vital Signs BP (!) 141/86   Pulse (!) 115   Resp (!) 22   Ht  (1.676 m)   Wt 88.5 kg (195 lb)   SpO2 99%   BMI 31.47 kg/m   Physical Exam  Constitutional: He appears well-developed and well-nourished. No distress.  HENT:  Head: Normocephalic and atraumatic.  Mucous memories dry.  Eyes: Pupils are equal, round, and reactive to light. Conjunctivae are normal. Right eye exhibits no discharge. Left eye exhibits no discharge.  Neck: Normal range of motion. Neck supple.  Cardiovascular:  No murmur heard. Tachycardic, regular rhythm.  Pulmonary/Chest: Effort normal. No respiratory distress.  No Kussmaul breathing.  Lungs clear to auscultation.  Abdominal: Soft. Bowel sounds are normal. He exhibits no distension. There is no tenderness. There is no guarding.  Musculoskeletal: Normal range of motion.  Neurological: He is alert. Coordination normal.  Skin: Skin is warm and dry. Capillary refill takes less than 2 seconds. He is not diaphoretic.  Psychiatric: He has a normal mood and affect. His behavior is normal.  Nursing note and vitals reviewed.    ED Treatments / Results  Labs (all labs ordered are listed, but only abnormal results are displayed) Labs Reviewed  COMPREHENSIVE METABOLIC PANEL - Abnormal; Notable for the following components:      Result Value   Potassium 5.4 (*)    CO2 8 (*)    Glucose, Bld 600 (*)    Creatinine, Ser 1.45 (*)    AST 12 (*)    ALT 13 (*)    Alkaline Phosphatase 127 (*)    Total Bilirubin 1.9 (*)    Anion gap 27 (*)    All other components within normal limits  CBC WITH DIFFERENTIAL/PLATELET - Abnormal; Notable for the following components:   Hemoglobin 17.2 (*)    All other components within normal limits  URINALYSIS, ROUTINE W REFLEX MICROSCOPIC - Abnormal;  Notable for the following components:   Color, Urine STRAW (*)    Glucose, UA >=500 (*)    Ketones, ur 80 (*)    All other components within normal limits  CBG MONITORING, ED - Abnormal; Notable for the following components:   Glucose-Capillary 505 (*)    All other components within normal limits  I-STAT CHEM 8, ED - Abnormal; Notable for the following components:   Potassium 5.4 (*)    Chloride 112 (*)    Creatinine, Ser 0.60 (*)    Glucose, Bld 622 (*)    TCO2 9 (*)    Hemoglobin 18.4 (*)    HCT 54.0 (*)    All other components within normal limits  I-STAT VENOUS BLOOD GAS, ED - Abnormal; Notable for the following components:   pH, Ven 7.093 (*)    pCO2, Ven 28.2 (*)    Bicarbonate 8.6 (*)    TCO2 9 (*)  Acid-base deficit 20.0 (*)    All other components within normal limits    EKG None  Radiology No results found.  Procedures Procedures (including critical care time)  CRITICAL CARE Performed by: Kellie Shropshire   Total critical care time: 35 minutes  Critical care time was exclusive of separately billable procedures and treating other patients.  Critical care was necessary to treat or prevent imminent or life-threatening deterioration.  Critical care was time spent personally by me on the following activities: development of treatment plan with patient and/or surrogate as well as nursing, discussions with consultants, evaluation of patient's response to treatment, examination of patient, obtaining history from patient or surrogate, ordering and performing treatments and interventions, ordering and review of laboratory studies, ordering and review of radiographic studies, pulse oximetry and re-evaluation of patient's condition.   Medications Ordered in ED Medications  dextrose 5 %-0.45 % sodium chloride infusion (has no administration in time range)  insulin regular (NOVOLIN R,HUMULIN R) 100 Units in sodium chloride 0.9 % 100 mL (1 Units/mL) infusion (4.5  Units/hr Intravenous New Bag/Given 03/06/18 1608)  ondansetron (ZOFRAN) injection 4 mg (has no administration in time range)  sodium chloride 0.9 % bolus 1,000 mL (0 mLs Intravenous Stopped 03/06/18 1555)  sodium chloride 0.9 % bolus 1,000 mL (0 mLs Intravenous Stopped 03/06/18 1555)  sodium chloride 0.9 % bolus 1,000 mL (0 mLs Intravenous Stopped 03/06/18 1632)    Initial Impression / Assessment and Plan / ED Course  I have reviewed the triage vital signs and the nursing notes.  Pertinent labs & imaging results that were available during my care of the patient were reviewed by me and considered in my medical decision making (see chart for details).    Patient will be admitted to the internal medicine residency service for DKA.  He has known uncontrolled type 1 diabetes, admitted several times for DKA over the past year.  On exam he is afebrile and nontoxic-appearing.  A&O x 3.  Tachycardic.  No Kussmaul breathing.  Abdomen soft and nontender.   Blood glucose 622 on arrival. Anion Gap 27. Acidotic VBG (pH 7.093.) DKA protocol initiated. No leukocytosis, fever or concern for underlying infectious process. Discussed this patient with Internal Medicine who will admit.  Final Clinical Impressions(s) / ED Diagnoses   Final diagnoses:  None    ED Discharge Orders    None       Lawrence Marseilles 03/06/18 Hulda Humphrey, MD 03/07/18 [phone]

## 2018-03-06 NOTE — ED Notes (Addendum)
Labwork drawn from Pt at 14:50:   Burna Mortimer, Lav, and Blue top on bedside table in Pt's room in Specimen bag.  Dark Green top on rocker in [employer]  No orders for [employer] yet.

## 2018-03-07 ENCOUNTER — Emergency Department (HOSPITAL_COMMUNITY): Payer: Self-pay

## 2018-03-07 ENCOUNTER — Observation Stay (HOSPITAL_COMMUNITY)
Admission: EM | Admit: 2018-03-07 | Discharge: 2018-03-09 | Disposition: A | Discharge status: Home / Self Care | Payer: Self-pay | Attending: Internal Medicine | Admitting: Internal Medicine

## 2018-03-07 ENCOUNTER — Encounter (HOSPITAL_COMMUNITY): Payer: Self-pay | Admitting: Emergency Medicine

## 2018-03-07 DIAGNOSIS — Z885 Allergy status to narcotic agent status: Secondary | ICD-10-CM

## 2018-03-07 DIAGNOSIS — Z79899 Other long term (current) drug therapy: Secondary | ICD-10-CM | POA: Insufficient documentation

## 2018-03-07 DIAGNOSIS — Z91012 Allergy to eggs: Secondary | ICD-10-CM | POA: Insufficient documentation

## 2018-03-07 DIAGNOSIS — Z794 Long term (current) use of insulin: Secondary | ICD-10-CM | POA: Insufficient documentation

## 2018-03-07 DIAGNOSIS — E669 Obesity, unspecified: Secondary | ICD-10-CM | POA: Insufficient documentation

## 2018-03-07 DIAGNOSIS — F17210 Nicotine dependence, cigarettes, uncomplicated: Secondary | ICD-10-CM | POA: Insufficient documentation

## 2018-03-07 DIAGNOSIS — Z6835 Body mass index (BMI) 35.0-35.9, adult: Secondary | ICD-10-CM | POA: Insufficient documentation

## 2018-03-07 DIAGNOSIS — I10 Essential (primary) hypertension: Secondary | ICD-10-CM | POA: Diagnosis present

## 2018-03-07 DIAGNOSIS — I129 Hypertensive chronic kidney disease with stage 1 through stage 4 chronic kidney disease, or unspecified chronic kidney disease: Secondary | ICD-10-CM | POA: Insufficient documentation

## 2018-03-07 DIAGNOSIS — I4510 Unspecified right bundle-branch block: Secondary | ICD-10-CM | POA: Insufficient documentation

## 2018-03-07 DIAGNOSIS — J45909 Unspecified asthma, uncomplicated: Secondary | ICD-10-CM | POA: Insufficient documentation

## 2018-03-07 DIAGNOSIS — F1099 Alcohol use, unspecified with unspecified alcohol-induced disorder: Secondary | ICD-10-CM

## 2018-03-07 DIAGNOSIS — N179 Acute kidney failure, unspecified: Secondary | ICD-10-CM | POA: Insufficient documentation

## 2018-03-07 DIAGNOSIS — E1110 Type 2 diabetes mellitus with ketoacidosis without coma: Principal | ICD-10-CM | POA: Diagnosis present

## 2018-03-07 DIAGNOSIS — Z91011 Allergy to milk products: Secondary | ICD-10-CM | POA: Insufficient documentation

## 2018-03-07 DIAGNOSIS — K219 Gastro-esophageal reflux disease without esophagitis: Secondary | ICD-10-CM

## 2018-03-07 DIAGNOSIS — Z720 Tobacco use: Secondary | ICD-10-CM

## 2018-03-07 DIAGNOSIS — Z833 Family history of diabetes mellitus: Secondary | ICD-10-CM

## 2018-03-07 DIAGNOSIS — E1165 Type 2 diabetes mellitus with hyperglycemia: Secondary | ICD-10-CM | POA: Insufficient documentation

## 2018-03-07 DIAGNOSIS — N189 Chronic kidney disease, unspecified: Secondary | ICD-10-CM | POA: Insufficient documentation

## 2018-03-07 DIAGNOSIS — E1122 Type 2 diabetes mellitus with diabetic chronic kidney disease: Secondary | ICD-10-CM | POA: Insufficient documentation

## 2018-03-07 LAB — COMPREHENSIVE METABOLIC PANEL
ALT: 12 U/L — AB (ref 17–63)
AST: 12 U/L — ABNORMAL LOW (ref 15–41)
Albumin: 3.7 g/dL (ref 3.5–5.0)
Alkaline Phosphatase: 101 U/L (ref 38–126)
Anion gap: 27 — ABNORMAL HIGH (ref 5–15)
BUN: 17 mg/dL (ref 6–20)
CHLORIDE: 103 mmol/L (ref 101–111)
CO2: 7 mmol/L — AB (ref 22–32)
Calcium: 9.8 mg/dL (ref 8.9–10.3)
Creatinine, Ser: 1.75 mg/dL — ABNORMAL HIGH (ref 0.61–1.24)
GFR calc non Af Amer: 53 mL/min — ABNORMAL LOW (ref >60)
Glucose, Bld: 612 mg/dL (ref 65–99)
Potassium: 5.5 mmol/L — ABNORMAL HIGH (ref 3.5–5.1)
SODIUM: 137 mmol/L (ref 135–145)
Total Bilirubin: 1.9 mg/dL — ABNORMAL HIGH (ref 0.3–1.2)
Total Protein: 7.3 g/dL (ref 6.5–8.1)

## 2018-03-07 LAB — URINALYSIS, ROUTINE W REFLEX MICROSCOPIC
BILIRUBIN URINE: NEGATIVE
Bacteria, UA: NONE SEEN
KETONES UR: 80 mg/dL — AB
LEUKOCYTES UA: NEGATIVE
NITRITE: NEGATIVE
Protein, ur: NEGATIVE mg/dL
SPECIFIC GRAVITY, URINE: 1.016 (ref 1.005–1.030)
pH: 5 (ref 5.0–8.0)

## 2018-03-07 LAB — BASIC METABOLIC PANEL
Anion gap: 16 — ABNORMAL HIGH (ref 5–15)
Anion gap: 11 (ref 5–15)
BUN: 16 mg/dL (ref 6–20)
BUN: 14 mg/dL (ref 6–20)
BUN: 8 mg/dL (ref 6–20)
CALCIUM: 9.4 mg/dL (ref 8.9–10.3)
CALCIUM: 9.1 mg/dL (ref 8.9–10.3)
CO2: <7 mmol/L — ABNORMAL LOW (ref 22–32)
CO2: 12 mmol/L — ABNORMAL LOW (ref 22–32)
CO2: 12 mmol/L — ABNORMAL LOW (ref 22–32)
CREATININE: 1.62 mg/dL — AB (ref 0.61–1.24)
CREATININE: 1.43 mg/dL — AB (ref 0.61–1.24)
CREATININE: 0.88 mg/dL (ref 0.61–1.24)
Calcium: 8.4 mg/dL — ABNORMAL LOW (ref 8.9–10.3)
Chloride: 115 mmol/L — ABNORMAL HIGH (ref 101–111)
Chloride: 114 mmol/L — ABNORMAL HIGH (ref 101–111)
Chloride: 113 mmol/L — ABNORMAL HIGH (ref 101–111)
GFR calc Af Amer: >60 mL/min (ref >60)
GFR calc Af Amer: >60 mL/min (ref >60)
GFR calc Af Amer: >60 mL/min (ref >60)
GFR, EST NON AFRICAN AMERICAN: 58 mL/min — AB (ref >60)
GLUCOSE: 474 mg/dL — AB (ref 65–99)
GLUCOSE: 360 mg/dL — AB (ref 65–99)
GLUCOSE: 148 mg/dL — AB (ref 65–99)
Potassium: 5.1 mmol/L (ref 3.5–5.1)
Potassium: 4.7 mmol/L (ref 3.5–5.1)
Potassium: 4.1 mmol/L (ref 3.5–5.1)
SODIUM: 142 mmol/L (ref 135–145)
SODIUM: 142 mmol/L (ref 135–145)
SODIUM: 136 mmol/L (ref 135–145)

## 2018-03-07 LAB — CBG MONITORING, ED
GLUCOSE-CAPILLARY: 592 mg/dL — AB (ref 65–99)
GLUCOSE-CAPILLARY: 546 mg/dL — AB (ref 65–99)
GLUCOSE-CAPILLARY: 369 mg/dL — AB (ref 65–99)
GLUCOSE-CAPILLARY: 340 mg/dL — AB (ref 65–99)
Glucose-Capillary: >600 mg/dL (ref 65–99)
Glucose-Capillary: 490 mg/dL — ABNORMAL HIGH (ref 65–99)
Glucose-Capillary: 401 mg/dL — ABNORMAL HIGH (ref 65–99)
Glucose-Capillary: 178 mg/dL — ABNORMAL HIGH (ref 65–99)

## 2018-03-07 LAB — RAPID URINE DRUG SCREEN, HOSP PERFORMED
Amphetamines: NOT DETECTED
BARBITURATES: NOT DETECTED
Benzodiazepines: NOT DETECTED
Cocaine: NOT DETECTED
Opiates: NOT DETECTED
TETRAHYDROCANNABINOL: NOT DETECTED

## 2018-03-07 LAB — CBC WITH DIFFERENTIAL/PLATELET
Basophils Absolute: 0.1 10*3/uL (ref 0.0–0.1)
Basophils Relative: 0 %
Eosinophils Absolute: 0 10*3/uL (ref 0.0–0.7)
Eosinophils Relative: 0 %
HEMATOCRIT: 48.3 % (ref 39.0–52.0)
HEMOGLOBIN: 16.7 g/dL (ref 13.0–17.0)
LYMPHS ABS: 2.1 10*3/uL (ref 0.7–4.0)
LYMPHS PCT: 17 %
MCH: 32.4 pg (ref 26.0–34.0)
MCHC: 34.6 g/dL (ref 30.0–36.0)
MCV: 93.6 fL (ref 78.0–100.0)
MONOS PCT: 5 %
Monocytes Absolute: 0.7 10*3/uL (ref 0.1–1.0)
NEUTROS ABS: 9.6 10*3/uL — AB (ref 1.7–7.7)
NEUTROS PCT: 78 %
Platelets: 392 10*3/uL (ref 150–400)
RBC: 5.16 MIL/uL (ref 4.22–5.81)
RDW: 14 % (ref 11.5–15.5)
WBC: 12.5 10*3/uL — ABNORMAL HIGH (ref 4.0–10.5)

## 2018-03-07 LAB — I-STAT ARTERIAL BLOOD GAS, ED
ACID-BASE DEFICIT: 22 mmol/L — AB (ref 0.0–2.0)
Bicarbonate: 5.4 mmol/L — ABNORMAL LOW (ref 20.0–28.0)
O2 SAT: 89 %
PCO2 ART: 17.4 mmHg — AB (ref 32.0–48.0)
PH ART: 7.104 — AB (ref 7.350–7.450)
TCO2: 6 mmol/L — ABNORMAL LOW (ref 22–32)
pO2, Arterial: 74 mmHg — ABNORMAL LOW (ref 83.0–108.0)

## 2018-03-07 LAB — I-STAT TROPONIN, ED: Troponin i, poc: 0 ng/mL (ref 0.00–0.08)

## 2018-03-07 LAB — GLUCOSE, CAPILLARY
GLUCOSE-CAPILLARY: 122 mg/dL — AB (ref 65–99)
Glucose-Capillary: 134 mg/dL — ABNORMAL HIGH (ref 65–99)
Glucose-Capillary: 188 mg/dL — ABNORMAL HIGH (ref 65–99)

## 2018-03-07 MED ORDER — ENOXAPARIN SODIUM 40 MG/0.4ML ~~LOC~~ SOLN
40.0000 mg | SUBCUTANEOUS | Status: DC
  Administered 2018-03-07 – 2018-03-08 (×2): 40 mg via SUBCUTANEOUS
  Filled 2018-03-07 (×2): qty 0.4

## 2018-03-07 MED ORDER — SODIUM CHLORIDE 0.9 % IV SOLN
INTRAVENOUS | Status: DC
  Administered 2018-03-07: 11:00:00 via INTRAVENOUS

## 2018-03-07 MED ORDER — SODIUM CHLORIDE 0.45 % IV SOLN: INTRAVENOUS | Status: DC

## 2018-03-07 MED ORDER — SODIUM CHLORIDE 0.9 % IV BOLUS
1000.0000 mL | Freq: Once | INTRAVENOUS | Status: AC
  Administered 2018-03-07: 1000 mL via INTRAVENOUS

## 2018-03-07 MED ORDER — GI COCKTAIL ~~LOC~~
30.0000 mL | Freq: Three times a day (TID) | ORAL | Status: DC
  Administered 2018-03-07 – 2018-03-09 (×7): 30 mL via ORAL
  Filled 2018-03-07 (×7): qty 30

## 2018-03-07 MED ORDER — GI COCKTAIL ~~LOC~~
30.0000 mL | Freq: Once | ORAL | Status: AC
  Administered 2018-03-07: 30 mL via ORAL
  Filled 2018-03-07: qty 30

## 2018-03-07 MED ORDER — SODIUM CHLORIDE 0.9 % IV SOLN
INTRAVENOUS | Status: DC
  Administered 2018-03-07: 5.3 [IU]/h via INTRAVENOUS
  Filled 2018-03-07: qty 1

## 2018-03-07 MED ORDER — KETOROLAC TROMETHAMINE 30 MG/ML IJ SOLN: 30.0000 mg | Freq: Once | INTRAMUSCULAR | Status: DC

## 2018-03-07 MED ORDER — SODIUM CHLORIDE 0.9 % IV BOLUS
1000.0000 mL | Freq: Once | INTRAVENOUS | Status: AC
Start: 2018-03-07 — End: 2018-03-07
  Administered 2018-03-07: 1000 mL via INTRAVENOUS

## 2018-03-07 MED ORDER — DEXTROSE-NACL 5-0.45 % IV SOLN
INTRAVENOUS | Status: DC
  Administered 2018-03-07 – 2018-03-08 (×2): via INTRAVENOUS

## 2018-03-07 MED ORDER — FENTANYL CITRATE (PF) 100 MCG/2ML IJ SOLN: 50.0000 ug | Freq: Once | INTRAMUSCULAR | Status: DC

## 2018-03-07 MED ORDER — DEXTROSE-NACL 5-0.45 % IV SOLN: INTRAVENOUS | Status: DC

## 2018-03-07 MED ORDER — INSULIN REGULAR HUMAN 100 UNIT/ML IJ SOLN
INTRAMUSCULAR | Status: DC
  Administered 2018-03-07: 5.3 [IU]/h via INTRAVENOUS
  Filled 2018-03-07: qty 1

## 2018-03-07 MED ORDER — SODIUM CHLORIDE 0.9 % IV SOLN
INTRAVENOUS | Status: DC
  Administered 2018-03-07: 12:00:00 via INTRAVENOUS

## 2018-03-07 MED ORDER — FENTANYL CITRATE (PF) 100 MCG/2ML IJ SOLN
50.0000 ug | Freq: Once | INTRAMUSCULAR | Status: AC
Start: 2018-03-07 — End: 2018-03-07
  Administered 2018-03-07: 50 ug via INTRAVENOUS
  Filled 2018-03-07: qty 2

## 2018-03-07 NOTE — ED Notes (Signed)
Pt girlfriend reports that he is "ripping off all of his stickers and about to rip out his IV and wants to leave." Upon arrival to pt room pt is standing at the bedside pulling off ECG leads and stickers stating "man I'm going the fuck home I'm done being here." This nurse asked pt if there was anything that had happened that made him feel this way. To which pt reported that he "feels fine" and can take his own insulin at home. He states that he does not want to stay in his bed any more. This nurse offered to order him an inpatient bed to stay in while he remains in the ED waiting from a room upstairs. Pt refused and stated that it didn't matter he was "getting the fuck out of here any way." Pt educated about DKA and strongly urged to remain in the hospital for continued medical treatment. Answered all orientation questions appropriately.Pt verbalized understanding of the possibility of further injury or death. Stating "well when its my time its my time." this nurse asked if there was anything that we could do to make him stay to which he replied "No" Pt girlfriend remained in room to witness exchange, she called pt mom but pt refused to speak with her at that time. IVs removed per pt request. Pt ambulated gait steady off the unit to private vehicle.

## 2018-03-07 NOTE — ED Notes (Signed)
Paged admitting and told patient left AMA.

## 2018-03-07 NOTE — ED Notes (Signed)
Attempted to call report

## 2018-03-07 NOTE — ED Triage Notes (Signed)
Pt to ER returning to ER for evaluation of hyperglycemia, EMS reports CBG 510, pt also complaining of "heartburn." reports CP onset this morning. Reports nausea, no vomiting, and denies abdominal pain. Pt in NAD. A/ox4. Steady gait at bedside utilizing urinal. Output 500 clear urine.

## 2018-03-07 NOTE — ED Notes (Signed)
Assumed care on pt. , pt. Sleeping with no distress/respirations unlabored , Novolin R insulin drip infusing at 5.9 units/hr,D5%.45%NS IV infusing at 125 ml/hr .

## 2018-03-07 NOTE — H&P (Signed)
Date: 03/07/2018               Patient Name:  [name] MRN: [id number]  DOB: [birth year] Age / Sex: 27 y.o., male   PCP: Beola Cord, MD         Medical Service: Internal Medicine Teaching Service         Attending Physician: Dr. Burns Spain, MD    First Contact: Lowell Guitar, MS 4 Pager: [phone]  Second Contact: Dr. Nelson Chimes Pager: [phone]       After Hours (After 5p/  First Contact Pager: [phone]  weekends / holidays): Second Contact Pager: [phone]   Chief Complaint: Hyperglycemia and Heartburn  History of Present Illness: This is a 27 y.o. man with PMHx of insulin dependent DM since Sept 2018 with frequent episodes of DKA who presented to the ED last evening with DKA.  He was admitted at that time but ultimately left AMA before completing treatment.  He states that he went home and subsequently developed recurrence of his hyperglycemia and heartburn symptoms.  He also had nausea but no vomiting.  He did not take any insulin since leaving AMA last night.  Additionally, he reports polyuria, polydipsia, and dry mouth.  He denies any fevers or chills.  Upon presentation this morning to the ED, labs were consistent with DKA.  He was started on fluids and an insulin gtt.  [facility] was contacted to admit the patient.  Patient reports that he does need assistance acquiring his needed insulin and is willing to stay for admission today.  Labs: CMET - Na 137, K 5.5, Cl 103, Bicarb 7, Gluc 612, Creatinine 1.7, anion gap 27 CBC - WBC 12.5, Hgb 16, HCT 48, Platelets 392 UDS negative UA - straw colored, >500 glucose, small hemoglobin, 80 ketones, negative nitrites or leukocytes Troponin 0.00  Meds:  Insulin 70/30, 55 units BID Lisinopril  daily Metformin  BID  Allergies: Allergies as of 03/07/2018 - Review Complete 03/07/2018  Allergen Reaction Noted  . Vicodin [hydrocodone-acetaminophen] Anaphylaxis 01/22/2014  . Eggs or egg-derived products Nausea And Vomiting  07/25/2017  . Lactose intolerance (gi) Rash 01/22/2014   Past Medical History:  Diagnosis Date  . Asthma   . DKA (diabetic ketoacidoses) (HCC) 06/2017; 10/17/2017  . Hyperglycemia 09/24/2017  . Obesity   . Type II diabetes mellitus (HCC)    "dx'd 06/2017"    Family History: Mother with hx of DM at unknown age of onset  Social History: Lives locally with his girlfriend.  + Tobacco use and EtOH use.  Denies elicit drug use.   Review of Systems: A complete ROS was negative except as per HPI.   Physical Exam: Blood pressure (!) 121/58, pulse (!) 113, temperature (!) 97.5 F (36.4 C), temperature source Oral, resp. rate (!) 23, SpO2 100 %. Physical Exam  Constitutional: He is oriented to person, place, and time.  Lying in hospital stretcher, ill-appearing, drowsy but alert.  HENT:  Head: Normocephalic and atraumatic.  Dry mucus membranes  Eyes: Conjunctivae and EOM are normal.  Neck: Normal range of motion. Neck supple.  Cardiovascular: Regular rhythm and normal heart sounds.  Rate is tachycardic  Pulmonary/Chest: Effort normal and breath sounds normal. He has no wheezes. He has no rales.  Abdominal: Soft. Bowel sounds are normal. He exhibits no distension. There is tenderness. There is no rebound.  Mild generalized tenderness.   Musculoskeletal: He exhibits no edema.  Strength is intact with upper and lower extremities.   Neurological:  He is alert and oriented to person, place, and time. No cranial nerve deficit.  Skin: Skin is warm and dry.  Psychiatric: Mood and affect normal.     EKG: personally reviewed my interpretation is Sinus tach with rate 117  CXR: personally reviewed my interpretation is negative for acute pathology  Assessment & Plan by Problem: Principal Problem:   DKA (diabetic ketoacidoses) (HCC) Active Problems:   HTN (hypertension)  # DKA In the setting of insulin non-adherence.  Doubt infectious or ischemic etiology as the driver of his current DKA  episode.  His lab work and exam are consistent with moderate to severe DKA.  He has been started on IVF and an insulin gtt in the ED.  He will be admitted to the Step Down unit to continue this therapy.  His outpatient DM control is poor with consecutive A1c being greater than 14. - BMET Q4H to ensure anion gap is closing - IVF bolus done in the ED.  Continue NS IVF at 150cc/hr - Transition to D5-1/2NS when glucose less than 250 - Transition to subcutaneous insulin once gap is closed times 2 and patient able to take PO  # AKI Suspect pre-renal etiology in the setting of DKA and dehydration.  Expect improvement with aggressive rehydration - IVF - Follow BMET  # GERD Reports improvement with Fentanyl in the ED - Will schedule GI cocktails to see if this helps.  Suspect will improve as he is rehydrated  # HTN BP is stable this AM.  Will hold his lisinopril in the setting of AKI  # FEN Fluids: as above Electrolytes: monitor BMET Nutrition: NPO  # DVT PPx: Lovenox  # CODE: FULL   Dispo: Admit patient to Observation with expected length of stay less than 2 midnights.  SignedGwynn Burly, DO 03/07/2018, 11:27 AM  Pager: [phone]

## 2018-03-07 NOTE — ED Provider Notes (Signed)
[facility] EMERGENCY DEPARTMENT Provider Note   CSN: [id number] Arrival date & time: 03/07/18  0830     History   Chief Complaint Chief Complaint  Patient presents with  . Hyperglycemia    HPI [name] is a 27 y.o. male possible history of uncontrolled diabetes, CKD, asthma who presents for evaluation of hyperglycemia and chest pain.  Patient reports that he feels like his blood sugar is high that he states he has not checked it at home.  Patient reports that he developed some midsternal chest pain that began this morning.  He describes it as a burning sensation that radiates up his throat.  Patient states that his pain is worse when he moves and slightly worse when he takes a deep breath.  He denies any associated shortness of breath.  He reports that he has had similar pain before.  Patient also reports generalized abdominal pain.  Patient states that the last time he took any of his home diabetes medications was 2 days ago.  He was seen in the ED yesterday for evaluation of hyperglycemia and was found to be in DKA at that time.  Patient was admitted for further evaluation but left prior to completion of treatment.  Patient reports that he went home but did not take any of his medications.  Patient states he has had some nausea but denies any vomiting.  Patient denies any fevers, bilateral lower extremity swelling, long plane, car rides, train rides, recent surgeries, recent immobilizations.  Patient denies any personal cardiac history.  He denies any family cardiac history.  Patient reports he smokes 4 to 5 cigarettes a day.  Denies any cocaine, heroin, marijuana use.  The history is provided by the patient.    Past Medical History:  Diagnosis Date  . Asthma   . DKA (diabetic ketoacidoses) (HCC) 06/2017; 10/17/2017  . Hyperglycemia 09/24/2017  . Obesity   . Type II diabetes mellitus (HCC)    "dx'd 06/2017"    Patient Active Problem List   Diagnosis Date  Noted  . DKA (diabetic ketoacidoses) (HCC) 03/06/2018  . Right bundle branch block (RBBB) determined by electrocardiography 08/24/2017  . Type 2 diabetes mellitus (HCC) 08/01/2017  . Smoking 08/01/2017  . HTN (hypertension) 07/25/2017    Past Surgical History:  Procedure Laterality Date  . LACERATION REPAIR Left    "stitched finger up"  . TONSILLECTOMY          Home Medications    Prior to Admission medications   Medication Sig Start Date End Date Taking? Authorizing Provider  insulin aspart protamine- aspart (NOVOLOG MIX 70/30) (70-30) 100 UNIT/ML injection Inject 0.65 mLs (65 Units total) into the skin 2 (two) times daily with a meal. Patient taking differently: Inject 55 Units into the skin 2 (two) times daily with a meal.  01/01/18  Yes Beola Cord, MD  lisinopril (PRINIVIL,ZESTRIL) 40 MG tablet Take 1 tablet (40 mg total) by mouth daily. 01/01/18  Yes Beola Cord, MD  metFORMIN (GLUCOPHAGE) 1000 MG tablet Take 1 tablet (1,000 mg total) by mouth 2 (two) times daily with a meal. 01/01/18  Yes Beola Cord, MD    Family History History reviewed. No pertinent family history.  Social History Social History   Tobacco Use  . Smoking status: Current Every Day Smoker    Packs/day: 0.10    Years: 12.00    Pack years: 1.20    Types: Cigarettes  . Smokeless tobacco: Never Used  . Tobacco comment: cutting  back - now 1-2 cigarettes per day  Substance Use Topics  . Alcohol use: Yes    Comment: 10/17/2017 "2 shots of liquor/month; maybe"  . Drug use: Yes    Types: Marijuana    Comment: 10/17/2017 ~3 times per week     Allergies   Vicodin [hydrocodone-acetaminophen]; Eggs or egg-derived products; and Lactose intolerance (gi)   Review of Systems Review of Systems  Constitutional: Negative for chills and fever.  HENT: Negative for congestion.   Eyes: Negative for visual disturbance.  Respiratory: Negative for cough and shortness of breath.   Cardiovascular:  Positive for chest pain.  Gastrointestinal: Positive for abdominal pain and nausea. Negative for diarrhea and vomiting.  Genitourinary: Negative for dysuria and hematuria.  Musculoskeletal: Negative for back pain and neck pain.  Skin: Negative for rash.  Neurological: Negative for dizziness, weakness, numbness and headaches.  Psychiatric/Behavioral: Negative for confusion.  All other systems reviewed and are negative.    Physical Exam Updated Vital Signs BP (!) 121/58   Pulse (!) 113   Temp (!) 97.5 F (36.4 C) (Oral)   Resp (!) 23   SpO2 100%   Physical Exam  Constitutional: He is oriented to person, place, and time. He appears well-developed and well-nourished.  HENT:  Head: Normocephalic and atraumatic.  Mouth/Throat: Oropharynx is clear and moist and mucous membranes are normal.  Eyes: Pupils are equal, round, and reactive to light. Conjunctivae, EOM and lids are normal.  Neck: Full passive range of motion without pain.  Cardiovascular: Normal rate, regular rhythm and normal heart sounds. Exam reveals no gallop and no friction rub.  No murmur heard. Pulses:      Radial pulses are 2+ on the right side, and 2+ on the left side.       Dorsalis pedis pulses are 2+ on the right side, and 2+ on the left side.  Pulmonary/Chest: Effort normal and breath sounds normal.  Lungs clear to auscultation bilaterally.  Symmetric chest rise.  No wheezing, rales, rhonchi.  Abdominal: Soft. Normal appearance. There is generalized tenderness. There is no rigidity and no guarding.  Abdomen is soft, nondistended.  Diffuse generalized tenderness with no focal point.  Musculoskeletal: Normal range of motion.  Bilateral lower extremities are symmetric in appearance.  No overlying warmth, erythema.  Neurological: He is alert and oriented to person, place, and time.  Skin: Skin is warm and dry. Capillary refill takes less than 2 seconds.  Psychiatric: He has a normal mood and affect. His speech is  normal.  Nursing note and vitals reviewed.    ED Treatments / Results  Labs (all labs ordered are listed, but only abnormal results are displayed) Labs Reviewed  COMPREHENSIVE METABOLIC PANEL - Abnormal; Notable for the following components:      Result Value   Potassium 5.5 (*)    CO2 7 (*)    Glucose, Bld 612 (*)    Creatinine, Ser 1.75 (*)    AST 12 (*)    ALT 12 (*)    Total Bilirubin 1.9 (*)    GFR calc non Af Amer 53 (*)    Anion gap 27 (*)    All other components within normal limits  CBC WITH DIFFERENTIAL/PLATELET - Abnormal; Notable for the following components:   WBC 12.5 (*)    Neutro Abs 9.6 (*)    All other components within normal limits  URINALYSIS, ROUTINE W REFLEX MICROSCOPIC - Abnormal; Notable for the following components:   Color, Urine STRAW (*)  Glucose, UA >=500 (*)    Hgb urine dipstick SMALL (*)    Ketones, ur 80 (*)    All other components within normal limits  CBG MONITORING, ED - Abnormal; Notable for the following components:   Glucose-Capillary >600 (*)    All other components within normal limits  CBG MONITORING, ED - Abnormal; Notable for the following components:   Glucose-Capillary 592 (*)    All other components within normal limits  CBG MONITORING, ED - Abnormal; Notable for the following components:   Glucose-Capillary 546 (*)    All other components within normal limits  CBG MONITORING, ED - Abnormal; Notable for the following components:   Glucose-Capillary 490 (*)    All other components within normal limits  RAPID URINE DRUG SCREEN, HOSP PERFORMED  BASIC METABOLIC PANEL  BASIC METABOLIC PANEL  BASIC METABOLIC PANEL  BASIC METABOLIC PANEL  I-STAT TROPONIN, ED    EKG EKG Interpretation  Date/Time:  Thursday Mar 07 2018 08:40:23 EDT Ventricular Rate:  117 PR Interval:    QRS Duration: 132 QT Interval:  363 QTC Calculation: 507 R Axis:   -76 Text Interpretation:  Sinus tachycardia T waves improving from last EKG.   Confirmed by Bary Castilla ([id number]) on 03/07/2018 8:50:14 AM Also confirmed by Bary Castilla ([id number]), editor Sheppard Evens [phone])  on 03/07/2018 12:09:01 PM   Radiology Dg Chest 2 View  Result Date: 03/07/2018 CLINICAL DATA:  Chest pain EXAM: CHEST - 2 VIEW COMPARISON:  09/23/2017 FINDINGS: The heart size and mediastinal contours are within normal limits. Both lungs are clear. The visualized skeletal structures are unremarkable. IMPRESSION: No active cardiopulmonary disease. Electronically Signed   By: Alcide Clever M.D.   On: 03/07/2018 09:51    Procedures .Critical Care Performed by: Maxwell Caul, PA-C Authorized by: Maxwell Caul, PA-C   Critical care provider statement:    Critical care time (minutes):  35   Critical care time was exclusive of:  Separately billable procedures and treating other patients   Critical care was necessary to treat or prevent imminent or life-threatening deterioration of the following conditions:  Cardiac failure, renal failure, respiratory failure, circulatory failure and sepsis   Critical care was time spent personally by me on the following activities:  Blood draw for specimens, ordering and performing treatments and interventions, ordering and review of laboratory studies, development of treatment plan with patient or surrogate, ordering and review of radiographic studies and pulse oximetry   (including critical care time)  Medications Ordered in ED Medications  0.9 %  sodium chloride infusion ( Intravenous New Bag/Given 03/07/18 1146)  dextrose 5 %-0.45 % sodium chloride infusion ( Intravenous Hold 03/07/18 1130)  insulin regular (NOVOLIN R,HUMULIN R) 100 Units in sodium chloride 0.9 % 100 mL (1 Units/mL) infusion (12.9 Units/hr Intravenous Rate/Dose Change 03/07/18 1327)  enoxaparin (LOVENOX) injection 40 mg (has no administration in time range)  gi cocktail (Maalox,Lidocaine,Donnatal) (30 mLs Oral Given 03/07/18 1152)  gi cocktail  (Maalox,Lidocaine,Donnatal) (30 mLs Oral Given 03/07/18 0903)  sodium chloride 0.9 % bolus 1,000 mL (0 mLs Intravenous Stopped 03/07/18 1016)  sodium chloride 0.9 % bolus 1,000 mL (0 mLs Intravenous Stopped 03/07/18 1145)  fentaNYL (SUBLIMAZE) injection 50 mcg (50 mcg Intravenous Given 03/07/18 1038)     Initial Impression / Assessment and Plan / ED Course  I have reviewed the triage vital signs and the nursing notes.  Pertinent labs & imaging results that were available during my care of the patient were reviewed by  me and considered in my medical decision making (see chart for details).     27 year old male with possible history of uncontrolled diabetes, DKA who presents for evaluation of hyperglycemia, chest pain.  Was seen in the ED yesterday and was admitted for DKA.  Patient left prior to completion of treatment.  Reports generalized abdominal pain and nausea.  No fevers, vomiting. Patient is afebrile, non-toxic appearing, sitting comfortably on examination table. Vital signs reviewed and stable.  On exam, lungs clear to auscultation.  Abdomen with generalized tenderness.  Bilateral lower extremities are symmetric in appearance.  Consider infectious etiology versus DKA versus ACS etiology. Doubt PE. Plan check basic labs, chest x-ray, EKG.  Fluids started in the ED.  I-STAT troponin negative.  CBG is greater than 600.  UA shows ketones.  Urine drug screen negative.  CMP shows potassium 5.5.  Glucose is 612.  Bicarb is 7.  Creatinine is 1.75.  Anion gap is 27.  Total bili is slightly elevated 1.9.  CBC with leukocytosis of 12.5.  Chest x-ray negative for any acute infectious etiology.  Labs consistent with DKA.  Suspect this is from medication noncompliance rather than infectious etiology.  I discussed results with patient.  He is agreeable to admission.  We will plan to consult internal medicine for admission.  Patient states GI cocktail helped his chest pain slightly but still having pain.  Will plan  to give additional analgesics.  Discussed with internal medicine team.  Agree for admission.  Final Clinical Impressions(s) / ED Diagnoses   Final diagnoses:  Diabetic ketoacidosis without coma associated with type 2 diabetes mellitus [facility])    ED Discharge Orders    None       Maxwell Caul, PA-C 03/07/18 1412    Abelino Derrick, MD 03/08/18 [phone]

## 2018-03-08 ENCOUNTER — Other Ambulatory Visit: Payer: Self-pay

## 2018-03-08 ENCOUNTER — Encounter (HOSPITAL_COMMUNITY): Payer: Self-pay | Admitting: General Practice

## 2018-03-08 DIAGNOSIS — Z9114 Patient's other noncompliance with medication regimen: Secondary | ICD-10-CM

## 2018-03-08 LAB — GLUCOSE, CAPILLARY
GLUCOSE-CAPILLARY: 148 mg/dL — AB (ref 65–99)
GLUCOSE-CAPILLARY: 179 mg/dL — AB (ref 65–99)
GLUCOSE-CAPILLARY: 192 mg/dL — AB (ref 65–99)
GLUCOSE-CAPILLARY: 388 mg/dL — AB (ref 65–99)
Glucose-Capillary: 138 mg/dL — ABNORMAL HIGH (ref 65–99)
Glucose-Capillary: 129 mg/dL — ABNORMAL HIGH (ref 65–99)
Glucose-Capillary: 175 mg/dL — ABNORMAL HIGH (ref 65–99)
Glucose-Capillary: 153 mg/dL — ABNORMAL HIGH (ref 65–99)
Glucose-Capillary: 184 mg/dL — ABNORMAL HIGH (ref 65–99)
Glucose-Capillary: 183 mg/dL — ABNORMAL HIGH (ref 65–99)
Glucose-Capillary: 266 mg/dL — ABNORMAL HIGH (ref 65–99)
Glucose-Capillary: 390 mg/dL — ABNORMAL HIGH (ref 65–99)

## 2018-03-08 LAB — BASIC METABOLIC PANEL
Anion gap: 9 (ref 5–15)
Anion gap: 8 (ref 5–15)
Anion gap: 8 (ref 5–15)
Anion gap: 10 (ref 5–15)
BUN: 6 mg/dL (ref 6–20)
BUN: 6 mg/dL (ref 6–20)
BUN: 6 mg/dL (ref 6–20)
CALCIUM: 8.3 mg/dL — AB (ref 8.9–10.3)
CO2: 15 mmol/L — ABNORMAL LOW (ref 22–32)
CO2: 16 mmol/L — AB (ref 22–32)
CO2: 15 mmol/L — ABNORMAL LOW (ref 22–32)
CO2: 16 mmol/L — ABNORMAL LOW (ref 22–32)
CREATININE: 0.8 mg/dL (ref 0.61–1.24)
CREATININE: 0.8 mg/dL (ref 0.61–1.24)
Calcium: 8.5 mg/dL — ABNORMAL LOW (ref 8.9–10.3)
Calcium: 8.6 mg/dL — ABNORMAL LOW (ref 8.9–10.3)
Calcium: 8.3 mg/dL — ABNORMAL LOW (ref 8.9–10.3)
Chloride: 115 mmol/L — ABNORMAL HIGH (ref 101–111)
Chloride: 112 mmol/L — ABNORMAL HIGH (ref 101–111)
Chloride: 110 mmol/L (ref 101–111)
Chloride: 107 mmol/L (ref 101–111)
Creatinine, Ser: 0.79 mg/dL (ref 0.61–1.24)
Creatinine, Ser: 0.99 mg/dL (ref 0.61–1.24)
GFR calc Af Amer: >60 mL/min (ref >60)
GFR calc Af Amer: >60 mL/min (ref >60)
GFR calc non Af Amer: >60 mL/min (ref >60)
GFR calc non Af Amer: >60 mL/min (ref >60)
GFR calc non Af Amer: >60 mL/min (ref >60)
GLUCOSE: 141 mg/dL — AB (ref 65–99)
Glucose, Bld: 135 mg/dL — ABNORMAL HIGH (ref 65–99)
Glucose, Bld: 289 mg/dL — ABNORMAL HIGH (ref 65–99)
Glucose, Bld: 397 mg/dL — ABNORMAL HIGH (ref 65–99)
Potassium: 3.5 mmol/L (ref 3.5–5.1)
Potassium: 3.6 mmol/L (ref 3.5–5.1)
Potassium: 3.5 mmol/L (ref 3.5–5.1)
Potassium: 4.1 mmol/L (ref 3.5–5.1)
SODIUM: 133 mmol/L — AB (ref 135–145)
Sodium: 139 mmol/L (ref 135–145)
Sodium: 136 mmol/L (ref 135–145)
Sodium: 133 mmol/L — ABNORMAL LOW (ref 135–145)

## 2018-03-08 MED ORDER — SODIUM CHLORIDE 0.9 % IV SOLN
INTRAVENOUS | Status: DC
  Administered 2018-03-08 – 2018-03-09 (×2): via INTRAVENOUS

## 2018-03-08 MED ORDER — INSULIN ASPART 100 UNIT/ML ~~LOC~~ SOLN: 0.0000 [IU] | Freq: Three times a day (TID) | SUBCUTANEOUS | Status: DC

## 2018-03-08 MED ORDER — INSULIN GLARGINE 100 UNIT/ML ~~LOC~~ SOLN
40.0000 [IU] | Freq: Every day | SUBCUTANEOUS | Status: DC
  Administered 2018-03-08: 40 [IU] via SUBCUTANEOUS
  Filled 2018-03-08 (×2): qty 0.4

## 2018-03-08 MED ORDER — INSULIN ASPART 100 UNIT/ML ~~LOC~~ SOLN
0.0000 [IU] | Freq: Three times a day (TID) | SUBCUTANEOUS | Status: DC
  Administered 2018-03-08: 15 [IU] via SUBCUTANEOUS
  Administered 2018-03-08: 8 [IU] via SUBCUTANEOUS
  Administered 2018-03-08: 3 [IU] via SUBCUTANEOUS

## 2018-03-08 MED ORDER — INSULIN ASPART PROT & ASPART (70-30 MIX) 100 UNIT/ML ~~LOC~~ SUSP
65.0000 [IU] | Freq: Two times a day (BID) | SUBCUTANEOUS | Status: DC
  Filled 2018-03-08: qty 10

## 2018-03-08 MED ORDER — SODIUM CHLORIDE 0.9 % IV BOLUS
1000.0000 mL | INTRAVENOUS | Status: AC
  Administered 2018-03-08: 1000 mL via INTRAVENOUS

## 2018-03-08 MED ORDER — LISINOPRIL 40 MG PO TABS
40.0000 mg | ORAL_TABLET | Freq: Every day | ORAL | Status: DC
  Administered 2018-03-08 – 2018-03-09 (×2): 40 mg via ORAL
  Filled 2018-03-08 (×2): qty 1

## 2018-03-08 MED ORDER — INSULIN ASPART 100 UNIT/ML ~~LOC~~ SOLN
0.0000 [IU] | Freq: Every day | SUBCUTANEOUS | Status: DC
  Administered 2018-03-08: 5 [IU] via SUBCUTANEOUS

## 2018-03-08 MED ORDER — HYDROCORTISONE 1 % EX CREA
TOPICAL_CREAM | Freq: Two times a day (BID) | CUTANEOUS | Status: DC
  Administered 2018-03-08 – 2018-03-09 (×2): via TOPICAL
  Filled 2018-03-08: qty 28

## 2018-03-08 NOTE — Progress Notes (Signed)
Notified on-call physician regarding blood sugar reading of 390 tonight however there is no order for hour of sleep coverage. Will continue to monitor.

## 2018-03-08 NOTE — Progress Notes (Signed)
  Date: 03/08/2018  Patient name: Jahden Schara  Medical record number: [id number]  Date of birth: 08/26/1991   I have seen and evaluated [name] and discussed their care with the Residency Team. Mr Missey is a 27 yo man with DM fx in Sep 2018 with HHS / DKA. He has had issues managing his DM and HTN as an outpt and has been admitted 4 more times since then all for HHS / DKA. He states he is at work when his second 70/30 injection is due. He uses insulin vials. He has no insight into his barriers and his plan to improve his Dm and HTN is to just take the meds. Now off IVF, on subQ insulin, taking PO.   PMHx, Fam Hx, and/or Soc Hx : Mother has DM. Sig other is supportive. He works in Hospital doctor  Afebrile HR 81 RR 20 126/81 100% sat on RA Gen NAD, sleepy, not too interactive   Bicarb <7 - 16 - now 15 CBG 289 A1C > 14  I personally viewed the CXR images and confirmed my reading with the official read. PA and lateral - high quality, no abnl   I personally viewed the EKG and confirmed my reading with the official read. Sinus tach, LAD, no ischemic changes  Assessment and Plan: I have seen and evaluated the patient as outlined above. I agree with the formulated Assessment and Plan as detailed in the residents' note, with the following changes: Mr Bulthuis is a 27 yo man with newlt dx and poorly controlled DM presenting in DKA. He was admitted yesterday but left AMA while still in DKA. Until he takes ownership of his dx and tx, he will cont to have poorly controlled Dm and HTN. We are going to change to 70/30 pens to make admin easier, esp at work. We will use the $4 [facility] outpt pharmacy to reduce cost. He no showed DM education in Oct but we will re-arrange. We will cont support and education in [facility].   His gap is closed but CO2 16, now 15. He has received a total of 6.5 L. I am concerned that bicarb is not normal and is down trending although the 16 to 15 could just be lab variation.  Will give another litre IVF and recheck BMP.  1. DKA - another litre NS then check bicarb  2. Poorly controlled DM II, ketosis prone - he had neg GAD Ab in 06/2016, making type I less likely (tesing for other ab would increase spec). I agree that 70/30 insulin is not the best regimen but the best regimen is the one that he will take routinely. 70/30 will require 2 injections. I am concerned about changing him to lantus bc at this time, he has shown no willingness to routinely take 2 injections a day let alone multiple injections for basal / bolus. I am afraid if placed on lantus, the dose would be titrated up bc that is easier than adding pre-prandial until he bc hypoglycemic rather than adding prandial insulin. Once he shows compliance with 70/30, then can talk about changing to basal / bolus.   Burns Spain, MD 5/10/20192:10 PM

## 2018-03-08 NOTE — Progress Notes (Signed)
Enrique Sack, Diabetes coordinator in seeing patient at this time.

## 2018-03-08 NOTE — Progress Notes (Signed)
Per Dr. Nelson Chimes, stop insulin drip at this time.

## 2018-03-08 NOTE — Discharge Summary (Signed)
Name: [name] MRN: [id number] DOB: [birth year] 27 y.o. PCP: Beola Cord, MD  Date of Admission: 03/07/2018  8:30 AM Date of Discharge: 03/09/18 Attending Physician: No att. providers found  Discharge Diagnosis: Diabetic Ketoacidosis  Principal Problem:   DKA (diabetic ketoacidoses) (HCC) Active Problems:   HTN (hypertension)   Discharge Medications: Allergies as of 03/09/2018      Reactions   Vicodin [hydrocodone-acetaminophen] Anaphylaxis   Tolerates tylenol with no allergy.   Eggs Or Egg-derived Products Nausea And Vomiting   Lactose Intolerance (gi) Rash      Medication List    STOP taking these medications   insulin aspart protamine- aspart (70-30) 100 UNIT/ML injection Commonly known as:  NOVOLOG MIX 70/30 Replaced by:  insulin aspart protamine - aspart (70-30) 100 UNIT/ML FlexPen     TAKE these medications   insulin aspart protamine - aspart (70-30) 100 UNIT/ML FlexPen Commonly known as:  NOVOLOG MIX 70/30 FLEXPEN Inject 0.65 mLs (65 Units total) into the skin 2 (two) times daily. IM Program Replaces:  insulin aspart protamine- aspart (70-30) 100 UNIT/ML injection   Insulin Pen Needle 32G X 8 MM Misc 65 Units by Does not apply route 2 (two) times daily. IM Program   lisinopril 40 MG tablet Commonly known as:  PRINIVIL,ZESTRIL Take 1 tablet (40 mg total) by mouth daily. IM Program What changed:  additional instructions   metFORMIN 1000 MG tablet Commonly known as:  GLUCOPHAGE Take 1 tablet (1,000 mg total) by mouth 2 (two) times daily with a meal.       Disposition and follow-up:   [name] was discharged from [facility] in Stable condition.  At the hospital follow up visit please address:  1. A. Medication adherence: 65u 70/30 BID, flexpens vs. vials B. Glucometer test strips C. Medicaid Application  2.  Labs / imaging needed at time of follow-up: BMP  3.  Pending labs/ test needing follow-up:  none  Follow-up Appointments: Follow-up Information    Beola Cord, MD. Go on 03/15/2018.   Specialty:  Internal Medicine Why:  10:45 AM Contact information: [address] [place] [address] [phone]           Hospital Course by problem list: Principal Problem:   DKA (diabetic ketoacidoses) (HCC) Active Problems:   HTN (hypertension)   [name] is a 27 yoM with a PMHx of T2DM (dx'd in 06/2017, DKA admissions x5) who returned to the ED for treatment of diabetic ketoacidosis on 03/07/18.  # DKA:  [name] was seen for the same in the [facility] Emergency Department on 03/06/2018 but left AMA before his anion gap closed. He returned to the ED on 5/9 with intractable vomiting and abdominal pain in the setting of continued insulin non-adherence. His lab work and exam were consistent with moderate to severe DKA and he was acidotic with pH 7.1 and mild hypernatremia. After receiving aggressive IVF resuscitation and an insulin drip, he was stabilized and admitted to the step down unit. Continue metabolic panels continued to normalize and his anion gap closed the morning of 5/10. His abdominal pain resolved with GI cocktails and hydration. At this time he was permitted to eat and drink by mouth & receive SQ insulin, but his bicarbonate remained slow to reach nadir. An additional fluid bolus was given on the afternoon of discharge and his bicarbonate was 19.  Obstacles to insulin adherence were discussed at length. He reports that his work as in [employer] makes it difficult  for him to take his morning dose. Until this time, he had been drawing up his own insulin. The decision was made to facilitate administration by switching to insulin pens at discharge. He will return to his home dose of 65u 70/30 BID along with metformin  BID.  # AKI, resolved: [name]' Cr was elevated to 1.62 on arrival. It returned to baseline (0.80) with aggressive rehydration, suggesting a prerenal etiology  in the setting of DKA and dehydration.   # HTN: Blood pressures were elevated overnight, but lisinopril was intermittently discontinued at due to increased Cr. Resumed after AKI resolved. BP remained within goal afterwards.   Discharge Vitals:   BP 117/68 (BP Location: Right Arm)   Pulse 80   Temp 99 F (37.2 C) (Oral)   Resp (!) 24   Ht  (1.676 m) Comment: Simultaneous filing. User may not have seen previous data.  Wt 217 lb 4.8 oz (98.6 kg) Comment: Simultaneous filing. User may not have seen previous data.  SpO2 99%   BMI 35.07 kg/m   Pertinent Labs, Studies, and Procedures:  BMP Latest Ref Rng & Units 03/09/2018 03/09/2018 03/08/2018  Glucose 65 - 99 mg/dL 161(W) 960(A) 540(J)  BUN 6 - 20 mg/dL Creatinine 0.61 - 1.24 mg/dL 8.11 9.14 7.82  Sodium 135 - 145 mmol/L 134(L) 137 133(L)  Potassium 3.5 - 5.1 mmol/L 3.2(L) 3.5 4.1  Chloride 101 - 111 mmol/L 106 110 107  CO2 22 - 32 mmol/L 19(L) 18(L) 16(L)  Calcium 8.9 - 10.3 mg/dL 8.0(L) 8.2(L) 8.3(L)   BMP Latest Ref Rng & Units 03/09/2018 03/09/2018 03/08/2018  Glucose 65 - 99 mg/dL 956(O) 130(Q) 657(Q)  BUN 6 - 20 mg/dL Creatinine 0.61 - 1.24 mg/dL 4.69 6.29 5.28  Sodium 135 - 145 mmol/L 134(L) 137 133(L)  Potassium 3.5 - 5.1 mmol/L 3.2(L) 3.5 4.1  Chloride 101 - 111 mmol/L 106 110 107  CO2 22 - 32 mmol/L 19(L) 18(L) 16(L)  Calcium 8.9 - 10.3 mg/dL 8.0(L) 8.2(L) 8.3(L)    Discharge Instructions: Discharge Instructions    Diet - low sodium heart healthy   Complete by:  As directed    Discharge instructions   Complete by:  As directed    It was pleasure taking care of you. As we discussed it is very important that you take your insulin as directed.  Do not miss any dose.  I am giving you a prescription of flexi pen, you can keep one pen in your pocket so you will not miss any dose if you are outside of your home. Continue taking your Metformin and lisinopril. I sent all new prescriptions to [facility]  outpatient pharmacy with our clinic program.  You can get them for $4 each. It is also very important that you follow-up in clinic very regularly. Please check your blood sugar at home at least 3-4 times a day and bring your glucometer to your follow-up appointment for download.  Your insulin dose can be adjusted in the clinic.   Increase activity slowly   Complete by:  As directed       Signed: Arnetha Courser, MD 03/10/2018, 12:08 AM   Pager: [phone]

## 2018-03-08 NOTE — Progress Notes (Signed)
Subjective: [name] was resting comfortably in bed with no complaints during rounds. He is tolerating fluids and food by mouth.  He seemed to understand what it will take to stay out of the hospital and prevent future admissions with regard to his diabetes management. He denies difficulty paying for medications on the $4 list. He states that his reflux/nausea/abdominal pain has improved with GI cocktail and fluid resuscitation.  Objective:  Vital signs in last 24 hours: Vitals:   03/07/18 1915 03/07/18 2019 03/07/18 2344 03/08/18 0312  BP: (!) 103/48 132/73 126/73 122/74  Pulse: (!) 118 (!) 107 (!) 105   Resp: (!) 21 (!) 22 (!) 27   Temp:  98.2 F (36.8 C) 98.9 F (37.2 C) 98.4 F (36.9 C)  TempSrc:  Oral Oral Oral  SpO2: 100% 100% 100%   Weight:  217 lb 4.8 oz (98.6 kg)    Height:   (1.676 m)     Physical Exam  Constitutional: He is oriented to person, place, and time. He appears well-developed and well-nourished.  HENT:  Head: Normocephalic and atraumatic.  Eyes: Pupils are equal, round, and reactive to light.  Cardiovascular: Normal rate and regular rhythm.  Pulmonary/Chest: Effort normal and breath sounds normal.  Abdominal: Soft. Bowel sounds are normal.  Neurological: He is alert and oriented to person, place, and time.  Skin: Skin is warm and dry. He is not diaphoretic.  Psychiatric: He has a normal mood and affect. His behavior is normal.   Lab Results  Component Value Date   WBC 12.5 (H) 03/07/2018   HGB 16.7 03/07/2018   HCT 48.3 03/07/2018   MCV 93.6 03/07/2018   PLT 392 03/07/2018   Urinalysis    Component Value Date/Time   COLORURINE STRAW (A) 03/07/2018 0840   APPEARANCEUR CLEAR 03/07/2018 0840   LABSPEC 1.016 03/07/2018 0840   PHURINE 5.0 03/07/2018 0840   GLUCOSEU >=500 (A) 03/07/2018 0840   HGBUR SMALL (A) 03/07/2018 0840   BILIRUBINUR NEGATIVE 03/07/2018 0840   KETONESUR 80 (A) 03/07/2018 0840   PROTEINUR NEGATIVE 03/07/2018 0840   NITRITE NEGATIVE 03/07/2018 0840   LEUKOCYTESUR NEGATIVE 03/07/2018 0840    BMP Latest Ref Rng & Units 03/08/2018 03/08/2018 03/07/2018  Glucose 65 - 99 mg/dL 161(W) 960(A) 540(J)  BUN 6 - 20 mg/dL Creatinine 0.61 - 1.24 mg/dL 8.11 9.14 7.82  Sodium 135 - 145 mmol/L 136 139 136  Potassium 3.5 - 5.1 mmol/L 3.6 3.5 4.1  Chloride 101 - 111 mmol/L 112(H) 115(H) 113(H)  CO2 22 - 32 mmol/L 16(L) 15(L) 12(L)  Calcium 8.9 - 10.3 mg/dL 9.5(A) 2.1(H) 0.8(M)    Assessment/Plan:  Principal Problem:   DKA (diabetic ketoacidoses) (HCC) Active Problems:   HTN (hypertension)  # DKA In the setting of insulin non-adherence.  Doubt infectious or ischemic etiology as the driver of his current DKA episode.  His lab work and exam are consistent with moderate to severe DKA.  He has been started on IVF and an insulin gtt in the ED.  He will be admitted to the Step Down unit to continue this therapy.  His outpatient DM control is poor with consecutive A1c being greater than 14. - s/p aggressive IVF - Anion Gap closed x2 BMP; repeat BMP prior to dc - Transition to subcutaneous insulin now that gap is closed; patient able to take PO - Switch to insulin pens at dc; needs more test strips (uses [facility] glucometer) - Restart home metformin   BID at dc  # AKI, resolved Suspect pre-renal etiology in the setting of DKA and dehydration.  Improved with aggressive rehydration. Cr back to 0.80 from 1.62.   # GERD Reports improvement with Fentanyl in the ED. No complaints with hydration and GI cocktails.  # HTN BP elevated at approximately 6 PM last night. Restart lisinopril in setting of resolved AKI.  # FEN Fluids: as above Electrolytes: monitor BMET Nutrition: NPO  # DVT PPx: Lovenox  Dispo: Anticipated discharge in approximately 0 day(s).   Dow Adolph, Medical Student 03/08/2018, 8:14 AM Pager: [phone]  Attestation for Student Documentation:  I personally was present and  performed or re-performed the history, physical exam and medical decision-making activities of this service and have verified that the service and findings are accurately documented in the student's note.  Arnetha Courser, MD 03/08/2018, 1:14 PM

## 2018-03-08 NOTE — Progress Notes (Addendum)
Spoke with patient about his diabetes and his insulin. States that his insulin is very expensive and cannot afford it. Told him that he needs to take his insulin everyday as prescribed and be sure to follow up with physician in the clinic to prevent having to come to the hospital so frequently. Our Inpatient Diabetes Program team has seen him on several occasions with previous admissions.  Recommend that patient get 70/30 insulin from the Internal Medicine clinic for $4 if available. Needs to have insulin for the weekend.   For a backup choice, recommend the Novolin Relion 70/30 insulin pens  (Order # [id number]) for 1 box of 5 pens for $42.88 and insulin pen needles (order # [id number]); or Novolin Relion 70/30 insulin vial suspension (order # [id number]) for $25 per bottle and syringes (order # [id number])  The Novolin Relion insulin pens are very new to [employer], so not all of the stores may have them. Spoke with Dr. Lowell Guitar about these choices.   Smith Mince RN BSN CDE Diabetes Coordinator Pager: [phone]  8am-5pm

## 2018-03-08 NOTE — Progress Notes (Signed)
Page put in to diabetes coordinator per MD order. Patient needs education. Admitted due to DKA.  Ernestina Columbia, RN

## 2018-03-08 NOTE — Progress Notes (Addendum)
Patient complained about rectal pain, mostly with a bowel movement. States pain comes and goes. No bleeding per patient. Upon assessment, pt appeared to maybe have a few small hemorrhoids. MD notified at this time.   Ernestina Columbia, RN

## 2018-03-08 NOTE — Progress Notes (Signed)
Financial services in to see patient at this time.

## 2018-03-09 LAB — BASIC METABOLIC PANEL
Anion gap: 9 (ref 5–15)
Anion gap: 9 (ref 5–15)
BUN: 6 mg/dL (ref 6–20)
BUN: 6 mg/dL (ref 6–20)
CO2: 18 mmol/L — ABNORMAL LOW (ref 22–32)
CO2: 19 mmol/L — AB (ref 22–32)
Calcium: 8.2 mg/dL — ABNORMAL LOW (ref 8.9–10.3)
Calcium: 8 mg/dL — ABNORMAL LOW (ref 8.9–10.3)
Chloride: 110 mmol/L (ref 101–111)
Chloride: 106 mmol/L (ref 101–111)
Creatinine, Ser: 0.82 mg/dL (ref 0.61–1.24)
Creatinine, Ser: 0.7 mg/dL (ref 0.61–1.24)
GFR calc Af Amer: >60 mL/min (ref >60)
GFR calc Af Amer: >60 mL/min (ref >60)
GLUCOSE: 321 mg/dL — AB (ref 65–99)
GLUCOSE: 306 mg/dL — AB (ref 65–99)
POTASSIUM: 3.2 mmol/L — AB (ref 3.5–5.1)
Potassium: 3.5 mmol/L (ref 3.5–5.1)
SODIUM: 137 mmol/L (ref 135–145)
Sodium: 134 mmol/L — ABNORMAL LOW (ref 135–145)

## 2018-03-09 LAB — GLUCOSE, CAPILLARY
Glucose-Capillary: 341 mg/dL — ABNORMAL HIGH (ref 65–99)
Glucose-Capillary: 287 mg/dL — ABNORMAL HIGH (ref 65–99)

## 2018-03-09 MED ORDER — INSULIN ASPART PROT & ASPART (70-30 MIX) 100 UNIT/ML PEN: 65.0000 [IU] | PEN_INJECTOR | Freq: Two times a day (BID) | SUBCUTANEOUS | 11 refills | Status: DC

## 2018-03-09 MED ORDER — INSULIN PEN NEEDLE 32G X 8 MM MISC: 65.0000 [IU] | Freq: Two times a day (BID) | 3 refills | Status: DC

## 2018-03-09 MED ORDER — INSULIN GLARGINE 100 UNIT/ML ~~LOC~~ SOLN
60.0000 [IU] | Freq: Every day | SUBCUTANEOUS | Status: DC
  Administered 2018-03-09: 60 [IU] via SUBCUTANEOUS
  Filled 2018-03-09: qty 0.6

## 2018-03-09 MED ORDER — SODIUM CHLORIDE 0.9 % IV BOLUS
1000.0000 mL | Freq: Once | INTRAVENOUS | Status: AC
  Administered 2018-03-09: 1000 mL via INTRAVENOUS

## 2018-03-09 MED ORDER — LISINOPRIL 40 MG PO TABS: 40.0000 mg | ORAL_TABLET | Freq: Every day | ORAL | 3 refills | Status: DC

## 2018-03-09 MED ORDER — INSULIN ASPART 100 UNIT/ML ~~LOC~~ SOLN
0.0000 [IU] | Freq: Three times a day (TID) | SUBCUTANEOUS | Status: DC
  Administered 2018-03-09: 11 [IU] via SUBCUTANEOUS
  Administered 2018-03-09: 4 [IU] via SUBCUTANEOUS

## 2018-03-09 NOTE — Progress Notes (Signed)
   Subjective: Pt. Was feeling better when seen this morning.Wants to go home. Discussed about his persistent acidosis. Also asking for a work note.  Objective:  Vital signs in last 24 hours: Vitals:   03/09/18 0006 03/09/18 0508 03/09/18 1025 03/09/18 1028  BP: (!) 126/50 (!) 118/57  117/68  Pulse: 89 80    Resp: 19  (!) 24   Temp: 98.6 F (37 C) 99 F (37.2 C)    TempSrc: Oral Oral    SpO2:      Weight:      Height:       GEN. Well developed, well nourished young man, In no distress. Lungs. Clear bilaterally. CV. RRR Abd. Soft, non tender, BS+ve. Ext.  No edema, no cyanosis, pulses intact B/L.  Assessment/Plan:  # DKA. Resolved with closed gap. Pt. Has persist acidosis with low bicarb.Was given another bolus this morning. -Repeat BMP later today-will be discharged after improvement in Bicarb. - got DM education -He was given 70/30 flexi pen so he can keep one with him and not miss any dose. -Will need a close F/U.  # AKI, resolved  # HTN. BP within  Goal after starting lisinopril. - Cont. Lisinopril 40 mg daily.   Dispo: Anticipated discharge in approximately 0-1 day(s).   Arnetha Courser, MD 03/09/2018, 11:42 AM Pager: [phone]

## 2018-03-09 NOTE — Progress Notes (Signed)
Notified that patient did not have Novolin 70/30 insulin at home and could not get it from the [facility] outpatient pharmacy tomorrow since it is Sunday. Obtained samples of Levemir and Novolog. Converted the patient's discharge 70/30 dosing to separate long and short acting doses. Discussed with the patient that the insulin provided is only for tomorrow and should not be take with his 70/30 insulin. Discussed that the Novolog should only be taken when getting ready to eat.   Levemir 45 units BID Novolog 13 units with breakfast and dinner   Return to Novolin 70/30 prescription on 03/11/18. Patient voices understand. Teach back used. Discussed signs and symptoms of hypoglycemia.   Appreciate RN, Eugenio Hoes, support with this patient and ensuring adequate discharge care.

## 2018-03-09 NOTE — Progress Notes (Signed)
Pt to be discharged when his transportation arrives.  D/C summary and medication list reviewed with pt.   Eugenio Hoes, RN

## 2018-03-09 NOTE — Discharge Instructions (Signed)
Please pick up your prescriptions from the pharmacy on Monday. In the meantime we have provided you with some samples. Please take them as listed below until you get you Novolin 70/30 from the pharmacy: - Levemir 45 units in the morning and before bed - Novolog 13 units with breakfast and 13 units with dinner  Blood Glucose Monitoring, Adult Monitoring your blood sugar (glucose) helps you manage your diabetes. It also helps you and your health care provider determine how well your diabetes management plan is working. Blood glucose monitoring involves checking your blood glucose as often as directed, and keeping a record (log) of your results over time. Why should I monitor my blood glucose? Checking your blood glucose regularly can:  Help you understand how food, exercise, illnesses, and medicines affect your blood glucose.  Let you know what your blood glucose is at any time. You can quickly tell if you are having low blood glucose (hypoglycemia) or high blood glucose (hyperglycemia).  Help you and your health care provider adjust your medicines as needed.  When should I check my blood glucose? Follow instructions from your health care provider about how often to check your blood glucose. This may depend on:  The type of diabetes you have.  How well-controlled your diabetes is.  Medicines you are taking.  If you have type 1 diabetes:  Check your blood glucose at least 2 times a day.  Also check your blood glucose: ? Before every insulin injection. ? Before and after exercise. ? Between meals. ? 2 hours after a meal. ? Occasionally between 2:00 a.m. and 3:00 a.m., as directed. ? Before potentially dangerous tasks, like driving or using heavy machinery. ? At bedtime.  You may need to check your blood glucose more often, up to 6-10 times a day: ? If you use an insulin pump. ? If you need multiple daily injections (MDI). ? If your diabetes is not well-controlled. ? If you are  ill. ? If you have a history of severe hypoglycemia. ? If you have a history of not knowing when your blood glucose is getting low (hypoglycemia unawareness). If you have type 2 diabetes:  If you take insulin or other diabetes medicines, check your blood glucose at least 2 times a day.  If you are on intensive insulin therapy, check your blood glucose at least 4 times a day. Occasionally, you may also need to check between 2:00 a.m. and 3:00 a.m., as directed.  Also check your blood glucose: ? Before and after exercise. ? Before potentially dangerous tasks, like driving or using heavy machinery.  You may need to check your blood glucose more often if: ? Your medicine is being adjusted. ? Your diabetes is not well-controlled. ? You are ill. What is a blood glucose log?  A blood glucose log is a record of your blood glucose readings. It helps you and your health care provider: ? Look for patterns in your blood glucose over time. ? Adjust your diabetes management plan as needed.  Every time you check your blood glucose, write down your result and notes about things that may be affecting your blood glucose, such as your diet and exercise for the day.  Most glucose meters store a record of glucose readings in the meter. Some meters allow you to download your records to a computer. How do I check my blood glucose? Follow these steps to get accurate readings of your blood glucose: Supplies needed   Blood glucose meter.  Test  strips for your meter. Each meter has its own strips. You must use the strips that come with your meter.  A needle to prick your finger (lancet). Do not use lancets more than once.  A device that holds the lancet (lancing device).  A journal or log book to write down your results. Procedure  Wash your hands with soap and water.  Prick the side of your finger (not the tip) with the lancet. Use a different finger each time.  Gently rub the finger until a  small drop of blood appears.  Follow instructions that come with your meter for inserting the test strip, applying blood to the strip, and using your blood glucose meter.  Write down your result and any notes. Alternative testing sites  Some meters allow you to use areas of your body other than your finger (alternative sites) to test your blood.  If you think you may have hypoglycemia, or if you have hypoglycemia unawareness, do not use alternative sites. Use your finger instead.  Alternative sites may not be as accurate as the fingers, because blood flow is slower in these areas. This means that the result you get may be delayed, and it may be different from the result that you would get from your finger.  The most common alternative sites are: ? Forearm. ? Thigh. ? Palm of the hand. Additional tips  Always keep your supplies with you.  If you have questions or need help, all blood glucose meters have a 24-hour hotline number that you can call. You may also contact your health care provider.  After you use a few boxes of test strips, adjust (calibrate) your blood glucose meter by following instructions that came with your meter. This information is not intended to replace advice given to you by your health care provider. Make sure you discuss any questions you have with your health care provider. Document Released: 10/19/2003 Document Revised: 05/05/2016 Document Reviewed: 03/27/2016 Elsevier Interactive Patient Education  2017 [employer].

## 2018-03-09 NOTE — Progress Notes (Signed)
MD notified of pt not having any insulin at home and unable to get any until Monday.  MD visit with pt prior to discharge with insulin instruction.  Pt states he has a large supply of syringes, lancets and test strips.  Eugenio Hoes RN

## 2018-03-11 ENCOUNTER — Ambulatory Visit: Payer: Self-pay

## 2018-03-15 ENCOUNTER — Ambulatory Visit: Payer: Self-pay

## 2018-03-16 ENCOUNTER — Other Ambulatory Visit: Payer: Self-pay

## 2018-03-16 ENCOUNTER — Emergency Department (HOSPITAL_COMMUNITY): Payer: Self-pay

## 2018-03-16 ENCOUNTER — Observation Stay (HOSPITAL_COMMUNITY)
Admission: EM | Admit: 2018-03-16 | Discharge: 2018-03-18 | Discharge status: Against Medical Advice | Payer: Self-pay | Attending: Internal Medicine | Admitting: Internal Medicine

## 2018-03-16 DIAGNOSIS — J189 Pneumonia, unspecified organism: Secondary | ICD-10-CM | POA: Diagnosis present

## 2018-03-16 DIAGNOSIS — E669 Obesity, unspecified: Secondary | ICD-10-CM | POA: Insufficient documentation

## 2018-03-16 DIAGNOSIS — Z6835 Body mass index (BMI) 35.0-35.9, adult: Secondary | ICD-10-CM | POA: Insufficient documentation

## 2018-03-16 DIAGNOSIS — Z91012 Allergy to eggs: Secondary | ICD-10-CM | POA: Insufficient documentation

## 2018-03-16 DIAGNOSIS — R0902 Hypoxemia: Secondary | ICD-10-CM

## 2018-03-16 DIAGNOSIS — Z794 Long term (current) use of insulin: Secondary | ICD-10-CM | POA: Insufficient documentation

## 2018-03-16 DIAGNOSIS — Z9119 Patient's noncompliance with other medical treatment and regimen: Secondary | ICD-10-CM | POA: Insufficient documentation

## 2018-03-16 DIAGNOSIS — E119 Type 2 diabetes mellitus without complications: Secondary | ICD-10-CM | POA: Insufficient documentation

## 2018-03-16 DIAGNOSIS — IMO0001 Reserved for inherently not codable concepts without codable children: Secondary | ICD-10-CM | POA: Diagnosis present

## 2018-03-16 DIAGNOSIS — R739 Hyperglycemia, unspecified: Secondary | ICD-10-CM

## 2018-03-16 DIAGNOSIS — F17200 Nicotine dependence, unspecified, uncomplicated: Secondary | ICD-10-CM | POA: Diagnosis present

## 2018-03-16 DIAGNOSIS — F17210 Nicotine dependence, cigarettes, uncomplicated: Secondary | ICD-10-CM | POA: Insufficient documentation

## 2018-03-16 DIAGNOSIS — J45909 Unspecified asthma, uncomplicated: Secondary | ICD-10-CM | POA: Insufficient documentation

## 2018-03-16 DIAGNOSIS — D649 Anemia, unspecified: Secondary | ICD-10-CM | POA: Insufficient documentation

## 2018-03-16 DIAGNOSIS — Z79899 Other long term (current) drug therapy: Secondary | ICD-10-CM | POA: Insufficient documentation

## 2018-03-16 DIAGNOSIS — E739 Lactose intolerance, unspecified: Secondary | ICD-10-CM | POA: Insufficient documentation

## 2018-03-16 DIAGNOSIS — J181 Lobar pneumonia, unspecified organism: Secondary | ICD-10-CM

## 2018-03-16 DIAGNOSIS — E876 Hypokalemia: Secondary | ICD-10-CM | POA: Diagnosis present

## 2018-03-16 DIAGNOSIS — Z885 Allergy status to narcotic agent status: Secondary | ICD-10-CM | POA: Insufficient documentation

## 2018-03-16 DIAGNOSIS — J159 Unspecified bacterial pneumonia: Principal | ICD-10-CM | POA: Insufficient documentation

## 2018-03-16 DIAGNOSIS — I10 Essential (primary) hypertension: Secondary | ICD-10-CM | POA: Diagnosis present

## 2018-03-16 DIAGNOSIS — E109 Type 1 diabetes mellitus without complications: Secondary | ICD-10-CM

## 2018-03-16 LAB — CBG MONITORING, ED: Glucose-Capillary: 487 mg/dL — ABNORMAL HIGH (ref 65–99)

## 2018-03-16 LAB — CBC
HCT: 30.9 % — ABNORMAL LOW (ref 39.0–52.0)
Hemoglobin: 10.4 g/dL — ABNORMAL LOW (ref 13.0–17.0)
MCH: 31.1 pg (ref 26.0–34.0)
MCHC: 33.7 g/dL (ref 30.0–36.0)
MCV: 92.5 fL (ref 78.0–100.0)
PLATELETS: 248 10*3/uL (ref 150–400)
RBC: 3.34 MIL/uL — ABNORMAL LOW (ref 4.22–5.81)
RDW: 13.7 % (ref 11.5–15.5)
WBC: 6.5 10*3/uL (ref 4.0–10.5)

## 2018-03-16 LAB — BASIC METABOLIC PANEL
Anion gap: 10 (ref 5–15)
BUN: <5 mg/dL — ABNORMAL LOW (ref 6–20)
CALCIUM: 7.9 mg/dL — AB (ref 8.9–10.3)
CO2: 27 mmol/L (ref 22–32)
CREATININE: 0.82 mg/dL (ref 0.61–1.24)
Chloride: 97 mmol/L — ABNORMAL LOW (ref 101–111)
GLUCOSE: 483 mg/dL — AB (ref 65–99)
Potassium: 3.1 mmol/L — ABNORMAL LOW (ref 3.5–5.1)
Sodium: 134 mmol/L — ABNORMAL LOW (ref 135–145)

## 2018-03-16 LAB — I-STAT TROPONIN, ED: Troponin i, poc: 0 ng/mL (ref 0.00–0.08)

## 2018-03-16 MED ORDER — ALBUTEROL SULFATE (2.5 MG/3ML) 0.083% IN NEBU
5.0000 mg | INHALATION_SOLUTION | Freq: Once | RESPIRATORY_TRACT | Status: AC
  Administered 2018-03-16: 5 mg via RESPIRATORY_TRACT
  Filled 2018-03-16: qty 6

## 2018-03-16 MED ORDER — ACETAMINOPHEN 325 MG PO TABS
650.0000 mg | ORAL_TABLET | Freq: Once | ORAL | Status: AC | PRN
  Administered 2018-03-16: 650 mg via ORAL
  Filled 2018-03-16: qty 2

## 2018-03-16 NOTE — ED Triage Notes (Signed)
Pt reports chest pain and a productive cough (yellow mucus).  Pt was just released from the hospital a week ago for "my diabetes."  Pt complains of SOB upon exertion.  Pt has a fever of 101.3 in triage, Tylenol given.

## 2018-03-17 ENCOUNTER — Other Ambulatory Visit: Payer: Self-pay

## 2018-03-17 ENCOUNTER — Encounter (HOSPITAL_COMMUNITY): Payer: Self-pay | Admitting: *Deleted

## 2018-03-17 DIAGNOSIS — Z79899 Other long term (current) drug therapy: Secondary | ICD-10-CM

## 2018-03-17 DIAGNOSIS — J189 Pneumonia, unspecified organism: Secondary | ICD-10-CM

## 2018-03-17 DIAGNOSIS — Z794 Long term (current) use of insulin: Secondary | ICD-10-CM

## 2018-03-17 DIAGNOSIS — Z885 Allergy status to narcotic agent status: Secondary | ICD-10-CM

## 2018-03-17 DIAGNOSIS — Z9119 Patient's noncompliance with other medical treatment and regimen: Secondary | ICD-10-CM

## 2018-03-17 DIAGNOSIS — I10 Essential (primary) hypertension: Secondary | ICD-10-CM

## 2018-03-17 DIAGNOSIS — J159 Unspecified bacterial pneumonia: Secondary | ICD-10-CM

## 2018-03-17 DIAGNOSIS — Z91011 Allergy to milk products: Secondary | ICD-10-CM

## 2018-03-17 DIAGNOSIS — Z91012 Allergy to eggs: Secondary | ICD-10-CM

## 2018-03-17 DIAGNOSIS — J45909 Unspecified asthma, uncomplicated: Secondary | ICD-10-CM

## 2018-03-17 DIAGNOSIS — F17210 Nicotine dependence, cigarettes, uncomplicated: Secondary | ICD-10-CM

## 2018-03-17 DIAGNOSIS — E118 Type 2 diabetes mellitus with unspecified complications: Secondary | ICD-10-CM

## 2018-03-17 DIAGNOSIS — R0902 Hypoxemia: Secondary | ICD-10-CM

## 2018-03-17 HISTORY — DX: Pneumonia, unspecified organism: J18.9

## 2018-03-17 LAB — GLUCOSE, CAPILLARY
GLUCOSE-CAPILLARY: 298 mg/dL — AB (ref 65–99)
Glucose-Capillary: 272 mg/dL — ABNORMAL HIGH (ref 65–99)
Glucose-Capillary: 111 mg/dL — ABNORMAL HIGH (ref 65–99)
Glucose-Capillary: 329 mg/dL — ABNORMAL HIGH (ref 65–99)

## 2018-03-17 LAB — CBG MONITORING, ED: GLUCOSE-CAPILLARY: 433 mg/dL — AB (ref 65–99)

## 2018-03-17 MED ORDER — INSULIN ASPART 100 UNIT/ML ~~LOC~~ SOLN
0.0000 [IU] | Freq: Three times a day (TID) | SUBCUTANEOUS | Status: DC
  Administered 2018-03-17 (×2): 11 [IU] via SUBCUTANEOUS
  Administered 2018-03-18 (×3): 3 [IU] via SUBCUTANEOUS

## 2018-03-17 MED ORDER — SODIUM CHLORIDE 0.9 % IV SOLN
1.0000 g | Freq: Once | INTRAVENOUS | Status: AC
  Administered 2018-03-17: 1 g via INTRAVENOUS
  Filled 2018-03-17: qty 10

## 2018-03-17 MED ORDER — INSULIN GLARGINE 100 UNIT/ML ~~LOC~~ SOLN
60.0000 [IU] | Freq: Every day | SUBCUTANEOUS | Status: DC
  Administered 2018-03-17: 60 [IU] via SUBCUTANEOUS
  Filled 2018-03-17 (×2): qty 0.6

## 2018-03-17 MED ORDER — ALBUTEROL SULFATE (2.5 MG/3ML) 0.083% IN NEBU
5.0000 mg | INHALATION_SOLUTION | Freq: Four times a day (QID) | RESPIRATORY_TRACT | Status: DC | PRN
  Administered 2018-03-17: 2.5 mg via RESPIRATORY_TRACT
  Filled 2018-03-17: qty 6

## 2018-03-17 MED ORDER — ALBUTEROL SULFATE (2.5 MG/3ML) 0.083% IN NEBU
5.0000 mg | INHALATION_SOLUTION | Freq: Once | RESPIRATORY_TRACT | Status: AC
Start: 2018-03-17 — End: 2018-03-17
  Administered 2018-03-17: 5 mg via RESPIRATORY_TRACT
  Filled 2018-03-17: qty 6

## 2018-03-17 MED ORDER — SODIUM CHLORIDE 0.9 % IV SOLN
500.0000 mg | INTRAVENOUS | Status: DC
  Administered 2018-03-18: 500 mg via INTRAVENOUS
  Filled 2018-03-17 (×2): qty 500

## 2018-03-17 MED ORDER — ACETAMINOPHEN 325 MG PO TABS
650.0000 mg | ORAL_TABLET | Freq: Four times a day (QID) | ORAL | Status: DC | PRN
  Administered 2018-03-17 – 2018-03-18 (×6): 650 mg via ORAL
  Filled 2018-03-17 (×7): qty 2

## 2018-03-17 MED ORDER — IPRATROPIUM-ALBUTEROL 0.5-2.5 (3) MG/3ML IN SOLN
3.0000 mL | Freq: Three times a day (TID) | RESPIRATORY_TRACT | Status: DC
  Administered 2018-03-17 – 2018-03-18 (×4): 3 mL via RESPIRATORY_TRACT
  Filled 2018-03-17 (×4): qty 3

## 2018-03-17 MED ORDER — IBUPROFEN 800 MG PO TABS
800.0000 mg | ORAL_TABLET | Freq: Once | ORAL | Status: AC
  Administered 2018-03-17: 800 mg via ORAL
  Filled 2018-03-17: qty 1

## 2018-03-17 MED ORDER — LISINOPRIL 40 MG PO TABS
40.0000 mg | ORAL_TABLET | Freq: Every day | ORAL | Status: DC
  Administered 2018-03-17 – 2018-03-18 (×2): 40 mg via ORAL
  Filled 2018-03-17 (×2): qty 1

## 2018-03-17 MED ORDER — INSULIN GLARGINE 100 UNIT/ML ~~LOC~~ SOLN
60.0000 [IU] | Freq: Once | SUBCUTANEOUS | Status: AC
  Administered 2018-03-17: 60 [IU] via SUBCUTANEOUS
  Filled 2018-03-17: qty 0.6

## 2018-03-17 MED ORDER — ACETAMINOPHEN 650 MG RE SUPP: 650.0000 mg | Freq: Four times a day (QID) | RECTAL | Status: DC | PRN

## 2018-03-17 MED ORDER — ENOXAPARIN SODIUM 40 MG/0.4ML ~~LOC~~ SOLN
40.0000 mg | SUBCUTANEOUS | Status: DC
  Administered 2018-03-17: 40 mg via SUBCUTANEOUS

## 2018-03-17 MED ORDER — SODIUM CHLORIDE 0.9 % IV BOLUS
1000.0000 mL | Freq: Once | INTRAVENOUS | Status: AC
  Administered 2018-03-17: 1000 mL via INTRAVENOUS

## 2018-03-17 MED ORDER — INSULIN ASPART 100 UNIT/ML ~~LOC~~ SOLN
6.0000 [IU] | Freq: Three times a day (TID) | SUBCUTANEOUS | Status: DC
  Administered 2018-03-17 – 2018-03-18 (×3): 6 [IU] via SUBCUTANEOUS

## 2018-03-17 MED ORDER — IPRATROPIUM-ALBUTEROL 0.5-2.5 (3) MG/3ML IN SOLN: 3.0000 mL | Freq: Three times a day (TID) | RESPIRATORY_TRACT | Status: DC

## 2018-03-17 MED ORDER — INSULIN ASPART 100 UNIT/ML ~~LOC~~ SOLN
10.0000 [IU] | Freq: Once | SUBCUTANEOUS | Status: AC
  Administered 2018-03-17: 10 [IU] via SUBCUTANEOUS
  Filled 2018-03-17: qty 1

## 2018-03-17 MED ORDER — CEFTRIAXONE SODIUM 1 G IJ SOLR
1.0000 g | INTRAMUSCULAR | Status: DC
  Administered 2018-03-18: 1 g via INTRAVENOUS
  Filled 2018-03-17 (×2): qty 10

## 2018-03-17 MED ORDER — FENTANYL CITRATE (PF) 100 MCG/2ML IJ SOLN
50.0000 ug | Freq: Once | INTRAMUSCULAR | Status: AC
  Administered 2018-03-17: 50 ug via INTRAVENOUS
  Filled 2018-03-17: qty 2

## 2018-03-17 MED ORDER — SODIUM CHLORIDE 0.9 % IV SOLN
500.0000 mg | Freq: Once | INTRAVENOUS | Status: AC
  Administered 2018-03-17: 500 mg via INTRAVENOUS
  Filled 2018-03-17: qty 500

## 2018-03-17 NOTE — ED Provider Notes (Signed)
[facility] EMERGENCY DEPARTMENT Provider Note   CSN: [id number] Arrival date & time: 03/16/18  2212     History   Chief Complaint Chief Complaint  Patient presents with  . Chest Pain    HPI [name] is a 27 y.o. male.  The history is provided by the patient and a significant other.  Cough  This is a new problem. The current episode started more than 2 days ago. The problem occurs every few minutes. The problem has been gradually worsening. The maximum temperature recorded prior to his arrival was 101 to 101.9 F. Associated symptoms include chest pain, chills, sweats, sore throat and shortness of breath.  Patient with history of asthma, poorly controlled diabetes presents with cough and chest pain.  He reports about 3 days ago he began having cough.  Over the past 24 hours he is developed fevers, chills and chest pain with coughing and breathing.  He denies any recent vomiting in the past week.  No hemoptysis. Patient is a smoker  Past Medical History:  Diagnosis Date  . Asthma   . DKA (diabetic ketoacidoses) (HCC) 06/2017; 10/17/2017  . DKA (diabetic ketoacidoses) (HCC) 02/2018  . Hyperglycemia 09/24/2017  . Obesity   . Type II diabetes mellitus (HCC)    "dx'd 06/2017"    Patient Active Problem List   Diagnosis Date Noted  . DKA (diabetic ketoacidoses) (HCC) 03/06/2018  . Right bundle branch block (RBBB) determined by electrocardiography 08/24/2017  . Type 2 diabetes mellitus (HCC) 08/01/2017  . Smoking 08/01/2017  . HTN (hypertension) 07/25/2017    Past Surgical History:  Procedure Laterality Date  . LACERATION REPAIR Left    "stitched finger up"  . TONSILLECTOMY          Home Medications    Prior to Admission medications   Medication Sig Start Date End Date Taking? Authorizing Provider  insulin aspart protamine - aspart (NOVOLOG MIX 70/30 FLEXPEN) (70-30) 100 UNIT/ML FlexPen Inject 0.65 mLs (65 Units total) into the skin 2 (two)  times daily. IM Program 03/09/18   Lacroce, Ames Coupe, MD  Insulin Pen Needle 32G X 8 MM MISC 65 Units by Does not apply route 2 (two) times daily. IM Program 03/09/18   Toney Rakes, MD  lisinopril (PRINIVIL,ZESTRIL) 40 MG tablet Take 1 tablet (40 mg total) by mouth daily. IM Program 03/09/18   Toney Rakes, MD  metFORMIN (GLUCOPHAGE) 1000 MG tablet Take 1 tablet (1,000 mg total) by mouth 2 (two) times daily with a meal. 01/01/18   Beola Cord, MD    Family History No family history on file.  Social History Social History   Tobacco Use  . Smoking status: Current Every Day Smoker    Packs/day: 0.10    Years: 12.00    Pack years: 1.20    Types: Cigarettes  . Smokeless tobacco: Never Used  . Tobacco comment: cutting back - now 1-2 cigarettes per day  Substance Use Topics  . Alcohol use: Yes    Comment: 10/17/2017 "2 shots of liquor/month; maybe"  . Drug use: Yes    Types: Marijuana    Comment: 10/17/2017 ~3 times per week     Allergies   Vicodin [hydrocodone-acetaminophen]; Eggs or egg-derived products; and Lactose intolerance (gi)   Review of Systems Review of Systems  Constitutional: Positive for chills.  HENT: Positive for sore throat.   Respiratory: Positive for cough and shortness of breath.   Cardiovascular: Positive for chest pain.  All other  systems reviewed and are negative.    Physical Exam Updated Vital Signs BP (!) 131/94   Pulse (!) 106   Temp 99.6 F (37.6 C) (Oral)   Resp (!) 26   Ht 1.676 m ( )   Wt 98.4 kg (217 lb)   SpO2 91%   BMI 35.02 kg/m   Physical Exam CONSTITUTIONAL: Chronically ill-appearing HEAD: Normocephalic/atraumatic EYES: EOMI/PERRL ENMT: Mucous membranes moist NECK: supple no meningeal signs SPINE/BACK:entire spine nontender CV: S1/S2 noted, no murmurs/rubs/gallops noted LUNGS: Tachypnea noted, crackles bilaterally, scattered wheeze noted ABDOMEN: soft, nontender, no rebound or guarding, bowel sounds  noted throughout abdomen GU:no cva tenderness NEURO: Pt is awake/alert/appropriate, moves all extremitiesx4.  No facial droop.   EXTREMITIES: pulses normal/equal, full ROM, chronic edema of the lower extremities SKIN: warm, color normal PSYCH: no abnormalities of mood noted, alert and oriented to situation   ED Treatments / Results  Labs (all labs ordered are listed, but only abnormal results are displayed) Labs Reviewed  BASIC METABOLIC PANEL - Abnormal; Notable for the following components:      Result Value   Sodium 134 (*)    Potassium 3.1 (*)    Chloride 97 (*)    Glucose, Bld 483 (*)    BUN <5 (*)    Calcium 7.9 (*)    All other components within normal limits  CBC - Abnormal; Notable for the following components:   RBC 3.34 (*)    Hemoglobin 10.4 (*)    HCT 30.9 (*)    All other components within normal limits  CBG MONITORING, ED - Abnormal; Notable for the following components:   Glucose-Capillary 487 (*)    All other components within normal limits  CBG MONITORING, ED - Abnormal; Notable for the following components:   Glucose-Capillary 433 (*)    All other components within normal limits  I-STAT TROPONIN, ED    EKG EKG Interpretation  Date/Time:  Saturday Mar 16 2018 22:19:45 EDT Ventricular Rate:  96 PR Interval:  134 QRS Duration: 126 QT Interval:  358 QTC Calculation: 452 R Axis:   -37 Text Interpretation:  Normal sinus rhythm Left axis deviation Right bundle branch block Abnormal ECG No significant change since last tracing Confirmed by Zadie Rhine ([id number]) on 03/17/2018 1:55:27 AM   Radiology Dg Chest 2 View  Result Date: 03/16/2018 CLINICAL DATA:  Acute onset of cough and congestion. Shortness of breath and fever. EXAM: CHEST - 2 VIEW COMPARISON:  Chest radiograph performed 03/07/2018 FINDINGS: The lungs are well-aerated. Bilateral airspace opacities, right greater than left, raise concern for pneumonia. There is no evidence of pleural effusion or  pneumothorax. The heart is normal in size; the mediastinal contour is within normal limits. No acute osseous abnormalities are seen. IMPRESSION: Bilateral airspace opacities, right greater than left, raise concern for pneumonia. Electronically Signed   By: Roanna Raider M.D.   On: 03/16/2018 23:08    Procedures Procedures  CRITICAL CARE Performed by: Joya Gaskins Total critical care time: 35 minutes Critical care time was exclusive of separately billable procedures and treating other patients. Critical care was necessary to treat or prevent imminent or life-threatening deterioration. Critical care was time spent personally by me on the following activities: development of treatment plan with patient and/or surrogate as well as nursing, discussions with consultants, evaluation of patient's response to treatment, examination of patient, obtaining history from patient or surrogate, ordering and performing treatments and interventions, ordering and review of laboratory studies, ordering and review of radiographic  studies, pulse oximetry and re-evaluation of patient's condition. Patient with pneumonia requiring oxygen.  Patient also with hyperglycemia requiring IV fluids and insulin  Medications Ordered in ED Medications  cefTRIAXone (ROCEPHIN) 1 g in sodium chloride 0.9 % 100 mL IVPB (1 g Intravenous New Bag/Given 03/17/18 0224)  azithromycin (ZITHROMAX) 500 mg in sodium chloride 0.9 % 250 mL IVPB (has no administration in time range)  sodium chloride 0.9 % bolus 1,000 mL (1,000 mLs Intravenous New Bag/Given 03/17/18 0223)  albuterol (PROVENTIL) (2.5 MG/3ML) 0.083% nebulizer solution 5 mg (5 mg Nebulization Given 03/16/18 2235)  acetaminophen (TYLENOL) tablet 650 mg (650 mg Oral Given 03/16/18 2232)  fentaNYL (SUBLIMAZE) injection 50 mcg (50 mcg Intravenous Given 03/17/18 0226)  insulin aspart (novoLOG) injection 10 Units (10 Units Subcutaneous Given 03/17/18 0232)  albuterol (PROVENTIL) (2.5  MG/3ML) 0.083% nebulizer solution 5 mg (5 mg Nebulization Given 03/17/18 0228)     Initial Impression / Assessment and Plan / ED Course  I have reviewed the triage vital signs and the nursing notes.  Pertinent labs & imaging results that were available during my care of the patient were reviewed by me and considered in my medical decision making (see chart for details).     2:49 AM Patient with recent admission for DKA.  Now presents with cough chest pain and fever.  He has bilateral pneumonia as well as a new oxygen requirement.  He is tachypneic and is ill-appearing.  I have ordered IV fluids and IV antibiotics. He was admitted recently, but it was around 48 hours, we will treat this is community acquired pneumonia and not HCAP Updated the patient and his significant other.  I discussed the case with internal medicine resident Dr. Dimple Casey who will admit the patient no Signs of DKA at this time Final Clinical Impressions(s) / ED Diagnoses   Final diagnoses:  Community acquired pneumonia of right lower lobe of lung [facility])  Hyperglycemia  Hypoxia    ED Discharge Orders    None       Zadie Rhine, MD 03/17/18 [phone]

## 2018-03-17 NOTE — H&P (Addendum)
Date: 03/17/2018               Patient Name:  [name] MRN: [id number]  DOB: [birth year] Age / Sex: 27 y.o., male   PCP: Beola Cord, MD         Medical Service: Internal Medicine Teaching Service         Attending Physician: Dr. Earl Lagos, MD         Only Contact: Dr. Nelson Chimes Pager: [phone]       After Hours (After 5p/  First Contact Pager: [phone]  weekends / holidays): Second Contact Pager: [phone]   Chief Complaint: Cough, SOB  History of Present Illness: This is a 27 year old  noncompliant type II diabetic male also with HTN, Asthma.  He was recently hospitalized for another episode of DKA.  He canceled his hospital follow-up on Monday it was rescheduled for Friday, he no showed for that visit.  He presents today with 3 days of cough productive of yellow sputum, fever, night sweats, SOB and headache.  He has been taking over-the-counter medications to try to cope with the illness but felt it was time to come in as he was using [employer] and felt he had a pneumonia.  He does not know of any sick contacts.  He denies any myalgias or arthralgias.  He has had no nausea or vomiting or diarrhea.  He has been able to keep food and liquids down.  He had one episode of near posttussive emesis but did not vomit.  Endorses some abdominal soreness from continued coughing  He reports he did not take his insulin today because he was sleeping.    ED course: Fever in triage of 101.4, tachycardic, mildly hypertensive,  hypoxemic requiring supplemental O2.  Chest x-ray concerning for pneumonia, patient started on ceftriaxone and azithromycin.  BMP significant for normal bicarb and glucose of 487.  No white count, troponin negative, Hgb 10.4  Meds:  Current Meds  Medication Sig  . insulin aspart protamine - aspart (NOVOLOG MIX 70/30 FLEXPEN) (70-30) 100 UNIT/ML FlexPen Inject 0.65 mLs (65 Units total) into the skin 2 (two) times daily. IM Program  . lisinopril (PRINIVIL,ZESTRIL)  40 MG tablet Take 1 tablet (40 mg total) by mouth daily. IM Program  . metFORMIN (GLUCOPHAGE) 1000 MG tablet Take 1 tablet (1,000 mg total) by mouth 2 (two) times daily with a meal.     Allergies: Allergies as of 03/16/2018 - Review Complete 03/16/2018  Allergen Reaction Noted  . Vicodin [hydrocodone-acetaminophen] Anaphylaxis 01/22/2014  . Eggs or egg-derived products Nausea And Vomiting 07/25/2017  . Lactose intolerance (gi) Rash 01/22/2014   Past Medical History:  Diagnosis Date  . Asthma   . DKA (diabetic ketoacidoses) (HCC) 06/2017; 10/17/2017  . DKA (diabetic ketoacidoses) (HCC) 02/2018  . Hyperglycemia 09/24/2017  . Obesity   . Type II diabetes mellitus (HCC)    "dx'd 06/2017"    Family History:   No pertinent family history  Social History:  Social History   Socioeconomic History  . Marital status: Single    Spouse name: Not on file  . Number of children: Not on file  . Years of education: Not on file  . Highest education level: Not on file  Occupational History  . Not on file  Social Needs  . Financial resource strain: Not on file  . Food insecurity:    Worry: Not on file    Inability: Not on file  . Transportation needs:  Medical: Not on file    Non-medical: Not on file  Tobacco Use  . Smoking status: Current Every Day Smoker    Packs/day: 0.10    Years: 12.00    Pack years: 1.20    Types: Cigarettes  . Smokeless tobacco: Never Used  . Tobacco comment: cutting back - now 1-2 cigarettes per day  Substance and Sexual Activity  . Alcohol use: Yes    Comment: 10/17/2017 "2 shots of liquor/month; maybe"  . Drug use: Yes    Types: Marijuana    Comment: 10/17/2017 ~3 times per week  . Sexual activity: Never  Lifestyle  . Physical activity:    Days per week: Not on file    Minutes per session: Not on file  . Stress: Not on file  Relationships  . Social connections:    Talks on phone: Not on file    Gets together: Not on file    Attends  religious service: Not on file    Active member of club or organization: Not on file    Attends meetings of clubs or organizations: Not on file    Relationship status: Not on file  . Intimate partner violence:    Fear of current or ex partner: Not on file    Emotionally abused: Not on file    Physically abused: Not on file    Forced sexual activity: Not on file  Other Topics Concern  . Not on file  Social History Narrative  . Not on file    Review of Systems: A complete ROS was negative except as per HPI.   Physical Exam: Blood pressure (!) 147/85, pulse 100, temperature 99.6 F (37.6 C), temperature source Oral, resp. rate (!) 21, height  (1.676 m), weight 217 lb (98.4 kg), SpO2 97 %. Physical Exam  Constitutional: He appears well-developed and well-nourished.  HENT:  Head: Normocephalic and atraumatic.  Mouth/Throat: Posterior oropharyngeal erythema present. No oropharyngeal exudate.  Eyes: Right eye exhibits no discharge. Left eye exhibits no discharge. No scleral icterus.  Neck: Normal range of motion. Neck supple.  Cardiovascular: Regular rhythm, normal heart sounds and intact distal pulses.  Occasional extrasystoles are present. Tachycardia present. Exam reveals no gallop and no friction rub.  No murmur heard. Pulmonary/Chest: Effort normal. No respiratory distress. He has decreased breath sounds in the right middle field, the right lower field and the left lower field. He has wheezes (mild exp wheeze heard throughout). He has no rales.  Abdominal: Soft. Bowel sounds are normal. He exhibits no distension and no mass. There is tenderness (diffusely abd soreness from cough). There is no guarding.  Musculoskeletal:       Right lower leg: He exhibits no edema.       Left lower leg: He exhibits no edema.  Neurological: He is alert.  Skin: Skin is warm and dry.    EKG: personally reviewed my interpretation is SR, RBBB  CXR: personally reviewed my interpretation is no  effusions, bilateral opacification, peribronchial cuffing seen throughout  Assessment & Plan by Problem: Active Problems:   CAP (community acquired pneumonia)  CAP: in setting of relative immunosuppression from extremely uncontrolled diabetes.  3 days of productive cough, fevers, night sweats  Found to be hypoxemic and febrile to 101.4 in triage.  -continue ceftriaxone, azithromycin -supplemental O2 w/saturation goal 94% -IVF   T2DM:  Last A1C too high to get a reading back in March.  Pt consistently non compliant.  Home regimen should be 65 units  70/30 BID  -lantus 60 units daily -novolog 6 units TID with meals -SSI Resistant -IVF  Asthma: asthma history in chart no formal PFT's seen, mild wheeze heard on exam throughout lung fields  -PRN albuterol neb Q6  HTN: essential, chronic, on lisinopril  at home  -lisinopril  daily  Dispo: Admit patient to Inpatient with expected length of stay greater than 2 midnights.  Signed: Angelita Ingles, MD 03/17/2018, 4:06 AM

## 2018-03-17 NOTE — ED Notes (Signed)
O2 sat dropped to 86% while sleeping with a good pleth.  Placed on oxygen at 2L via [place] at this time.

## 2018-03-17 NOTE — Progress Notes (Signed)
   Subjective: Patient was feeling better when seen this morning.  He was able to sleep well last night and denies any coughing or chest pain.  He remained afebrile.  Objective:  Vital signs in last 24 hours: Vitals:   03/17/18 0300 03/17/18 0400 03/17/18 0500 03/17/18 0554  BP: (!) 147/85 (!) 142/77 (!) 142/85 (!) 121/49  Pulse: 100 (!) 116 91 96  Resp: (!) 21 (!) 27 (!) 37   Temp:    99.2 F (37.3 C)  TempSrc:    Oral  SpO2: 97% 94% 92%   Weight:    217 lb (98.4 kg)  Height:     (1.676 m)   General.  Well-developed, obese gentleman, in no acute distress. Lungs.  Scattered rhonchi more prominent on right middle and lower zone. CV.  Mild tachycardia with regular rhythm. Abdomen.  Soft, nontender, bowel sounds positive. Extremities.  No edema, no cyanosis, pulses intact and symmetrical.  Assessment/Plan:  CAP (community acquired pneumonia).  Clinically improving with no fever overnight and decreased and cough. -Continue ceftriaxone and azithromycin. -He can be transitioned to p.o. antibiotics tomorrow and should be able to go home then.  T2DM: Uncontrolled diabetes due to compliance issues.  Hyperglycemia on admission, now improving CBG with Lantus and resistant SSI.  Asthma: asthma history in chart no formal PFT's . -He is not on any asthma exacerbation at this time. -Continue DuoNeb as needed.  Hypertension.  Currently normotensive. -Continue home dose of lisinopril 40 mg daily.  Dispo: Anticipated discharge in approximately 1-2 day(s).   Arnetha Courser, MD 03/17/2018, 9:47 AM Pager: [phone]

## 2018-03-17 NOTE — ED Notes (Signed)
CBG- 433. RN notifed

## 2018-03-18 ENCOUNTER — Encounter (HOSPITAL_COMMUNITY): Payer: Self-pay | Admitting: *Deleted

## 2018-03-18 DIAGNOSIS — R51 Headache: Secondary | ICD-10-CM

## 2018-03-18 DIAGNOSIS — E119 Type 2 diabetes mellitus without complications: Secondary | ICD-10-CM

## 2018-03-18 DIAGNOSIS — R739 Hyperglycemia, unspecified: Secondary | ICD-10-CM

## 2018-03-18 DIAGNOSIS — E876 Hypokalemia: Secondary | ICD-10-CM | POA: Diagnosis present

## 2018-03-18 DIAGNOSIS — J189 Pneumonia, unspecified organism: Secondary | ICD-10-CM

## 2018-03-18 DIAGNOSIS — D649 Anemia, unspecified: Secondary | ICD-10-CM

## 2018-03-18 LAB — COMPREHENSIVE METABOLIC PANEL
ALT: 21 U/L (ref 17–63)
AST: 32 U/L (ref 15–41)
Albumin: 2.3 g/dL — ABNORMAL LOW (ref 3.5–5.0)
Alkaline Phosphatase: 76 U/L (ref 38–126)
Anion gap: 9 (ref 5–15)
BUN: <5 mg/dL — ABNORMAL LOW (ref 6–20)
CO2: 28 mmol/L (ref 22–32)
Calcium: 7.7 mg/dL — ABNORMAL LOW (ref 8.9–10.3)
Chloride: 103 mmol/L (ref 101–111)
Creatinine, Ser: 0.61 mg/dL (ref 0.61–1.24)
GFR calc Af Amer: >60 mL/min (ref >60)
GFR calc non Af Amer: >60 mL/min (ref >60)
Glucose, Bld: 129 mg/dL — ABNORMAL HIGH (ref 65–99)
Potassium: 2.6 mmol/L — CL (ref 3.5–5.1)
Sodium: 140 mmol/L (ref 135–145)
Total Bilirubin: 0.8 mg/dL (ref 0.3–1.2)
Total Protein: 5.4 g/dL — ABNORMAL LOW (ref 6.5–8.1)

## 2018-03-18 LAB — CBC
HCT: 28 % — ABNORMAL LOW (ref 39.0–52.0)
Hemoglobin: 9.5 g/dL — ABNORMAL LOW (ref 13.0–17.0)
MCH: 30.7 pg (ref 26.0–34.0)
MCHC: 33.9 g/dL (ref 30.0–36.0)
MCV: 90.6 fL (ref 78.0–100.0)
PLATELETS: 226 10*3/uL (ref 150–400)
RBC: 3.09 MIL/uL — ABNORMAL LOW (ref 4.22–5.81)
RDW: 13.5 % (ref 11.5–15.5)
WBC: 5.7 10*3/uL (ref 4.0–10.5)

## 2018-03-18 LAB — OCCULT BLOOD X 1 CARD TO LAB, STOOL: Fecal Occult Bld: NEGATIVE

## 2018-03-18 LAB — BASIC METABOLIC PANEL
Anion gap: 9 (ref 5–15)
BUN: <5 mg/dL — ABNORMAL LOW (ref 6–20)
CALCIUM: 7.8 mg/dL — AB (ref 8.9–10.3)
CO2: 30 mmol/L (ref 22–32)
CREATININE: 0.57 mg/dL — AB (ref 0.61–1.24)
Chloride: 102 mmol/L (ref 101–111)
GFR calc Af Amer: >60 mL/min (ref >60)
GFR calc non Af Amer: >60 mL/min (ref >60)
Glucose, Bld: 137 mg/dL — ABNORMAL HIGH (ref 65–99)
Potassium: 2.6 mmol/L — CL (ref 3.5–5.1)
Sodium: 141 mmol/L (ref 135–145)

## 2018-03-18 LAB — RETICULOCYTES
RBC.: 3.04 MIL/uL — ABNORMAL LOW (ref 4.22–5.81)
Retic Count, Absolute: 76 10*3/uL (ref 19.0–186.0)
Retic Ct Pct: 2.5 % (ref 0.4–3.1)

## 2018-03-18 LAB — URINALYSIS, ROUTINE W REFLEX MICROSCOPIC
Bilirubin Urine: NEGATIVE
Glucose, UA: NEGATIVE mg/dL
Hgb urine dipstick: NEGATIVE
Ketones, ur: NEGATIVE mg/dL
Leukocytes, UA: NEGATIVE
Nitrite: NEGATIVE
Protein, ur: NEGATIVE mg/dL
Specific Gravity, Urine: 1.01 (ref 1.005–1.030)
pH: 6 (ref 5.0–8.0)

## 2018-03-18 LAB — GLUCOSE, CAPILLARY
GLUCOSE-CAPILLARY: 139 mg/dL — AB (ref 65–99)
GLUCOSE-CAPILLARY: 150 mg/dL — AB (ref 65–99)
GLUCOSE-CAPILLARY: 181 mg/dL — AB (ref 65–99)
Glucose-Capillary: 148 mg/dL — ABNORMAL HIGH (ref 65–99)

## 2018-03-18 LAB — MAGNESIUM: Magnesium: 1.7 mg/dL (ref 1.7–2.4)

## 2018-03-18 LAB — LACTATE DEHYDROGENASE: LDH: 181 U/L (ref 98–192)

## 2018-03-18 MED ORDER — POTASSIUM CHLORIDE CRYS ER 20 MEQ PO TBCR
30.0000 meq | EXTENDED_RELEASE_TABLET | ORAL | Status: DC
  Administered 2018-03-18 (×2): 30 meq via ORAL
  Filled 2018-03-18 (×2): qty 1

## 2018-03-18 MED ORDER — KETOROLAC TROMETHAMINE 30 MG/ML IJ SOLN
30.0000 mg | Freq: Once | INTRAMUSCULAR | Status: AC
  Administered 2018-03-18: 30 mg via INTRAVENOUS
  Filled 2018-03-18: qty 1

## 2018-03-18 MED ORDER — INSULIN ASPART 100 UNIT/ML ~~LOC~~ SOLN
10.0000 [IU] | Freq: Three times a day (TID) | SUBCUTANEOUS | Status: DC
Start: 2018-03-18 — End: 2018-03-19
  Administered 2018-03-18 (×2): 10 [IU] via SUBCUTANEOUS

## 2018-03-18 MED ORDER — IBUPROFEN 800 MG PO TABS: 800.0000 mg | ORAL_TABLET | Freq: Three times a day (TID) | ORAL | Status: DC | PRN

## 2018-03-18 MED ORDER — DIPHENHYDRAMINE HCL 50 MG/ML IJ SOLN
25.0000 mg | Freq: Once | INTRAMUSCULAR | Status: AC
  Administered 2018-03-18: 25 mg via INTRAVENOUS
  Filled 2018-03-18: qty 1

## 2018-03-18 MED ORDER — POTASSIUM CHLORIDE CRYS ER 20 MEQ PO TBCR
30.0000 meq | EXTENDED_RELEASE_TABLET | Freq: Two times a day (BID) | ORAL | Status: DC
  Administered 2018-03-18: 30 meq via ORAL
  Filled 2018-03-18: qty 1

## 2018-03-18 MED ORDER — LEVOFLOXACIN 750 MG PO TABS: 750.0000 mg | ORAL_TABLET | Freq: Every day | ORAL | 0 refills | Status: AC

## 2018-03-18 MED ORDER — PROCHLORPERAZINE EDISYLATE 10 MG/2ML IJ SOLN
10.0000 mg | Freq: Once | INTRAMUSCULAR | Status: AC
  Administered 2018-03-18: 10 mg via INTRAVENOUS
  Filled 2018-03-18: qty 2

## 2018-03-18 MED ORDER — IPRATROPIUM-ALBUTEROL 0.5-2.5 (3) MG/3ML IN SOLN: 3.0000 mL | Freq: Four times a day (QID) | RESPIRATORY_TRACT | Status: DC | PRN

## 2018-03-18 MED ORDER — POTASSIUM CHLORIDE 10 MEQ/100ML IV SOLN
10.0000 meq | INTRAVENOUS | Status: AC
  Administered 2018-03-18 (×6): 10 meq via INTRAVENOUS
  Filled 2018-03-18 (×4): qty 100

## 2018-03-18 MED ORDER — IBUPROFEN 600 MG PO TABS
600.0000 mg | ORAL_TABLET | Freq: Once | ORAL | Status: DC
  Filled 2018-03-18: qty 1

## 2018-03-18 NOTE — Progress Notes (Signed)
   Subjective: Patient was complaining of headache since last night and using Tylenol every 6 hourly with not much relief.  He denies any nausea or vomiting or vision change.  He denies any chest pain.  His cough is improving.  Denies any shortness of breath.  Objective:  Vital signs in last 24 hours: Vitals:   03/17/18 1558 03/17/18 2036 03/17/18 2205 03/18/18 0500  BP:   131/86 (!) 149/84  Pulse:   73 94  Resp:   18   Temp:   98.5 F (36.9 C) 100 F (37.8 C)  TempSrc:   Oral Oral  SpO2: 92% 94% 98% 97%  Weight:      Height:       General.  Well-developed, well-nourished gentleman, in no acute distress. Lungs.  Very few scattered rhonchi more on the right, much improved as compared to yesterday. CV.  Regular rate and rhythm. Abdomen.  Soft, nontender, bowel sounds positive. Extremities.  No edema, no cyanosis, pulses intact and symmetrical.  Assessment/Plan:  CAP (community acquired pneumonia).    Clinically improving, saturating well on room air.  Was able to ambulate without being short of breath. -We will continue ceftriaxone and azithromycin while in hospital and he will be discharged later today on Levaquin.  Hypokalemia.  He was having potassium of 2.6 this morning.  Repleting potassium with 6 runs of IV and 60 mEq oral. -Repeat BMP at 3 PM before discharging patient.  New onset anemia.  Patient found to have hemoglobin of 9.5 this morning, on admission it was 10.4.  Per chart review patient hemoglobin remains between 14-17.  He was discharged from hospital on May 9 with an hemoglobin of 16.7.  Patient denies any obvious bleeding. Denies any abdominal pain, hematuria or melena.  No history of nausea, vomiting or hematemesis.  He denies any preceding rash or arthralgia but he was having generalized malaise and aches and pains. All of his cell lines are decrease. We will check reticulocyte count, LDH, haptoglobin, UA and FOBT. If reticulocyte counts are low-we will check for  parvovirus B19 antibodies.  T2DM: Controlled diabetes with elevated blood glucose level on admission.  His CBG this morning was 139. -We will continue Lantus 60 units at bedtime. -Increase mealtime coverage to 10 units. -Continue resistant SSI.  Hypertension.  Mildly elevated this morning but has not received his lisinopril.  He remained normotensive yesterday after taking lisinopril. -Continue lisinopril 40 mg daily.  Dispo: Anticipated discharge in approximately 0-1 day(s).   Arnetha Courser, MD 03/18/2018, 10:13 AM Pager: [phone]

## 2018-03-18 NOTE — Progress Notes (Signed)
Called to room by pt stated that he wants his IV taken out bec he is going home, pt is agitated and looks like having verbal argument with his girlfriend , told pt about consequences of going home against medical advice , pt signed HAMA form and left, Dr. Anthonette Legato aware of pt's HAMA

## 2018-03-18 NOTE — Progress Notes (Signed)
Night Team Interval Progress Note   Paged that pt desired to leave the hospital after appearing agitated. Unfortunately, pt had already left prior to being able to speak to him about his reasons for leaving the hospital and formulating a plan for appropriate follow up, outpatient antiobiotics, and repeat labs for both potassium and anemia. It appears he received all 120 mEq of intended potassium replacement prior to leaving. Attempted to call number listed in patient's chart to further discuss, but went straight to voicemail. Will send Rx for levofloxacin for total of five day course (end date 5/23) to pharmacy on file. Left voicemail encouraging pt to pick up this prescription and also follow up with the clinic for repeat labs. Will send a message to clinic staff to also attempt to reach out in the am to schedule follow up.

## 2018-03-18 NOTE — Progress Notes (Signed)
CRITICAL VALUE ALERT  Critical Value:  K 2.6  Date & Time Notied:  03/18/2018 12:18 PM   Provider Notified: [id number]  Orders Received/Actions taken: 1220 Dr. Nelson Chimes called with new orders

## 2018-03-19 ENCOUNTER — Telehealth: Payer: Self-pay | Admitting: *Deleted

## 2018-03-19 LAB — HAPTOGLOBIN: Haptoglobin: 177 mg/dL (ref 34–200)

## 2018-03-19 NOTE — Telephone Encounter (Signed)
-----   Message from Ginger Carne, MD sent at 03/18/2018 10:00 PM EDT ----- Regarding: Hospital Follow up appt  Good morning,   Unfortunately this pt left the hospital suddenly overnight. Could we try to reach out to him to schedule a hospital follow up appointment for repeat labs and to see how he's doing, ideally tomorrow (5/21) or the next day (5/22). If we are able to get a hold of him, reminding him antibiotics were sent to the [facility] pharmacy and should be picked up would also be helpful.   I left a voicemail with these instructions shortly after he left, so hopefully is aware and amenable to coming into the clinic.   Thank you,   Kyler

## 2018-03-19 NOTE — Telephone Encounter (Signed)
Attempted to reach pt to schedulle same day visit in [facility]. No answer. Message left on VM requesting return call. Kinnie Feil, RN, BSN

## 2018-03-20 ENCOUNTER — Encounter (HOSPITAL_COMMUNITY): Payer: Self-pay | Admitting: Emergency Medicine

## 2018-03-20 ENCOUNTER — Emergency Department (HOSPITAL_COMMUNITY)
Admission: EM | Admit: 2018-03-20 | Discharge: 2018-03-20 | Discharge status: Against Medical Advice | Payer: Self-pay | Attending: Emergency Medicine | Admitting: Emergency Medicine

## 2018-03-20 DIAGNOSIS — R109 Unspecified abdominal pain: Secondary | ICD-10-CM | POA: Insufficient documentation

## 2018-03-20 DIAGNOSIS — Z5321 Procedure and treatment not carried out due to patient leaving prior to being seen by health care provider: Secondary | ICD-10-CM | POA: Insufficient documentation

## 2018-03-20 LAB — PARVOVIRUS B19 ANTIBODY, IGG AND IGM
Parovirus B19 IgG Abs: 0.4 index (ref 0.0–0.8)
Parovirus B19 IgM Abs: 0.3 index (ref 0.0–0.8)

## 2018-03-20 NOTE — ED Notes (Signed)
No response in waiting room x3

## 2018-03-20 NOTE — ED Notes (Signed)
Follow up call made  No answer  1315   03/20/18  s Yamilette Garretson rn

## 2018-03-20 NOTE — ED Triage Notes (Signed)
Pt presents with "knot on my stomach"; RN cannot not appreciate said knot; no redness, swelling or drainage noted; pt states he can" feel it moving around in there"; pt also states he was vomiting earlier, recently discharged for pneumonia

## 2018-03-22 NOTE — Telephone Encounter (Signed)
Attempted to call pt, went straight to vmail, lm for rtc 

## 2018-03-27 NOTE — Telephone Encounter (Signed)
Patient returned to ED on 03/20/2018. Has appt with PCP 05/09/2018. Kinnie Feil, RN, BSN

## 2018-04-01 NOTE — Telephone Encounter (Signed)
Patient walked in requesting med list and docs indicating number of hospitalizations since Sept for court appearance tomorrow for disability. Informed patient that staff had been trying to reach him to schedule HFU. [facility] 3:45. Request for docs handled by front office staff. Kinnie FeilL. [place], RN, BSN

## 2018-04-02 ENCOUNTER — Ambulatory Visit: Payer: Self-pay

## 2018-04-04 ENCOUNTER — Encounter: Payer: Self-pay | Admitting: Internal Medicine

## 2018-04-09 ENCOUNTER — Emergency Department (HOSPITAL_COMMUNITY)
Admission: EM | Admit: 2018-04-09 | Discharge: 2018-04-09 | Discharge status: Against Medical Advice | Payer: Self-pay | Attending: Emergency Medicine | Admitting: Emergency Medicine

## 2018-04-09 ENCOUNTER — Emergency Department (HOSPITAL_COMMUNITY): Payer: Self-pay

## 2018-04-09 ENCOUNTER — Encounter (HOSPITAL_COMMUNITY): Payer: Self-pay | Admitting: Emergency Medicine

## 2018-04-09 ENCOUNTER — Other Ambulatory Visit: Payer: Self-pay

## 2018-04-09 ENCOUNTER — Telehealth: Payer: Self-pay | Admitting: *Deleted

## 2018-04-09 ENCOUNTER — Emergency Department (HOSPITAL_COMMUNITY)
Admission: EM | Admit: 2018-04-09 | Discharge: 2018-04-09 | Disposition: A | Discharge status: Home / Self Care | Payer: Self-pay | Attending: Emergency Medicine | Admitting: Emergency Medicine

## 2018-04-09 DIAGNOSIS — Z5321 Procedure and treatment not carried out due to patient leaving prior to being seen by health care provider: Secondary | ICD-10-CM | POA: Insufficient documentation

## 2018-04-09 DIAGNOSIS — R739 Hyperglycemia, unspecified: Secondary | ICD-10-CM | POA: Insufficient documentation

## 2018-04-09 DIAGNOSIS — R079 Chest pain, unspecified: Secondary | ICD-10-CM | POA: Insufficient documentation

## 2018-04-09 LAB — CBC
HCT: 47.4 % (ref 39.0–52.0)
Hemoglobin: 16.9 g/dL (ref 13.0–17.0)
MCH: 30 pg (ref 26.0–34.0)
MCHC: 35.7 g/dL (ref 30.0–36.0)
MCV: 84 fL (ref 78.0–100.0)
Platelets: 281 10*3/uL (ref 150–400)
RBC: 5.64 MIL/uL (ref 4.22–5.81)
RDW: 13 % (ref 11.5–15.5)
WBC: 6.5 10*3/uL (ref 4.0–10.5)

## 2018-04-09 LAB — URINALYSIS, ROUTINE W REFLEX MICROSCOPIC
BILIRUBIN URINE: NEGATIVE
Bacteria, UA: NONE SEEN
Glucose, UA: >=500 mg/dL — AB
HGB URINE DIPSTICK: NEGATIVE
Ketones, ur: 5 mg/dL — AB
Leukocytes, UA: NEGATIVE
NITRITE: NEGATIVE
PROTEIN: NEGATIVE mg/dL
Specific Gravity, Urine: 1.026 (ref 1.005–1.030)
pH: 6 (ref 5.0–8.0)

## 2018-04-09 LAB — I-STAT CHEM 8, ED
BUN: 11 mg/dL (ref 6–20)
CALCIUM ION: 1.13 mmol/L — AB (ref 1.15–1.40)
CHLORIDE: 93 mmol/L — AB (ref 101–111)
Creatinine, Ser: 0.6 mg/dL — ABNORMAL LOW (ref 0.61–1.24)
Glucose, Bld: >700 mg/dL (ref 65–99)
HEMATOCRIT: 52 % (ref 39.0–52.0)
Hemoglobin: 17.7 g/dL — ABNORMAL HIGH (ref 13.0–17.0)
Potassium: 4.4 mmol/L (ref 3.5–5.1)
SODIUM: 127 mmol/L — AB (ref 135–145)
TCO2: 21 mmol/L — AB (ref 22–32)

## 2018-04-09 LAB — CBG MONITORING, ED: Glucose-Capillary: >600 mg/dL (ref 65–99)

## 2018-04-09 LAB — BASIC METABOLIC PANEL
Anion gap: 13 (ref 5–15)
BUN: 9 mg/dL (ref 6–20)
CALCIUM: 9.5 mg/dL (ref 8.9–10.3)
CHLORIDE: 92 mmol/L — AB (ref 101–111)
CO2: 21 mmol/L — AB (ref 22–32)
Creatinine, Ser: 0.76 mg/dL (ref 0.61–1.24)
GFR calc non Af Amer: >60 mL/min (ref >60)
Glucose, Bld: 764 mg/dL (ref 65–99)
Potassium: 4.4 mmol/L (ref 3.5–5.1)
Sodium: 126 mmol/L — ABNORMAL LOW (ref 135–145)

## 2018-04-09 NOTE — ED Notes (Signed)
Pt's CBG result was "HI". Informed Lanora ManisElizabeth - PA and [facility].

## 2018-04-09 NOTE — ED Notes (Signed)
Informed first nurse on I-stat chem 8

## 2018-04-09 NOTE — ED Provider Notes (Signed)
Patient placed in Quick Look pathway, seen and evaluated   Chief Complaint: Chest pain, hyperglycemia.   HPI:   Chest pain since this morning.  Has the medicine but "has been busy".  [facility] and his sugar was over 600.  He has a little shortness of breath.   ROS: No fevers   Physical Exam:   Gen: No distress  Neuro: Awake and Alert  Skin: Warm    Focused Exam: Normal respiratory rate.  Moist mucus membranes.     Initiation of care has begun. The patient has been counseled on the process, plan, and necessity for staying for the completion/evaluation, and the remainder of the medical screening examination.     Cristina GongHammond, Tommie Dejoseph W, PA-C 04/09/18 1654    Rolan BuccoBelfi, Melanie, MD 04/09/18 2035

## 2018-04-09 NOTE — ED Triage Notes (Signed)
Pt states he has been feeling nauseous and been vomiting. Pt also complains of CP. Pt has diabetes and [facility]- reportedly his CBG was >600. Pt left [facility] because he says he "had to do something" and then came here. Known hx of diabetes, has not taken meds in 4 days

## 2018-04-09 NOTE — Telephone Encounter (Signed)
Pt presents wanting a check up, explained that pt has appt 7/11 w/ dr Alinda Moneymelvin for check up. He then states he [facility], cbg he thinks was 500, review chart >600. Pt states he doesn't always take his meds, does not check his blood sugar and does not follow medical advice. Had scheduled [facility] appt but at this time will take him to ED after speaking w/ attending

## 2018-04-09 NOTE — ED Notes (Signed)
Pt called x3 for updating vitals no response. 

## 2018-04-09 NOTE — ED Triage Notes (Addendum)
Pt states eyes are dried out, chest pain, nausea, dizziness. Pt states he is a type 2 diabetic. Pt states he has been noncompliant with medication. Pt states CP as well

## 2018-04-09 NOTE — Telephone Encounter (Signed)
Discussed with Myriam JacobsonHelen, agree with plan to be evaluated in ED if he is willing.

## 2018-04-10 ENCOUNTER — Encounter: Payer: Self-pay | Admitting: Internal Medicine

## 2018-04-10 ENCOUNTER — Ambulatory Visit: Payer: Self-pay

## 2018-04-10 NOTE — ED Notes (Signed)
Follow up call made  No answer  04/10/18  1248 s Hortense Cantrall rn

## 2018-04-15 ENCOUNTER — Encounter: Payer: Self-pay | Admitting: Internal Medicine

## 2018-04-24 ENCOUNTER — Telehealth: Payer: Self-pay | Admitting: Pharmacist

## 2018-05-01 NOTE — Progress Notes (Signed)
Tried contacting patient for follow up medication help, unable to reach

## 2018-05-09 ENCOUNTER — Encounter: Payer: Self-pay | Admitting: Internal Medicine

## 2018-05-09 NOTE — Assessment & Plan Note (Deleted)
A1c today -----. This is ---- from his previous of 61>14. His random blood sugar is -------, which is ---- consistent with his report home readings and improved from previous presentations.  He has not been taking his insulin 70/30 60U BID consistently and has been seen in the ED 4-5 times with hyperglcemia and admitted once for DKA since our last appointment.   Blood sugars at home have been running in the -----s to ---s, but he did not bring his meter today.  He denies any side effects including diarrhea with his medications. He denies increased urination or thirst as he has experienced in the past.  With persistently elevated A1c we will increase insulin dose to 65 Units BID and restart metformin at 1000mg  BID. - Insulin 70/30 65U BID ----- - Metformin 1000mg  BID - Foot exam today  - Patient is to start working on obtaining orange card, will refer to ophthalmology when able  - Further review shows urinalysis for microalbuminuria has not been done, will order at follow up. (Patient is on lisinopril already for HTN)

## 2018-05-09 NOTE — Assessment & Plan Note (Deleted)
BP Today --- / ---/ This is ----- from 143/86 at last visit. He states he ----- been taking his Lisinopril. -----   - Lisinopril 40mg  daily

## 2018-06-01 DIAGNOSIS — L0231 Cutaneous abscess of buttock: Secondary | ICD-10-CM | POA: Insufficient documentation

## 2018-07-17 ENCOUNTER — Other Ambulatory Visit: Payer: Self-pay

## 2018-07-17 ENCOUNTER — Emergency Department (HOSPITAL_COMMUNITY)
Admission: EM | Admit: 2018-07-17 | Discharge: 2018-07-17 | Disposition: A | Discharge status: Home / Self Care | Payer: Self-pay | Attending: Emergency Medicine | Admitting: Emergency Medicine

## 2018-07-17 ENCOUNTER — Encounter (HOSPITAL_COMMUNITY): Payer: Self-pay

## 2018-07-17 DIAGNOSIS — E119 Type 2 diabetes mellitus without complications: Secondary | ICD-10-CM | POA: Insufficient documentation

## 2018-07-17 DIAGNOSIS — I10 Essential (primary) hypertension: Secondary | ICD-10-CM | POA: Insufficient documentation

## 2018-07-17 DIAGNOSIS — F17210 Nicotine dependence, cigarettes, uncomplicated: Secondary | ICD-10-CM | POA: Insufficient documentation

## 2018-07-17 DIAGNOSIS — K0889 Other specified disorders of teeth and supporting structures: Secondary | ICD-10-CM | POA: Insufficient documentation

## 2018-07-17 DIAGNOSIS — J45909 Unspecified asthma, uncomplicated: Secondary | ICD-10-CM | POA: Insufficient documentation

## 2018-07-17 DIAGNOSIS — Z794 Long term (current) use of insulin: Secondary | ICD-10-CM | POA: Insufficient documentation

## 2018-07-17 DIAGNOSIS — Z79899 Other long term (current) drug therapy: Secondary | ICD-10-CM | POA: Insufficient documentation

## 2018-07-17 MED ORDER — CLINDAMYCIN HCL 150 MG PO CAPS: 450.0000 mg | ORAL_CAPSULE | Freq: Three times a day (TID) | ORAL | 0 refills | Status: DC

## 2018-07-17 MED ORDER — CLINDAMYCIN HCL 150 MG PO CAPS: 300.0000 mg | ORAL_CAPSULE | Freq: Three times a day (TID) | ORAL | 0 refills | Status: DC

## 2018-07-17 MED ORDER — NAPROXEN 500 MG PO TABS: 500.0000 mg | ORAL_TABLET | Freq: Two times a day (BID) | ORAL | 0 refills | Status: DC

## 2018-07-17 NOTE — Discharge Instructions (Addendum)
You were seen in the ER for dental pain.  Pain is likely from early infection.  There are no signs of abscess.  Take clindamycin as prescribed.   Take 1000 mg of acetaminophen (Tylenol) every 6 hours.  For more pain control take naproxen 500 mg every 12 hours.    Unfortunately, dental pain will continue until you have a full dental evaluation and treatment.  See dental resources attached.  Additionally, I have given you Dr. Mayford Knifeurner and Dr. Leanord AsalFarless contact information, they frequently try to accommodate patients from the Er.  Return to the ER for fevers, facial or gum line swelling, redness, pus.

## 2018-07-17 NOTE — ED Triage Notes (Signed)
Pt presents for evaluation of dental pain x 4 days. Reports he thinks he has an infection. Pt reports no improvement to pain with advil at home.

## 2018-07-17 NOTE — ED Provider Notes (Signed)
[facility] EMERGENCY DEPARTMENT Provider Note   CSN: [id number] Arrival date & time: 07/17/18  1308     History   Chief Complaint Chief Complaint  Patient presents with  . Dental Pain    HPI [name] is a 27 y.o. male with h/o asthma, diabetes on insulin, DKA, here for evaluation of dental pain x 4 days. Sudden, gradually worsening, moderate/severe.  Worse with eating, palpation, liquids.  Pain is to the front upper gumline.  Has taken ibuprofen without relief.  No trauma. Denies associated fevers, chills, facial or gumline swelling, redness, drainage, changes in voice.   HPI  Past Medical History:  Diagnosis Date  . Asthma   . DKA (diabetic ketoacidoses) (HCC) 06/2017; 10/17/2017  . DKA (diabetic ketoacidoses) (HCC) 02/2018  . Hyperglycemia 09/24/2017  . Obesity   . Type II diabetes mellitus (HCC)    "dx'd 06/2017"    Patient Active Problem List   Diagnosis Date Noted  . Hypokalemia 03/18/2018  . Normocytic anemia 03/18/2018  . Hyperglycemia   . CAP (community acquired pneumonia) 03/17/2018  . DKA (diabetic ketoacidoses) (HCC) 03/06/2018  . Right bundle branch block (RBBB) determined by electrocardiography 08/24/2017  . Type 2 diabetes mellitus (HCC) 08/01/2017  . Smoking 08/01/2017  . HTN (hypertension) 07/25/2017    Past Surgical History:  Procedure Laterality Date  . LACERATION REPAIR Left    "stitched finger up"  . TONSILLECTOMY          Home Medications    Prior to Admission medications   Medication Sig Start Date End Date Taking? Authorizing Provider  clindamycin (CLEOCIN) 150 MG capsule Take 2 capsules (300 mg total) by mouth 3 (three) times daily for 7 days. 07/17/18 07/24/18  Liberty Handy, PA-C  insulin aspart protamine - aspart (NOVOLOG MIX 70/30 FLEXPEN) (70-30) 100 UNIT/ML FlexPen Inject 0.65 mLs (65 Units total) into the skin 2 (two) times daily. IM Program 03/09/18   Lacroce, Ames Coupe, MD  Insulin Pen Needle  32G X 8 MM MISC 65 Units by Does not apply route 2 (two) times daily. IM Program 03/09/18   Toney Rakes, MD  lisinopril (PRINIVIL,ZESTRIL) 40 MG tablet Take 1 tablet (40 mg total) by mouth daily. IM Program Patient not taking: Reported on 04/09/2018 03/09/18   Toney Rakes, MD  metFORMIN (GLUCOPHAGE) 1000 MG tablet Take 1 tablet (1,000 mg total) by mouth 2 (two) times daily with a meal. 01/01/18   Beola Cord, MD  naproxen (NAPROSYN) 500 MG tablet Take 1 tablet (500 mg total) by mouth 2 (two) times daily. 07/17/18   Liberty Handy, PA-C    Family History No family history on file.  Social History Social History   Tobacco Use  . Smoking status: Current Every Day Smoker    Packs/day: 0.10    Years: 12.00    Pack years: 1.20    Types: Cigarettes  . Smokeless tobacco: Never Used  . Tobacco comment: cutting back - now 1-2 cigarettes per day  Substance Use Topics  . Alcohol use: Yes    Comment: 10/17/2017 "2 shots of liquor/month; maybe"  . Drug use: Yes    Types: Marijuana    Comment: 10/17/2017 ~3 times per week     Allergies   Vicodin [hydrocodone-acetaminophen]; Eggs or egg-derived products; and Lactose intolerance (gi)   Review of Systems Review of Systems  HENT: Positive for dental problem.   Allergic/Immunologic: Positive for immunocompromised state (diabetes).  All other systems reviewed and are  negative.    Physical Exam Updated Vital Signs BP 130/89 (BP Location: Right Arm)   Pulse 80   Temp 98.4 F (36.9 C) (Oral)   Resp 18   SpO2 100%   Physical Exam  Constitutional: He is oriented to person, place, and time. He appears well-developed and well-nourished.  Non-toxic appearance.  HENT:  Head: Normocephalic.  Right Ear: External ear normal.  Left Ear: External ear normal.  Nose: Nose normal.  Focal tenderness to apex of tooth #8.  No obvious fracture or cavity on this tooth.  Tenderness with percussion.  No surrounding gumline erythema,  edema, fluctuance, drainage.  MMM. Oropharynx and tonsils easily visualized and normal. Normal tongue protrusion, no SL edema or tenderness. No anterior facial and maxillary edema.   Eyes: Conjunctivae and EOM are normal.  Neck: Full passive range of motion without pain.  Cardiovascular: Normal rate.  Pulmonary/Chest: Effort normal. No tachypnea. No respiratory distress.  Musculoskeletal: Normal range of motion.  Neurological: He is alert and oriented to person, place, and time.  Skin: Skin is warm and dry. Capillary refill takes less than 2 seconds.  Psychiatric: His behavior is normal. Thought content normal.     ED Treatments / Results  Labs (all labs ordered are listed, but only abnormal results are displayed) Labs Reviewed - No data to display  EKG None  Radiology No results found.  Procedures Procedures (including critical care time)  Medications Ordered in ED Medications - No data to display   Initial Impression / Assessment and Plan / ED Course  I have reviewed the triage vital signs and the nursing notes.  Pertinent labs & imaging results that were available during my care of the patient were reviewed by me and considered in my medical decision making (see chart for details).     Dental pain likely from apical abscess focal tenderness on exam, likely early developing infection.  No actual focal abscess amenable for I&D today.  Patient afebrile, non toxic appearing, swallowing secretions well without hot potato voice. Exam unconcerning for Ludwig's angina or other deep tissue infection in neck. No anterior facial/maxillary edema or tenderness. I think it is reasonable to start pt on antibiotics as my concern is apical abscess in pt with h/o DKA, diabetes on insulin he is considered higher risk. Urged patient to follow-up with dentist.  Patient given dentist contact info, encouraged to follow up in 1-2 days for ultimate management of dental pain and overall dental health.  Strict ED return precautions given. Pt is aware of red flag symptoms that would warrant return to ED for re-evaluation and further treatment. Patient voices understanding and is agreeable to plan. Final Clinical Impressions(s) / ED Diagnoses   Final diagnoses:  Pain, dental    ED Discharge Orders         Ordered    clindamycin (CLEOCIN) 150 MG capsule  3 times daily,   Status:  Discontinued     07/17/18 1040    naproxen (NAPROSYN) 500 MG tablet  2 times daily,   Status:  Discontinued     07/17/18 1041    clindamycin (CLEOCIN) 150 MG capsule  3 times daily     07/17/18 1046    naproxen (NAPROSYN) 500 MG tablet  2 times daily     07/17/18 1046           Jerrell MylarGibbons, Andrey Hoobler J, PA-C 07/17/18 1055    Azalia Bilisampos, Kevin, MD 07/18/18 0800

## 2018-07-19 ENCOUNTER — Other Ambulatory Visit: Payer: Self-pay

## 2018-07-19 ENCOUNTER — Emergency Department (HOSPITAL_COMMUNITY)
Admission: EM | Admit: 2018-07-19 | Discharge: 2018-07-19 | Disposition: A | Discharge status: Home / Self Care | Payer: Self-pay | Attending: Emergency Medicine | Admitting: Emergency Medicine

## 2018-07-19 ENCOUNTER — Encounter (HOSPITAL_COMMUNITY): Payer: Self-pay | Admitting: Emergency Medicine

## 2018-07-19 DIAGNOSIS — Z794 Long term (current) use of insulin: Secondary | ICD-10-CM | POA: Insufficient documentation

## 2018-07-19 DIAGNOSIS — F17210 Nicotine dependence, cigarettes, uncomplicated: Secondary | ICD-10-CM | POA: Insufficient documentation

## 2018-07-19 DIAGNOSIS — E119 Type 2 diabetes mellitus without complications: Secondary | ICD-10-CM | POA: Insufficient documentation

## 2018-07-19 DIAGNOSIS — I10 Essential (primary) hypertension: Secondary | ICD-10-CM | POA: Insufficient documentation

## 2018-07-19 DIAGNOSIS — K047 Periapical abscess without sinus: Secondary | ICD-10-CM

## 2018-07-19 MED ORDER — AMOXICILLIN 500 MG PO CAPS: 500.0000 mg | ORAL_CAPSULE | Freq: Three times a day (TID) | ORAL | 0 refills | Status: DC

## 2018-07-19 NOTE — ED Provider Notes (Signed)
[facility] EMERGENCY DEPARTMENT Provider Note   CSN: [id number] Arrival date & time: 07/19/18  1504     History   Chief Complaint Chief Complaint  Patient presents with  . Dental Pain  . Abscess    HPI [name] is a 27 y.o. male who presents to the ED for an abscess inside his mouth. Patient reports that the area started 6 days ago and he came in a few days ago and was given Rx for Clindamycin but could not afford the medication. Now the area is larger and more painful. Patient denies fever or chills.   HPI  Past Medical History:  Diagnosis Date  . Asthma   . DKA (diabetic ketoacidoses) (HCC) 06/2017; 10/17/2017  . DKA (diabetic ketoacidoses) (HCC) 02/2018  . Hyperglycemia 09/24/2017  . Obesity   . Type II diabetes mellitus (HCC)    "dx'd 06/2017"    Patient Active Problem List   Diagnosis Date Noted  . Hypokalemia 03/18/2018  . Normocytic anemia 03/18/2018  . Hyperglycemia   . CAP (community acquired pneumonia) 03/17/2018  . DKA (diabetic ketoacidoses) (HCC) 03/06/2018  . Right bundle branch block (RBBB) determined by electrocardiography 08/24/2017  . Type 2 diabetes mellitus (HCC) 08/01/2017  . Smoking 08/01/2017  . HTN (hypertension) 07/25/2017    Past Surgical History:  Procedure Laterality Date  . LACERATION REPAIR Left    "stitched finger up"  . TONSILLECTOMY          Home Medications    Prior to Admission medications   Medication Sig Start Date End Date Taking? Authorizing Provider  amoxicillin (AMOXIL) 500 MG capsule Take 1 capsule (500 mg total) by mouth 3 (three) times daily. 07/19/18   Janne Napoleon, NP  insulin aspart protamine - aspart (NOVOLOG MIX 70/30 FLEXPEN) (70-30) 100 UNIT/ML FlexPen Inject 0.65 mLs (65 Units total) into the skin 2 (two) times daily. IM Program 03/09/18   Lacroce, Ames Coupe, MD  Insulin Pen Needle 32G X 8 MM MISC 65 Units by Does not apply route 2 (two) times daily. IM Program 03/09/18    Toney Rakes, MD  lisinopril (PRINIVIL,ZESTRIL) 40 MG tablet Take 1 tablet (40 mg total) by mouth daily. IM Program Patient not taking: Reported on 04/09/2018 03/09/18   Toney Rakes, MD  metFORMIN (GLUCOPHAGE) 1000 MG tablet Take 1 tablet (1,000 mg total) by mouth 2 (two) times daily with a meal. 01/01/18   Beola Cord, MD  naproxen (NAPROSYN) 500 MG tablet Take 1 tablet (500 mg total) by mouth 2 (two) times daily. 07/17/18   Liberty Handy, PA-C    Family History No family history on file.  Social History Social History   Tobacco Use  . Smoking status: Current Every Day Smoker    Packs/day: 0.10    Years: 12.00    Pack years: 1.20    Types: Cigarettes  . Smokeless tobacco: Never Used  . Tobacco comment: cutting back - now 1-2 cigarettes per day  Substance Use Topics  . Alcohol use: Yes    Comment: 10/17/2017 "2 shots of liquor/month; maybe"  . Drug use: Yes    Types: Marijuana    Comment: 10/17/2017 ~3 times per week     Allergies   Vicodin [hydrocodone-acetaminophen]; Eggs or egg-derived products; and Lactose intolerance (gi)   Review of Systems Review of Systems  HENT: Positive for dental problem.   All other systems reviewed and are negative.    Physical Exam Updated Vital Signs BP  119/73   Pulse 75   Temp 98.5 F (36.9 C) (Oral)   Resp 16   Ht 5\' 6"  (1.676 m)   Wt 79.4 kg   SpO2 100%   BMI 28.25 kg/m   Physical Exam  Constitutional: He appears well-developed and well-nourished. No distress.  HENT:  Head: Normocephalic.  Mouth/Throat: Dental abscesses present.  Abscess of the gum above the right front tooth.  Eyes: Conjunctivae and EOM are normal.  Neck: Neck supple.  Cardiovascular: Normal rate.  Pulmonary/Chest: Effort normal.  Musculoskeletal: Normal range of motion.  Lymphadenopathy:    He has no cervical adenopathy.  Neurological: He is alert.  Skin: Skin is warm and dry.  Psychiatric: He has a normal mood and affect.  His behavior is normal.  Nursing note and vitals reviewed.    ED Treatments / Results  Labs (all labs ordered are listed, but only abnormal results are displayed) Labs Reviewed - No data to display Radiology No results found.  Procedures .Marland Kitchen.Incision and Drainage Date/Time: 07/19/2018 4:39 PM Performed by: Janne NapoleonNeese, Jennae Hakeem M, NP Authorized by: Janne NapoleonNeese, Tamyka Bezio M, NP   Consent:    Consent obtained:  Verbal   Consent given by:  Patient   Risks discussed:  Incomplete drainage   Alternatives discussed:  Alternative treatment Location:    Type:  Abscess   Location:  Mouth   Mouth location: gum above right front tooth. Anesthesia (see MAR for exact dosages):    Anesthesia method:  None Procedure type:    Complexity:  Simple Procedure details:    Needle aspiration: yes     Needle size:  22 G   Incision types:  Single straight   Incision depth:  Submucosal   Wound management:  Irrigated with saline   Drainage:  Purulent   Drainage amount:  Moderate   Wound treatment:  Wound left open Post-procedure details:    Patient tolerance of procedure:  Tolerated well, no immediate complications   (including critical care time)  Medications Ordered in ED Medications - No data to display   Initial Impression / Assessment and Plan / ED Course  I have reviewed the triage vital signs and the nursing notes. [age] y.o. male here with gum abscess stable for d/c after I&D. Will treat with Amoxicillin and patient will take tylenol or ibuprofen as needed. He will f/u with a dentist as soon as possible. Salt water rinses, return precautions.   Final Clinical Impressions(s) / ED Diagnoses   Final diagnoses:  Dental abscess    ED Discharge Orders         Ordered    amoxicillin (AMOXIL) 500 MG capsule  3 times daily     07/19/18 1640           Damian Leavelleese, EtonHope M, NP 07/19/18 1833    Charlynne PanderYao, David Hsienta, MD 07/19/18 [phone]

## 2018-07-19 NOTE — ED Triage Notes (Signed)
Pt reports dental pain and abscess that has been going on for 6 days. Pt has been taking medications at home with minimal relief. Pt was unable to pick up prescriptions d/t price. No fevers or oral trauma.

## 2018-07-19 NOTE — Discharge Instructions (Addendum)
See a dentist as soon as possible. °

## 2018-07-19 NOTE — ED Notes (Signed)
See EDP assessment. Pt verbalizes understanding of d/c instructions. Prescriptions reviewed with patient. Pt ambulatory at d/c with all belongings and with family.   

## 2018-08-27 ENCOUNTER — Emergency Department (HOSPITAL_COMMUNITY)
Admission: EM | Admit: 2018-08-27 | Discharge: 2018-08-27 | Disposition: A | Discharge status: Home / Self Care | Payer: Self-pay | Attending: Emergency Medicine | Admitting: Emergency Medicine

## 2018-08-27 ENCOUNTER — Inpatient Hospital Stay (HOSPITAL_COMMUNITY)
Admission: EM | Admit: 2018-08-27 | Discharge: 2018-08-28 | DRG: 639 | Disposition: A | Discharge status: Home / Self Care | Payer: Self-pay | Attending: Internal Medicine | Admitting: Internal Medicine

## 2018-08-27 ENCOUNTER — Encounter (HOSPITAL_COMMUNITY): Payer: Self-pay | Admitting: Emergency Medicine

## 2018-08-27 ENCOUNTER — Emergency Department (HOSPITAL_COMMUNITY): Payer: Self-pay

## 2018-08-27 ENCOUNTER — Other Ambulatory Visit: Payer: Self-pay

## 2018-08-27 ENCOUNTER — Encounter (HOSPITAL_COMMUNITY): Payer: Self-pay

## 2018-08-27 DIAGNOSIS — I10 Essential (primary) hypertension: Secondary | ICD-10-CM | POA: Insufficient documentation

## 2018-08-27 DIAGNOSIS — E1110 Type 2 diabetes mellitus with ketoacidosis without coma: Secondary | ICD-10-CM | POA: Insufficient documentation

## 2018-08-27 DIAGNOSIS — Z9114 Patient's other noncompliance with medication regimen: Secondary | ICD-10-CM

## 2018-08-27 DIAGNOSIS — J45909 Unspecified asthma, uncomplicated: Secondary | ICD-10-CM | POA: Diagnosis present

## 2018-08-27 DIAGNOSIS — F17210 Nicotine dependence, cigarettes, uncomplicated: Secondary | ICD-10-CM | POA: Insufficient documentation

## 2018-08-27 DIAGNOSIS — Z87891 Personal history of nicotine dependence: Secondary | ICD-10-CM

## 2018-08-27 DIAGNOSIS — Z791 Long term (current) use of non-steroidal anti-inflammatories (NSAID): Secondary | ICD-10-CM

## 2018-08-27 DIAGNOSIS — Z6825 Body mass index (BMI) 25.0-25.9, adult: Secondary | ICD-10-CM

## 2018-08-27 DIAGNOSIS — R739 Hyperglycemia, unspecified: Secondary | ICD-10-CM | POA: Diagnosis present

## 2018-08-27 DIAGNOSIS — E1310 Other specified diabetes mellitus with ketoacidosis without coma: Secondary | ICD-10-CM

## 2018-08-27 DIAGNOSIS — I4510 Unspecified right bundle-branch block: Secondary | ICD-10-CM | POA: Diagnosis present

## 2018-08-27 DIAGNOSIS — E1010 Type 1 diabetes mellitus with ketoacidosis without coma: Principal | ICD-10-CM | POA: Diagnosis present

## 2018-08-27 DIAGNOSIS — R079 Chest pain, unspecified: Secondary | ICD-10-CM | POA: Insufficient documentation

## 2018-08-27 DIAGNOSIS — Z794 Long term (current) use of insulin: Secondary | ICD-10-CM

## 2018-08-27 DIAGNOSIS — D649 Anemia, unspecified: Secondary | ICD-10-CM | POA: Diagnosis present

## 2018-08-27 LAB — BASIC METABOLIC PANEL
ANION GAP: 26 — AB (ref 5–15)
Anion gap: 20 — ABNORMAL HIGH (ref 5–15)
BUN: 14 mg/dL (ref 6–20)
BUN: 12 mg/dL (ref 6–20)
CALCIUM: 10.1 mg/dL (ref 8.9–10.3)
CHLORIDE: 110 mmol/L (ref 98–111)
CO2: 7 mmol/L — ABNORMAL LOW (ref 22–32)
CO2: 9 mmol/L — AB (ref 22–32)
CREATININE: 1.13 mg/dL (ref 0.61–1.24)
Calcium: 9.3 mg/dL (ref 8.9–10.3)
Chloride: 107 mmol/L (ref 98–111)
Creatinine, Ser: 1.39 mg/dL — ABNORMAL HIGH (ref 0.61–1.24)
GFR calc Af Amer: >60 mL/min (ref >60)
GFR calc non Af Amer: >60 mL/min (ref >60)
GLUCOSE: 517 mg/dL — AB (ref 70–99)
GLUCOSE: 321 mg/dL — AB (ref 70–99)
POTASSIUM: 5 mmol/L (ref 3.5–5.1)
POTASSIUM: 4.6 mmol/L (ref 3.5–5.1)
SODIUM: 140 mmol/L (ref 135–145)
Sodium: 139 mmol/L (ref 135–145)

## 2018-08-27 LAB — I-STAT CHEM 8, ED
BUN: 17 mg/dL (ref 6–20)
CREATININE: 0.7 mg/dL (ref 0.61–1.24)
Calcium, Ion: 1.25 mmol/L (ref 1.15–1.40)
Chloride: 111 mmol/L (ref 98–111)
Glucose, Bld: 543 mg/dL (ref 70–99)
HEMATOCRIT: 60 % — AB (ref 39.0–52.0)
HEMOGLOBIN: 20.4 g/dL — AB (ref 13.0–17.0)
POTASSIUM: 4.9 mmol/L (ref 3.5–5.1)
Sodium: 140 mmol/L (ref 135–145)
TCO2: 10 mmol/L — ABNORMAL LOW (ref 22–32)

## 2018-08-27 LAB — RAPID URINE DRUG SCREEN, HOSP PERFORMED
Amphetamines: NOT DETECTED
BARBITURATES: NOT DETECTED
BENZODIAZEPINES: NOT DETECTED
COCAINE: NOT DETECTED
OPIATES: NOT DETECTED
TETRAHYDROCANNABINOL: POSITIVE — AB

## 2018-08-27 LAB — I-STAT TROPONIN, ED
Troponin i, poc: 0 ng/mL (ref 0.00–0.08)
Troponin i, poc: 0 ng/mL (ref 0.00–0.08)

## 2018-08-27 LAB — URINALYSIS, ROUTINE W REFLEX MICROSCOPIC
BACTERIA UA: NONE SEEN
Bilirubin Urine: NEGATIVE
Glucose, UA: >=500 mg/dL — AB
Hgb urine dipstick: NEGATIVE
Ketones, ur: 80 mg/dL — AB
Leukocytes, UA: NEGATIVE
Nitrite: NEGATIVE
PH: 5 (ref 5.0–8.0)
Protein, ur: NEGATIVE mg/dL
SPECIFIC GRAVITY, URINE: 1.017 (ref 1.005–1.030)

## 2018-08-27 LAB — CBG MONITORING, ED
GLUCOSE-CAPILLARY: 485 mg/dL — AB (ref 70–99)
GLUCOSE-CAPILLARY: 323 mg/dL — AB (ref 70–99)
GLUCOSE-CAPILLARY: 153 mg/dL — AB (ref 70–99)
Glucose-Capillary: 531 mg/dL (ref 70–99)
Glucose-Capillary: 435 mg/dL — ABNORMAL HIGH (ref 70–99)
Glucose-Capillary: 468 mg/dL — ABNORMAL HIGH (ref 70–99)
Glucose-Capillary: 247 mg/dL — ABNORMAL HIGH (ref 70–99)
Glucose-Capillary: 188 mg/dL — ABNORMAL HIGH (ref 70–99)
Glucose-Capillary: 156 mg/dL — ABNORMAL HIGH (ref 70–99)

## 2018-08-27 LAB — CBC WITH DIFFERENTIAL/PLATELET
ABS IMMATURE GRANULOCYTES: 0.1 10*3/uL — AB (ref 0.00–0.07)
BASOS ABS: 0.1 10*3/uL (ref 0.0–0.1)
Basophils Relative: 1 %
Eosinophils Absolute: 0 10*3/uL (ref 0.0–0.5)
Eosinophils Relative: 0 %
HCT: 57.3 % — ABNORMAL HIGH (ref 39.0–52.0)
Hemoglobin: 18.3 g/dL — ABNORMAL HIGH (ref 13.0–17.0)
Immature Granulocytes: 1 %
Lymphocytes Relative: 10 %
Lymphs Abs: 1.7 10*3/uL (ref 0.7–4.0)
MCH: 30 pg (ref 26.0–34.0)
MCHC: 31.9 g/dL (ref 30.0–36.0)
MCV: 94.1 fL (ref 80.0–100.0)
Monocytes Absolute: 0.7 10*3/uL (ref 0.1–1.0)
Monocytes Relative: 4 %
NEUTROS ABS: 14.2 10*3/uL — AB (ref 1.7–7.7)
NEUTROS PCT: 84 %
PLATELETS: 398 10*3/uL (ref 150–400)
RBC: 6.09 MIL/uL — AB (ref 4.22–5.81)
RDW: 14.1 % (ref 11.5–15.5)
WBC: 16.9 10*3/uL — AB (ref 4.0–10.5)
nRBC: 0 % (ref 0.0–0.2)

## 2018-08-27 LAB — CBC
HEMATOCRIT: 53.7 % — AB (ref 39.0–52.0)
HEMOGLOBIN: 17.8 g/dL — AB (ref 13.0–17.0)
MCH: 30.9 pg (ref 26.0–34.0)
MCHC: 33.1 g/dL (ref 30.0–36.0)
MCV: 93.2 fL (ref 80.0–100.0)
Platelets: 319 10*3/uL (ref 150–400)
RBC: 5.76 MIL/uL (ref 4.22–5.81)
RDW: 13.8 % (ref 11.5–15.5)
WBC: 11.7 10*3/uL — ABNORMAL HIGH (ref 4.0–10.5)
nRBC: 0 % (ref 0.0–0.2)

## 2018-08-27 LAB — HEPATIC FUNCTION PANEL
ALT: 15 U/L (ref 0–44)
AST: 35 U/L (ref 15–41)
Albumin: 4.2 g/dL (ref 3.5–5.0)
Alkaline Phosphatase: 117 U/L (ref 38–126)
BILIRUBIN DIRECT: 0.8 mg/dL — AB (ref 0.0–0.2)
BILIRUBIN INDIRECT: 2.4 mg/dL — AB (ref 0.3–0.9)
BILIRUBIN TOTAL: 3.2 mg/dL — AB (ref 0.3–1.2)
Total Protein: 7.6 g/dL (ref 6.5–8.1)

## 2018-08-27 LAB — I-STAT VENOUS BLOOD GAS, ED
ACID-BASE DEFICIT: 16 mmol/L — AB (ref 0.0–2.0)
BICARBONATE: 12.4 mmol/L — AB (ref 20.0–28.0)
O2 SAT: 50 %
PO2 VEN: 34 mmHg (ref 32.0–45.0)
TCO2: 13 mmol/L — AB (ref 22–32)
pCO2, Ven: 35.9 mmHg — ABNORMAL LOW (ref 44.0–60.0)
pH, Ven: 7.145 — CL (ref 7.250–7.430)

## 2018-08-27 LAB — LIPASE, BLOOD: Lipase: 23 U/L (ref 11–51)

## 2018-08-27 MED ORDER — ENOXAPARIN SODIUM 40 MG/0.4ML ~~LOC~~ SOLN
40.0000 mg | Freq: Every day | SUBCUTANEOUS | Status: DC
  Filled 2018-08-27: qty 0.4

## 2018-08-27 MED ORDER — SODIUM CHLORIDE 0.9 % IV BOLUS
1000.0000 mL | Freq: Once | INTRAVENOUS | Status: AC
  Administered 2018-08-27: 1000 mL via INTRAVENOUS

## 2018-08-27 MED ORDER — POTASSIUM CHLORIDE 10 MEQ/100ML IV SOLN
10.0000 meq | INTRAVENOUS | Status: AC
  Administered 2018-08-27 (×2): 10 meq via INTRAVENOUS
  Filled 2018-08-27 (×2): qty 100

## 2018-08-27 MED ORDER — FAMOTIDINE 20 MG PO TABS
20.0000 mg | ORAL_TABLET | Freq: Once | ORAL | Status: AC
  Administered 2018-08-27: 20 mg via ORAL
  Filled 2018-08-27: qty 1

## 2018-08-27 MED ORDER — SODIUM CHLORIDE 0.9 % IV SOLN
INTRAVENOUS | Status: DC
  Administered 2018-08-27: 21:00:00 via INTRAVENOUS

## 2018-08-27 MED ORDER — DEXTROSE-NACL 5-0.45 % IV SOLN: INTRAVENOUS | Status: DC

## 2018-08-27 MED ORDER — SODIUM CHLORIDE 0.9 % IV SOLN
INTRAVENOUS | Status: DC
  Administered 2018-08-27: 22:00:00 via INTRAVENOUS

## 2018-08-27 MED ORDER — DEXTROSE-NACL 5-0.45 % IV SOLN
INTRAVENOUS | Status: DC
  Administered 2018-08-27: 20:00:00 via INTRAVENOUS

## 2018-08-27 MED ORDER — INSULIN REGULAR(HUMAN) IN NACL 100-0.9 UT/100ML-% IV SOLN
INTRAVENOUS | Status: DC
  Administered 2018-08-27: 1.9 [IU]/h via INTRAVENOUS
  Filled 2018-08-27: qty 100

## 2018-08-27 MED ORDER — INSULIN REGULAR(HUMAN) IN NACL 100-0.9 UT/100ML-% IV SOLN
INTRAVENOUS | Status: DC
  Administered 2018-08-27: 4.3 [IU]/h via INTRAVENOUS
  Filled 2018-08-27: qty 100

## 2018-08-27 MED ORDER — INSULIN REGULAR(HUMAN) IN NACL 100-0.9 UT/100ML-% IV SOLN
INTRAVENOUS | Status: DC
  Administered 2018-08-27: 2.6 [IU]/h via INTRAVENOUS

## 2018-08-27 MED ORDER — DEXTROSE-NACL 5-0.45 % IV SOLN
INTRAVENOUS | Status: DC
  Administered 2018-08-27: 22:00:00 via INTRAVENOUS

## 2018-08-27 MED ORDER — SODIUM CHLORIDE 0.9 % IV SOLN
INTRAVENOUS | Status: DC
  Administered 2018-08-27: 14:00:00 via INTRAVENOUS

## 2018-08-27 MED ORDER — POTASSIUM CHLORIDE 10 MEQ/100ML IV SOLN
10.0000 meq | INTRAVENOUS | Status: AC
  Administered 2018-08-27: 10 meq via INTRAVENOUS
  Filled 2018-08-27: qty 100

## 2018-08-27 MED ORDER — POTASSIUM CHLORIDE 10 MEQ/100ML IV SOLN: 10.0000 meq | INTRAVENOUS | Status: DC

## 2018-08-27 NOTE — ED Notes (Signed)
Pt on 3L [place] en route

## 2018-08-27 NOTE — ED Triage Notes (Signed)
Pt is a type 2 diabetic with hx of DKA.  Pt presents with tachycardia, tachypnea , chest pain, abd pain and N/V since last night.  CBG upon arrival is 531

## 2018-08-27 NOTE — ED Notes (Addendum)
Pt states they want to leave AMA. PA Couture notified. PA at bedside

## 2018-08-27 NOTE — ED Provider Notes (Signed)
[facility] EMERGENCY DEPARTMENT Provider Note   CSN: [id number] Arrival date & time: 08/27/18  1018   History   Chief Complaint Chief Complaint  Patient presents with  . Diabetic Ketoacidosis    HPI [name] is a 27 y.o. male.  HPI   Patient is a 27 year old male with a history of asthma, insulin-dependent diabetes, DKA, who presents emergency department today for evaluation of suspected diabetic ketoacidosis.  Patient states symptoms feel similar to when he has had DKA in the past.  This morning he woke up around 4 AM with chest pain, diffuse abdominal pain, nausea and vomiting.  Denies any fevers, chills, dysuria, urgency.  He does report increased urinary frequency and increased thirst.  He has been constipated for the last several days.  Last BM was 2 days ago.  Reports being compliant with his insulin.  States he took it this morning.  Denies URI symptoms or other infectious symptoms.  Reviewed records.  Patient has had admissions in the past for DKA due to being noncompliant with his insulin.  Per care everywhere records He was last seen 05/30/2018 [facility] for DKA and left AMA.  Past Medical History:  Diagnosis Date  . Asthma   . DKA (diabetic ketoacidoses) (HCC) 06/2017; 10/17/2017  . DKA (diabetic ketoacidoses) (HCC) 02/2018  . Hyperglycemia 09/24/2017  . Obesity   . Type II diabetes mellitus (HCC)    "dx'd 06/2017"    Patient Active Problem List   Diagnosis Date Noted  . Hypokalemia 03/18/2018  . Normocytic anemia 03/18/2018  . Hyperglycemia   . CAP (community acquired pneumonia) 03/17/2018  . DKA (diabetic ketoacidoses) (HCC) 03/06/2018  . Right bundle branch block (RBBB) determined by electrocardiography 08/24/2017  . Type 2 diabetes mellitus (HCC) 08/01/2017  . Smoking 08/01/2017  . HTN (hypertension) 07/25/2017    Past Surgical History:  Procedure Laterality Date  . LACERATION REPAIR Left    "stitched finger up"  .  TONSILLECTOMY          Home Medications    Prior to Admission medications   Medication Sig Start Date End Date Taking? Authorizing Provider  insulin aspart protamine - aspart (NOVOLOG MIX 70/30 FLEXPEN) (70-30) 100 UNIT/ML FlexPen Inject 0.65 mLs (65 Units total) into the skin 2 (two) times daily. IM Program Patient taking differently: Inject 10 Units into the skin 2 (two) times daily. IM Program 03/09/18  Yes Lacroce, Ames Coupe, MD  Insulin Pen Needle 32G X 8 MM MISC 65 Units by Does not apply route 2 (two) times daily. IM Program 03/09/18   Toney Rakes, MD  lisinopril (PRINIVIL,ZESTRIL) 40 MG tablet Take 1 tablet (40 mg total) by mouth daily. IM Program Patient not taking: Reported on 04/09/2018 03/09/18   Toney Rakes, MD  metFORMIN (GLUCOPHAGE) 1000 MG tablet Take 1 tablet (1,000 mg total) by mouth 2 (two) times daily with a meal. Patient not taking: Reported on 08/27/2018 01/01/18   Beola Cord, MD  naproxen (NAPROSYN) 500 MG tablet Take 1 tablet (500 mg total) by mouth 2 (two) times daily. Patient not taking: Reported on 08/27/2018 07/17/18   Liberty Handy, PA-C    Family History History reviewed. No pertinent family history.  Social History Social History   Tobacco Use  . Smoking status: Current Every Day Smoker    Packs/day: 0.10    Years: 12.00    Pack years: 1.20    Types: Cigarettes  . Smokeless tobacco: Never Used  . Tobacco  comment: cutting back - now 1-2 cigarettes per day  Substance Use Topics  . Alcohol use: Yes    Comment: 10/17/2017 "2 shots of liquor/month; maybe"  . Drug use: Yes    Types: Marijuana    Comment: 10/17/2017 ~3 times per week     Allergies   Vicodin [hydrocodone-acetaminophen]; Eggs or egg-derived products; and Lactose intolerance (gi)   Review of Systems Review of Systems  Constitutional: Negative for chills and fever.  HENT: Negative for congestion, ear pain and sore throat.   Eyes: Negative for pain and  visual disturbance.  Respiratory: Positive for shortness of breath (resolved). Negative for cough.   Cardiovascular: Positive for chest pain. Negative for palpitations and leg swelling.  Gastrointestinal: Positive for abdominal pain, constipation, nausea and vomiting. Negative for diarrhea.  Endocrine: Positive for polydipsia and polyuria.  Genitourinary: Positive for frequency. Negative for dysuria, flank pain, hematuria and urgency.  Musculoskeletal: Negative for back pain.  Skin: Negative for rash.  Neurological: Negative for headaches.  All other systems reviewed and are negative.    Physical Exam Updated Vital Signs BP (!) 145/99   Pulse (!) 111   Temp 98.2 F (36.8 C) (Oral)   Resp 15   Ht 5\' 6"  (1.676 m)   Wt 74.8 kg   SpO2 98%   BMI 26.63 kg/m   Physical Exam  Constitutional: He is oriented to person, place, and time. He appears well-developed and well-nourished.  HENT:  Head: Normocephalic and atraumatic.  Mucous membranes dry, lips dry  Eyes: Conjunctivae are normal.  Neck: Neck supple.  Cardiovascular: Regular rhythm and normal heart sounds.  No murmur heard. tachycardic  Pulmonary/Chest: Effort normal and breath sounds normal. No stridor. No respiratory distress. He has no wheezes.  Abdominal: Soft. Bowel sounds are normal.  No focal abd TTP. No rigidity or guarding.  Musculoskeletal: He exhibits no edema.  Neurological: He is alert and oriented to person, place, and time.  Moving all extremities, answering questions appropriately  Skin: Skin is warm and dry.  Psychiatric: He has a normal mood and affect.  Nursing note and vitals reviewed.  ED Treatments / Results  Labs (all labs ordered are listed, but only abnormal results are displayed) Labs Reviewed  BASIC METABOLIC PANEL - Abnormal; Notable for the following components:      Result Value   CO2 7 (*)    Glucose, Bld 517 (*)    Creatinine, Ser 1.39 (*)    Anion gap 26 (*)    All other components  within normal limits  CBC WITH DIFFERENTIAL/PLATELET - Abnormal; Notable for the following components:   WBC 16.9 (*)    RBC 6.09 (*)    Hemoglobin 18.3 (*)    HCT 57.3 (*)    Neutro Abs 14.2 (*)    Abs Immature Granulocytes 0.10 (*)    All other components within normal limits  HEPATIC FUNCTION PANEL - Abnormal; Notable for the following components:   Total Bilirubin 3.2 (*)    Bilirubin, Direct 0.8 (*)    Indirect Bilirubin 2.4 (*)    All other components within normal limits  CBG MONITORING, ED - Abnormal; Notable for the following components:   Glucose-Capillary 531 (*)    All other components within normal limits  I-STAT CHEM 8, ED - Abnormal; Notable for the following components:   Glucose, Bld 543 (*)    TCO2 10 (*)    Hemoglobin 20.4 (*)    HCT 60.0 (*)    All  other components within normal limits  CBG MONITORING, ED - Abnormal; Notable for the following components:   Glucose-Capillary 485 (*)    All other components within normal limits  CBG MONITORING, ED - Abnormal; Notable for the following components:   Glucose-Capillary 435 (*)    All other components within normal limits  CBG MONITORING, ED - Abnormal; Notable for the following components:   Glucose-Capillary 468 (*)    All other components within normal limits  I-STAT VENOUS BLOOD GAS, ED - Abnormal; Notable for the following components:   pH, Ven 7.145 (*)    pCO2, Ven 35.9 (*)    Bicarbonate 12.4 (*)    TCO2 13 (*)    Acid-base deficit 16.0 (*)    All other components within normal limits  CBG MONITORING, ED - Abnormal; Notable for the following components:   Glucose-Capillary 323 (*)    All other components within normal limits  LIPASE, BLOOD  I-STAT TROPONIN, ED    EKG EKG Interpretation  Date/Time:  Tuesday August 27 2018 10:25:57 EDT Ventricular Rate:  134 PR Interval:  112 QRS Duration: 114 QT Interval:  330 QTC Calculation: 492 R Axis:   -98 Text Interpretation:  Sinus tachycardia Right  superior axis deviation Right ventricular hypertrophy Minimal voltage criteria for LVH, may be normal variant Abnormal ECG When compaerd to prior, faster rate.  No STEMI Confirmed by Theda Belfast ([id number]) on 08/27/2018 12:29:26 PM   Radiology Dg Chest 1 View  Result Date: 08/27/2018 CLINICAL DATA:  Chest pain. EXAM: CHEST  1 VIEW COMPARISON:  Radiographs of April 09, 2018. FINDINGS: The heart size and mediastinal contours are within normal limits. Both lungs are clear. No pneumothorax or pleural effusion is noted. The visualized skeletal structures are unremarkable. IMPRESSION: No active disease. Electronically Signed   By: Lupita Raider, M.D.   On: 08/27/2018 13:16    Procedures Procedures (including critical care time) CRITICAL CARE Performed by: Karrie Meres   Total critical care time: 39 minutes  Critical care time was exclusive of separately billable procedures and treating other patients.  Critical care was necessary to treat or prevent imminent or life-threatening deterioration.  Critical care was time spent personally by me on the following activities: development of treatment plan with patient and/or surrogate as well as nursing, discussions with consultants, evaluation of patient's response to treatment, examination of patient, obtaining history from patient or surrogate, ordering and performing treatments and interventions, ordering and review of laboratory studies, ordering and review of radiographic studies, pulse oximetry and re-evaluation of patient's condition.   Medications Ordered in ED Medications  potassium chloride 10 mEq in 100 mL IVPB (0 mEq Intravenous Stopped 08/27/18 1449)  sodium chloride 0.9 % bolus 1,000 mL (0 mLs Intravenous Stopped 08/27/18 1449)  famotidine (PEPCID) tablet 20 mg (20 mg Oral Given 08/27/18 1355)     Initial Impression / Assessment and Plan / ED Course  I have reviewed the triage vital signs and the nursing notes.  Pertinent  labs & imaging results that were available during my care of the patient were reviewed by me and considered in my medical decision making (see chart for details).    Discussed pt presentation and exam findings with Dr. Rush Landmark, who agrees with the plan for admission.  Final Clinical Impressions(s) / ED Diagnoses   Final diagnoses:  Chest pain  Diabetic ketoacidosis without coma associated with other specified diabetes mellitus (HCC)   Pt presenting with signs and symptoms consistent with DKA.  Reports compliance with insulin at home but has a h/o noncompliance per chart review. Denies infectious sxs at this time. No fevers today. Tachycardic and slightly tachypneic. Normal BP and SPO2.  CBC significant for leukocytosis to 16.9 with left shift.  Hbg/hct elevated, suspect hemoconcentration due to dehydration.   BMP with bicarb of 7, glucose 517, elevated creatinine 1.39 and elevated anion gap at 26. Lipase WNL Hepatic function with normal AST/ALT. Elevated total bilirubin, increased from prior. Trop negative. VBG with pH of 7.1 with pCO2 of 35. UA and UDS are currently pending at this time.   CXR negative for infiltrate, PTX, or other abnormality EKG with sinus tach, RAD, RVH, minimal voltage criteria for LVH, faster rate compared to prior.   1:48 PM reevaluated patient.  Discussed the findings of his work-up and the plan for admission for further tx of DKA.  He states that his abdominal pain is somewhat improved after receiving insulin.  Is complaining of symptoms of acid reflux.  Heart rate is 115/19 on the monitor.  O2 sats 95% and above on room air.  He has had no episodes of vomiting.   2:31 PM Discussed case with Dr. Joelene Millin who accepts the patient for admission.  Update from Dr. Joelene Millin who stated that the patient would like to leave AMA.  I reevaluated the patient. We discussed the nature and purpose, risks and benefits, as well as, the alternatives of treatment. Time was given to  allow the opportunity to ask questions and consider their options, and after the discussion, the patient decided to refuse the offerred treatment. The patient was informed that refusal could lead to, but was not limited to, death, permanent disability, or severe pain. Prior to refusing, I determined that the patient had the capacity to make their decision and understood the consequences of that decision. After refusal, I made every reasonable opportunity to treat them to the best of my ability.  The patient was notified that they may return to the emergency department at any time for further treatment.    ED Discharge Orders    None       Rayne Du 08/27/18 1928    Tegeler, Canary Brim, MD 08/27/18 [phone]

## 2018-08-27 NOTE — ED Provider Notes (Signed)
[facility] EMERGENCY DEPARTMENT Provider Note   CSN: [id number] Arrival date & time: 08/27/18  1802     History   Chief Complaint Chief Complaint  Patient presents with  . Chest Pain  . Hyperglycemia    HPI [name] is a 27 y.o. male.  HPI   Patient is a 27 year old male with history of asthma, diabetes, DKA, presents emergency department today for evaluation of hyperglycemia and chest pain.  Patient seen in the ED earlier today by myself and was diagnosed with DKA.  He later left AMA.  He is presenting again with continued symptoms of diffuse abdominal pain and nausea.  He has had no episodes of vomiting since he left.  Reports he continues to have chest pain and shortness of breath consistent with sxs he has had with DKA in the past.  He now admits that he has not been compliant with his insulin at home.  Denies any infectious symptoms.  Denies risk factors for PE.  He is now requesting admission for further treatment of his DKA.  Past Medical History:  Diagnosis Date  . Asthma   . DKA (diabetic ketoacidoses) (HCC) 06/2017; 10/17/2017  . DKA (diabetic ketoacidoses) (HCC) 02/2018  . Hyperglycemia 09/24/2017  . Obesity   . Type II diabetes mellitus (HCC)    "dx'd 06/2017"    Patient Active Problem List   Diagnosis Date Noted  . Hypokalemia 03/18/2018  . Normocytic anemia 03/18/2018  . Hyperglycemia   . CAP (community acquired pneumonia) 03/17/2018  . DKA (diabetic ketoacidoses) (HCC) 03/06/2018  . Right bundle branch block (RBBB) determined by electrocardiography 08/24/2017  . Type 2 diabetes mellitus (HCC) 08/01/2017  . Smoking 08/01/2017  . HTN (hypertension) 07/25/2017    Past Surgical History:  Procedure Laterality Date  . LACERATION REPAIR Left    "stitched finger up"  . TONSILLECTOMY          Home Medications    Prior to Admission medications   Medication Sig Start Date End Date Taking? Authorizing Provider  insulin  aspart protamine - aspart (NOVOLOG MIX 70/30 FLEXPEN) (70-30) 100 UNIT/ML FlexPen Inject 0.65 mLs (65 Units total) into the skin 2 (two) times daily. IM Program Patient taking differently: Inject 10-26 Units into the skin 2 (two) times daily after a meal. IM Program 03/09/18  Yes Lacroce, Ames Coupe, MD  naproxen sodium (ALEVE) 220 MG tablet Take 220-440 mg by mouth 2 (two) times daily as needed (for pain or headaches).    Yes [provider]  Insulin Pen Needle 32G X 8 MM MISC 65 Units by Does not apply route 2 (two) times daily. IM Program 03/09/18   Toney Rakes, MD  lisinopril (PRINIVIL,ZESTRIL) 40 MG tablet Take 1 tablet (40 mg total) by mouth daily. IM Program Patient not taking: Reported on 08/27/2018 03/09/18   Toney Rakes, MD  metFORMIN (GLUCOPHAGE) 1000 MG tablet Take 1 tablet (1,000 mg total) by mouth 2 (two) times daily with a meal. Patient not taking: Reported on 08/27/2018 01/01/18   Beola Cord, MD  naproxen (NAPROSYN) 500 MG tablet Take 1 tablet (500 mg total) by mouth 2 (two) times daily. Patient not taking: Reported on 08/27/2018 07/17/18   Liberty Handy, PA-C    Family History History reviewed. No pertinent family history.  Social History Social History   Tobacco Use  . Smoking status: Current Every Day Smoker    Packs/day: 0.10    Years: 12.00    Pack years:  1.20    Types: Cigarettes  . Smokeless tobacco: Never Used  . Tobacco comment: cutting back - now 1-2 cigarettes per day  Substance Use Topics  . Alcohol use: Yes    Comment: 10/17/2017 "2 shots of liquor/month; maybe"  . Drug use: Yes    Types: Marijuana    Comment: 10/17/2017 ~3 times per week     Allergies   Vicodin [hydrocodone-acetaminophen]; Eggs or egg-derived products; and Lactose intolerance (gi)   Review of Systems Review of Systems  Constitutional: Negative for chills and fever.  HENT: Negative for ear pain and sore throat.   Eyes: Negative for pain and visual  disturbance.  Respiratory: Positive for shortness of breath. Negative for cough.   Cardiovascular: Positive for chest pain. Negative for leg swelling.  Gastrointestinal: Positive for abdominal pain and nausea. Negative for constipation, diarrhea and vomiting.  Genitourinary: Negative for dysuria and hematuria.  Musculoskeletal: Negative for back pain.  Skin: Negative for color change and rash.  Neurological: Negative for headaches.  All other systems reviewed and are negative.    Physical Exam Updated Vital Signs BP 134/89   Pulse 95   Temp 98.6 F (37 C) (Oral)   Resp 14   SpO2 100%   Physical Exam  Constitutional: He appears well-developed and well-nourished.  Appears uncomfortable  HENT:  Head: Normocephalic and atraumatic.  Eyes: Conjunctivae are normal.  Neck: Neck supple.  Cardiovascular: Normal rate and regular rhythm.  No murmur heard. Pulmonary/Chest: Effort normal and breath sounds normal. No respiratory distress. He has no decreased breath sounds. He has no wheezes. He has no rhonchi. He has no rales.  Abdominal: Soft. Bowel sounds are normal.  Diffuse TTP. No rebound, rigidity, or guarding  Musculoskeletal: He exhibits no edema.       Right lower leg: Normal. He exhibits no tenderness and no edema.       Left lower leg: Normal. He exhibits no tenderness and no edema.  Neurological: He is alert.  Answering questions appropriately, moving all extremities  Skin: Skin is warm and dry.  Psychiatric: He has a normal mood and affect.  Nursing note and vitals reviewed.   ED Treatments / Results  Labs (all labs ordered are listed, but only abnormal results are displayed) Labs Reviewed  CBC - Abnormal; Notable for the following components:      Result Value   WBC 11.7 (*)    Hemoglobin 17.8 (*)    HCT 53.7 (*)    All other components within normal limits  BASIC METABOLIC PANEL - Abnormal; Notable for the following components:   CO2 9 (*)    Glucose, Bld 321  (*)    Anion gap 20 (*)    All other components within normal limits  CBG MONITORING, ED - Abnormal; Notable for the following components:   Glucose-Capillary 247 (*)    All other components within normal limits  CBG MONITORING, ED - Abnormal; Notable for the following components:   Glucose-Capillary 188 (*)    All other components within normal limits  URINALYSIS, ROUTINE W REFLEX MICROSCOPIC  RAPID URINE DRUG SCREEN, HOSP PERFORMED  HIV ANTIBODY (ROUTINE TESTING W REFLEX)  BASIC METABOLIC PANEL  BASIC METABOLIC PANEL  BASIC METABOLIC PANEL  BASIC METABOLIC PANEL  BETA-HYDROXYBUTYRIC ACID  I-STAT TROPONIN, ED    EKG EKG Interpretation  Date/Time:  Tuesday August 27 2018 18:07:29 EDT Ventricular Rate:  117 PR Interval:  120 QRS Duration: 122 QT Interval:  350 QTC Calculation: 488 R  Axis:   -85 Text Interpretation:  Sinus tachycardia Right bundle branch block Left anterior fascicular block Abnormal ECG When compared to priorm no significant changes.  No STEMI Confirmed by Theda Belfast ([id number]) on 08/27/2018 7:04:08 PM   Radiology Dg Chest 1 View  Result Date: 08/27/2018 CLINICAL DATA:  Chest pain. EXAM: CHEST  1 VIEW COMPARISON:  Radiographs of April 09, 2018. FINDINGS: The heart size and mediastinal contours are within normal limits. Both lungs are clear. No pneumothorax or pleural effusion is noted. The visualized skeletal structures are unremarkable. IMPRESSION: No active disease. Electronically Signed   By: Lupita Raider, M.D.   On: 08/27/2018 13:16    Procedures Procedures (including critical care time) CRITICAL CARE Performed by: Karrie Meres   Total critical care time: 35 minutes  Critical care time was exclusive of separately billable procedures and treating other patients.  Critical care was necessary to treat or prevent imminent or life-threatening deterioration.  Critical care was time spent personally by me on the following activities: development  of treatment plan with patient and/or surrogate as well as nursing, discussions with consultants, evaluation of patient's response to treatment, examination of patient, obtaining history from patient or surrogate, ordering and performing treatments and interventions, ordering and review of laboratory studies, ordering and review of radiographic studies, pulse oximetry and re-evaluation of patient's condition.   Medications Ordered in ED Medications  sodium chloride 0.9 % bolus 1,000 mL (0 mLs Intravenous Stopped 08/27/18 2109)    And  0.9 %  sodium chloride infusion ( Intravenous New Bag/Given 08/27/18 2103)  dextrose 5 %-0.45 % sodium chloride infusion ( Intravenous New Bag/Given 08/27/18 2008)  potassium chloride 10 mEq in 100 mL IVPB (10 mEq Intravenous New Bag/Given 08/27/18 2102)  0.9 %  sodium chloride infusion (has no administration in time range)  dextrose 5 %-0.45 % sodium chloride infusion (has no administration in time range)  insulin regular, human (MYXREDLIN) 100 units/ 100 mL infusion (has no administration in time range)  enoxaparin (LOVENOX) injection 40 mg (has no administration in time range)     Initial Impression / Assessment and Plan / ED Course  I have reviewed the triage vital signs and the nursing notes.  Pertinent labs & imaging results that were available during my care of the patient were reviewed by me and considered in my medical decision making (see chart for details).  Case discussed with Dr. Rush Landmark who agrees with plan for admission.  Final Clinical Impressions(s) / ED Diagnoses   Final diagnoses:  Diabetic ketoacidosis without coma associated with type 2 diabetes mellitus [facility])   Patient presenting for evaluation of hyperglycemia and chest pain.  Seen in the ED earlier today and diagnosed with DKA.  He chose to leave AMA.  Repeat labs completed and patient continues to be in DKA with elevated blood sugar, elevated anion gap, low bicarb.  UA is currently  pending at this time.  Patient has no signs or symptoms of an infectious etiology at this time.  EKG with sinus tachycardia, unchanged from earlier today.  Troponin remains negative.  Will admit the patient to the hospitalist service for further treatment of his DKA.  9:15 PM discussed patient case with Dr. Julian Reil who accepted patient for admission.  ED Discharge Orders    None       Rayne Du 08/27/18 2123    Tegeler, Canary Brim, MD 08/27/18 [phone]

## 2018-08-27 NOTE — Discharge Instructions (Addendum)
Please follow-up with your regular doctor later this week for reevaluation.  Please continue taking her insulin at home.  Please make sure to stay well-hydrated.  It was recommended that you be admitted to the hospital today for further treatment, however you chose to leave AGAINST MEDICAL ADVICE.  If you continue to have symptoms such as chest pain, shortness of breath, abdominal pain, nausea vomiting or any other symptoms then please do not hesitate to return to the emergency department for further treatment immediately.

## 2018-08-27 NOTE — H&P (Signed)
History and Physical    [name] ZOX:[id number] DOB: [birth year] DOA: 08/27/2018  PCP: Patient, No Pcp Per  Patient coming from: Home  I have personally briefly reviewed patient's old medical records in [facility]  Chief Complaint: CP, hyperglycemia  HPI: [name] is a 27 y.o. male with medical history significant of DM1, asthma.  Patient presents to the ED today for evaluation of hyperglycemia and chest pain.  Apparently ate expired meat yesterday, abd cramps, nausea, vomiting today, unable to eat anything.  No fever nor chills.  Took 10u insulin this AM.  Seen earlier this afternoon, diagnosed with DKA, my colleague Dr. Elvera Lennox tried to admit him but he refused and left AMA.  Patient now returns to ED feeling worse.  ED Course: labs show AG 20 down from 26 earlier today.  PH 7.1 earlier this afternoon.  Given IVF, insulin gtt, willing to be admitted at this time.   Review of Systems: As per HPI otherwise 10 point review of systems negative.   Past Medical History:  Diagnosis Date  . Asthma   . DKA (diabetic ketoacidoses) (HCC) 06/2017; 10/17/2017  . DKA (diabetic ketoacidoses) (HCC) 02/2018  . Hyperglycemia 09/24/2017  . Obesity   . Type II diabetes mellitus (HCC)    "dx'd 06/2017"    Past Surgical History:  Procedure Laterality Date  . LACERATION REPAIR Left    "stitched finger up"  . TONSILLECTOMY       reports that he has been smoking cigarettes. He has a 1.20 pack-year smoking history. He has never used smokeless tobacco. He reports that he drinks alcohol. He reports that he has current or past drug history. Drug: Marijuana.  Allergies  Allergen Reactions  . Vicodin [Hydrocodone-Acetaminophen] Anaphylaxis    Tolerates tylenol with no allergy  . Eggs Or Egg-Derived Products Nausea And Vomiting  . Lactose Intolerance (Gi) Rash    History reviewed. No pertinent family history.   Prior to Admission medications   Medication Sig Start Date  End Date Taking? Authorizing Provider  insulin aspart protamine - aspart (NOVOLOG MIX 70/30 FLEXPEN) (70-30) 100 UNIT/ML FlexPen Inject 0.65 mLs (65 Units total) into the skin 2 (two) times daily. IM Program Patient taking differently: Inject 10-26 Units into the skin 2 (two) times daily after a meal. IM Program 03/09/18  Yes Lacroce, Ames Coupe, MD  naproxen sodium (ALEVE) 220 MG tablet Take 220-440 mg by mouth 2 (two) times daily as needed (for pain or headaches).    Yes [provider]  Insulin Pen Needle 32G X 8 MM MISC 65 Units by Does not apply route 2 (two) times daily. IM Program 03/09/18   Toney Rakes, MD  lisinopril (PRINIVIL,ZESTRIL) 40 MG tablet Take 1 tablet (40 mg total) by mouth daily. IM Program Patient not taking: Reported on 08/27/2018 03/09/18   Toney Rakes, MD  metFORMIN (GLUCOPHAGE) 1000 MG tablet Take 1 tablet (1,000 mg total) by mouth 2 (two) times daily with a meal. Patient not taking: Reported on 08/27/2018 01/01/18   Beola Cord, MD  naproxen (NAPROSYN) 500 MG tablet Take 1 tablet (500 mg total) by mouth 2 (two) times daily. Patient not taking: Reported on 08/27/2018 07/17/18   Liberty Handy, PA-C    Physical Exam: Vitals:   08/27/18 1818 08/27/18 1908 08/27/18 2000 08/27/18 2105  BP:  (!) 127/94 (!) 128/99 134/89  Pulse:  100 96 95  Resp:  12 14 14   Temp:      TempSrc:  SpO2: 100% 100% 98% 100%    Constitutional: NAD, calm, comfortable Eyes: PERRL, lids and conjunctivae normal ENMT: Mucous membranes are moist. Posterior pharynx clear of any exudate or lesions.Normal dentition.  Neck: normal, supple, no masses, no thyromegaly Respiratory: clear to auscultation bilaterally, no wheezing, no crackles. Normal respiratory effort. No accessory muscle use.  Cardiovascular: Regular rate and rhythm, no murmurs / rubs / gallops. No extremity edema. 2+ pedal pulses. No carotid bruits.  Abdomen: no tenderness, no masses palpated. No  hepatosplenomegaly. Bowel sounds positive.  Musculoskeletal: no clubbing / cyanosis. No joint deformity upper and lower extremities. Good ROM, no contractures. Normal muscle tone.  Skin: no rashes, lesions, ulcers. No induration Neurologic: CN 2-12 grossly intact. Sensation intact, DTR normal. Strength 5/5 in all 4.  Psychiatric: Normal judgment and insight. Alert and oriented x 3. Normal mood.    Labs on Admission: I have personally reviewed following labs and imaging studies  CBC: Recent Labs  Lab 08/27/18 1053 08/27/18 1100 08/27/18 1812  WBC 16.9*  --  11.7*  NEUTROABS 14.2*  --   --   HGB 18.3* 20.4* 17.8*  HCT 57.3* 60.0* 53.7*  MCV 94.1  --  93.2  PLT 398  --  319   Basic Metabolic Panel: Recent Labs  Lab 08/27/18 1053 08/27/18 1100 08/27/18 1812  NA 140 140 139  K 5.0 4.9 4.6  CL 107 111 110  CO2 7*  --  9*  GLUCOSE 517* 543* 321*  BUN 14 17 12   CREATININE 1.39* 0.70 1.13  CALCIUM 10.1  --  9.3   GFR: Estimated Creatinine Clearance: 90.2 mL/min (by C-G formula based on SCr of 1.13 mg/dL). Liver Function Tests: Recent Labs  Lab 08/27/18 1403  AST 35  ALT 15  ALKPHOS 117  BILITOT 3.2*  PROT 7.6  ALBUMIN 4.2   Recent Labs  Lab 08/27/18 1403  LIPASE 23   No results for input(s): AMMONIA in the last 168 hours. Coagulation Profile: No results for input(s): INR, PROTIME in the last 168 hours. Cardiac Enzymes: No results for input(s): CKTOTAL, CKMB, CKMBINDEX, TROPONINI in the last 168 hours. BNP (last 3 results) No results for input(s): PROBNP in the last 8760 hours. HbA1C: No results for input(s): HGBA1C in the last 72 hours. CBG: Recent Labs  Lab 08/27/18 1311 08/27/18 1417 08/27/18 1521 08/27/18 1953 08/27/18 2105  GLUCAP 435* 468* 323* 247* 188*   Lipid Profile: No results for input(s): CHOL, HDL, LDLCALC, TRIG, CHOLHDL, LDLDIRECT in the last 72 hours. Thyroid Function Tests: No results for input(s): TSH, T4TOTAL, FREET4, T3FREE,  THYROIDAB in the last 72 hours. Anemia Panel: No results for input(s): VITAMINB12, FOLATE, FERRITIN, TIBC, IRON, RETICCTPCT in the last 72 hours. Urine analysis:    Component Value Date/Time   COLORURINE COLORLESS (A) 04/09/2018 1638   APPEARANCEUR CLEAR 04/09/2018 1638   LABSPEC 1.026 04/09/2018 1638   PHURINE 6.0 04/09/2018 1638   GLUCOSEU >=500 (A) 04/09/2018 1638   HGBUR NEGATIVE 04/09/2018 1638   BILIRUBINUR NEGATIVE 04/09/2018 1638   KETONESUR 5 (A) 04/09/2018 1638   PROTEINUR NEGATIVE 04/09/2018 1638   NITRITE NEGATIVE 04/09/2018 1638   LEUKOCYTESUR NEGATIVE 04/09/2018 1638    Radiological Exams on Admission: Dg Chest 1 View  Result Date: 08/27/2018 CLINICAL DATA:  Chest pain. EXAM: CHEST  1 VIEW COMPARISON:  Radiographs of April 09, 2018. FINDINGS: The heart size and mediastinal contours are within normal limits. Both lungs are clear. No pneumothorax or pleural effusion is noted. The visualized  skeletal structures are unremarkable. IMPRESSION: No active disease. Electronically Signed   By: Lupita Raider, M.D.   On: 08/27/2018 13:16    EKG: Independently reviewed.  Assessment/Plan Principal Problem:   DKA (diabetic ketoacidoses) (HCC) Active Problems:   HTN (hypertension)    1. DKA - 1. DKA pathway 2. IVF and insuiln gtt per protocol 3. BMP Q4H 4. Checking beta-hydroxybuterate to confirm DKA 5. UA pending 2. HTN - currently not taking home BP meds  DVT prophylaxis: Lovenox Code Status: Full Family Communication: No family in room Disposition Plan: Home after admit Consults called: None Admission status: Admit to inpatient  Severity of Illness: The appropriate patient status for this patient is INPATIENT. Inpatient status is judged to be reasonable and necessary in order to provide the required intensity of service to ensure the patient's safety. The patient's presenting symptoms, physical exam findings, and initial radiographic and laboratory data in the  context of their chronic comorbidities is felt to place them at high risk for further clinical deterioration. Furthermore, it is not anticipated that the patient will be medically stable for discharge from the hospital within 2 midnights of admission. The following factors support the patient status of inpatient.   " The patient's presenting symptoms include N/V. " The initial radiographic and laboratory data are worrisome because of DKA. " The chronic co-morbidities include DM1.   * I certify that at the point of admission it is my clinical judgment that the patient will require inpatient hospital care spanning beyond 2 midnights from the point of admission due to high intensity of service, high risk for further deterioration and high frequency of surveillance required.Hillary Bow DO [facility] Pager [phone] Only works nights!  If 7AM-7PM, please contact the primary day team physician taking care of patient  [email or web address] Password [facility]  08/27/2018, 9:27 PM

## 2018-08-27 NOTE — ED Triage Notes (Signed)
Per [facility]:  Patient presents to ED after leaving AMA this morning for DKA admission.  Patient states he began to have CP and general malaise at home again, and mother made him come back.  Took some home insulin right before calling EMS.  Right sided chest pain, no radiation, 8/10.  Given 324 ASA en route. Has 20 in LW.  VSS.

## 2018-08-27 NOTE — ED Notes (Signed)
Patient verbalizes understanding of discharge instructions and that he is leaving AMA. Opportunity for questioning and answers were provided. Armband removed by staff, pt discharged from ED.

## 2018-08-27 NOTE — Consult Note (Signed)
[facility] Medical Consultation  Sulaiman Imbert ZOX:[id number] DOB: Jun 28, 1991 DOA: 08/27/2018 PCP: Patient, No Pcp Per   Requesting physician: Dr. Julieanne Manson Date of consultation: 08/27/2018 Reason for consultation: admission for DKA  HPI:  27 year old male with history of hypertension, type 1 diabetes mellitus, history of medication nonadherence who presents to the hospital with chief complaint of epigastric/abdominal pain over the past day.  He tells me he ate some expired meat, and started having abdominal cramps, nausea and vomiting.  He was unable to eat anything since yesterday.  He denies any fever or chills, he denies any chest pains or palpitations.  He denies any recent illnesses.  He reports compliance with his insulin, took 10 units this morning.  He denies any blood in his emesis, he denies any diarrhea.  In the ED he was found to be in DKA with bicarb of 7, a VBG shows a pH of 7.14, and a hemoglobin is 18.  He has a leukocytosis of 16.  His creatinine 0.7. His CBG initially was 531.  He was started on insulin and fluids and we are asked to admit  Review of Systems:  As per HPI, otherwise 10 point review of systems negative   Impression/Recommendations Active Problems:   HTN (hypertension)   DKA (diabetic ketoacidoses) (HCC)   Hyperglycemia   1. DKA - patient clearly in DKA with increased anion gap, low bicarb, and acidosis.  Recommend admission to stepdown, however at this time patient desires to leave AGAINST MEDICAL ADVICE, patient has been warned that this is not Medically advisable at this time, and can result in Medical complications like Death and Disability, patient understands and accepts the risks involved and assumes full responsibilty of this decision. He reports that he has insulin and diabetic supplies at home and he is in no need of anything at this point.    Past Medical History:  Diagnosis Date  . Asthma   . DKA (diabetic ketoacidoses) (HCC)  06/2017; 10/17/2017  . DKA (diabetic ketoacidoses) (HCC) 02/2018  . Hyperglycemia 09/24/2017  . Obesity   . Type II diabetes mellitus (HCC)    "dx'd 06/2017"   Past Surgical History:  Procedure Laterality Date  . LACERATION REPAIR Left    "stitched finger up"  . TONSILLECTOMY     Social History:  reports that he has been smoking cigarettes. He has a 1.20 pack-year smoking history. He has never used smokeless tobacco. He reports that he drinks alcohol. He reports that he has current or past drug history. Drug: Marijuana.  Allergies  Allergen Reactions  . Vicodin [Hydrocodone-Acetaminophen] Anaphylaxis    Tolerates tylenol with no allergy  . Eggs Or Egg-Derived Products Nausea And Vomiting  . Lactose Intolerance (Gi) Rash   History reviewed. No pertinent family history.  Prior to Admission medications   Medication Sig Start Date End Date Taking? Authorizing Provider  insulin aspart protamine - aspart (NOVOLOG MIX 70/30 FLEXPEN) (70-30) 100 UNIT/ML FlexPen Inject 0.65 mLs (65 Units total) into the skin 2 (two) times daily. IM Program Patient taking differently: Inject 10 Units into the skin 2 (two) times daily. IM Program 03/09/18  Yes Lacroce, Ames Coupe, MD  Insulin Pen Needle 32G X 8 MM MISC 65 Units by Does not apply route 2 (two) times daily. IM Program 03/09/18   Toney Rakes, MD  lisinopril (PRINIVIL,ZESTRIL) 40 MG tablet Take 1 tablet (40 mg total) by mouth daily. IM Program Patient not taking: Reported on 04/09/2018 03/09/18   Landis Martins  J, MD  metFORMIN (GLUCOPHAGE) 1000 MG tablet Take 1 tablet (1,000 mg total) by mouth 2 (two) times daily with a meal. Patient not taking: Reported on 08/27/2018 01/01/18   Beola Cord, MD  naproxen (NAPROSYN) 500 MG tablet Take 1 tablet (500 mg total) by mouth 2 (two) times daily. Patient not taking: Reported on 08/27/2018 07/17/18   Liberty Handy, PA-C   Physical Exam: Blood pressure (!) 141/94, pulse (!) 108, temperature  98.2 F (36.8 C), temperature source Oral, resp. rate 18, height 5\' 6"  (1.676 m), weight 74.8 kg, SpO2 97 %. Vitals:   08/27/18 1350 08/27/18 1445  BP:  (!) 141/94  Pulse: (!) 110 (!) 108  Resp: (!) 21 18  Temp:    SpO2: 100% 97%     General: No distress  Eyes: No scleral icterus  ENT: No discharges  Neck: Supple, no masses  Cardiovascular: Regular rhythm rhythm, no murmurs.  No edema  Respiratory: Clear to auscultation, no wheezing, no crackles  Abdomen: Soft, nontender, bowel sounds positive  Skin: No rashes seen  Psychiatric: Alert and oriented x4  Neurologic: Nonfocal  Labs on Admission:  Basic Metabolic Panel: Recent Labs  Lab 08/27/18 1053 08/27/18 1100  NA 140 140  K 5.0 4.9  CL 107 111  CO2 7*  --   GLUCOSE 517* 543*  BUN 14 17  CREATININE 1.39* 0.70  CALCIUM 10.1  --    Liver Function Tests: No results for input(s): AST, ALT, ALKPHOS, BILITOT, PROT, ALBUMIN in the last 168 hours. No results for input(s): LIPASE, AMYLASE in the last 168 hours. No results for input(s): AMMONIA in the last 168 hours. CBC: Recent Labs  Lab 08/27/18 1053 08/27/18 1100  WBC 16.9*  --   NEUTROABS 14.2*  --   HGB 18.3* 20.4*  HCT 57.3* 60.0*  MCV 94.1  --   PLT 398  --    Cardiac Enzymes: No results for input(s): CKTOTAL, CKMB, CKMBINDEX, TROPONINI in the last 168 hours. BNP: Invalid input(s): POCBNP CBG: Recent Labs  Lab 08/27/18 1023 08/27/18 1203 08/27/18 1311 08/27/18 1417  GLUCAP 531* 485* 435* 468*    Radiological Exams on Admission: Dg Chest 1 View  Result Date: 08/27/2018 CLINICAL DATA:  Chest pain. EXAM: CHEST  1 VIEW COMPARISON:  Radiographs of April 09, 2018. FINDINGS: The heart size and mediastinal contours are within normal limits. Both lungs are clear. No pneumothorax or pleural effusion is noted. The visualized skeletal structures are unremarkable. IMPRESSION: No active disease. Electronically Signed   By: Lupita Raider, M.D.   On:  08/27/2018 13:16    EKG: Independently reviewed.  Sinus tachycardia   Yoshiye Kraft [facility] Pager [phone]  If 7PM-7AM, please contact night-coverage [email or web address] Password [email or web address] 08/27/2018, 2:57 PM

## 2018-08-28 DIAGNOSIS — E1065 Type 1 diabetes mellitus with hyperglycemia: Secondary | ICD-10-CM

## 2018-08-28 LAB — BASIC METABOLIC PANEL
ANION GAP: 9 (ref 5–15)
Anion gap: 10 (ref 5–15)
Anion gap: 9 (ref 5–15)
BUN: 10 mg/dL (ref 6–20)
BUN: 8 mg/dL (ref 6–20)
BUN: 8 mg/dL (ref 6–20)
CALCIUM: 8.8 mg/dL — AB (ref 8.9–10.3)
CALCIUM: 8.7 mg/dL — AB (ref 8.9–10.3)
CALCIUM: 9 mg/dL (ref 8.9–10.3)
CHLORIDE: 108 mmol/L (ref 98–111)
CO2: 15 mmol/L — AB (ref 22–32)
CO2: 17 mmol/L — AB (ref 22–32)
CO2: 19 mmol/L — ABNORMAL LOW (ref 22–32)
Chloride: 112 mmol/L — ABNORMAL HIGH (ref 98–111)
Chloride: 111 mmol/L (ref 98–111)
Creatinine, Ser: 0.82 mg/dL (ref 0.61–1.24)
Creatinine, Ser: 0.75 mg/dL (ref 0.61–1.24)
Creatinine, Ser: 0.82 mg/dL (ref 0.61–1.24)
GFR calc Af Amer: >60 mL/min (ref >60)
GFR calc Af Amer: >60 mL/min (ref >60)
GFR calc non Af Amer: >60 mL/min (ref >60)
GLUCOSE: 153 mg/dL — AB (ref 70–99)
GLUCOSE: 231 mg/dL — AB (ref 70–99)
Glucose, Bld: 359 mg/dL — ABNORMAL HIGH (ref 70–99)
POTASSIUM: 4 mmol/L (ref 3.5–5.1)
Potassium: 4 mmol/L (ref 3.5–5.1)
Potassium: 4.6 mmol/L (ref 3.5–5.1)
Sodium: 137 mmol/L (ref 135–145)
Sodium: 137 mmol/L (ref 135–145)
Sodium: 136 mmol/L (ref 135–145)

## 2018-08-28 LAB — CBG MONITORING, ED
GLUCOSE-CAPILLARY: 192 mg/dL — AB (ref 70–99)
GLUCOSE-CAPILLARY: 237 mg/dL — AB (ref 70–99)
GLUCOSE-CAPILLARY: 251 mg/dL — AB (ref 70–99)
GLUCOSE-CAPILLARY: 360 mg/dL — AB (ref 70–99)
Glucose-Capillary: 235 mg/dL — ABNORMAL HIGH (ref 70–99)
Glucose-Capillary: 240 mg/dL — ABNORMAL HIGH (ref 70–99)
Glucose-Capillary: 226 mg/dL — ABNORMAL HIGH (ref 70–99)

## 2018-08-28 LAB — HIV ANTIBODY (ROUTINE TESTING W REFLEX): HIV SCREEN 4TH GENERATION: NONREACTIVE

## 2018-08-28 LAB — BETA-HYDROXYBUTYRIC ACID: BETA-HYDROXYBUTYRIC ACID: 2.59 mmol/L — AB (ref 0.05–0.27)

## 2018-08-28 LAB — HEMOGLOBIN A1C
Hgb A1c MFr Bld: 12.8 % — ABNORMAL HIGH (ref 4.8–5.6)
MEAN PLASMA GLUCOSE: 320.66 mg/dL

## 2018-08-28 MED ORDER — INSULIN ASPART 100 UNIT/ML ~~LOC~~ SOLN
0.0000 [IU] | Freq: Three times a day (TID) | SUBCUTANEOUS | Status: DC
  Administered 2018-08-28: 5 [IU] via SUBCUTANEOUS
  Administered 2018-08-28: 15 [IU] via SUBCUTANEOUS
  Filled 2018-08-28 (×2): qty 1

## 2018-08-28 MED ORDER — INSULIN GLARGINE 100 UNIT/ML ~~LOC~~ SOLN
14.0000 [IU] | Freq: Every day | SUBCUTANEOUS | Status: DC
  Administered 2018-08-28: 14 [IU] via SUBCUTANEOUS
  Filled 2018-08-28 (×2): qty 0.14

## 2018-08-28 MED ORDER — INSULIN ISOPHANE & REGULAR (HUMAN 70-30)100 UNIT/ML KWIKPEN: 16.0000 [IU] | PEN_INJECTOR | Freq: Two times a day (BID) | SUBCUTANEOUS | 11 refills | Status: DC

## 2018-08-28 NOTE — ED Notes (Signed)
CBG was after pt ate

## 2018-08-28 NOTE — ED Notes (Signed)
Breakfast ordered 

## 2018-08-28 NOTE — Progress Notes (Signed)
Inpatient Diabetes Program Recommendations  AACE/ADA: New Consensus Statement on Inpatient Glycemic Control (2015)  Target Ranges:  Prepandial:   less than 140 mg/dL      Peak postprandial:   less than 180 mg/dL (1-2 hours)      Critically ill patients:  140 - 180 mg/dL    Spoke with patient about diabetes and home regimen for diabetes control. Patient reports that he initially followed up in the internal medicine clinic when he was diagnosed in 06/2017, however has not been back since. Patient had been going to [facility] for insulin. Gave patient the [employer] price list for DM products and insulin to patient.  Patient stated he had not been taking his insulin as prescribed. Said he felt bad when taking it, so her altered the dose and took 10 units. Patient also reported missing his evening 70/30 dose often. Patient also reported not checking glucose frequently. Noted patient weighed 138.3kg when diagnosed with DM and now weighs 74.8kg Discussed importance of glucose control. Patient states he knows he needs to do better. Patient wants to leave and go to work due to financial constraints.  Made plan with patient for d/c: Check glucose 3 times a day  Patient to get glucose strip refills at [facility] Patient to take insulin, 70/30 16 units BID consistently with meals Patient will call the internal medicine clinic for follow up and possibly insulin assistance. Patient to get insulin and syringes from [facility] for now via vial and syringe (patient is familiar with how to draw up and administer via vial and syringe)  Discussed plan of care with Dr. Nelson Chimes.  Thanks,  Christena Deem RN, MSN, BC-ADM Inpatient Diabetes Coordinator Team Pager [phone] (8a-5p)

## 2018-08-28 NOTE — Discharge Summary (Signed)
Physician Discharge Summary  Kohei Antonellis ZOX:[id number] DOB: [birth year] DOA: 08/27/2018  PCP: Patient, No Pcp Per  Admit date: 08/27/2018 Discharge date: 08/28/2018  Admitted From: Home  Disposition:  Home   Recommendations for Outpatient Follow-up:  1. Follow up with PCP in 1 weeks. Internal medicine clinic ,where he used to go. This way her can get his insulin from them as previously.  2. Please obtain BMP/CBC in one week your next doctors visit.  3. Will discharge him on Novolin 70/30 relion 16 units twice daily-information on how to get this over-the-counter at [facility] has been given by the diabetic coordinator.  Discharge Condition: Stable CODE STATUS: Full code Diet recommendation: Diabetes  Brief/Interim Summary: 27 year old with a history of diabetes mellitus type 1 with noncompliance came to the hospital with complaints of abdominal discomfort.  Patient came yesterday morning and was found to be in DKA but he ended up leaving AGAINST MEDICAL ADVICE.  Later on the night he returned with similar complaints and was found to be in DKA again.  He was started on insulin drip and his anion gap corrected within few hours.  He was given long-acting Lantus 14 units and placed on sliding scale.  He was tolerating oral diet without any issues.  Patient was seen by diabetic coordinator and it was determined that patient would benefit from over-the-counter [employer] brand 70/30 Relion 16 units twice daily insulin.  Patient already has glucometer and rest of the supplies at home.  Patient is adamant about leaving right now as he has to go to work and cannot miss his work.  Because his anion gap is closed, able to tolerate oral diet without any issues and understands the importance of taking insulin he can be discharged in stable condition.  He is been advised to follow-up outpatient with internal medicine residency clinic, apparently he gets his free insulin from the clinic.  He is advised to  keep a log of his blood glucose therefore further adjustments can be made.   Discharge Diagnoses:  Principal Problem:   DKA (diabetic ketoacidoses) (HCC) Active Problems:   HTN (hypertension)  Diabetic ketoacidosis in setting of diabetes mellitus type 1 Medical noncompliance - Patient was initially started on DKA protocol and over the course of several hours his anion gap closed and he was able to tolerate oral diet without any issues.  He he was seen by diabetic coordinator and was given appropriate education.  Patient is adamant about leaving today as he has to go to work.  He has been given information about [employer] brand over-the-counter 70/30 insulin-16 units twice daily.  Advised to keep blood glucose log and follow-up outpatient with internal medicine clinic where he used to get his free insulin.  He already has glucometer and restless supplies at home.  Encouraged to remain compliant with his diabetic diet. Slowly uptitrated his insulin, to avoid signs and symptoms of relative hypoglycemia Recently he has lost quite a bit of weight. Slowly outpatient he can be started on low-dose ACE inhibitor as well.  Discharge him today in stable condition.  Discharge Instructions   Allergies as of 08/28/2018      Reactions   Vicodin [hydrocodone-acetaminophen] Anaphylaxis   Tolerates tylenol with no allergy   Eggs Or Egg-derived Products Nausea And Vomiting   Lactose Intolerance (gi) Rash      Medication List    STOP taking these medications   insulin aspart protamine - aspart (70-30) 100 UNIT/ML FlexPen Commonly known as:  NOVOLOG 70/30  MIX   Insulin Pen Needle 32G X 8 MM Misc   lisinopril 40 MG tablet Commonly known as:  PRINIVIL,ZESTRIL   metFORMIN 1000 MG tablet Commonly known as:  GLUCOPHAGE   naproxen 500 MG tablet Commonly known as:  NAPROSYN   naproxen sodium 220 MG tablet Commonly known as:  ALEVE     TAKE these medications   Insulin Isophane & Regular Human  (70-30) 100 UNIT/ML PEN Commonly known as:  HUMULIN 70/30 MIX Inject 16 Units into the skin 2 (two) times daily.      Follow-up Information    [facility]. Schedule an appointment as soon as possible for a visit in 1 week(s).   Contact information: [address] [place] [address] [phone]         Allergies  Allergen Reactions  . Vicodin [Hydrocodone-Acetaminophen] Anaphylaxis    Tolerates tylenol with no allergy  . Eggs Or Egg-Derived Products Nausea And Vomiting  . Lactose Intolerance (Gi) Rash    You were cared for by a hospitalist during your hospital stay. If you have any questions about your discharge medications or the care you received while you were in the hospital after you are discharged, you can call the unit and asked to speak with the hospitalist on call if the hospitalist that took care of you is not available. Once you are discharged, your primary care physician will handle any further medical issues. Please note that no refills for any discharge medications will be authorized once you are discharged, as it is imperative that you return to your primary care physician (or establish a relationship with a primary care physician if you do not have one) for your aftercare needs so that they can reassess your need for medications and monitor your lab values.  Consultations:  Diabetic coordinator   Procedures/Studies: Dg Chest 1 View  Result Date: 08/27/2018 CLINICAL DATA:  Chest pain. EXAM: CHEST  1 VIEW COMPARISON:  Radiographs of April 09, 2018. FINDINGS: The heart size and mediastinal contours are within normal limits. Both lungs are clear. No pneumothorax or pleural effusion is noted. The visualized skeletal structures are unremarkable. IMPRESSION: No active disease. Electronically Signed   By: Lupita Raider, M.D.   On: 08/27/2018 13:16      Subjective: Patient reports she has not been taking his medication  consistently as he has been prescribed.  He also does not routinely check his blood glucose at home.  He used to live here and following her medicine clinic but moved to [place] for a few weeks and now he is back.  He reports that previously he used to get his insulin from internal medicine clinic and recently has been getting from [facility]. Currently he wants to go home.  General = no fevers, chills, dizziness, malaise, fatigue HEENT/EYES = negative for pain, redness, loss of vision, double vision, blurred vision, loss of hearing, sore throat, hoarseness, dysphagia Cardiovascular= negative for chest pain, palpitation, murmurs, lower extremity swelling Respiratory/lungs= negative for shortness of breath, cough, hemoptysis, wheezing, mucus production Gastrointestinal= negative for nausea, vomiting,, abdominal pain, melena, hematemesis Genitourinary= negative for Dysuria, Hematuria, Change in Urinary Frequency MSK = Negative for arthralgia, myalgias, Back Pain, Joint swelling  Neurology= Negative for headache, seizures, numbness, tingling  Psychiatry= Negative for anxiety, depression, suicidal and homocidal ideation Allergy/Immunology= Medication/Food allergy as listed  Skin= Negative for Rash, lesions, ulcers, itching   Discharge Exam: Vitals:   08/28/18 1015 08/28/18 1145  BP:  Pulse: 83 72  Resp: 19 17  Temp:    SpO2: 99% 100%   Vitals:   08/28/18 0730 08/28/18 0800 08/28/18 1015 08/28/18 1145  BP: 120/74 107/72    Pulse: 78 78 83 72  Resp: 14 19 19 17   Temp:      TempSrc:      SpO2: 100% 99% 99% 100%    General: Pt is alert, awake, not in acute distress Cardiovascular: RRR, S1/S2 +, no rubs, no gallops Respiratory: CTA bilaterally, no wheezing, no rhonchi Abdominal: Soft, NT, ND, bowel sounds + Extremities: no edema, no cyanosis    The results of significant diagnostics from this hospitalization (including imaging, microbiology, ancillary and laboratory) are listed  below for reference.     Microbiology: No results found for this or any previous visit (from the past 240 hour(s)).   Labs: BNP (last 3 results) No results for input(s): BNP in the last 8760 hours. Basic Metabolic Panel: Recent Labs  Lab 08/27/18 1053 08/27/18 1100 08/27/18 1812 08/27/18 2119 08/28/18 0538  NA 140 140 139 137 137  K 5.0 4.9 4.6 4.0 4.0  CL 107 111 110 112* 111  CO2 7*  --  9* 15* 17*  GLUCOSE 517* 543* 321* 153* 231*  BUN 14 17 12 10 8   CREATININE 1.39* 0.70 1.13 0.82 0.75  CALCIUM 10.1  --  9.3 8.8* 8.7*   Liver Function Tests: Recent Labs  Lab 08/27/18 1403  AST 35  ALT 15  ALKPHOS 117  BILITOT 3.2*  PROT 7.6  ALBUMIN 4.2   Recent Labs  Lab 08/27/18 1403  LIPASE 23   No results for input(s): AMMONIA in the last 168 hours. CBC: Recent Labs  Lab 08/27/18 1053 08/27/18 1100 08/27/18 1812  WBC 16.9*  --  11.7*  NEUTROABS 14.2*  --   --   HGB 18.3* 20.4* 17.8*  HCT 57.3* 60.0* 53.7*  MCV 94.1  --  93.2  PLT 398  --  319   Cardiac Enzymes: No results for input(s): CKTOTAL, CKMB, CKMBINDEX, TROPONINI in the last 168 hours. BNP: Invalid input(s): POCBNP CBG: Recent Labs  Lab 08/28/18 0300 08/28/18 0530 08/28/18 0644 08/28/18 0849 08/28/18 1136  GLUCAP 240* 237* 226* 251* 360*   D-Dimer No results for input(s): DDIMER in the last 72 hours. Hgb A1c No results for input(s): HGBA1C in the last 72 hours. Lipid Profile No results for input(s): CHOL, HDL, LDLCALC, TRIG, CHOLHDL, LDLDIRECT in the last 72 hours. Thyroid function studies No results for input(s): TSH, T4TOTAL, T3FREE, THYROIDAB in the last 72 hours.  Invalid input(s): FREET3 Anemia work up No results for input(s): VITAMINB12, FOLATE, FERRITIN, TIBC, IRON, RETICCTPCT in the last 72 hours. Urinalysis    Component Value Date/Time   COLORURINE YELLOW 08/27/2018 2113   APPEARANCEUR CLEAR 08/27/2018 2113   LABSPEC 1.017 08/27/2018 2113   PHURINE 5.0 08/27/2018 2113    GLUCOSEU >=500 (A) 08/27/2018 2113   HGBUR NEGATIVE 08/27/2018 2113   BILIRUBINUR NEGATIVE 08/27/2018 2113   KETONESUR 80 (A) 08/27/2018 2113   PROTEINUR NEGATIVE 08/27/2018 2113   NITRITE NEGATIVE 08/27/2018 2113   LEUKOCYTESUR NEGATIVE 08/27/2018 2113   Sepsis Labs Invalid input(s): PROCALCITONIN,  WBC,  LACTICIDVEN Microbiology No results found for this or any previous visit (from the past 240 hour(s)).   Time coordinating discharge:  I have spent 35 minutes face to face with the patient and on the ward discussing the patients care, assessment, plan and disposition with other care givers. >  50% of the time was devoted counseling the patient about the risks and benefits of treatment/Discharge disposition and coordinating care.   SIGNED:   Dimple Nanas, MD  [facility] 08/28/2018, 11:48 AM Pager   If 7PM-7AM, please contact night-coverage [email or web address] Password [email or web address]

## 2018-08-28 NOTE — ED Notes (Signed)
Pt seen by diabetic nurse and admitting physician who decided to discharge pt. Pt agreeable and understands discharge and follow up.

## 2018-09-21 ENCOUNTER — Telehealth: Payer: Self-pay | Admitting: Internal Medicine

## 2018-09-21 NOTE — Telephone Encounter (Signed)
Returned call to [name] at 11:30 AM. [facility] is [facility]. He did not comment on the reason and stated it was not urgent. Patient aware [facility] closed during the weekend. I sent a message to the front desk. I adviced [facility] to [facility] Monday to try to schedule a same day appointment.   Burna CashIdalys Santos-Sanchez, MD  Internal Medicine PGY-2  P [phone]

## 2018-10-28 ENCOUNTER — Emergency Department (HOSPITAL_COMMUNITY): Admission: EM | Admit: 2018-10-28 | Discharge: 2018-10-28 | Discharge status: Against Medical Advice | Payer: Self-pay

## 2018-10-28 NOTE — ED Notes (Signed)
Pt up to desk asking about wait time, Pt stated that he would not be waiting to see the doctor.

## 2018-11-18 ENCOUNTER — Emergency Department (HOSPITAL_COMMUNITY)
Admission: EM | Admit: 2018-11-18 | Discharge: 2018-11-18 | Discharge status: Against Medical Advice | Payer: Self-pay | Attending: Emergency Medicine | Admitting: Emergency Medicine

## 2018-11-18 ENCOUNTER — Emergency Department (HOSPITAL_COMMUNITY): Payer: Self-pay

## 2018-11-18 DIAGNOSIS — R05 Cough: Secondary | ICD-10-CM

## 2018-11-18 DIAGNOSIS — F17210 Nicotine dependence, cigarettes, uncomplicated: Secondary | ICD-10-CM | POA: Insufficient documentation

## 2018-11-18 DIAGNOSIS — R0789 Other chest pain: Secondary | ICD-10-CM | POA: Insufficient documentation

## 2018-11-18 DIAGNOSIS — E119 Type 2 diabetes mellitus without complications: Secondary | ICD-10-CM | POA: Insufficient documentation

## 2018-11-18 DIAGNOSIS — R059 Cough, unspecified: Secondary | ICD-10-CM

## 2018-11-18 DIAGNOSIS — J45909 Unspecified asthma, uncomplicated: Secondary | ICD-10-CM | POA: Insufficient documentation

## 2018-11-18 DIAGNOSIS — R079 Chest pain, unspecified: Secondary | ICD-10-CM

## 2018-11-18 LAB — CBG MONITORING, ED: Glucose-Capillary: >600 mg/dL (ref 70–99)

## 2018-11-18 NOTE — ED Triage Notes (Signed)
Per [facility]: Patient to ED from home c/o chest wall pain today - states he's had a productive cough x 4 days. Denies shortness of breath, dizziness, N/V. Resp e/u.

## 2018-11-18 NOTE — ED Provider Notes (Addendum)
[facility] Provider Note   CSN: [id number] Arrival date & time: 11/18/18  1700     History   Chief Complaint Chief Complaint  Patient presents with  . Cough    HPI Roger HartshornGregory Farley is a 28 y.o. male with history of asthma, type 2 DM presents to the emergency department today with chief complaint of chest pain.  He states the pain has been present for 2 weeks, but is worse today.  It has been intermittent.  He has pain at rest and denies pain with exertion.  He describes the pain as sharp located in the middle of his chest. The pain is worse with inspiration.  It does not radiate.  He has not taken anything for pain prior to arrival.  He denies shortness of breath, dizziness, nausea, vomiting, palpitations.  Past Medical History:  Diagnosis Date  . Asthma   . DKA (diabetic ketoacidoses) (HCC) 06/2017; 10/17/2017  . DKA (diabetic ketoacidoses) (HCC) 02/2018  . Hyperglycemia 09/24/2017  . Obesity   . Type II diabetes mellitus (HCC)    "dx'd 06/2017"    Patient Active Problem List   Diagnosis Date Noted  . Hypokalemia 03/18/2018  . Normocytic anemia 03/18/2018  . Hyperglycemia   . CAP (community acquired pneumonia) 03/17/2018  . DKA (diabetic ketoacidoses) (HCC) 03/06/2018  . Right bundle branch block (RBBB) determined by electrocardiography 08/24/2017  . Type 2 diabetes mellitus (HCC) 08/01/2017  . Smoking 08/01/2017  . HTN (hypertension) 07/25/2017    Past Surgical History:  Procedure Laterality Date  . LACERATION REPAIR Left    "stitched finger up"  . TONSILLECTOMY          Home Medications    Prior to Admission medications   Medication Sig Start Date End Date Taking? Authorizing Provider  Insulin Isophane & Regular Human (NOVOLIN 70/30 FLEXPEN RELION) (70-30) 100 UNIT/ML PEN Inject 16 Units into the skin 2 (two) times daily. 08/28/18   Dimple NanasAmin, Ankit Chirag, MD    Family History No family history on file.  Social  History Social History   Tobacco Use  . Smoking status: Current Every Day Smoker    Packs/day: 0.10    Years: 12.00    Pack years: 1.20    Types: Cigarettes  . Smokeless tobacco: Never Used  . Tobacco comment: cutting back - now 1-2 cigarettes per day  Substance Use Topics  . Alcohol use: Yes    Comment: 10/17/2017 "2 shots of liquor/month; maybe"  . Drug use: Yes    Types: Marijuana    Comment: 10/17/2017 ~3 times per week     Allergies   Vicodin [hydrocodone-acetaminophen]; Eggs or egg-derived products; and Lactose intolerance (gi)   Review of Systems Review of Systems  Constitutional: Negative for chills and fever.  HENT: Negative for congestion, sinus pressure and sore throat.   Eyes: Negative for pain and visual disturbance.  Respiratory: Negative for chest tightness and shortness of breath.   Cardiovascular: Positive for chest pain. Negative for palpitations.  Gastrointestinal: Negative for abdominal pain, diarrhea, nausea and vomiting.  Genitourinary: Negative for difficulty urinating and hematuria.  Musculoskeletal: Negative for arthralgias, back pain and neck pain.  Skin: Negative for rash and wound.  Neurological: Negative for dizziness, syncope, weakness and headaches.     Physical Exam Updated Vital Signs BP 130/80 (BP Location: Right Arm)   Pulse 71   Temp 97.9 F (36.6 C) (Oral)   Resp 16   Ht 5\' 6"  (1.676 m)  Wt 72.6 kg   SpO2 99%   BMI 25.82 kg/m   Physical Exam Vitals signs and nursing note reviewed.  Constitutional:      Appearance: He is not ill-appearing or toxic-appearing.  HENT:     Head: Normocephalic and atraumatic.     Nose: Nose normal.     Mouth/Throat:     Mouth: Mucous membranes are moist.     Pharynx: Oropharynx is clear.  Eyes:     Conjunctiva/sclera: Conjunctivae normal.  Neck:     Musculoskeletal: Normal range of motion.  Cardiovascular:     Rate and Rhythm: Normal rate and regular rhythm.     Pulses: Normal pulses.      Heart sounds: Normal heart sounds.  Pulmonary:     Effort: Pulmonary effort is normal.     Breath sounds: Normal breath sounds.  Abdominal:     General: There is no distension.     Palpations: Abdomen is soft.     Tenderness: There is no abdominal tenderness. There is no guarding or rebound.  Musculoskeletal: Normal range of motion.     Comments: Chest pain is reproducible with palpation to anterior of his right chest.  Skin:    General: Skin is warm and dry.     Capillary Refill: Capillary refill takes less than 2 seconds.  Neurological:     Mental Status: He is alert. Mental status is at baseline.     Motor: No weakness.  Psychiatric:        Behavior: Behavior normal.      ED Treatments / Results  Labs (all labs ordered are listed, but only abnormal results are displayed) Labs Reviewed - No data to display  EKG None  Radiology No results found.  Procedures Procedures (including critical care time)  Medications Ordered in ED Medications - No data to display   Initial Impression / Assessment and Plan / ED Course  I have reviewed the triage vital signs and the nursing notes.  Pertinent labs & imaging results that were available during my care of the patient were reviewed by me and considered in my medical decision making (see chart for details).   Pt's symptoms have been present for 2 weeks. His chest pain is reproducible on my exam. I reviewed his EKG and it is unchanged from prior. His chest xray is not concerning for acute infectious process. He does not check his glucose regularly at home and reports that he "goes by the feel of his body." His glucose today is >600. Will order BMP to check for elevated anion gap suggesting DKA.  When RN went to draw labs, pt reports that he does not want blood drawn and will be leaving. I was informed of his departure after he had left the hospital.   Final Clinical Impressions(s) / ED Diagnoses   Final diagnoses:  None     ED Discharge Orders    None       Sherene Sires, PA-C 11/18/18 2107    , Mliss Sax 11/18/18 2111    Eber Hong, MD 11/20/18 1125

## 2018-11-18 NOTE — ED Notes (Signed)
Upon entering room to start IV and draw labs pt states he doesn't want to stay. States he will be leaving and does not want to be stuck. Pt understands risk of leaving AMA.

## 2019-01-11 ENCOUNTER — Emergency Department (HOSPITAL_COMMUNITY): Payer: Self-pay

## 2019-01-11 ENCOUNTER — Other Ambulatory Visit: Payer: Self-pay

## 2019-01-11 ENCOUNTER — Emergency Department (HOSPITAL_COMMUNITY)
Admission: EM | Admit: 2019-01-11 | Discharge: 2019-01-11 | Discharge status: Against Medical Advice | Payer: Self-pay | Attending: Emergency Medicine | Admitting: Emergency Medicine

## 2019-01-11 DIAGNOSIS — E1010 Type 1 diabetes mellitus with ketoacidosis without coma: Secondary | ICD-10-CM | POA: Insufficient documentation

## 2019-01-11 DIAGNOSIS — Z9114 Patient's other noncompliance with medication regimen: Secondary | ICD-10-CM | POA: Insufficient documentation

## 2019-01-11 DIAGNOSIS — I10 Essential (primary) hypertension: Secondary | ICD-10-CM | POA: Insufficient documentation

## 2019-01-11 DIAGNOSIS — Z5320 Procedure and treatment not carried out because of patient's decision for unspecified reasons: Secondary | ICD-10-CM | POA: Insufficient documentation

## 2019-01-11 DIAGNOSIS — R0602 Shortness of breath: Secondary | ICD-10-CM | POA: Insufficient documentation

## 2019-01-11 DIAGNOSIS — F1290 Cannabis use, unspecified, uncomplicated: Secondary | ICD-10-CM | POA: Insufficient documentation

## 2019-01-11 DIAGNOSIS — F17210 Nicotine dependence, cigarettes, uncomplicated: Secondary | ICD-10-CM | POA: Insufficient documentation

## 2019-01-11 LAB — POCT I-STAT EG7
Acid-base deficit: 12 mmol/L — ABNORMAL HIGH (ref 0.0–2.0)
Bicarbonate: 15.9 mmol/L — ABNORMAL LOW (ref 20.0–28.0)
Calcium, Ion: 1.25 mmol/L (ref 1.15–1.40)
HCT: 45 % (ref 39.0–52.0)
Hemoglobin: 15.3 g/dL (ref 13.0–17.0)
O2 Saturation: 22 %
Potassium: 4.2 mmol/L (ref 3.5–5.1)
Sodium: 135 mmol/L (ref 135–145)
TCO2: 17 mmol/L — ABNORMAL LOW (ref 22–32)
pCO2, Ven: 41.6 mmHg — ABNORMAL LOW (ref 44.0–60.0)
pH, Ven: 7.191 — CL (ref 7.250–7.430)
pO2, Ven: 20 mmHg — CL (ref 32.0–45.0)

## 2019-01-11 LAB — CBC
HCT: 52.4 % — ABNORMAL HIGH (ref 39.0–52.0)
Hemoglobin: 18.6 g/dL — ABNORMAL HIGH (ref 13.0–17.0)
MCH: 31.1 pg (ref 26.0–34.0)
MCHC: 35.5 g/dL (ref 30.0–36.0)
MCV: 87.5 fL (ref 80.0–100.0)
Platelets: 251 10*3/uL (ref 150–400)
RBC: 5.99 MIL/uL — ABNORMAL HIGH (ref 4.22–5.81)
RDW: 11.7 % (ref 11.5–15.5)
WBC: 6.5 10*3/uL (ref 4.0–10.5)
nRBC: 0 % (ref 0.0–0.2)

## 2019-01-11 LAB — BASIC METABOLIC PANEL
Anion gap: 21 — ABNORMAL HIGH (ref 5–15)
BUN: 10 mg/dL (ref 6–20)
CHLORIDE: 102 mmol/L (ref 98–111)
CO2: 8 mmol/L — ABNORMAL LOW (ref 22–32)
Calcium: 9.2 mg/dL (ref 8.9–10.3)
Creatinine, Ser: 1.08 mg/dL (ref 0.61–1.24)
GFR calc Af Amer: >60 mL/min (ref >60)
GFR calc non Af Amer: >60 mL/min (ref >60)
Glucose, Bld: 505 mg/dL (ref 70–99)
Potassium: 4.5 mmol/L (ref 3.5–5.1)
Sodium: 131 mmol/L — ABNORMAL LOW (ref 135–145)

## 2019-01-11 LAB — CBG MONITORING, ED
Glucose-Capillary: 382 mg/dL — ABNORMAL HIGH (ref 70–99)
Glucose-Capillary: 519 mg/dL (ref 70–99)
Glucose-Capillary: 273 mg/dL — ABNORMAL HIGH (ref 70–99)

## 2019-01-11 LAB — I-STAT TROPONIN, ED: Troponin i, poc: 0 ng/mL (ref 0.00–0.08)

## 2019-01-11 MED ORDER — SODIUM CHLORIDE 0.9 % IV BOLUS
1000.0000 mL | Freq: Once | INTRAVENOUS | Status: AC
  Administered 2019-01-11: 1000 mL via INTRAVENOUS

## 2019-01-11 MED ORDER — SODIUM CHLORIDE 0.9 % IV SOLN: INTRAVENOUS | Status: DC

## 2019-01-11 MED ORDER — INSULIN REGULAR(HUMAN) IN NACL 100-0.9 UT/100ML-% IV SOLN
INTRAVENOUS | Status: DC
  Administered 2019-01-11 (×2): 4.5 [IU]/h via INTRAVENOUS
  Filled 2019-01-11: qty 100

## 2019-01-11 MED ORDER — SODIUM CHLORIDE 0.9% FLUSH
3.0000 mL | Freq: Once | INTRAVENOUS | Status: AC
  Administered 2019-01-11: 3 mL via INTRAVENOUS

## 2019-01-11 MED ORDER — POTASSIUM CHLORIDE 10 MEQ/100ML IV SOLN
10.0000 meq | INTRAVENOUS | Status: AC
  Administered 2019-01-11 (×2): 10 meq via INTRAVENOUS
  Filled 2019-01-11 (×2): qty 100

## 2019-01-11 MED ORDER — DEXTROSE-NACL 5-0.45 % IV SOLN: INTRAVENOUS | Status: DC

## 2019-01-11 NOTE — ED Provider Notes (Signed)
[facility] EMERGENCY DEPARTMENT Provider Note   CSN: [id number] Arrival date & time: 01/11/19  1453    History   Chief Complaint Chief Complaint  Patient presents with  . Chest Pain    HPI Steven Sleppy is a 28 y.o. male.     28yo M w/ PMH including IDDM, DKA, asthma, HTN, medication non-compliance who p/w CP and SOB. PT states that 3 days ago he began having central CP that he describes as feeling like someone is stepping on his chest.  He has had associated shortness of breath and acid reflux symptoms.  These symptoms have been constant for the past 3 days.  Today he took Rolaids which relieved to the acid reflux but he states that the chest pain and shortness of breath have remained.  He has occasional cough but no other URI symptoms.  No sick contacts.  He vomited one time yesterday but no persistent vomiting and no diarrhea.  No fevers.  He admits to occasional noncompliance with his insulin, has not had any insulin today.  He checked his blood sugar yesterday and it was 236.  He endorses polyuria and polydipsia.  Denies any history of blood clots or recent travel.  He admits that he tried cocaine several days ago but has not had any since then and his symptoms did not start after using drugs.  The history is provided by the patient.  Chest Pain    Past Medical History:  Diagnosis Date  . Asthma   . DKA (diabetic ketoacidoses) (HCC) 06/2017; 10/17/2017  . DKA (diabetic ketoacidoses) (HCC) 02/2018  . Hyperglycemia 09/24/2017  . Obesity   . Type II diabetes mellitus (HCC)    "dx'd 06/2017"    Patient Active Problem List   Diagnosis Date Noted  . Hypokalemia 03/18/2018  . Normocytic anemia 03/18/2018  . Hyperglycemia   . CAP (community acquired pneumonia) 03/17/2018  . DKA (diabetic ketoacidoses) (HCC) 03/06/2018  . Right bundle branch block (RBBB) determined by electrocardiography 08/24/2017  . Type 2 diabetes mellitus (HCC) 08/01/2017  . Smoking  08/01/2017  . HTN (hypertension) 07/25/2017    Past Surgical History:  Procedure Laterality Date  . LACERATION REPAIR Left    "stitched finger up"  . TONSILLECTOMY          Home Medications    Prior to Admission medications   Medication Sig Start Date End Date Taking? Authorizing Provider  Insulin Isophane & Regular Human (NOVOLIN 70/30 FLEXPEN RELION) (70-30) 100 UNIT/ML PEN Inject 16 Units into the skin 2 (two) times daily. Patient taking differently: Inject 40 Units into the skin daily.  08/28/18  Yes Dimple Nanas, MD    Family History No family history on file.  Social History Social History   Tobacco Use  . Smoking status: Current Every Day Smoker    Packs/day: 0.10    Years: 12.00    Pack years: 1.20    Types: Cigarettes  . Smokeless tobacco: Never Used  . Tobacco comment: cutting back - now 1-2 cigarettes per day  Substance Use Topics  . Alcohol use: Yes    Comment: 10/17/2017 "2 shots of liquor/month; maybe"  . Drug use: Yes    Types: Marijuana    Comment: 10/17/2017 ~3 times per week     Allergies   Vicodin [hydrocodone-acetaminophen]; Eggs or egg-derived products; and Lactose intolerance (gi)   Review of Systems Review of Systems  Cardiovascular: Positive for chest pain.   All other systems reviewed and  are negative except that which was mentioned in HPI   Physical Exam Updated Vital Signs BP 119/90 (BP Location: Right Arm)   Pulse (!) 105   Temp 98.1 F (36.7 C) (Oral)   Resp 18   SpO2 99%   Physical Exam Vitals signs and nursing note reviewed.  Constitutional:      General: He is not in acute distress.    Appearance: He is well-developed.  HENT:     Head: Normocephalic and atraumatic.     Mouth/Throat:     Mouth: Mucous membranes are dry.  Eyes:     Conjunctiva/sclera: Conjunctivae normal.     Pupils: Pupils are equal, round, and reactive to light.  Neck:     Musculoskeletal: Neck supple.  Cardiovascular:     Rate and  Rhythm: Normal rate and regular rhythm.     Heart sounds: Normal heart sounds. No murmur.  Pulmonary:     Effort: Pulmonary effort is normal.     Breath sounds: Normal breath sounds.  Abdominal:     General: Bowel sounds are normal. There is no distension.     Palpations: Abdomen is soft.     Tenderness: There is no abdominal tenderness.  Musculoskeletal:     Right lower leg: No edema.     Left lower leg: No edema.  Skin:    General: Skin is warm and dry.  Neurological:     Mental Status: He is alert and oriented to person, place, and time.     Gait: Gait is intact.     Comments: Fluent speech  Psychiatric:        Mood and Affect: Mood normal.        Judgment: Judgment normal.      ED Treatments / Results  Labs (all labs ordered are listed, but only abnormal results are displayed) Labs Reviewed  BASIC METABOLIC PANEL - Abnormal; Notable for the following components:      Result Value   Sodium 131 (*)    CO2 8 (*)    Glucose, Bld 505 (*)    Anion gap 21 (*)    All other components within normal limits  CBC - Abnormal; Notable for the following components:   RBC 5.99 (*)    Hemoglobin 18.6 (*)    HCT 52.4 (*)    All other components within normal limits  CBG MONITORING, ED - Abnormal; Notable for the following components:   Glucose-Capillary 382 (*)    All other components within normal limits  CBG MONITORING, ED - Abnormal; Notable for the following components:   Glucose-Capillary 519 (*)    All other components within normal limits  POCT I-STAT EG7 - Abnormal; Notable for the following components:   pH, Ven 7.191 (*)    pCO2, Ven 41.6 (*)    pO2, Ven 20.0 (*)    Bicarbonate 15.9 (*)    TCO2 17 (*)    Acid-base deficit 12.0 (*)    All other components within normal limits  CBG MONITORING, ED - Abnormal; Notable for the following components:   Glucose-Capillary 273 (*)    All other components within normal limits  URINALYSIS, ROUTINE W REFLEX MICROSCOPIC   LACTIC ACID, PLASMA  LACTIC ACID, PLASMA  I-STAT TROPONIN, ED    EKG EKG Interpretation  Date/Time:  Saturday January 11 2019 16:36:33 EDT Ventricular Rate:  85 PR Interval:    QRS Duration: 135 QT Interval:  369 QTC Calculation: 439 R Axis:   -94 Text  Interpretation:  Sinus rhythm RBBB and LAFB ST elevation suggests acute pericarditis similar to previous Confirmed by Frederick Peers [id number]) on 01/11/2019 4:46:01 PM   Radiology Dg Chest 2 View  Result Date: 01/11/2019 CLINICAL DATA:  Shortness of breath and chest pressure EXAM: CHEST - 2 VIEW COMPARISON:  November 18, 2018. FINDINGS: There is no edema or consolidation. The heart size and pulmonary vascularity are normal. No adenopathy. No bone lesions. IMPRESSION: No edema or consolidation. Electronically Signed   By: Bretta Bang III M.D.   On: 01/11/2019 16:17    Procedures .Critical Care Performed by: Laurence Spates, MD Authorized by: Laurence Spates, MD   Critical care provider statement:    Critical care time (minutes):  35   Critical care time was exclusive of:  Separately billable procedures and treating other patients   Critical care was necessary to treat or prevent imminent or life-threatening deterioration of the following conditions:  Endocrine crisis   Critical care was time spent personally by me on the following activities:  Development of treatment plan with patient or surrogate, evaluation of patient's response to treatment, examination of patient, obtaining history from patient or surrogate, ordering and performing treatments and interventions, ordering and review of laboratory studies and re-evaluation of patient's condition   (including critical care time)  Medications Ordered in ED Medications  insulin regular, human (MYXREDLIN) 100 units/ 100 mL infusion ( Intravenous Stopped 01/11/19 2102)  dextrose 5 %-0.45 % sodium chloride infusion (has no administration in time range)  sodium chloride  0.9 % bolus 1,000 mL (0 mLs Intravenous Stopped 01/11/19 2102)    And  sodium chloride 0.9 % bolus 1,000 mL (0 mLs Intravenous Stopped 01/11/19 2102)    And  sodium chloride 0.9 % bolus 1,000 mL (0 mLs Intravenous Stopped 01/11/19 2102)    And  0.9 %  sodium chloride infusion (has no administration in time range)  sodium chloride flush (NS) 0.9 % injection 3 mL (3 mLs Intravenous Given 01/11/19 1540)  potassium chloride 10 mEq in 100 mL IVPB (0 mEq Intravenous Stopped 01/11/19 1939)     Initial Impression / Assessment and Plan / ED Course  I have reviewed the triage vital signs and the nursing notes.  Pertinent labs & imaging results that were available during my care of the patient were reviewed by me and considered in my medical decision making (see chart for details).        Ambulatory and nontoxic on my exam.  Vital signs notable for borderline tachycardia but otherwise reassuring.  I am concerned about the possibility of DKA given insulin noncompliance and polyuria/polydipsia.  Lab work shows negative troponin, BMP notable for normal creatinine, potassium 4.5, CO2 8, blood glucose 505, anion gap 21.  CBC shows normal WBC count, hemoglobin 18 which is likely due to hemoconcentration.  Initiated DKA protocol with several fluid boluses, maintenance fluids, insulin drip, and potassium repletion.  Given his complaints of chest pain and shortness of breath, his EKG has no ischemic changes and he has no major risk factors for ACS.  I considered the possibility of PE but he has no risk factors for blood clots and I feel his shortness of breath is very likely to compensation for his acidosis.  Venous pH 7.19 after the DKA protocol had been running for a few hours. I discussed admission with one of the admitting teams.  I had been called to the room earlier in the ED course because the patient had wanted  to leave.  I explained the life-threatening nature of DKA and he reluctantly agreed to stay after  I agreed to let him eat, explaining that it would drive of his blood sugar.  After initial discussion with team for admission, I was called back to the patient room again stating that he wanted to leave.  I had a long discussion with the patient regarding the life-threatening nature of DKA.  He voiced understanding risks of leaving including but not limited to worsening condition, permanent disability, or death.  I was unable to convince him to stay and patient signed out against medical advice.  Final Clinical Impressions(s) / ED Diagnoses   Final diagnoses:  Diabetic ketoacidosis without coma associated with type 1 diabetes mellitus (HCC)  Non compliance w medication regimen    ED Discharge Orders    None       Alano Blasco, Ambrose Finland, MD 01/11/19 2123

## 2019-01-11 NOTE — ED Notes (Signed)
Pt given bag lunch, crackers and cheese by NT. This RN unable to advise against items due to pt care in another room.

## 2019-01-11 NOTE — ED Notes (Signed)
Dr Little informed of I Stat 7 results 

## 2019-01-11 NOTE — ED Triage Notes (Signed)
Pt to ER for evaluation of central chest pressure states onset 3 days ago and was relieved with tums. States now he is short of breath. States checked his CBG yesterday and it was 236, is type II diabetic. States vomited one time yseterday.

## 2019-01-11 NOTE — ED Notes (Signed)
Pt refuses to remain in hospital or ED for further management of his diabetes. Little, MD spoke with pt, and pt continues to wish to leave AMA. Pt verbally understands and accepts risk of leaving AMA, signs AMA. Pt armband & labels removed and placed in shred bin. Pt departs in NAD, refused use of wheelchair.

## 2019-01-11 NOTE — ED Notes (Signed)
Pt cbg 519. Notified Kathlene November, Charity fundraiser.

## 2019-01-16 ENCOUNTER — Encounter (HOSPITAL_COMMUNITY): Payer: Self-pay

## 2019-01-16 ENCOUNTER — Emergency Department (HOSPITAL_COMMUNITY): Payer: Self-pay

## 2019-01-16 ENCOUNTER — Observation Stay (HOSPITAL_COMMUNITY)
Admission: EM | Admit: 2019-01-16 | Discharge: 2019-01-16 | Discharge status: Against Medical Advice | Payer: Self-pay | Attending: Internal Medicine | Admitting: Internal Medicine

## 2019-01-16 ENCOUNTER — Other Ambulatory Visit: Payer: Self-pay

## 2019-01-16 DIAGNOSIS — I10 Essential (primary) hypertension: Secondary | ICD-10-CM | POA: Insufficient documentation

## 2019-01-16 DIAGNOSIS — Z5329 Procedure and treatment not carried out because of patient's decision for other reasons: Secondary | ICD-10-CM

## 2019-01-16 DIAGNOSIS — J45909 Unspecified asthma, uncomplicated: Secondary | ICD-10-CM | POA: Insufficient documentation

## 2019-01-16 DIAGNOSIS — F17210 Nicotine dependence, cigarettes, uncomplicated: Secondary | ICD-10-CM

## 2019-01-16 DIAGNOSIS — E1010 Type 1 diabetes mellitus with ketoacidosis without coma: Secondary | ICD-10-CM

## 2019-01-16 DIAGNOSIS — I452 Bifascicular block: Secondary | ICD-10-CM

## 2019-01-16 DIAGNOSIS — E739 Lactose intolerance, unspecified: Secondary | ICD-10-CM

## 2019-01-16 DIAGNOSIS — R632 Polyphagia: Secondary | ICD-10-CM | POA: Insufficient documentation

## 2019-01-16 DIAGNOSIS — Z91012 Allergy to eggs: Secondary | ICD-10-CM

## 2019-01-16 DIAGNOSIS — R0789 Other chest pain: Secondary | ICD-10-CM | POA: Insufficient documentation

## 2019-01-16 DIAGNOSIS — Z885 Allergy status to narcotic agent status: Secondary | ICD-10-CM

## 2019-01-16 DIAGNOSIS — R1013 Epigastric pain: Secondary | ICD-10-CM

## 2019-01-16 DIAGNOSIS — E1110 Type 2 diabetes mellitus with ketoacidosis without coma: Principal | ICD-10-CM | POA: Diagnosis present

## 2019-01-16 DIAGNOSIS — Z9114 Patient's other noncompliance with medication regimen: Secondary | ICD-10-CM | POA: Insufficient documentation

## 2019-01-16 DIAGNOSIS — Z794 Long term (current) use of insulin: Secondary | ICD-10-CM | POA: Insufficient documentation

## 2019-01-16 LAB — COMPREHENSIVE METABOLIC PANEL
ALT: 12 U/L (ref 0–44)
AST: 16 U/L (ref 15–41)
Albumin: 3.7 g/dL (ref 3.5–5.0)
Alkaline Phosphatase: 104 U/L (ref 38–126)
Anion gap: 20 — ABNORMAL HIGH (ref 5–15)
BUN: 15 mg/dL (ref 6–20)
CO2: 13 mmol/L — AB (ref 22–32)
Calcium: 9.4 mg/dL (ref 8.9–10.3)
Chloride: 97 mmol/L — ABNORMAL LOW (ref 98–111)
Creatinine, Ser: 0.99 mg/dL (ref 0.61–1.24)
GFR calc Af Amer: >60 mL/min (ref >60)
GFR calc non Af Amer: >60 mL/min (ref >60)
Glucose, Bld: 424 mg/dL — ABNORMAL HIGH (ref 70–99)
Potassium: 4.5 mmol/L (ref 3.5–5.1)
Sodium: 130 mmol/L — ABNORMAL LOW (ref 135–145)
Total Bilirubin: 1.8 mg/dL — ABNORMAL HIGH (ref 0.3–1.2)
Total Protein: 6.9 g/dL (ref 6.5–8.1)

## 2019-01-16 LAB — CBG MONITORING, ED
Glucose-Capillary: 431 mg/dL — ABNORMAL HIGH (ref 70–99)
Glucose-Capillary: 395 mg/dL — ABNORMAL HIGH (ref 70–99)
Glucose-Capillary: 319 mg/dL — ABNORMAL HIGH (ref 70–99)

## 2019-01-16 LAB — BASIC METABOLIC PANEL
Anion gap: 19 — ABNORMAL HIGH (ref 5–15)
Anion gap: 10 (ref 5–15)
BUN: 15 mg/dL (ref 6–20)
BUN: 10 mg/dL (ref 6–20)
CO2: 11 mmol/L — AB (ref 22–32)
CO2: 17 mmol/L — AB (ref 22–32)
Calcium: 8.8 mg/dL — ABNORMAL LOW (ref 8.9–10.3)
Calcium: 8.7 mg/dL — ABNORMAL LOW (ref 8.9–10.3)
Chloride: 101 mmol/L (ref 98–111)
Chloride: 108 mmol/L (ref 98–111)
Creatinine, Ser: 0.93 mg/dL (ref 0.61–1.24)
Creatinine, Ser: 0.84 mg/dL (ref 0.61–1.24)
GFR calc Af Amer: >60 mL/min (ref >60)
GFR calc Af Amer: >60 mL/min (ref >60)
GFR calc non Af Amer: >60 mL/min (ref >60)
GFR calc non Af Amer: >60 mL/min (ref >60)
GLUCOSE: 186 mg/dL — AB (ref 70–99)
Glucose, Bld: 350 mg/dL — ABNORMAL HIGH (ref 70–99)
Potassium: 4.4 mmol/L (ref 3.5–5.1)
Potassium: 3.8 mmol/L (ref 3.5–5.1)
Sodium: 131 mmol/L — ABNORMAL LOW (ref 135–145)
Sodium: 135 mmol/L (ref 135–145)

## 2019-01-16 LAB — POCT I-STAT EG7
Acid-base deficit: 8 mmol/L — ABNORMAL HIGH (ref 0.0–2.0)
Bicarbonate: 16.3 mmol/L — ABNORMAL LOW (ref 20.0–28.0)
Calcium, Ion: 1.19 mmol/L (ref 1.15–1.40)
HCT: 48 % (ref 39.0–52.0)
HEMOGLOBIN: 16.3 g/dL (ref 13.0–17.0)
O2 Saturation: 99 %
Potassium: 4.5 mmol/L (ref 3.5–5.1)
Sodium: 130 mmol/L — ABNORMAL LOW (ref 135–145)
TCO2: 17 mmol/L — ABNORMAL LOW (ref 22–32)
pCO2, Ven: 31.3 mmHg — ABNORMAL LOW (ref 44.0–60.0)
pH, Ven: 7.324 (ref 7.250–7.430)
pO2, Ven: 146 mmHg — ABNORMAL HIGH (ref 32.0–45.0)

## 2019-01-16 LAB — CBC WITH DIFFERENTIAL/PLATELET
Abs Immature Granulocytes: 0.03 10*3/uL (ref 0.00–0.07)
Basophils Absolute: 0.1 10*3/uL (ref 0.0–0.1)
Basophils Relative: 1 %
Eosinophils Absolute: 0.4 10*3/uL (ref 0.0–0.5)
Eosinophils Relative: 5 %
HCT: 47.3 % (ref 39.0–52.0)
Hemoglobin: 16.6 g/dL (ref 13.0–17.0)
Immature Granulocytes: 0 %
Lymphocytes Relative: 45 %
Lymphs Abs: 4.1 10*3/uL — ABNORMAL HIGH (ref 0.7–4.0)
MCH: 31 pg (ref 26.0–34.0)
MCHC: 35.1 g/dL (ref 30.0–36.0)
MCV: 88.2 fL (ref 80.0–100.0)
Monocytes Absolute: 0.4 10*3/uL (ref 0.1–1.0)
Monocytes Relative: 5 %
NRBC: 0 % (ref 0.0–0.2)
Neutro Abs: 3.9 10*3/uL (ref 1.7–7.7)
Neutrophils Relative %: 44 %
Platelets: 249 10*3/uL (ref 150–400)
RBC: 5.36 MIL/uL (ref 4.22–5.81)
RDW: 11.6 % (ref 11.5–15.5)
WBC: 9 10*3/uL (ref 4.0–10.5)

## 2019-01-16 LAB — MRSA PCR SCREENING: MRSA by PCR: NEGATIVE

## 2019-01-16 LAB — TROPONIN I: Troponin I: <0.03 ng/mL (ref <0.03)

## 2019-01-16 LAB — GLUCOSE, CAPILLARY
Glucose-Capillary: 265 mg/dL — ABNORMAL HIGH (ref 70–99)
Glucose-Capillary: 279 mg/dL — ABNORMAL HIGH (ref 70–99)
Glucose-Capillary: 182 mg/dL — ABNORMAL HIGH (ref 70–99)
Glucose-Capillary: 118 mg/dL — ABNORMAL HIGH (ref 70–99)
Glucose-Capillary: 135 mg/dL — ABNORMAL HIGH (ref 70–99)
Glucose-Capillary: 185 mg/dL — ABNORMAL HIGH (ref 70–99)

## 2019-01-16 LAB — BETA-HYDROXYBUTYRIC ACID: Beta-Hydroxybutyric Acid: >8 mmol/L — ABNORMAL HIGH (ref 0.05–0.27)

## 2019-01-16 MED ORDER — INSULIN REGULAR(HUMAN) IN NACL 100-0.9 UT/100ML-% IV SOLN
INTRAVENOUS | Status: DC
  Administered 2019-01-16: 3.4 [IU]/h via INTRAVENOUS
  Filled 2019-01-16: qty 100

## 2019-01-16 MED ORDER — POTASSIUM CHLORIDE 10 MEQ/100ML IV SOLN
10.0000 meq | INTRAVENOUS | Status: AC
  Administered 2019-01-16 (×2): 10 meq via INTRAVENOUS
  Filled 2019-01-16: qty 100

## 2019-01-16 MED ORDER — ACETAMINOPHEN 325 MG PO TABS
650.0000 mg | ORAL_TABLET | Freq: Four times a day (QID) | ORAL | Status: DC | PRN
  Administered 2019-01-16: 650 mg via ORAL
  Filled 2019-01-16: qty 2

## 2019-01-16 MED ORDER — SODIUM CHLORIDE 0.9 % IV BOLUS
1000.0000 mL | Freq: Once | INTRAVENOUS | Status: AC
  Administered 2019-01-16: 1000 mL via INTRAVENOUS

## 2019-01-16 MED ORDER — SODIUM CHLORIDE 0.9 % IV SOLN
INTRAVENOUS | Status: DC
  Administered 2019-01-16: 13:00:00 via INTRAVENOUS

## 2019-01-16 MED ORDER — ENOXAPARIN SODIUM 40 MG/0.4ML ~~LOC~~ SOLN
40.0000 mg | SUBCUTANEOUS | Status: DC
  Administered 2019-01-16: 40 mg via SUBCUTANEOUS
  Filled 2019-01-16: qty 0.4

## 2019-01-16 MED ORDER — DEXTROSE-NACL 5-0.45 % IV SOLN: INTRAVENOUS | Status: DC

## 2019-01-16 MED ORDER — ASPIRIN 325 MG PO TABS
325.0000 mg | ORAL_TABLET | Freq: Every day | ORAL | Status: DC
  Filled 2019-01-16: qty 1

## 2019-01-16 MED ORDER — INSULIN REGULAR BOLUS VIA INFUSION
0.0000 [IU] | Freq: Three times a day (TID) | INTRAVENOUS | Status: DC
  Filled 2019-01-16: qty 10

## 2019-01-16 MED ORDER — POTASSIUM CHLORIDE 10 MEQ/100ML IV SOLN
10.0000 meq | INTRAVENOUS | Status: DC
  Filled 2019-01-16: qty 100

## 2019-01-16 MED ORDER — DEXTROSE 50 % IV SOLN: 25.0000 mL | INTRAVENOUS | Status: DC | PRN

## 2019-01-16 MED ORDER — ALUM & MAG HYDROXIDE-SIMETH 200-200-20 MG/5ML PO SUSP: 30.0000 mL | Freq: Once | ORAL | Status: DC

## 2019-01-16 MED ORDER — DEXTROSE-NACL 5-0.45 % IV SOLN
INTRAVENOUS | Status: DC
  Administered 2019-01-16: 1000 mL via INTRAVENOUS

## 2019-01-16 MED ORDER — SODIUM CHLORIDE 0.9 % IV SOLN
INTRAVENOUS | Status: DC
  Administered 2019-01-16: 12:00:00 via INTRAVENOUS

## 2019-01-16 NOTE — ED Notes (Signed)
ED Provider at bedside. 

## 2019-01-16 NOTE — ED Notes (Signed)
ED TO INPATIENT HANDOFF REPORT  ED Nurse Name and Phone #: [phone] Zettie Cooley Name/Age/Gender [name] 28 y.o. male Room/Bed: 026C/026C  Code Status   Code Status: Full Code  Home/SNF/Other Home Patient oriented to: self, place, time and situation Is this baseline? Yes   Triage Complete: Triage complete  Chief Complaint CP  Triage Note Pt arrived via [facility]; pt from home with c/o CP x 2days; pt left hosp AMA 2 days ago and was also in DKA but rec'd no tx per EMS; Pt c/o increased wkness and not feeling good. Hx of Dm, RBBB; pt states he has not had insulin and had not eating; 118/60, 92, 98 % on RA, 14, CBG 436, 20G LFA   Allergies Allergies  Allergen Reactions  . Vicodin [Hydrocodone-Acetaminophen] Anaphylaxis    Tolerates tylenol with no allergy  . Eggs Or Egg-Derived Products Nausea And Vomiting  . Lactose Intolerance (Gi) Rash    Level of Care/Admitting Diagnosis ED Disposition    ED Disposition Condition Comment   Admit  Hospital Area: [facility] [[id number]]  Level of Care: Progressive [102]  Diagnosis: DKA (diabetic ketoacidoses) [facility]) [[id number]]  Admitting Physician: Earl Lagos [phone]  Attending Physician: Earl Lagos [[id number]]  PT Class (Do Not Modify): Observation [104]  PT Acc Code (Do Not Modify): Observation [10022]       B Medical/Surgery History Past Medical History:  Diagnosis Date  . Asthma   . DKA (diabetic ketoacidoses) (HCC) 06/2017; 10/17/2017  . DKA (diabetic ketoacidoses) (HCC) 02/2018  . Hyperglycemia 09/24/2017  . Obesity   . Type II diabetes mellitus (HCC)    "dx'd 06/2017"   Past Surgical History:  Procedure Laterality Date  . LACERATION REPAIR Left    "stitched finger up"  . TONSILLECTOMY       A IV Location/Drains/Wounds Patient Lines/Drains/Airways Status   Active Line/Drains/Airways    Name:   Placement date:   Placement time:   Site:   Days:   Peripheral IV 01/16/19 Left  Antecubital   01/16/19    1010    Antecubital   less than 1   Peripheral IV 01/16/19 Left Antecubital   01/16/19    1244    Antecubital   less than 1          Intake/Output Last 24 hours  Intake/Output Summary (Last 24 hours) at 01/16/2019 1332 Last data filed at 01/16/2019 1228 Gross per 24 hour  Intake 1000 ml  Output 1100 ml  Net -100 ml    Labs/Imaging Results for orders placed or performed during the hospital encounter of 01/16/19 (from the past 48 hour(s))  CBG monitoring, ED     Status: Abnormal   Collection Time: 01/16/19 10:10 AM  Result Value Ref Range   Glucose-Capillary 431 (H) 70 - 99 mg/dL   Comment 1 Notify RN    Comment 2 Document in Chart   CBC with Differential     Status: Abnormal   Collection Time: 01/16/19 10:19 AM  Result Value Ref Range   WBC 9.0 4.0 - 10.5 K/uL   RBC 5.36 4.22 - 5.81 MIL/uL   Hemoglobin 16.6 13.0 - 17.0 g/dL   HCT 44.8 18.5 - 63.1 %   MCV 88.2 80.0 - 100.0 fL   MCH 31.0 26.0 - 34.0 pg   MCHC 35.1 30.0 - 36.0 g/dL   RDW 49.7 02.6 - 37.8 %   Platelets 249 150 - 400 K/uL   nRBC 0.0 0.0 - 0.2 %  Neutrophils Relative % 44 %   Neutro Abs 3.9 1.7 - 7.7 K/uL   Lymphocytes Relative 45 %   Lymphs Abs 4.1 (H) 0.7 - 4.0 K/uL   Monocytes Relative 5 %   Monocytes Absolute 0.4 0.1 - 1.0 K/uL   Eosinophils Relative 5 %   Eosinophils Absolute 0.4 0.0 - 0.5 K/uL   Basophils Relative 1 %   Basophils Absolute 0.1 0.0 - 0.1 K/uL   Immature Granulocytes 0 %   Abs Immature Granulocytes 0.03 0.00 - 0.07 K/uL    Comment: Performed at [facility], [address] [place], [place] [address]  Comprehensive metabolic panel     Status: Abnormal   Collection Time: 01/16/19 10:19 AM  Result Value Ref Range   Sodium 130 (L) 135 - 145 mmol/L   Potassium 4.5 3.5 - 5.1 mmol/L   Chloride 97 (L) 98 - 111 mmol/L   CO2 13 (L) 22 - 32 mmol/L   Glucose, Bld 424 (H) 70 - 99 mg/dL   BUN 15 6 - 20 mg/dL   Creatinine, Ser 6.04 0.61 - 1.24 mg/dL   Calcium  9.4 8.9 - 54.0 mg/dL   Total Protein 6.9 6.5 - 8.1 g/dL   Albumin 3.7 3.5 - 5.0 g/dL   AST 16 15 - 41 U/L   ALT 12 0 - 44 U/L   Alkaline Phosphatase 104 38 - 126 U/L   Total Bilirubin 1.8 (H) 0.3 - 1.2 mg/dL   GFR calc non Af Amer >60 >60 mL/min   GFR calc Af Amer >60 >60 mL/min   Anion gap 20 (H) 5 - 15    Comment: Performed at [facility], [address] [place], [place] [address]  Troponin I - Now Then Q3H     Status: None   Collection Time: 01/16/19 10:19 AM  Result Value Ref Range   Troponin I <0.03 <0.03 ng/mL    Comment: Performed at [facility], [address], [place], [place] [address]  POCT I-Stat EG7     Status: Abnormal   Collection Time: 01/16/19 11:11 AM  Result Value Ref Range   pH, Ven 7.324 7.250 - 7.430   pCO2, Ven 31.3 (L) 44.0 - 60.0 mmHg   pO2, Ven 146.0 (H) 32.0 - 45.0 mmHg   Bicarbonate 16.3 (L) 20.0 - 28.0 mmol/L   TCO2 17 (L) 22 - 32 mmol/L   O2 Saturation 99.0 %   Acid-base deficit 8.0 (H) 0.0 - 2.0 mmol/L   Sodium 130 (L) 135 - 145 mmol/L   Potassium 4.5 3.5 - 5.1 mmol/L   Calcium, Ion 1.19 1.15 - 1.40 mmol/L   HCT 48.0 39.0 - 52.0 %   Hemoglobin 16.3 13.0 - 17.0 g/dL   Patient temperature HIDE    Sample type VENOUS   CBG monitoring, ED     Status: Abnormal   Collection Time: 01/16/19 12:21 PM  Result Value Ref Range   Glucose-Capillary 395 (H) 70 - 99 mg/dL   Dg Chest Portable 1 View  Result Date: 01/16/2019 CLINICAL DATA:  Chest pain. EXAM: PORTABLE CHEST 1 VIEW COMPARISON:  01/11/2019. FINDINGS: Mediastinum and hilar structures normal. Lungs are clear. No pleural effusion or pneumothorax. Heart size normal. No acute bony abnormality. IMPRESSION: No acute cardiopulmonary disease. Electronically Signed   By: Maisie Fus  Register   On: 01/16/2019 10:53    Pending Labs [employer] (From admission, onward)    Start     Ordered  01/16/19 1246  Beta-hydroxybutyric acid  Once,   R     01/16/19 1245   01/16/19 1229  Basic metabolic  panel  STAT Now then every 4 hours ,   STAT     01/16/19 1231   01/16/19 1021  Urinalysis, Routine w reflex microscopic  Once,   R     01/16/19 1023   01/16/19 1021  Rapid urine drug screen (hospital performed)  ONCE - STAT,   R     01/16/19 1023   01/16/19 1020  Troponin I - Now Then Q3H  Now then every 3 hours,   STAT     01/16/19 1023          Vitals/Pain Today's Vitals   01/16/19 1130 01/16/19 1217 01/16/19 1230 01/16/19 1315  BP: 116/86  124/80 120/69  Pulse: 91  83 82  Resp: 17  18 (!) 23  Temp:      TempSrc:      SpO2: 98%  99% 99%  Weight:  72.6 kg    Height:  5\' 6"  (1.676 m)    PainSc:        Isolation Precautions No active isolations  Medications Medications  aspirin tablet 325 mg (325 mg Oral Not Given 01/16/19 1124)  insulin regular, human (MYXREDLIN) 100 units/ 100 mL infusion (3.4 Units/hr Intravenous New Bag/Given 01/16/19 1230)  dextrose 50 % solution 25 mL (has no administration in time range)  sodium chloride 0.9 % bolus 1,000 mL (has no administration in time range)  0.9 %  sodium chloride infusion ( Intravenous New Bag/Given 01/16/19 1244)  dextrose 5 %-0.45 % sodium chloride infusion (has no administration in time range)  enoxaparin (LOVENOX) injection 40 mg (has no administration in time range)  potassium chloride 10 mEq in 100 mL IVPB (has no administration in time range)  alum & mag hydroxide-simeth (MAALOX/MYLANTA) 200-200-20 MG/5ML suspension 30 mL (has no administration in time range)  sodium chloride 0.9 % bolus 1,000 mL (0 mLs Intravenous Stopped 01/16/19 1124)    Mobility walks Low fall risk   Focused Assessments Cardiac Assessment Handoff:  Cardiac Rhythm: Normal sinus rhythm Lab Results  Component Value Date   TROPONINI <0.03 01/16/2019   No results found for: DDIMER Does the Patient currently have chest pain? Yes     R Recommendations: See Admitting Provider Note  Report given to:   Additional Notes:  Pt alert, oriented  and ambulatory, hx DKA, pt on glucostabilizer, pt to receive mylanta for chest discomfort (described as reflux by pt) once verified by pharmacy

## 2019-01-16 NOTE — ED Notes (Signed)
Pt CBG was 431, notified Jessica(RN)

## 2019-01-16 NOTE — ED Provider Notes (Signed)
[facility] EMERGENCY DEPARTMENT Provider Note   CSN: [id number] Arrival date & time: 01/16/19  1002    History   Chief Complaint Chief Complaint  Patient presents with  . Chest Pain  . Weakness    HPI [name] is a 28 y.o. male.     HPI  [facility] that began last night.   Pt states the pain began in his sleep and actually woke him from sleeping.   States pain is "like pressure", non-radiating, constant but worse with exertion with associated shob.   He has PMH of IDDM and known non-compliance.   He states that his mother had CABG at 24.    He denies ever having a previous stress test.   Of note, pt was here 5 days ago and was diagnosed with DKA and left AMA.   He does not currently appear to be in distress.    Past Medical History:  Diagnosis Date  . Asthma   . DKA (diabetic ketoacidoses) (HCC) 06/2017; 10/17/2017  . DKA (diabetic ketoacidoses) (HCC) 02/2018  . Hyperglycemia 09/24/2017  . Obesity   . Type II diabetes mellitus (HCC)    "dx'd 06/2017"    Patient Active Problem List   Diagnosis Date Noted  . Hypokalemia 03/18/2018  . Normocytic anemia 03/18/2018  . Hyperglycemia   . CAP (community acquired pneumonia) 03/17/2018  . DKA (diabetic ketoacidoses) (HCC) 03/06/2018  . Right bundle branch block (RBBB) determined by electrocardiography 08/24/2017  . Type 2 diabetes mellitus (HCC) 08/01/2017  . Smoking 08/01/2017  . HTN (hypertension) 07/25/2017    Past Surgical History:  Procedure Laterality Date  . LACERATION REPAIR Left    "stitched finger up"  . TONSILLECTOMY          Home Medications    Prior to Admission medications   Medication Sig Start Date End Date Taking? Authorizing Provider  Insulin Isophane & Regular Human (NOVOLIN 70/30 FLEXPEN RELION) (70-30) 100 UNIT/ML PEN Inject 16 Units into the skin 2 (two) times daily. Patient taking differently: Inject 40 Units into the skin  daily.  08/28/18  Yes Dimple Nanas, MD    Family History History reviewed. No pertinent family history.   Social History Social History   Tobacco Use  . Smoking status: Current Every Day Smoker    Packs/day: 0.10    Years: 12.00    Pack years: 1.20    Types: Cigarettes  . Smokeless tobacco: Never Used  . Tobacco comment: cutting back - now 1-2 cigarettes per day  Substance Use Topics  . Alcohol use: Yes    Comment: 10/17/2017 "2 shots of liquor/month; maybe"  . Drug use: Yes    Types: Marijuana    Comment: 10/17/2017 ~3 times per week     Allergies   Vicodin [hydrocodone-acetaminophen]; Eggs or egg-derived products; and Lactose intolerance (gi)   Review of Systems Review of Systems  Respiratory: Positive for shortness of breath.   Cardiovascular: Positive for chest pain.  All other systems reviewed and are negative.    Physical Exam Updated Vital Signs BP 118/73   Pulse 81   Temp 98.1 F (36.7 C) (Oral)   Resp (!) 22   SpO2 100%   Physical Exam Vitals signs and nursing note reviewed.  HENT:     Head: Normocephalic.  Neck:     Musculoskeletal: Normal range of motion.  Cardiovascular:     Rate and Rhythm: Normal rate.  Heart sounds: Normal heart sounds.  Pulmonary:     Effort: Pulmonary effort is normal.  Chest:     Chest wall: Tenderness present.  Abdominal:     Palpations: Abdomen is soft.  Neurological:     Mental Status: He is alert.      ED Treatments / Results  Labs (all labs ordered are listed, but only abnormal results are displayed) Labs Reviewed  CBC WITH DIFFERENTIAL/PLATELET - Abnormal; Notable for the following components:      Result Value   Lymphs Abs 4.1 (*)    All other components within normal limits  COMPREHENSIVE METABOLIC PANEL - Abnormal; Notable for the following components:   Sodium 130 (*)    Chloride 97 (*)    CO2 13 (*)    Glucose, Bld 424 (*)    Total Bilirubin 1.8 (*)    Anion gap 20 (*)    All  other components within normal limits  CBG MONITORING, ED - Abnormal; Notable for the following components:   Glucose-Capillary 431 (*)    All other components within normal limits  POCT I-STAT EG7 - Abnormal; Notable for the following components:   pCO2, Ven 31.3 (*)    pO2, Ven 146.0 (*)    Bicarbonate 16.3 (*)    TCO2 17 (*)    Acid-base deficit 8.0 (*)    Sodium 130 (*)    All other components within normal limits  TROPONIN I  TROPONIN I  URINALYSIS, ROUTINE W REFLEX MICROSCOPIC  RAPID URINE DRUG SCREEN, HOSP PERFORMED  I-STAT VENOUS BLOOD GAS, ED    EKG EKG Interpretation  Date/Time:  Thursday January 16 2019 10:08:46 EDT Ventricular Rate:  89 PR Interval:    QRS Duration: 138 QT Interval:  361 QTC Calculation: 440 R Axis:   -84 Text Interpretation:  Sinus rhythm RBBB and LAFB No significant change was found Confirmed by Azalia Bilis ([id number]) on 01/16/2019 10:15:03 AM   Radiology Dg Chest Portable 1 View  Result Date: 01/16/2019 CLINICAL DATA:  Chest pain. EXAM: PORTABLE CHEST 1 VIEW COMPARISON:  01/11/2019. FINDINGS: Mediastinum and hilar structures normal. Lungs are clear. No pleural effusion or pneumothorax. Heart size normal. No acute bony abnormality. IMPRESSION: No acute cardiopulmonary disease. Electronically Signed   By: Maisie Fus  Register   On: 01/16/2019 10:53    Procedures .Critical Care Performed by: Rollen Sox, PA-C Authorized by: Rollen Sox, PA-C   Critical care provider statement:    Critical care time (minutes):  35   Critical care was time spent personally by me on the following activities:  Discussions with consultants, evaluation of patient's response to treatment, examination of patient, ordering and performing treatments and interventions, ordering and review of laboratory studies, ordering and review of radiographic studies, pulse oximetry, re-evaluation of patient's condition, obtaining history from patient or surrogate and review of old  charts   (including critical care time)  Medications Ordered in ED Medications  aspirin tablet 325 mg (has no administration in time range)  sodium chloride 0.9 % bolus 1,000 mL (1,000 mLs Intravenous New Bag/Given 01/16/19 1054)     Initial Impression / Assessment and Plan / ED Course  I have reviewed the triage vital signs and the nursing notes.  Pertinent labs & imaging results that were available during my care of the patient were reviewed by me and considered in my medical decision making (see chart for details).  Clinical Course as of Jan 15 1201  Thu Jan 16, 2019  1052  I-Stat venous blood gas, ED [AN]  1052 CBC with Differential(!) [AN]    Clinical Course User Index [AN] Reign Bartnick S, PA-C      Case reviewed with Dr. Patria Mane.   Pt with DKA.   He is agreeable to admission.  States that he will not leave AMA.  Final Clinical Impressions(s) / ED Diagnoses   Final diagnoses:  Diabetic ketoacidosis without coma associated with type 1 diabetes mellitus [facility])    ED Discharge Orders    None       Aella Ronda, Binnie Rail, PA-C 01/16/19 1204    Azalia Bilis, MD 01/16/19 1303

## 2019-01-16 NOTE — Progress Notes (Addendum)
Pt. Wants to leave AMA. Refusing treatment. On call notified via Amion. On-call Internist Criss Alvine is aware. Patient was educated and IVs were removed. AMA form was signed and placed in pt.'s chart. 1936 Vitals were T: 98.4, BP: 123/91, P: 70, oxygen sat of 100, R:12.

## 2019-01-16 NOTE — Progress Notes (Signed)
Per nursing staff, [name] refused all treatment and was "walking out the door" after being educated on the need for his to stay and the risks of refusing treatment. AMA form was signed and patient had left the unit before I could speak with him personally.

## 2019-01-16 NOTE — ED Triage Notes (Signed)
Pt arrived via [facility]; pt from home with c/o CP x 2days; pt left hosp AMA 2 days ago and was also in DKA but rec'd no tx per EMS; Pt c/o increased wkness and not feeling good. Hx of Dm, RBBB; pt states he has not had insulin and had not eating; 118/60, 92, 98 % on RA, 14, CBG 436, 20G LFA

## 2019-01-16 NOTE — H&P (Signed)
Date: 01/16/2019               Patient Name:  [name] MRN: [id number]  DOB: 1990-11-16 Age / Sex: 28 y.o., male   PCP: Patient, No Pcp Per         Medical Service: Internal Medicine Teaching Service         Attending Physician: Dr. Earl Lagos, MD    First Contact: Dr. Avie Arenas Pager: [phone]  Second Contact: Dr. Crista Elliot Pager: [phone]       After Hours (After 5p/  First Contact Pager: [phone]  weekends / holidays): Second Contact Pager: [phone]   Chief Complaint: chest pain  History of Present Illness: [name] is a 28 yo man with a medical history of uncontrolled, insulin dependent DMII with recurrent episodes of DKA, asthma, and HTN who presented to the ED with chest pain since last night. He describes the chest pain as non-radiating pressure/burning in the middle of his chest. The pain worsens with exertion and deep breaths and relieves with rest. He endorses exertional dyspnea that occurs concurrently with the chest pain. He has experienced this pain before, most recently five days ago when he came to the ED in DKA. This pain resolved when he left the hospital and then started again last night. He denies nausea, vomiting, diaphoresis. He states that the pain feels like acid reflux, but it is not associated with eating. He has tried tums at home, which does help the pain. He endorses early satiety. He denies hematemesis, hematochezia, or melena.  [name] has a history of recurrent DKA due to insulin noncompliance. Today he endorses polydipsia, polyphagia, and polyuria. He last took insulin two days ago and he admits to taking it sporadically. He states that he is supposed to take 60u novolin BID with meals, but when he does this he feels sweaty. He only administers 40u when he does take insulin. He reports that his average blood sugar at home is in the 500s, but he rarely checks.   Upon arrival to the ED, he was afebrile and hemodynamically stable. Labs  significant for blood sugar of 424, Na 130, bicab 13, total bili 1.8, anion gap 20, and negative troponin. Venous blood gas with pH 7.3, pCO2 31, and bicarb 16. EKG with RBBB and LAFB. CXR without edema or opacities. He received aspirin, 1L NS, potassium replacement and was started on an insulin drip in the ED.   Meds:  Current Meds  Medication Sig  . Insulin Isophane & Regular Human (NOVOLIN 70/30 FLEXPEN RELION) (70-30) 100 UNIT/ML PEN Inject 16 Units into the skin 2 (two) times daily. (Patient taking differently: Inject 40 Units into the skin daily. )   Allergies: Allergies as of 01/16/2019 - Review Complete 01/16/2019  Allergen Reaction Noted  . Vicodin [hydrocodone-acetaminophen] Anaphylaxis 01/22/2014  . Eggs or egg-derived products Nausea And Vomiting 07/25/2017  . Lactose intolerance (gi) Rash 01/22/2014   Past Medical History:  Diagnosis Date  . Asthma   . DKA (diabetic ketoacidoses) (HCC) 06/2017; 10/17/2017  . DKA (diabetic ketoacidoses) (HCC) 02/2018  . Hyperglycemia 09/24/2017  . Obesity   . Type II diabetes mellitus (HCC)    "dx'd 06/2017"   Past Surgical History:  Procedure Laterality Date  . LACERATION REPAIR Left    "stitched finger up"  . TONSILLECTOMY      Family History:  Extensive family history of DMII on both maternal and paternal sides. Mother had a "mini heart attack" and CABG  at age 28.  Social History: Lives with his mother. Currently between jobs. Smokes 1/2 pack of cigarettes per day. Infrequent etoh use. Infrequent marijuana use. Denies cocaine or other illicit drugs.   Review of Systems: A complete ROS was negative except as per HPI.   Physical Exam: Blood pressure 120/69, pulse 82, temperature 98.1 F (36.7 C), temperature source Oral, resp. rate (!) 23, height 5\' 6"  (1.676 m), weight 72.6 kg, SpO2 99 %.   Constitutional: Well-developed, well-nourished, and in no distress.  HEENT: Pupils are equal, round, and reactive to light. EOM are  normal. Moist mucous membranes. Cardiovascular: Normal rate and regular rhythm. No murmurs, rubs, or gallops. Pulmonary/Chest: Effort normal, no Kussmaul's respirations. Clear to auscultation bilaterally. No wheezes, rales, or rhonchi.  Abdominal: Bowel sounds present. Soft, non-distended. Epigastric ttp. Ext: No lower extremity edema. Skin: Warm and dry. No rashes or wounds.  EKG: personally reviewed my interpretation is no changes from prior to indicate ischemia.   CXR: personally reviewed my interpretation is no edema or opacities.  Assessment & Plan by Problem: Active Problems:   DKA (diabetic ketoacidoses) [facility])  [name] is a 28 yo man with a medical history of uncontrolled, insulin dependent DMII with recurrent episodes of DKA, asthma, and HTN who presented to the ED with chest pain, dyspnea, polydipsia, polyphagia, and polyuria. His initial labs showed an anion gap metabolic acidosis with a bicarb of 13, anion gap of 20, normal potassium, and glucose of 424. His presentation is consistent with DKA in the setting of insulin non-adherence.  DKA - Patient is currently afebrile and hemodynamically stable. He is oxygenating well on room air without increased respiratory effort or tachypnea. He does not have nausea or vomiting. He does not appear dry.  - He has had recurrent admissions for DKA in the past for medication noncompliance. He has been taking insulin sporadically and different than prescribed.  - A1c was 12.8 five months ago. There is question of whether this is type 1 versus type 2 diabetes based on his young age, insulin dependence, and recurrent DKA. However, C-peptide levels in the past were normal, the patient was obese at the time of diagnosis, and he has a strong family history of DMII. Per chart review, he has lost 100lbs in the past year. Therefore, he most likely has DMII. There are no antibodies in the system.  - Patient will require insulin education. He was  prescribed 16u BID, but was taking 60u BID. This likely precipitated hypoglycemia, which caused his episodes of sweating and weakness.  Plan - Beta hydroxybutyrate level - Insulin drip - BMP q4hrs - IVF. NS until CBG < 250, then transition to D5 1/2NS. - Replace K as needed  - UA  - NPO - Tele  Atypical chest pain - EKG without changes from prior to suggest ischemia. Initial troponin negative.  - Patient has epigastric pain on exam. He describes his pain as burning that is not associated with eating, but improves with tums. Ddx includes GERD, PUD, pancreatitis (patient does not have a history of heavy alcohol use), and diabetic gastroparesis.  Plan - Trend troponin - GI cocktail  FEN: NS at 200cc/hr, NPO, replace electrolytes as needed  DVT ppx: Lovenox Code status: FULL code  Dispo: Admit patient to Observation with expected length of stay less than 2 midnights.  Signed: Dionne Ano, MD 01/16/2019, 1:36 PM  Pager: [phone]

## 2019-02-21 ENCOUNTER — Inpatient Hospital Stay (HOSPITAL_COMMUNITY)
Admission: EM | Admit: 2019-02-21 | Discharge: 2019-02-22 | DRG: 638 | Disposition: A | Discharge status: Home / Self Care | Payer: Self-pay | Attending: Internal Medicine | Admitting: Internal Medicine

## 2019-02-21 ENCOUNTER — Other Ambulatory Visit: Payer: Self-pay

## 2019-02-21 ENCOUNTER — Encounter (HOSPITAL_COMMUNITY): Payer: Self-pay

## 2019-02-21 DIAGNOSIS — I10 Essential (primary) hypertension: Secondary | ICD-10-CM | POA: Diagnosis present

## 2019-02-21 DIAGNOSIS — Z833 Family history of diabetes mellitus: Secondary | ICD-10-CM

## 2019-02-21 DIAGNOSIS — N179 Acute kidney failure, unspecified: Secondary | ICD-10-CM | POA: Diagnosis present

## 2019-02-21 DIAGNOSIS — Z885 Allergy status to narcotic agent status: Secondary | ICD-10-CM

## 2019-02-21 DIAGNOSIS — R7989 Other specified abnormal findings of blood chemistry: Secondary | ICD-10-CM

## 2019-02-21 DIAGNOSIS — Z9114 Patient's other noncompliance with medication regimen: Secondary | ICD-10-CM

## 2019-02-21 DIAGNOSIS — E739 Lactose intolerance, unspecified: Secondary | ICD-10-CM | POA: Diagnosis present

## 2019-02-21 DIAGNOSIS — F17210 Nicotine dependence, cigarettes, uncomplicated: Secondary | ICD-10-CM | POA: Diagnosis present

## 2019-02-21 DIAGNOSIS — R718 Other abnormality of red blood cells: Secondary | ICD-10-CM

## 2019-02-21 DIAGNOSIS — D72829 Elevated white blood cell count, unspecified: Secondary | ICD-10-CM | POA: Diagnosis present

## 2019-02-21 DIAGNOSIS — F1290 Cannabis use, unspecified, uncomplicated: Secondary | ICD-10-CM | POA: Diagnosis present

## 2019-02-21 DIAGNOSIS — Z91120 Patient's intentional underdosing of medication regimen due to financial hardship: Secondary | ICD-10-CM

## 2019-02-21 DIAGNOSIS — E1110 Type 2 diabetes mellitus with ketoacidosis without coma: Secondary | ICD-10-CM | POA: Diagnosis present

## 2019-02-21 DIAGNOSIS — Z9119 Patient's noncompliance with other medical treatment and regimen: Secondary | ICD-10-CM

## 2019-02-21 DIAGNOSIS — E1010 Type 1 diabetes mellitus with ketoacidosis without coma: Principal | ICD-10-CM | POA: Diagnosis present

## 2019-02-21 DIAGNOSIS — Z794 Long term (current) use of insulin: Secondary | ICD-10-CM

## 2019-02-21 DIAGNOSIS — Z91012 Allergy to eggs: Secondary | ICD-10-CM

## 2019-02-21 DIAGNOSIS — J45909 Unspecified asthma, uncomplicated: Secondary | ICD-10-CM | POA: Diagnosis present

## 2019-02-21 LAB — GLUCOSE, CAPILLARY
Glucose-Capillary: 461 mg/dL — ABNORMAL HIGH (ref 70–99)
Glucose-Capillary: 341 mg/dL — ABNORMAL HIGH (ref 70–99)
Glucose-Capillary: 291 mg/dL — ABNORMAL HIGH (ref 70–99)
Glucose-Capillary: 203 mg/dL — ABNORMAL HIGH (ref 70–99)
Glucose-Capillary: 124 mg/dL — ABNORMAL HIGH (ref 70–99)
Glucose-Capillary: 149 mg/dL — ABNORMAL HIGH (ref 70–99)
Glucose-Capillary: 158 mg/dL — ABNORMAL HIGH (ref 70–99)
Glucose-Capillary: 219 mg/dL — ABNORMAL HIGH (ref 70–99)

## 2019-02-21 LAB — POCT I-STAT EG7
Acid-base deficit: 20 mmol/L — ABNORMAL HIGH (ref 0.0–2.0)
Bicarbonate: 7.1 mmol/L — ABNORMAL LOW (ref 20.0–28.0)
Calcium, Ion: 1.28 mmol/L (ref 1.15–1.40)
HCT: 54 % — ABNORMAL HIGH (ref 39.0–52.0)
Hemoglobin: 18.4 g/dL — ABNORMAL HIGH (ref 13.0–17.0)
O2 Saturation: 93 %
Patient temperature: 98.6
Potassium: 5.3 mmol/L — ABNORMAL HIGH (ref 3.5–5.1)
Sodium: 140 mmol/L (ref 135–145)
TCO2: 8 mmol/L — ABNORMAL LOW (ref 22–32)
pCO2, Ven: 20.4 mmHg — ABNORMAL LOW (ref 44.0–60.0)
pH, Ven: 7.148 — CL (ref 7.250–7.430)
pO2, Ven: 85 mmHg — ABNORMAL HIGH (ref 32.0–45.0)

## 2019-02-21 LAB — BASIC METABOLIC PANEL
Anion gap: 24 — ABNORMAL HIGH (ref 5–15)
Anion gap: 17 — ABNORMAL HIGH (ref 5–15)
Anion gap: 13 (ref 5–15)
BUN: 24 mg/dL — ABNORMAL HIGH (ref 6–20)
BUN: 22 mg/dL — ABNORMAL HIGH (ref 6–20)
BUN: 18 mg/dL (ref 6–20)
CO2: 10 mmol/L — ABNORMAL LOW (ref 22–32)
CO2: 10 mmol/L — ABNORMAL LOW (ref 22–32)
CO2: 18 mmol/L — ABNORMAL LOW (ref 22–32)
Calcium: 9.6 mg/dL (ref 8.9–10.3)
Calcium: 9.6 mg/dL (ref 8.9–10.3)
Calcium: 9.2 mg/dL (ref 8.9–10.3)
Chloride: 112 mmol/L — ABNORMAL HIGH (ref 98–111)
Chloride: 119 mmol/L — ABNORMAL HIGH (ref 98–111)
Chloride: 117 mmol/L — ABNORMAL HIGH (ref 98–111)
Creatinine, Ser: 1.66 mg/dL — ABNORMAL HIGH (ref 0.61–1.24)
Creatinine, Ser: 1.32 mg/dL — ABNORMAL HIGH (ref 0.61–1.24)
Creatinine, Ser: 1.11 mg/dL (ref 0.61–1.24)
GFR calc Af Amer: >60 mL/min (ref >60)
GFR calc Af Amer: >60 mL/min (ref >60)
GFR calc Af Amer: >60 mL/min (ref >60)
GFR calc non Af Amer: 56 mL/min — ABNORMAL LOW (ref >60)
GFR calc non Af Amer: >60 mL/min (ref >60)
GFR calc non Af Amer: >60 mL/min (ref >60)
Glucose, Bld: 591 mg/dL (ref 70–99)
Glucose, Bld: 307 mg/dL — ABNORMAL HIGH (ref 70–99)
Glucose, Bld: 171 mg/dL — ABNORMAL HIGH (ref 70–99)
Potassium: 5.1 mmol/L (ref 3.5–5.1)
Potassium: 4.4 mmol/L (ref 3.5–5.1)
Potassium: 4.4 mmol/L (ref 3.5–5.1)
Sodium: 146 mmol/L — ABNORMAL HIGH (ref 135–145)
Sodium: 146 mmol/L — ABNORMAL HIGH (ref 135–145)
Sodium: 148 mmol/L — ABNORMAL HIGH (ref 135–145)

## 2019-02-21 LAB — LIPASE, BLOOD: Lipase: 21 U/L (ref 11–51)

## 2019-02-21 LAB — CBC WITH DIFFERENTIAL/PLATELET
Abs Immature Granulocytes: 0.28 10*3/uL — ABNORMAL HIGH (ref 0.00–0.07)
Basophils Absolute: 0.1 10*3/uL (ref 0.0–0.1)
Basophils Relative: 0 %
Eosinophils Absolute: 0 10*3/uL (ref 0.0–0.5)
Eosinophils Relative: 0 %
HCT: 58.3 % — ABNORMAL HIGH (ref 39.0–52.0)
Hemoglobin: 19.2 g/dL — ABNORMAL HIGH (ref 13.0–17.0)
Immature Granulocytes: 2 %
Lymphocytes Relative: 12 %
Lymphs Abs: 2 10*3/uL (ref 0.7–4.0)
MCH: 30.6 pg (ref 26.0–34.0)
MCHC: 32.9 g/dL (ref 30.0–36.0)
MCV: 93 fL (ref 80.0–100.0)
Monocytes Absolute: 0.9 10*3/uL (ref 0.1–1.0)
Monocytes Relative: 5 %
Neutro Abs: 13 10*3/uL — ABNORMAL HIGH (ref 1.7–7.7)
Neutrophils Relative %: 81 %
Platelets: 367 10*3/uL (ref 150–400)
RBC: 6.27 MIL/uL — ABNORMAL HIGH (ref 4.22–5.81)
RDW: 12.8 % (ref 11.5–15.5)
WBC: 16.2 10*3/uL — ABNORMAL HIGH (ref 4.0–10.5)
nRBC: 0 % (ref 0.0–0.2)

## 2019-02-21 LAB — COMPREHENSIVE METABOLIC PANEL
ALT: 14 U/L (ref 0–44)
AST: 13 U/L — ABNORMAL LOW (ref 15–41)
Albumin: 4.3 g/dL (ref 3.5–5.0)
Alkaline Phosphatase: 128 U/L — ABNORMAL HIGH (ref 38–126)
BUN: 30 mg/dL — ABNORMAL HIGH (ref 6–20)
CO2: <7 mmol/L — ABNORMAL LOW (ref 22–32)
Calcium: 10.1 mg/dL (ref 8.9–10.3)
Chloride: 98 mmol/L (ref 98–111)
Creatinine, Ser: 2.03 mg/dL — ABNORMAL HIGH (ref 0.61–1.24)
GFR calc Af Amer: 51 mL/min — ABNORMAL LOW (ref >60)
GFR calc non Af Amer: 44 mL/min — ABNORMAL LOW (ref >60)
Glucose, Bld: 899 mg/dL (ref 70–99)
Potassium: 5.4 mmol/L — ABNORMAL HIGH (ref 3.5–5.1)
Sodium: 139 mmol/L (ref 135–145)
Total Bilirubin: 3.1 mg/dL — ABNORMAL HIGH (ref 0.3–1.2)
Total Protein: 8.1 g/dL (ref 6.5–8.1)

## 2019-02-21 LAB — CBG MONITORING, ED
Glucose-Capillary: >600 mg/dL (ref 70–99)
Glucose-Capillary: 573 mg/dL (ref 70–99)
Glucose-Capillary: 506 mg/dL (ref 70–99)

## 2019-02-21 LAB — CBC
HCT: 58 % — ABNORMAL HIGH (ref 39.0–52.0)
Hemoglobin: 19.9 g/dL — ABNORMAL HIGH (ref 13.0–17.0)
MCH: 31.9 pg (ref 26.0–34.0)
MCHC: 34.3 g/dL (ref 30.0–36.0)
MCV: 92.9 fL (ref 80.0–100.0)
Platelets: 340 10*3/uL (ref 150–400)
RBC: 6.24 MIL/uL — ABNORMAL HIGH (ref 4.22–5.81)
RDW: 12.5 % (ref 11.5–15.5)
WBC: 22.1 10*3/uL — ABNORMAL HIGH (ref 4.0–10.5)
nRBC: 0 % (ref 0.0–0.2)

## 2019-02-21 LAB — MRSA PCR SCREENING: MRSA by PCR: NEGATIVE

## 2019-02-21 MED ORDER — DEXTROSE-NACL 5-0.45 % IV SOLN
INTRAVENOUS | Status: AC
  Administered 2019-02-21: 21:00:00 via INTRAVENOUS

## 2019-02-21 MED ORDER — FAMOTIDINE IN NACL 20-0.9 MG/50ML-% IV SOLN
20.0000 mg | Freq: Once | INTRAVENOUS | Status: AC
  Administered 2019-02-21: 16:00:00 20 mg via INTRAVENOUS
  Filled 2019-02-21: qty 50

## 2019-02-21 MED ORDER — ENOXAPARIN SODIUM 40 MG/0.4ML ~~LOC~~ SOLN
40.0000 mg | SUBCUTANEOUS | Status: DC
  Administered 2019-02-21 – 2019-02-22 (×2): 40 mg via SUBCUTANEOUS
  Filled 2019-02-21 (×2): qty 0.4

## 2019-02-21 MED ORDER — LACTATED RINGERS IV BOLUS
2000.0000 mL | Freq: Once | INTRAVENOUS | Status: AC
  Administered 2019-02-21 (×2): 2000 mL via INTRAVENOUS

## 2019-02-21 MED ORDER — DEXTROSE-NACL 5-0.45 % IV SOLN: INTRAVENOUS | Status: DC

## 2019-02-21 MED ORDER — SODIUM CHLORIDE 0.9 % IV SOLN: INTRAVENOUS | Status: DC

## 2019-02-21 MED ORDER — INSULIN REGULAR(HUMAN) IN NACL 100-0.9 UT/100ML-% IV SOLN
INTRAVENOUS | Status: DC
  Administered 2019-02-21: 12:00:00 5.4 [IU]/h via INTRAVENOUS
  Filled 2019-02-21: qty 100

## 2019-02-21 MED ORDER — INSULIN REGULAR(HUMAN) IN NACL 100-0.9 UT/100ML-% IV SOLN
INTRAVENOUS | Status: AC
  Administered 2019-02-21: 13.3 [IU]/h via INTRAVENOUS
  Administered 2019-02-21: 2.6 [IU]/h via INTRAVENOUS
  Filled 2019-02-21: qty 100

## 2019-02-21 MED ORDER — SODIUM CHLORIDE 0.9 % IV BOLUS
1000.0000 mL | Freq: Once | INTRAVENOUS | Status: AC
  Administered 2019-02-21: 1000 mL via INTRAVENOUS

## 2019-02-21 MED ORDER — SODIUM CHLORIDE 0.9 % IV SOLN
INTRAVENOUS | Status: AC
  Administered 2019-02-21: 16:00:00 via INTRAVENOUS

## 2019-02-21 MED ORDER — LACTATED RINGERS IV BOLUS: 1000.0000 mL | Freq: Once | INTRAVENOUS | Status: DC

## 2019-02-21 NOTE — ED Notes (Signed)
Pt remains tachy in the 140's & Rubin Payor, MD was informed.

## 2019-02-21 NOTE — ED Triage Notes (Signed)
Last pm pt stated out of the blue his stomach began hurting & he started vomiting & continued to through the night. He stated that he last tried to sip on water around 0400 & has not held anything down. He took no meds at home for any symptom relief other than tylenol, CBG-316 (per EMS). 4mg  Zofran & 500cc bolus given in route to ED.

## 2019-02-21 NOTE — Progress Notes (Signed)
Received report from [place], [employer] fundraiser. Ready for pt.

## 2019-02-21 NOTE — ED Notes (Signed)
EDP at bedside  

## 2019-02-21 NOTE — ED Provider Notes (Addendum)
[facility] Provider [facility]   CSN: [id number] Arrival date & time: 02/21/19  1005    History   Chief Complaint Chief Complaint  Patient presents with  . Emesis    HPI Bridget HartshornGregory Yurkovich is a 28 y.o. male.     HPI Patient presents feeling bad since last night.  States vomiting and abdominal pain.  Has not been able to keep things down.  States his sugars have been running high.  316 cbg for EMS.  History of DKA and states he could have it again.  States he did not take his insulin yesterday because he was feeling bad.  Does have a history of noncompliance.  States his doctor is downstairs with the internal medicine clinic.  Denies infection.  States his sugar has been running around 200 over the last week which she states is actually low for him.  No blood in the emesis.  No diarrhea.  No cough. Past Medical History:  Diagnosis Date  . Asthma   . DKA (diabetic ketoacidoses) (HCC) 06/2017; 10/17/2017  . DKA (diabetic ketoacidoses) (HCC) 02/2018  . DKA (diabetic ketoacidoses) (HCC) 12/2018  . Hyperglycemia 09/24/2017  . Obesity   . Type II diabetes mellitus (HCC)    "dx'd 06/2017"    Patient Active Problem List   Diagnosis Date Noted  . Hypokalemia 03/18/2018  . Normocytic anemia 03/18/2018  . Hyperglycemia   . CAP (community acquired pneumonia) 03/17/2018  . DKA (diabetic ketoacidoses) (HCC) 03/06/2018  . Right bundle branch block (RBBB) determined by electrocardiography 08/24/2017  . Type 2 diabetes mellitus (HCC) 08/01/2017  . Smoking 08/01/2017  . HTN (hypertension) 07/25/2017    Past Surgical History:  Procedure Laterality Date  . LACERATION REPAIR Left    "stitched finger up"  . TONSILLECTOMY          Home Medications    Prior to Admission medications   Medication Sig Start Date End Date Taking? Authorizing Provider  Insulin Isophane & Regular Human (NOVOLIN 70/30 FLEXPEN RELION) (70-30) 100 UNIT/ML PEN Inject 16 Units  into the skin 2 (two) times daily. Patient taking differently: Inject 40 Units into the skin daily.  08/28/18  Yes Dimple NanasAmin, Ankit Chirag, MD    Family History History reviewed. No pertinent family history.  Social History Social History   Tobacco Use  . Smoking status: Current Every Day Smoker    Packs/day: 0.10    Years: 12.00    Pack years: 1.20    Types: Cigarettes  . Smokeless tobacco: Never Used  . Tobacco comment: cutting back - now 1-2 cigarettes per day  Substance Use Topics  . Alcohol use: Yes    Comment: OCCASSIONAL  . Drug use: Yes    Types: Marijuana    Comment: 10/17/2017 ~3 times per week     Allergies   Vicodin [hydrocodone-acetaminophen]; Eggs or egg-derived products; and Lactose intolerance (gi)   Review of Systems Review of Systems  Constitutional: Positive for fatigue. Negative for fever.  HENT: Negative for congestion.   Respiratory: Negative for shortness of breath.   Gastrointestinal: Positive for abdominal pain, nausea and vomiting.  Genitourinary: Negative for flank pain.  Musculoskeletal: Negative for back pain.  Skin: Negative for rash.  Neurological: Positive for weakness.  Psychiatric/Behavioral: Negative for confusion.     Physical Exam Updated Vital Signs BP 120/88   Pulse (!) 137   Temp 99.2 F (37.3 C) (Oral) Comment: Simultaneous filing. User may not have seen previous data. Comment (Src):  Simultaneous filing. User may not have seen previous data.  Resp 17   Ht 5\' 6"  (1.676 m)   Wt 77.1 kg   SpO2 96%   BMI 27.44 kg/m   Physical Exam Nursing note reviewed.  HENT:     Head: Normocephalic.     Mouth/Throat:     Comments: Patient is wearing a mask. Neck:     Musculoskeletal: Neck supple.  Cardiovascular:     Comments: Tachycardia Chest:     Chest wall: No tenderness.  Musculoskeletal:     Right lower leg: No edema.     Left lower leg: No edema.  Skin:    General: Skin is warm.     Capillary Refill: Capillary refill  takes less than 2 seconds.  Neurological:     Mental Status: Mental status is at baseline.      ED Treatments / Results  Labs (all labs ordered are listed, but only abnormal results are displayed) Labs Reviewed  COMPREHENSIVE METABOLIC PANEL - Abnormal; Notable for the following components:      Result Value   Potassium 5.4 (*)    CO2 <7 (*)    Glucose, Bld 899 (*)    BUN 30 (*)    Creatinine, Ser 2.03 (*)    AST 13 (*)    Alkaline Phosphatase 128 (*)    Total Bilirubin 3.1 (*)    GFR calc non Af Amer 44 (*)    GFR calc Af Amer 51 (*)    All other components within normal limits  CBC WITH DIFFERENTIAL/PLATELET - Abnormal; Notable for the following components:   WBC 16.2 (*)    RBC 6.27 (*)    Hemoglobin 19.2 (*)    HCT 58.3 (*)    Neutro Abs 13.0 (*)    Abs Immature Granulocytes 0.28 (*)    All other components within normal limits  LIPASE, BLOOD  BLOOD GAS, VENOUS  URINALYSIS, ROUTINE W REFLEX MICROSCOPIC    EKG EKG Interpretation  Date/Time:  Friday February 21 2019 10:11:43 EDT Ventricular Rate:  149 PR Interval:    QRS Duration: 124 QT Interval:  323 QTC Calculation: 509 R Axis:   -120 Text Interpretation:  Sinus tachycardia Consider right atrial enlargement Nonspecific IVCD with LAD t waves peaked Confirmed by Benjiman Core [phone]) on 02/21/2019 10:21:21 AM   Radiology No results found.  Procedures Procedures (including critical care time)  Medications Ordered in ED Medications  dextrose 5 %-0.45 % sodium chloride infusion (has no administration in time range)  insulin regular, human (MYXREDLIN) 100 units/ 100 mL infusion (has no administration in time range)  sodium chloride 0.9 % bolus 1,000 mL (has no administration in time range)  sodium chloride 0.9 % bolus 1,000 mL (1,000 mLs Intravenous Bolus from Bag 02/21/19 1030)     Initial Impression / Assessment and Plan / ED Course  I have reviewed the triage vital signs and the nursing notes.   Pertinent labs & imaging results that were available during my care of the patient were reviewed by me and considered in my medical decision making (see chart for details).        Patient presented with nausea and vomiting.  Tachycardia.  History of DKA and appears to be in the same today.  EMS CBG was 316, however it is 899 here.  Bicarb on CMP less than 7.  pH of 7.12 on VBG, although the rest of it did not transfer over.  Will start insulin drip.  Will admit to internal medicine.  Patient states his PCP is the internal medicine clinic, however I do not see days been seen there for several months and is actually been trying to find another PCP.  However the internal medicine residents are also up for unassigned right now.  CRITICAL CARE Performed by: Benjiman Core Total critical care time: 30 minutes Critical care time was exclusive of separately billable procedures and treating other patients. Critical care was necessary to treat or prevent imminent or life-threatening deterioration. Critical care was time spent personally by me on the following activities: development of treatment plan with patient and/or surrogate as well as nursing, discussions with consultants, evaluation of patient's response to treatment, examination of patient, obtaining history from patient or surrogate, ordering and performing treatments and interventions, ordering and review of laboratory studies, ordering and review of radiographic studies, pulse oximetry and re-evaluation of patient's condition.   Final Clinical Impressions(s) / ED Diagnoses   Final diagnoses:  Diabetic ketoacidosis without coma associated with type 1 diabetes mellitus [facility])    ED Discharge Orders    None       Benjiman Core, MD 02/21/19 1138    Benjiman Core, MD 02/21/19 1147

## 2019-02-21 NOTE — Discharge Summary (Signed)
Name: Roger HartshornGregory Farley MRN: [id number] DOB: June 17, 1991 28 y.o. PCP: Patient, No Pcp Per  Date of Admission: 02/21/2019 10:05 AM Date of Discharge: 02/22/2019 Attending Physician: Dr. Rogelia BogaButcher  Discharge Diagnosis: 1.  Diabetic ketoacidosis 2.  Insulin-dependent type 2 diabetes mellitus 3.  Hypertension 4.  Acute kidney injury/prerenal azotemia  Discharge Medications: Allergies as of 02/22/2019      Reactions   Vicodin [hydrocodone-acetaminophen] Anaphylaxis   Tolerates tylenol with no allergy   Eggs Or Egg-derived Products Nausea And Vomiting   Lactose Intolerance (gi) Rash      Medication List    TAKE these medications   Insulin Isophane & Regular Human (70-30) 100 UNIT/ML PEN Commonly known as:  NovoLIN 70/30 FlexPen Relion Inject 16 Units into the skin 2 (two) times daily. What changed:    how much to take  when to take this       Disposition and follow-up:   [name] was [facility] [place]  At the hospital follow up visit please address:  1.  Diabetic ketoacidosis;  Insulin-dependent type 2 diabetes mellitus: Please ensure compliance with Insulin. He requires diabetic teaching and possible medication adjustment      Acute kidney injury/prerenal azotemia: Please repeat BMP  2.  Labs / imaging needed at time of follow-up: BMP  3.  Pending labs/ test needing follow-up: None  Follow-up Appointments:   Hospital Course by problem list: 1.  Diabetic ketoacidosis; insulin-dependent type 2 diabetes mellitus:  [name] is a 28 year old African-American gentleman with insulin-dependent type 2 diabetes mellitus, asthma and hypertension who presented to [facility] emergency department on February 21, 2019 with a 1 day history of epigastric pain, nausea and nonbilious nonbloody emesis.  On arrival to the emergency department he was tachycardic to the 140s, tachypneic to 24 however his blood pressure and oxygen  saturation were unremarkable.  His labs revealed markedly elevated anion gap metabolic acidosis, hyperglycemia with glucose in the 800s and CBG greater than 600.  ABG was consistent with metabolic acidosis with values seven-point 11/23/1983.  He was subsequently admitted to our stepdown unit and treated with IV fluids and IV insulin.  He improved clinically and objectively and was subsequently discharged to resume his home insulin regimen.  2.  Acute kidney injury: his serum creatinine was elevated to 2 on admission. However, this improved to 0.7 at discharge  3. Hypertension:  His blood pressure was stable  Discharge Vitals:   BP 118/78 (BP Location: Right Arm)   Pulse 88   Temp 98.6 F (37 C) (Axillary)   Resp 16   Ht 5\' 6"  (1.676 m)   Wt 77.1 kg   SpO2 100%   BMI 27.44 kg/m   Pertinent Labs, Studies, and Procedures:  BMP Latest Ref Rng & Units 02/22/2019 02/22/2019 02/21/2019  Glucose 70 - 99 mg/dL 409(W200(H) 119(J198(H) 478(G171(H)  BUN 6 - 20 mg/dL 12 14 18   Creatinine 0.61 - 1.24 mg/dL 9.560.78 2.130.81 0.861.11  Sodium 135 - 145 mmol/L 139 140 148(H)  Potassium 3.5 - 5.1 mmol/L 3.5 3.6 4.4  Chloride 98 - 111 mmol/L 110 113(H) 117(H)  CO2 22 - 32 mmol/L 19(L) 17(L) 18(L)  Calcium 8.9 - 10.3 mg/dL 5.7(Q8.8(L) 4.6(N8.6(L) 9.2    Discharge Instructions: Discharge Instructions    Call MD for:  persistant dizziness or light-headedness   Complete by:  As directed    Call MD for:  persistant nausea and vomiting   Complete by:  As directed  Call MD for:  severe uncontrolled pain   Complete by:  As directed    Diet - low sodium heart healthy   Complete by:  As directed    Discharge instructions   Complete by:  As directed    [name],   It was a pleasure taking care of you here at the hospital. You were admitted because of diabetic ketoacidosis. We treated you with insulin and fluids.   As we discussed, I believe you will very much benefit from following up with a primary care doctor. We consulted the  case manager at the hospital who will work on setting you up with an appointment at [facility].   I have sent your insulin prescription to [facility].   I hope you feel better and get well.   Take Care! Dr. Dortha Schwalbe.   Increase activity slowly   Complete by:  As directed       Signed: Yvette Rack, MD 02/23/2019, 6:07 AM   Pager: [phone] IMTS PGY-1

## 2019-02-21 NOTE — Progress Notes (Signed)
FS 203, per glucostabilizer, insulin gtt decreased to 7.2units/hour.

## 2019-02-21 NOTE — ED Notes (Signed)
Ice chips given

## 2019-02-21 NOTE — ED Notes (Signed)
Attempted to call report

## 2019-02-21 NOTE — H&P (Addendum)
Date: 02/21/2019               Patient Name:  [name] MRN: [id number]  DOB: December 11, 1990 Age / Sex: 28 y.o., male   PCP: Patient, No Pcp Per         Medical Service: Internal Medicine Teaching Service         Attending Physician: Dr. Rogelia Boga    First Contact: Dr. Dortha Schwalbe Pager: [phone]  Second Contact: Dr. Alinda Money Pager: [phone]       After Hours (After 5p/  First Contact Pager: [phone]  weekends / holidays): Second Contact Pager: [phone]   Chief Complaint: Abdominal pain, emesis  History of Present Illness: [name] is a 28 year old African-American gentleman with uncontrolled insulin-dependent type 2 diabetes mellitus, recurrent episodes of diabetic ketoacidosis, asthma and hypertension who presented to [facility] emergency department with epigastric pain, nausea and emesis.  He was in his usual state of health until around 2 AM the day of admission when he began experiencing epigastric pain/burning sensation (10/10) with associated nausea, nonbilious nonbloody emesis which he states occurred multiple times.  He reports that his epigastric pain is similar to the symptoms he experiences when he is in DKA.  He tells me he was admitted to the hospital couple months ago with similar complaints.  He also endorses polyuria, polydipsia, polyphagia but denies dizziness, lightheadedness.Marland Kitchen  He is supposed to take Novolin 70/30 16 units twice daily however he reports that he usually injects 40 units twice a day.  There is questionable noncompliance to insulin regimen given his frequent admissions for DKA.  He states that he does monitor his blood glucose at home and recently has ranged from 200s-300s.  Per chart review, he has had multiple admissions with diabetic ketoacidosis and usually leaves against medical advice.  His last admission was January 16, 2019 when he presented with chest pain, polyuria, polydipsia polyphagia.  He was found to be in DKA however left AMA.   ED course:  Afebrile, tachycardic to 140s, tachypneic to 24, BP 120s/80s, SPO2 96% on room air.  CMP shows markedly elevated anion gap metabolic acidosis with bicarb less than 7, glucose 899, potassium 5.4, total bilirubin 3.1, CBG greater than 600.  Lipase unremarkable, CBC shows leukocytosis of 16.2 with neutrophil predominance, hemoglobin 19.2, hematocrit 58, i-STAT ABG pH 7.1, PCO2 20, PO2 85, bicarb 7.1. Was stated on IVF with normal saline and Insulin.   Meds:  Current Meds  Medication Sig  . Insulin Isophane & Regular Human (NOVOLIN 70/30 FLEXPEN RELION) (70-30) 100 UNIT/ML PEN Inject 16 Units into the skin 2 (two) times daily. (Patient taking differently: Inject 40 Units into the skin daily. )     Allergies: Allergies as of 02/21/2019 - Review Complete 02/21/2019  Allergen Reaction Noted  . Vicodin [hydrocodone-acetaminophen] Anaphylaxis 01/22/2014  . Eggs or egg-derived products Nausea And Vomiting 07/25/2017  . Lactose intolerance (gi) Rash 01/22/2014   Past Medical History:  Diagnosis Date  . Asthma   . DKA (diabetic ketoacidoses) (HCC) 06/2017; 10/17/2017  . DKA (diabetic ketoacidoses) (HCC) 02/2018  . DKA (diabetic ketoacidoses) (HCC) 12/2018  . Hyperglycemia 09/24/2017  . Obesity   . Type II diabetes mellitus (HCC)    "dx'd 06/2017"    Family History:  -Heart issue in mother -Mother, grandfather with diabetes mellitus  Social History: Smokes 1 pack of cigarettes per day for more than 10 years, occasional alcohol use, endorses marijuana use, lives at home with his mother.  Review  of Systems: A complete ROS was negative except as per HPI.   Review of Systems  Constitutional: Negative for chills, diaphoresis and weight loss.  Eyes: Negative for blurred vision and double vision.  Respiratory: Negative.   Cardiovascular: Negative.   Gastrointestinal: Positive for abdominal pain, nausea and vomiting.  Genitourinary: Positive for frequency and urgency.  Musculoskeletal:  Negative.   Neurological: Negative.  Negative for dizziness and headaches.    Physical Exam: Blood pressure (!) 139/102, pulse (!) 143, temperature 99.2 F (37.3 C), temperature source Oral, resp. rate 18, height 5\' 6"  (1.676 m), weight 77.1 kg, SpO2 97 %.  Physical Exam Vitals signs and nursing note reviewed.  Constitutional:      General: He is not in acute distress.    Appearance: He is not ill-appearing, toxic-appearing or diaphoretic.  HENT:     Head: Normocephalic and atraumatic.     Mouth/Throat:     Mouth: Mucous membranes are dry.  Eyes:     Conjunctiva/sclera: Conjunctivae normal.  Neck:     Musculoskeletal: Normal range of motion and neck supple.  Cardiovascular:     Rate and Rhythm: Tachycardia present.     Heart sounds: Normal heart sounds. No murmur. No friction rub. No gallop.   Pulmonary:     Effort: Pulmonary effort is normal.     Breath sounds: Normal breath sounds. No wheezing, rhonchi or rales.  Abdominal:     General: Abdomen is flat. Bowel sounds are normal. There is no distension.     Tenderness: There is no abdominal tenderness.  Musculoskeletal:        General: No swelling, tenderness or deformity.     Right lower leg: No edema.     Left lower leg: No edema.  Skin:    General: Skin is dry.     Findings: No lesion or rash.     Comments: -- Skin appears dry -- Multiple tattoos   Neurological:     Mental Status: He is alert.  Psychiatric:        Mood and Affect: Mood normal.     EKG: personally reviewed my interpretation is sinus tachycardia, peaked T waves in lead I,II, V2-V6. Somewhat similar to previous EKG on 01/16/2019  Assessment & Plan by Problem: Active Problems:   DKA (diabetic ketoacidoses) [facility])  [name] is a 28 y/o AA gentleman with Insulin dependent T2DM, Asthma and HTN here for management of DKA.   #Diabetic ketoacidosis: Presents with 1 day history of epigastric pain, nausea and NBNB emesis.  On arrival to ED, found to  have elevated anion gap metabolic acidosis, hyperglycemia glucose in the 800s, and CBG> 600. ABG consistent with metabolic acidosis 7.11/19/83.  He was afebrile, tachycardic to 140s, tachypneic to 24, BP unremarkable, oxygen saturation 96% room air.  Urinalysis pending.  -- Admit to stepdown unit -- Start on insulin drip -- Normal saline at 150 mL/hr until CBG less than 250 -- Switch to D5-1/2 NS when 1 CBG less than 250 -- Nothing by mouth  -- BMET every 4 hours -- CBG Q1H -- Once anion gap closed 2, start CM diet and if able to eat, administer Lantus  -- Continue insulin drip for 1-2 more hours, then discontinue and start SSI -- DC fluids if eating, drinking, and off insulin drip  #Insulin-dependent type 2 diabetes mellitus: States he has not been able to administer insulin since yesterday when he started feeling unwell - Treat DKA as above  #Hemoconcentration: CBC shows leukocytosis  of 16.2, hemoglobin 19.2, hematocrit 58.  Given recent history of emesis and unable to tolerate p.o. intake, this well represent hemoconcentration -Continue to monitor.  #Hypertension: BP stable in the 120s/80s -Continue to monitor  #Acute Kidney Injury #Pre-renal azotemia -sCr of 2 on admission (Baseline ~0.8) -Continue to monitor   FEN: N.p.o., replace electrolytes as needed VTE ppx: Cutaneous Lovenox CODE STATUS: Full code  Dispo: Admit patient to Inpatient with expected length of stay greater than 2 midnights.  Signed: Yvette RackAgyei, Obed K, MD 02/21/2019, 1:43 PM  Pager: [phone] IMTS PGY-1

## 2019-02-21 NOTE — Progress Notes (Signed)
CRITICAL VALUE ALERT  Critical Value:  Glucose 591  Date & Time Notied:  02/21/2019 / 1525  Provider Notified: Dr. Dortha Schwalbe paged.

## 2019-02-22 LAB — BASIC METABOLIC PANEL
Anion gap: 10 (ref 5–15)
Anion gap: 10 (ref 5–15)
BUN: 14 mg/dL (ref 6–20)
BUN: 12 mg/dL (ref 6–20)
CO2: 17 mmol/L — ABNORMAL LOW (ref 22–32)
CO2: 19 mmol/L — ABNORMAL LOW (ref 22–32)
Calcium: 8.6 mg/dL — ABNORMAL LOW (ref 8.9–10.3)
Calcium: 8.8 mg/dL — ABNORMAL LOW (ref 8.9–10.3)
Chloride: 113 mmol/L — ABNORMAL HIGH (ref 98–111)
Chloride: 110 mmol/L (ref 98–111)
Creatinine, Ser: 0.81 mg/dL (ref 0.61–1.24)
Creatinine, Ser: 0.78 mg/dL (ref 0.61–1.24)
GFR calc Af Amer: >60 mL/min (ref >60)
GFR calc Af Amer: >60 mL/min (ref >60)
GFR calc non Af Amer: >60 mL/min (ref >60)
GFR calc non Af Amer: >60 mL/min (ref >60)
Glucose, Bld: 198 mg/dL — ABNORMAL HIGH (ref 70–99)
Glucose, Bld: 200 mg/dL — ABNORMAL HIGH (ref 70–99)
Potassium: 3.6 mmol/L (ref 3.5–5.1)
Potassium: 3.5 mmol/L (ref 3.5–5.1)
Sodium: 140 mmol/L (ref 135–145)
Sodium: 139 mmol/L (ref 135–145)

## 2019-02-22 LAB — GLUCOSE, CAPILLARY
Glucose-Capillary: 128 mg/dL — ABNORMAL HIGH (ref 70–99)
Glucose-Capillary: 129 mg/dL — ABNORMAL HIGH (ref 70–99)
Glucose-Capillary: 134 mg/dL — ABNORMAL HIGH (ref 70–99)
Glucose-Capillary: 165 mg/dL — ABNORMAL HIGH (ref 70–99)
Glucose-Capillary: 181 mg/dL — ABNORMAL HIGH (ref 70–99)
Glucose-Capillary: 195 mg/dL — ABNORMAL HIGH (ref 70–99)
Glucose-Capillary: 200 mg/dL — ABNORMAL HIGH (ref 70–99)
Glucose-Capillary: 213 mg/dL — ABNORMAL HIGH (ref 70–99)
Glucose-Capillary: 318 mg/dL — ABNORMAL HIGH (ref 70–99)
Glucose-Capillary: 550 mg/dL (ref 70–99)
Glucose-Capillary: 571 mg/dL (ref 70–99)

## 2019-02-22 LAB — CBC
HCT: 42.8 % (ref 39.0–52.0)
Hemoglobin: 15 g/dL (ref 13.0–17.0)
MCH: 31.1 pg (ref 26.0–34.0)
MCHC: 35 g/dL (ref 30.0–36.0)
MCV: 88.6 fL (ref 80.0–100.0)
Platelets: 213 10*3/uL (ref 150–400)
RBC: 4.83 MIL/uL (ref 4.22–5.81)
RDW: 12.3 % (ref 11.5–15.5)
WBC: 11.9 10*3/uL — ABNORMAL HIGH (ref 4.0–10.5)
nRBC: 0 % (ref 0.0–0.2)

## 2019-02-22 MED ORDER — INSULIN ASPART 100 UNIT/ML ~~LOC~~ SOLN
0.0000 [IU] | SUBCUTANEOUS | Status: DC
  Administered 2019-02-22: 2 [IU] via SUBCUTANEOUS
  Administered 2019-02-22: 20 [IU] via SUBCUTANEOUS
  Administered 2019-02-22: 09:00:00 5 [IU] via SUBCUTANEOUS

## 2019-02-22 MED ORDER — INSULIN ISOPHANE & REGULAR (HUMAN 70-30)100 UNIT/ML KWIKPEN: 16.0000 [IU] | PEN_INJECTOR | Freq: Two times a day (BID) | SUBCUTANEOUS | 11 refills | Status: DC

## 2019-02-22 MED ORDER — INSULIN ASPART 100 UNIT/ML ~~LOC~~ SOLN
20.0000 [IU] | Freq: Once | SUBCUTANEOUS | Status: AC
  Administered 2019-02-22: 20 [IU] via SUBCUTANEOUS

## 2019-02-22 MED ORDER — INSULIN GLARGINE 100 UNIT/ML ~~LOC~~ SOLN
20.0000 [IU] | Freq: Once | SUBCUTANEOUS | Status: AC
  Administered 2019-02-22: 03:00:00 20 [IU] via SUBCUTANEOUS
  Filled 2019-02-22 (×2): qty 0.2

## 2019-02-22 MED ORDER — PHENOL 1.4 % MT LIQD
1.0000 | OROMUCOSAL | Status: DC | PRN
  Administered 2019-02-22: 1 via OROMUCOSAL
  Filled 2019-02-22: qty 177

## 2019-02-22 MED ORDER — INSULIN GLARGINE 100 UNIT/ML ~~LOC~~ SOLN: 20.0000 [IU] | Freq: Once | SUBCUTANEOUS | Status: DC

## 2019-02-22 MED ORDER — INSULIN ASPART 100 UNIT/ML ~~LOC~~ SOLN: 0.0000 [IU] | Freq: Three times a day (TID) | SUBCUTANEOUS | Status: DC

## 2019-02-22 MED ORDER — INSULIN ASPART 100 UNIT/ML ~~LOC~~ SOLN
6.0000 [IU] | Freq: Three times a day (TID) | SUBCUTANEOUS | Status: DC
  Administered 2019-02-22: 18:00:00 6 [IU] via SUBCUTANEOUS

## 2019-02-22 MED ORDER — INSULIN ASPART 100 UNIT/ML ~~LOC~~ SOLN: 0.0000 [IU] | SUBCUTANEOUS | Status: DC

## 2019-02-22 MED ORDER — INSULIN ASPART 100 UNIT/ML ~~LOC~~ SOLN
0.0000 [IU] | Freq: Three times a day (TID) | SUBCUTANEOUS | Status: DC
  Administered 2019-02-22: 4 [IU] via SUBCUTANEOUS

## 2019-02-22 MED ORDER — LACTATED RINGERS IV BOLUS
1000.0000 mL | Freq: Once | INTRAVENOUS | Status: AC
  Administered 2019-02-22: 1000 mL via INTRAVENOUS

## 2019-02-22 NOTE — Progress Notes (Signed)
Pt lunchtime CBG was 550. DR paged. Verbal order received to administer 20 units of novolog and recheck CBG in one hour. LR order bolus started per order.

## 2019-02-22 NOTE — Progress Notes (Signed)
  Date: 02/22/2019  Patient name: Dann Buenafe  Medical record number: [id number]  Date of birth: 14-Sep-1991   I have seen and evaluated [name] and discussed their care with the Residency Team. Mr Filomena Jungling is a 28 year old man with poorly controlled insulin-dependent type 2 diabetes.  He was first diagnosed during a September 2018 admission for DKA/HONK.  We established him with our clinic but he unfortunately was recently dismissed due to frequent no-shows.  This is his ninth admission for the same since that diagnosis in 2018.  He has lost significant weight since that first diagnosis  - 150kg  (07/2017) - 115 (12/2017) - 77 kg (admit) He is again being admitted for DKA/HONK and has responded well to standard treatment.  Currently, he is awaiting breakfast.  His CO2 is elevated to 19.  His gap has decreased to 10.  He is off all IV drips and meds and is receiving subcu insulin.  No infectious trigger has been found for his hypoglycemic episode.  He states his barrier to adequate diabetes control is the cost of insulin as he has no insurance.  He understands the consequences of uncontrolled diabetes.  He states he is drinking water and no longer drinking sodas.  PMHx, Fam Hx, and/or Soc Hx : He has many family members with diabetes.  He has not established with a new PCP.  Vitals:   02/22/19 0607 02/22/19 0812  BP: 115/79   Pulse: 89   Resp: 19   Temp:  97.9 F (36.6 C)  SpO2: 100%   General no acute distress. Psych poor eye contact, flat affect, paucity of speech Heart regular rate and rhythm no murmur rubs or gallops Lungs clear to auscultation bilaterally with good airflow Skin warm and dry, positive tattoos  I personally viewed his EKG which showed a sinus tach at 149, indeterminate axis, no ischemic changes.  Assessment and Plan: I have seen and evaluated the patient as outlined above. I agree with the formulated Assessment and Plan as detailed in the residents' note, with  the following changes:  Mr Filomena Jungling is a 28 year old man with poorly controlled insulin-dependent type 2 diabetes.  This is his 10th admission since his diabetes diagnosis in September 2018.  He has never been able to achieve glycemic control which he states is due to inability to afford insulin.  We will contact care management for medication assistance at discharge and get him set up at [facility] health and wellness center.  In the outpatient setting, he will need intensive motivation counseling, education, and support to achieve diabetes control.  He is stable to be discharged today.   Burns Spain, MD 4/25/202011:02 AM

## 2019-02-22 NOTE — Progress Notes (Signed)
Repeat blood sugar 571. IM resident paged. Awaiting response.   Lyndal Pulley, RN 02/22/2019 2:20 PM

## 2019-02-22 NOTE — Care Management (Signed)
Requested CMA to schedule f/u at [facility] Monday and contact patient about appointment. Spoke to MD who will likely send scripts to [facility] for lowest cost.

## 2019-02-22 NOTE — Progress Notes (Signed)
   Subjective: HD#1   Overnight: No acute events reported   Today he feels better and is awaiting breakfast delivery. He denies h/a, n/v/d, abdominal pain. Discussed insulin regiment. He states he doesn't take insulin as prescribed because he doesn't have insurance. He is aware of the negative side effects that come from uncontrolled diabetes and is agreeable to getting plugged in with community health and wellness for primary care and financial assistance   Objective:  Vital signs in last 24 hours: Vitals:   02/22/19 0006 02/22/19 0107 02/22/19 0207 02/22/19 0415  BP: 132/85 116/80 110/65   Pulse: 99 (!) 101 93   Resp: 13 14 17    Temp:   98 F (36.7 C) 98.4 F (36.9 C)  TempSrc:   Oral Oral  SpO2: 100% 100% 100%   Weight:      Height:        Const: In NAD, lying in bed comfortably, Mood and affect somewhat flat HEENT: [place]/AT Resp: CTABL, no wheezes, crackles, rhonchi  CV: RRR, no MGR Abd: BS+, NTTP Ext: No lower extremity edema   Assessment/Plan:  Active Problems:   DKA (diabetic ketoacidoses) (HCC)  [name] is a 28 y/o AA gentleman with Insulin dependent T2DM, Asthma and HTN here for management of DKA.   #Diabetic ketoacidosis: Symptoms have resolved as he denies nausea, vomiting, headache, dizziness, abdominal pain. AG has closed and he has been transitioned to subcutaneous Lantus. Metabolic acidosis is improving. Bicarb this am was 19. Moving forward, he will require close follow up with his PCP for an extensive diabetic teaching and medication management. We have asked case management for assistance with getting plugged in with [facility] and Wellness in addition to medication assistance.  -Continue carb modified diet  -Lantus 20U qhs -Novolog 6U TID w/ meals -SSI  -If stable, will discharge today and follow up with [facility] and Wellness  -Follow up BMP  #Insulin-dependent type 2 diabetes mellitus: CBG this am 213 -Home regimen Novolin 70/30  16U BID  #Hemoconcentration: Resolved -Continue to monitor.  #Hypertension: BP stable  -Continue to monitor  #Acute Kidney Injury #Pre-renal azotemia -Resolved -sCr of 0.8 (Baseline ~0.8) -Continue to monitor   FEN: CM diet., replace electrolytes as needed VTE ppx: SubCutaneous Lovenox CODE STATUS: Full code  Dispo: Anticipate discharge today.   Yvette Rack, MD 02/22/2019, 6:06 AM Pager: [phone] IMTS PGY-1

## 2019-02-22 NOTE — Progress Notes (Signed)
Patient AVS documentation reviewed and received. PIV's removed and patient wheeled downstairs for discharge.

## 2019-02-24 NOTE — Care Management (Signed)
Hospital followup appt made at [facility] and Wellness 03/04/2019 @ 3:30pm. Attempted to contact patient at phone # on file, but no answer.

## 2019-03-04 ENCOUNTER — Inpatient Hospital Stay: Payer: Self-pay | Admitting: Critical Care Medicine

## 2019-03-29 ENCOUNTER — Encounter (HOSPITAL_COMMUNITY): Payer: Self-pay | Admitting: Emergency Medicine

## 2019-03-29 ENCOUNTER — Other Ambulatory Visit: Payer: Self-pay

## 2019-03-29 ENCOUNTER — Emergency Department (HOSPITAL_COMMUNITY)
Admission: EM | Admit: 2019-03-29 | Discharge: 2019-03-29 | Disposition: A | Discharge status: Home / Self Care | Payer: Self-pay | Attending: Emergency Medicine | Admitting: Emergency Medicine

## 2019-03-29 DIAGNOSIS — Z5321 Procedure and treatment not carried out due to patient leaving prior to being seen by health care provider: Secondary | ICD-10-CM | POA: Insufficient documentation

## 2019-03-29 DIAGNOSIS — H539 Unspecified visual disturbance: Secondary | ICD-10-CM | POA: Insufficient documentation

## 2019-03-29 DIAGNOSIS — E119 Type 2 diabetes mellitus without complications: Secondary | ICD-10-CM | POA: Insufficient documentation
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
## 2019-09-30 ENCOUNTER — Emergency Department (HOSPITAL_COMMUNITY)
Admission: EM | Admit: 2019-09-30 | Discharge: 2019-09-30 | Disposition: A | Discharge status: Home / Self Care | Payer: Worker's Compensation | Attending: Emergency Medicine | Admitting: Emergency Medicine

## 2019-09-30 ENCOUNTER — Emergency Department (HOSPITAL_COMMUNITY): Payer: Worker's Compensation

## 2019-09-30 ENCOUNTER — Encounter (HOSPITAL_COMMUNITY): Payer: Self-pay | Admitting: Emergency Medicine

## 2019-09-30 DIAGNOSIS — S99922A Unspecified injury of left foot, initial encounter: Secondary | ICD-10-CM

## 2019-09-30 DIAGNOSIS — I10 Essential (primary) hypertension: Secondary | ICD-10-CM | POA: Insufficient documentation

## 2019-09-30 DIAGNOSIS — W208XXA Other cause of strike by thrown, projected or falling object, initial encounter: Secondary | ICD-10-CM | POA: Diagnosis not present

## 2019-09-30 DIAGNOSIS — Y9389 Activity, other specified: Secondary | ICD-10-CM | POA: Insufficient documentation

## 2019-09-30 DIAGNOSIS — E119 Type 2 diabetes mellitus without complications: Secondary | ICD-10-CM | POA: Diagnosis not present

## 2019-09-30 DIAGNOSIS — Z794 Long term (current) use of insulin: Secondary | ICD-10-CM | POA: Diagnosis not present

## 2019-09-30 DIAGNOSIS — Y9289 Other specified places as the place of occurrence of the external cause: Secondary | ICD-10-CM | POA: Insufficient documentation

## 2019-09-30 DIAGNOSIS — Y990 Civilian activity done for income or pay: Secondary | ICD-10-CM | POA: Diagnosis not present

## 2019-09-30 DIAGNOSIS — Z720 Tobacco use: Secondary | ICD-10-CM | POA: Insufficient documentation
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
## 2019-09-30 NOTE — ED Provider Notes (Signed)
[facility]   CSN: [id number] Arrival date & time: 09/30/19  1144     History   Chief Complaint Chief Complaint  Patient presents with  . Foot Pain    HPI Bridget HartshornGregory Yeomans is a 28 y.o. male history type 2 diabetes, DKA, asthma, hypertension, RBBB.  Patient presents today for left foot pain that occurred after a ramp fell onto his foot last night.  Patient works night shift.  He reports that he was loading a truck when it pulled away causing the ramp to hit the lateral side of his left foot he describes moderate intensity throbbing sensation constant radiating from the fifth metatarsal up his foot worsened with ambulation no alleviating factors.  He has not taken any medicine prior to arrival.  Patient denies any other injury or pain today.  He denies any fever/chills, headache, neck pain, chest pain, back pain, abdominal pain, nausea/vomiting, numbness/weakness, tingling, wound or swelling/color change or any additional concerns.     HPI  Past Medical History:  Diagnosis Date  . Asthma   . DKA (diabetic ketoacidoses) (HCC) 06/2017; 10/17/2017  . DKA (diabetic ketoacidoses) (HCC) 02/2018  . DKA (diabetic ketoacidoses) (HCC) 12/2018  . Hyperglycemia 09/24/2017  . Obesity   . Type II diabetes mellitus (HCC)    "dx'd 06/2017"    Patient Active Problem List   Diagnosis Date Noted  . Hypokalemia 03/18/2018  . Normocytic anemia 03/18/2018  . Hyperglycemia   . CAP (community acquired pneumonia) 03/17/2018  . DKA (diabetic ketoacidoses) (HCC) 03/06/2018  . Right bundle branch block (RBBB) determined by electrocardiography 08/24/2017  . Type 2 diabetes mellitus (HCC) 08/01/2017  . Smoking 08/01/2017  . HTN (hypertension) 07/25/2017    Past Surgical History:  Procedure Laterality Date  . ADENOIDECTOMY    . LACERATION REPAIR Left    "stitched finger up"  . TONSILLECTOMY          Home Medications    Prior to  Admission medications   Medication Sig Start Date End Date Taking? Authorizing Provider  Insulin Isophane & Regular Human (NOVOLIN 70/30 FLEXPEN RELION) (70-30) 100 UNIT/ML PEN Inject 16 Units into the skin 2 (two) times daily. 02/22/19   Yvette RackAgyei, Obed K, MD    Family History No family history on file.  Social History Social History   Tobacco Use  . Smoking status: Current Every Day Smoker    Packs/day: 0.10    Years: 12.00    Pack years: 1.20    Types: Cigarettes  . Smokeless tobacco: Never Used  . Tobacco comment: cutting back - now 1-2 cigarettes per day  Substance Use Topics  . Alcohol use: Yes    Comment: OCCASSIONAL  . Drug use: Yes    Types: Marijuana    Comment: 10/17/2017 ~3 times per week     Allergies   Vicodin [hydrocodone-acetaminophen], Eggs or egg-derived products, and Lactose intolerance (gi)   Review of Systems Review of Systems Ten systems are reviewed and are negative for acute change except as noted in the HPI   Physical Exam Updated Vital Signs BP (!) 137/91 (BP Location: Right Arm)   Pulse 85   Temp 98.4 F (36.9 C) (Oral)   Resp 16   SpO2 100%   Physical Exam Constitutional:      General: He is not in acute distress.    Appearance: Normal appearance. He is well-developed. He is not ill-appearing or diaphoretic.  HENT:     Head: Normocephalic  and atraumatic.     Right Ear: External ear normal.     Left Ear: External ear normal.     Nose: Nose normal.  Eyes:     General: Vision grossly intact. Gaze aligned appropriately.     Pupils: Pupils are equal, round, and reactive to light.  Neck:     Musculoskeletal: Normal range of motion.     Trachea: Trachea and phonation normal. No tracheal deviation.  Cardiovascular:     Pulses:          Dorsalis pedis pulses are 2+ on the right side and 2+ on the left side.  Pulmonary:     Effort: Pulmonary effort is normal. No respiratory distress.  Abdominal:     General: There is no distension.      Palpations: Abdomen is soft.     Tenderness: There is no abdominal tenderness. There is no guarding or rebound.  Musculoskeletal: Normal range of motion.     Left knee: Normal.     Left ankle: Normal.     Left lower leg: Normal.     Left foot: Normal range of motion and normal capillary refill. Tenderness present. No swelling, crepitus or deformity.       Feet:  Feet:     Right foot:     Protective Sensation: 5 sites tested. 5 sites sensed.     Left foot:     Protective Sensation: 5 sites tested. 5 sites sensed.     Comments: Tenderness to palpation about the fifth left metatarsal.  No overlying skin changes.  No swelling.  Capillary refill and sensation intact to all toes, movement of all toes intact some increased pain of the left toe.  Flexion, extension, inversion/eversion intact at the left ankle without pain.  Full range of motion at the left knee without pain or difficulty. Skin:    General: Skin is warm and dry.     Capillary Refill: Capillary refill takes less than 2 seconds.  Neurological:     Mental Status: He is alert.     GCS: GCS eye subscore is 4. GCS verbal subscore is 5. GCS motor subscore is 6.     Comments: Speech is clear and goal oriented, follows commands Major Cranial nerves without deficit, no facial droop Moves extremities without ataxia, coordination intact  Psychiatric:        Behavior: Behavior normal.      ED Treatments / Results  Labs (all labs ordered are listed, but only abnormal results are displayed) Labs Reviewed - No data to display  EKG None  Radiology Dg Foot Complete Left  Result Date: 09/30/2019 CLINICAL DATA:  Left foot pain after injury. EXAM: LEFT FOOT - COMPLETE 3+ VIEW COMPARISON:  None. FINDINGS: There is no evidence of fracture or dislocation. There is no evidence of arthropathy or other focal bone abnormality. Soft tissues are unremarkable. IMPRESSION: Negative. Electronically Signed   By: Lupita Raider M.D.   On: 09/30/2019  12:42    Procedures Procedures (including critical care time)  Medications Ordered in ED Medications - No data to display   Initial Impression / Assessment and Plan / ED Course  I have reviewed the triage vital signs and the nursing notes.  Pertinent labs & imaging results that were available during my care of the patient were reviewed by me and considered in my medical decision making (see chart for details).    Left fifth metatarsal pain after a ramp hit his foot last night,  no sign of injury.  There is mild tenderness to palpation to the area.  Range of motion intact to all joints of the left lower extremity with appropriate range of motion and strength.  Good capillary refill to all toes, sensation intact to all toes, pedal pulses intact and equal.  No evidence of cellulitis, septic arthritis, DVT, compartment syndrome or laxity.  Is overall well-appearing in no acute distress.  DG left foot negative.  Patient placed in a postop shoe and given crutches as well as work note.  He was given referral to Ortho for follow-up.  Patient aware that unseen fractures along with ligamentous and tendon injury may be present despite reassuring x-rays and that follow-up is advised.  He has been advised on rice therapy and OTC anti-inflammatories.  At this time there does not appear to be any evidence of an acute emergency medical condition and the patient appears stable for discharge with appropriate outpatient follow up. Diagnosis was discussed with patient who verbalizes understanding of care plan and is agreeable to discharge. I have discussed return precautions with patient who verbalizes understanding of return precautions. Patient encouraged to follow-up with their PCP and ortho. All questions answered. Patient has been discharged in good condition.  Note: Portions of this report may have been transcribed using voice recognition software. Every effort was made to ensure accuracy; however, inadvertent  computerized transcription errors may still be present. Final Clinical Impressions(s) / ED Diagnoses   Final diagnoses:  Foot injury, left, initial encounter    ED Discharge Orders    None       Gari Crown 09/30/19 1322    Sherwood Gambler, MD 10/06/19 2134

## 2019-09-30 NOTE — ED Notes (Signed)
Ortho tech at bedside 

## 2019-09-30 NOTE — Discharge Instructions (Addendum)
You have been diagnosed today with Left foot injury.  At this time there does not appear to be the presence of an emergent medical condition, however there is always the potential for conditions to change. Please read and follow the below instructions.  Please return to the Emergency Department immediately for any new or worsening symptoms. Please be sure to follow up with your Primary Care Provider within one week regarding your visit today; please call their office to schedule an appointment even if you are feeling better for a follow-up visit. Your x-ray today was reassuring however unseen fractures along with ligamentous, tendon and other soft tissue injury may be present.  Please call the orthopedist Dr. Doran Durand on your discharge paperwork to schedule a follow-up appointment for further evaluation and treatment.  Please use rest, ice and elevation to help with your symptoms.  You may use the crutches and postop shoe given to you today to help with your symptoms.  Get help right away if: Your foot is numb or tingling. Your foot or toes are swollen. Your foot or toes turn white or blue. You have warmth and redness along your foot. You have any new/concerning or worsening of symptom  Please read the additional information packets attached to your discharge summary.  Do not take your medicine if  develop an itchy rash, swelling in your mouth or lips, or difficulty breathing; call 911 and seek immediate emergency medical attention if this occurs.  Note: Portions of this text may have been transcribed using voice recognition software. Every effort was made to ensure accuracy; however, inadvertent computerized transcription errors may still be present.

## 2019-10-14 ENCOUNTER — Other Ambulatory Visit: Payer: Self-pay

## 2019-10-14 ENCOUNTER — Emergency Department (HOSPITAL_COMMUNITY): Payer: Self-pay

## 2019-10-14 ENCOUNTER — Encounter (HOSPITAL_COMMUNITY): Payer: Self-pay | Admitting: Emergency Medicine

## 2019-10-14 ENCOUNTER — Emergency Department (HOSPITAL_COMMUNITY)
Admission: EM | Admit: 2019-10-14 | Discharge: 2019-10-14 | Disposition: A | Discharge status: Home / Self Care | Payer: Self-pay | Attending: Emergency Medicine | Admitting: Emergency Medicine

## 2019-10-14 DIAGNOSIS — Z5320 Procedure and treatment not carried out because of patient's decision for unspecified reasons: Secondary | ICD-10-CM | POA: Insufficient documentation

## 2019-10-14 DIAGNOSIS — R739 Hyperglycemia, unspecified: Secondary | ICD-10-CM

## 2019-10-14 DIAGNOSIS — F17210 Nicotine dependence, cigarettes, uncomplicated: Secondary | ICD-10-CM | POA: Insufficient documentation

## 2019-10-14 DIAGNOSIS — E1065 Type 1 diabetes mellitus with hyperglycemia: Secondary | ICD-10-CM | POA: Insufficient documentation

## 2019-10-14 DIAGNOSIS — R350 Frequency of micturition: Secondary | ICD-10-CM | POA: Insufficient documentation

## 2019-10-14 DIAGNOSIS — F1290 Cannabis use, unspecified, uncomplicated: Secondary | ICD-10-CM | POA: Insufficient documentation

## 2019-10-14 DIAGNOSIS — R110 Nausea: Secondary | ICD-10-CM | POA: Insufficient documentation

## 2019-10-14 DIAGNOSIS — R072 Precordial pain: Secondary | ICD-10-CM | POA: Insufficient documentation

## 2019-10-14 LAB — CBC
HCT: 54.3 % — ABNORMAL HIGH (ref 39.0–52.0)
Hemoglobin: 19.1 g/dL — ABNORMAL HIGH (ref 13.0–17.0)
MCH: 30.8 pg (ref 26.0–34.0)
MCHC: 35.2 g/dL (ref 30.0–36.0)
MCV: 87.4 fL (ref 80.0–100.0)
Platelets: 363 10*3/uL (ref 150–400)
RBC: 6.21 MIL/uL — ABNORMAL HIGH (ref 4.22–5.81)
RDW: 11.8 % (ref 11.5–15.5)
WBC: 7.3 10*3/uL (ref 4.0–10.5)
nRBC: 0 % (ref 0.0–0.2)

## 2019-10-14 LAB — URINALYSIS, ROUTINE W REFLEX MICROSCOPIC
Bacteria, UA: NONE SEEN
Bilirubin Urine: NEGATIVE
Glucose, UA: >=500 mg/dL — AB
Hgb urine dipstick: NEGATIVE
Ketones, ur: 5 mg/dL — AB
Leukocytes,Ua: NEGATIVE
Nitrite: NEGATIVE
Protein, ur: NEGATIVE mg/dL
Specific Gravity, Urine: 1.027 (ref 1.005–1.030)
pH: 6 (ref 5.0–8.0)

## 2019-10-14 LAB — CBG MONITORING, ED
Glucose-Capillary: >600 mg/dL (ref 70–99)
Glucose-Capillary: >600 mg/dL (ref 70–99)
Glucose-Capillary: >600 mg/dL (ref 70–99)

## 2019-10-14 LAB — BETA-HYDROXYBUTYRIC ACID: Beta-Hydroxybutyric Acid: 1.82 mmol/L — ABNORMAL HIGH (ref 0.05–0.27)

## 2019-10-14 MED ORDER — SODIUM CHLORIDE 0.9% FLUSH: 3.0000 mL | Freq: Once | INTRAVENOUS | Status: DC

## 2019-10-14 MED ORDER — LACTATED RINGERS IV BOLUS
2000.0000 mL | Freq: Once | INTRAVENOUS | Status: AC
  Administered 2019-10-14: 1000 mL via INTRAVENOUS

## 2019-10-14 NOTE — ED Provider Notes (Signed)
[facility] Provider [facility]   CSN: [id number] Arrival date & time: 10/14/19  1353     History Chief Complaint  Patient presents with  . Hyperglycemia  . Chest Pain    Roger HartshornGregory Whalin is a 28 y.o. male.  Patient with history of type 1 diabetes presents to the emergency department with complaint of "dehydration".  Patient states that he felt worse this morning prompting evaluation.  He states that he ran out of insulin about a month ago, found one of his insulin pens which he ran out of about 2 weeks ago.  Since that time he has not been taking any medicine for diabetes.  He denies any illnesses including fevers, URI symptoms or flu symptoms.  He reports some intermittent sharp chest pains, especially at night.  Denies any current chest pain.  He has had some nausea but no vomiting.  He has been constipated and has not had any diarrhea.  He has some mild abdominal cramps which are intermittent.  No lower extremity swelling.  No skin rashes.  No traumas or other problems.  Nursing note states that the patient reported chest pain starting this morning.  This is different than what he tells me during my history.        Past Medical History:  Diagnosis Date  . Asthma   . DKA (diabetic ketoacidoses) (HCC) 06/2017; 10/17/2017  . DKA (diabetic ketoacidoses) (HCC) 02/2018  . DKA (diabetic ketoacidoses) (HCC) 12/2018  . Hyperglycemia 09/24/2017  . Obesity   . Type II diabetes mellitus (HCC)    "dx'd 06/2017"    Patient Active Problem List   Diagnosis Date Noted  . Hypokalemia 03/18/2018  . Normocytic anemia 03/18/2018  . Hyperglycemia   . CAP (community acquired pneumonia) 03/17/2018  . DKA (diabetic ketoacidoses) (HCC) 03/06/2018  . Right bundle branch block (RBBB) determined by electrocardiography 08/24/2017  . Type 2 diabetes mellitus (HCC) 08/01/2017  . Smoking 08/01/2017  . HTN (hypertension) 07/25/2017    Past Surgical History:    Procedure Laterality Date  . ADENOIDECTOMY    . LACERATION REPAIR Left    "stitched finger up"  . TONSILLECTOMY         No family history on file.  Social History   Tobacco Use  . Smoking status: Current Every Day Smoker    Packs/day: 0.10    Years: 12.00    Pack years: 1.20    Types: Cigarettes  . Smokeless tobacco: Never Used  . Tobacco comment: cutting back - now 1-2 cigarettes per day  Substance Use Topics  . Alcohol use: Yes    Comment: OCCASSIONAL  . Drug use: Yes    Types: Marijuana    Comment: 10/17/2017 ~3 times per week    Home Medications Prior to Admission medications   Medication Sig Start Date End Date Taking? Authorizing Provider  Insulin Isophane & Regular Human (NOVOLIN 70/30 FLEXPEN RELION) (70-30) 100 UNIT/ML PEN Inject 16 Units into the skin 2 (two) times daily. 02/22/19   [name], Roger K, MD    Allergies    Vicodin [hydrocodone-acetaminophen], Eggs or egg-derived products, and Lactose intolerance (gi)  Review of Systems   Review of Systems  Constitutional: Negative for diaphoresis and fever.  Eyes: Negative for redness.  Respiratory: Negative for cough and shortness of breath.   Cardiovascular: Positive for chest pain. Negative for palpitations and leg swelling.  Gastrointestinal: Positive for abdominal pain, constipation and nausea. Negative for blood in stool and vomiting.  Endocrine: Positive for polyuria.  Genitourinary: Negative for dysuria.  Musculoskeletal: Negative for back pain and neck pain.  Skin: Negative for rash.  Neurological: Negative for syncope and light-headedness.  Psychiatric/Behavioral: The patient is not nervous/anxious.     Physical Exam Updated Vital Signs BP (!) 112/96 (BP Location: Right Arm)   Pulse 90   Temp 98 F (36.7 C) (Oral)   Resp 16   Wt 72.6 kg   SpO2 98%   BMI 25.83 kg/m   Physical Exam Vitals and nursing note reviewed.  Constitutional:      Appearance: He is well-developed.  HENT:      Head: Normocephalic and atraumatic.     Mouth/Throat:     Comments: Mildly dry mucous membranes. Eyes:     General:        Right eye: No discharge.        Left eye: No discharge.     Conjunctiva/sclera: Conjunctivae normal.  Cardiovascular:     Rate and Rhythm: Regular rhythm. Tachycardia present.     Heart sounds: Normal heart sounds.     Comments: HR around 100 at time of exam.  Pulmonary:     Effort: Pulmonary effort is normal.     Breath sounds: Normal breath sounds.  Abdominal:     Palpations: Abdomen is soft.     Tenderness: There is no abdominal tenderness. There is no guarding or rebound.  Musculoskeletal:     Cervical back: Normal range of motion and neck supple.     Right lower leg: No edema.     Left lower leg: No edema.  Skin:    General: Skin is warm and dry.  Neurological:     General: No focal deficit present.     Mental Status: He is alert and oriented to person, place, and time.     ED Results / Procedures / Treatments   Labs (all labs ordered are listed, but only abnormal results are displayed) Labs Reviewed  CBC - Abnormal; Notable for the following components:      Result Value   RBC 6.21 (*)    Hemoglobin 19.1 (*)    HCT 54.3 (*)    All other components within normal limits  BETA-HYDROXYBUTYRIC ACID - Abnormal; Notable for the following components:   Beta-Hydroxybutyric Acid 1.82 (*)    All other components within normal limits  CBG MONITORING, ED - Abnormal; Notable for the following components:   Glucose-Capillary >600 (*)    All other components within normal limits  CBG MONITORING, ED - Abnormal; Notable for the following components:   Glucose-Capillary >600 (*)    All other components within normal limits  CBG MONITORING, ED - Abnormal; Notable for the following components:   Glucose-Capillary >600 (*)    All other components within normal limits  URINALYSIS, ROUTINE W REFLEX MICROSCOPIC  COMPREHENSIVE METABOLIC PANEL  TROPONIN I (HIGH  SENSITIVITY)    ED ECG REPORT   Date: 10/14/2019  Rate: 101   Rhythm: normal sinus rhythm  QRS Axis: indeterminate  Intervals: normal  ST/T Wave abnormalities: nonspecific T wave changes  Conduction Disutrbances:right bundle branch block  Narrative Interpretation:   Old EKG Reviewed: unchanged, actually slower with more modest t-wave peaking today  I have personally reviewed the EKG tracing and agree with the computerized printout as noted.  Radiology DG Chest 2 View  Result Date: 10/14/2019 CLINICAL DATA:  Chest pain.  Hyperglycemia. EXAM: CHEST - 2 VIEW COMPARISON:  January 16, 2019 FINDINGS: Lungs  are clear. Heart size and pulmonary vascularity are normal. No adenopathy. No pneumothorax. No bone lesions. IMPRESSION: No edema or consolidation.  No adenopathy. Electronically Signed   By: Bretta Bang III M.D.   On: 10/14/2019 14:51    Procedures Procedures (including critical care time)  Medications Ordered in ED Medications  sodium chloride flush (NS) 0.9 % injection 3 mL (has no administration in time range)    ED Course  I have reviewed the triage vital signs and the nursing notes.  Pertinent labs & imaging results that were available during my care of the patient were reviewed by me and considered in my medical decision making (see chart for details).    MDM Rules/Calculators/A&P                      Patient seen and examined. Work-up reviewed.  Patient only had chest pain protocol orders sent.  Added hepatic function panel given his abdominal pain and beta hydroxybutyrate.  Vital signs reviewed and are as follows: BP (!) 129/98 (BP Location: Right Arm)   Pulse 93   Temp 98 F (36.7 C) (Oral)   Resp 17   Wt 72.6 kg   SpO2 100%   BMI 25.83 kg/m   7:46 PM Called the patient's room by RN.  Patient is requesting to leave.  I went to the patient's bedside.  I asked him if he is asking to leave.  He states "I am leaving, I am not asking".  I discussed with him  that I am unable to rule out DKA at this point.  He has received IV fluids.  Unfortunately chemistry needs to be redrawn.  I discussed that if he goes home, he can get worse and possibly die from DKA.  He states that he knows.  I asked him if something happened to make him want to leave or if there is anything I can do to change his mind, he looks away from me and does not give a reason.  RN at bedside discontinuing IV.  I told the patient that if he changes his mind, feels worse, or would like treatment that he can return to the emergency department at any time.   Final Clinical Impression(s) / ED Diagnoses Final diagnoses:  Hyperglycemia  Precordial pain   Patient left AMA during evaluation.  Discussion as above.  It is unclear as to why patient decided to leave.  Rx / DC Orders ED Discharge Orders    None       Renne Crigler, Cordelia Poche 10/14/19 1954    Alvira Monday, MD 10/17/19 [phone]

## 2019-10-14 NOTE — ED Notes (Signed)
PA spoke to pt about risks of AMA.

## 2019-10-14 NOTE — ED Triage Notes (Signed)
Pt in with hyperglycemia, CBG >600 and chest pain. States cp began this am, hx of DM. States he has not taken his meds in awhile. Also having nausea

## 2019-12-01 ENCOUNTER — Other Ambulatory Visit: Payer: Self-pay | Admitting: Orthopedic Surgery

## 2019-12-01 DIAGNOSIS — M79672 Pain in left foot: Secondary | ICD-10-CM

## 2019-12-09 ENCOUNTER — Other Ambulatory Visit: Payer: Self-pay

## 2020-03-19 IMAGING — DX DG CHEST 2V
2 series · 2 of 2 positions shown · non-contrast
Comparison: Chest x-ray dated August 27, 2018.

CLINICAL DATA: Right-sided pleuritic chest pain and shortness of
breath for the past 2 weeks.

EXAM:
CHEST - 2 VIEW

[chest pa]
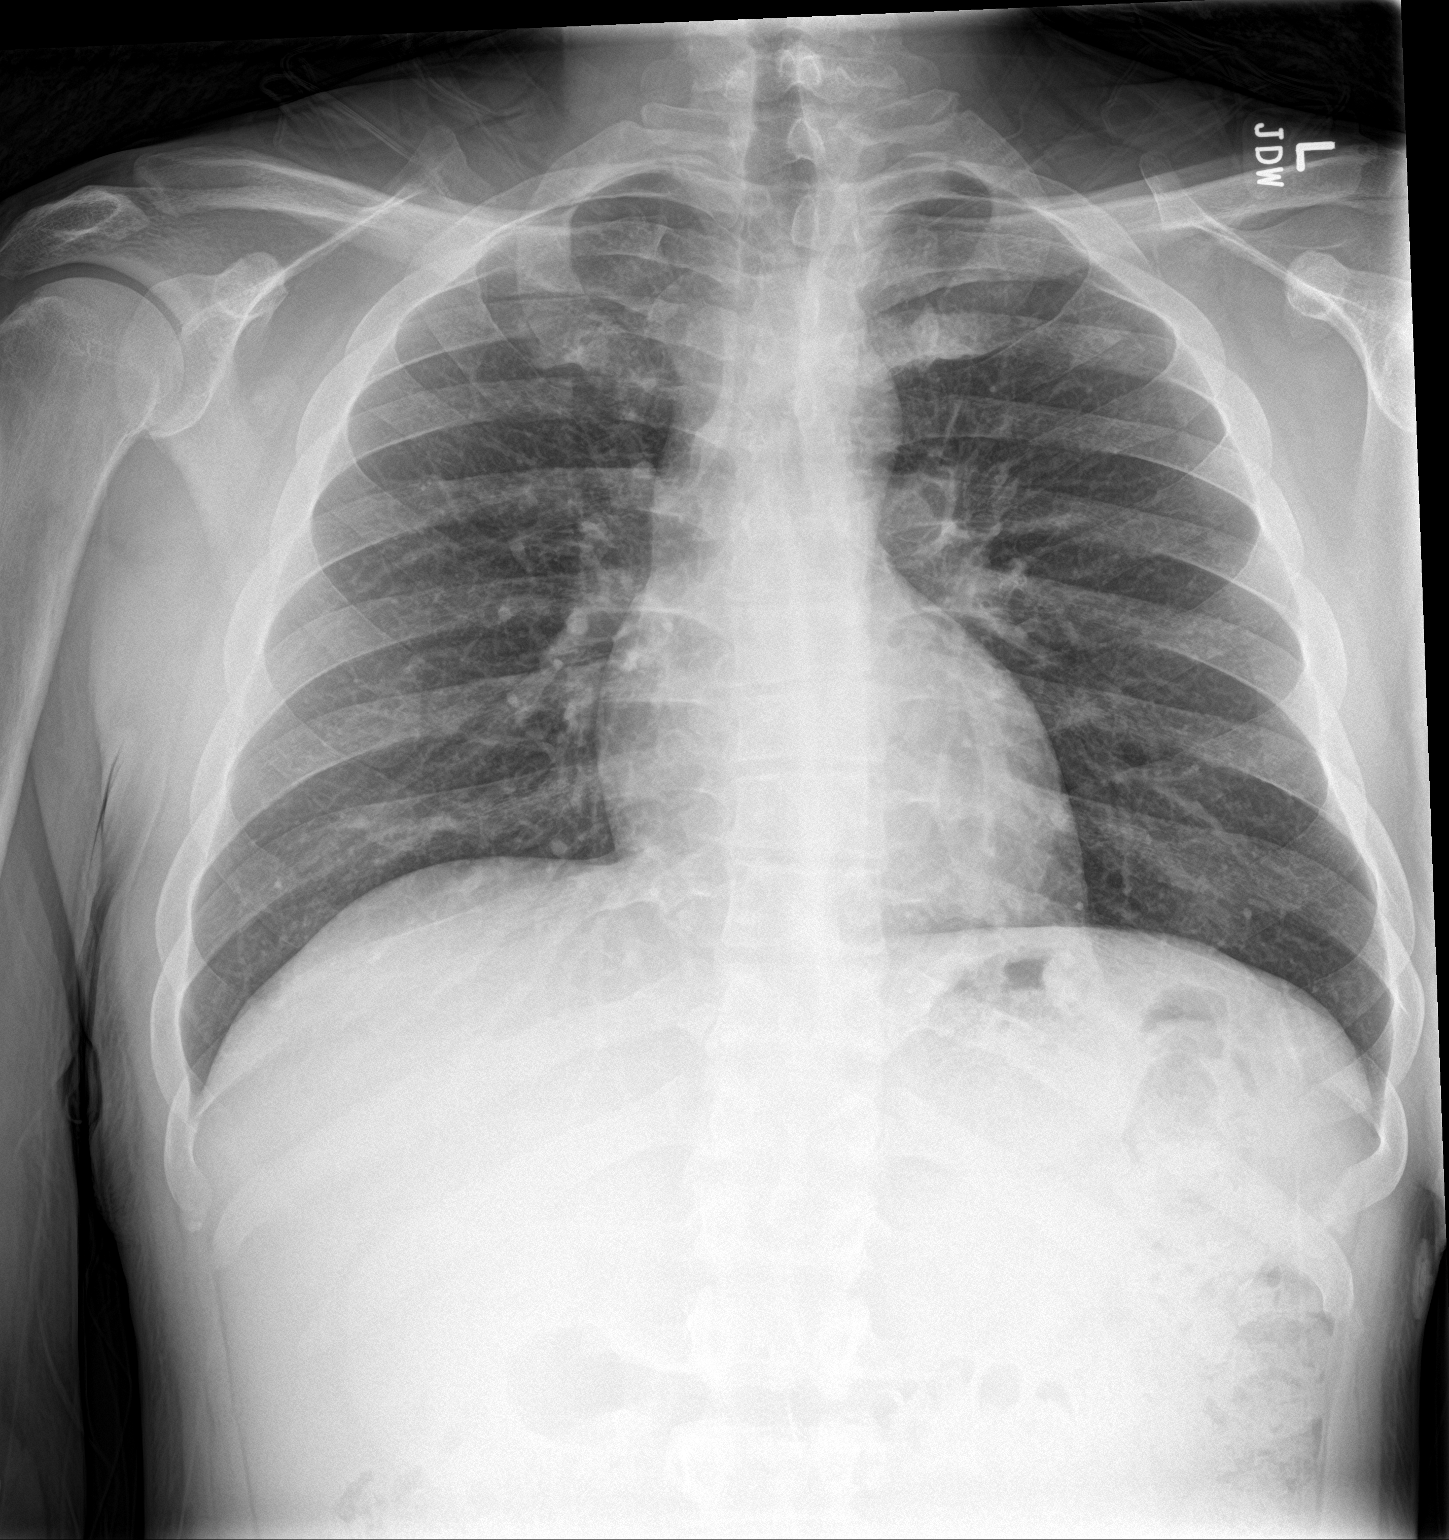

[chest lat]
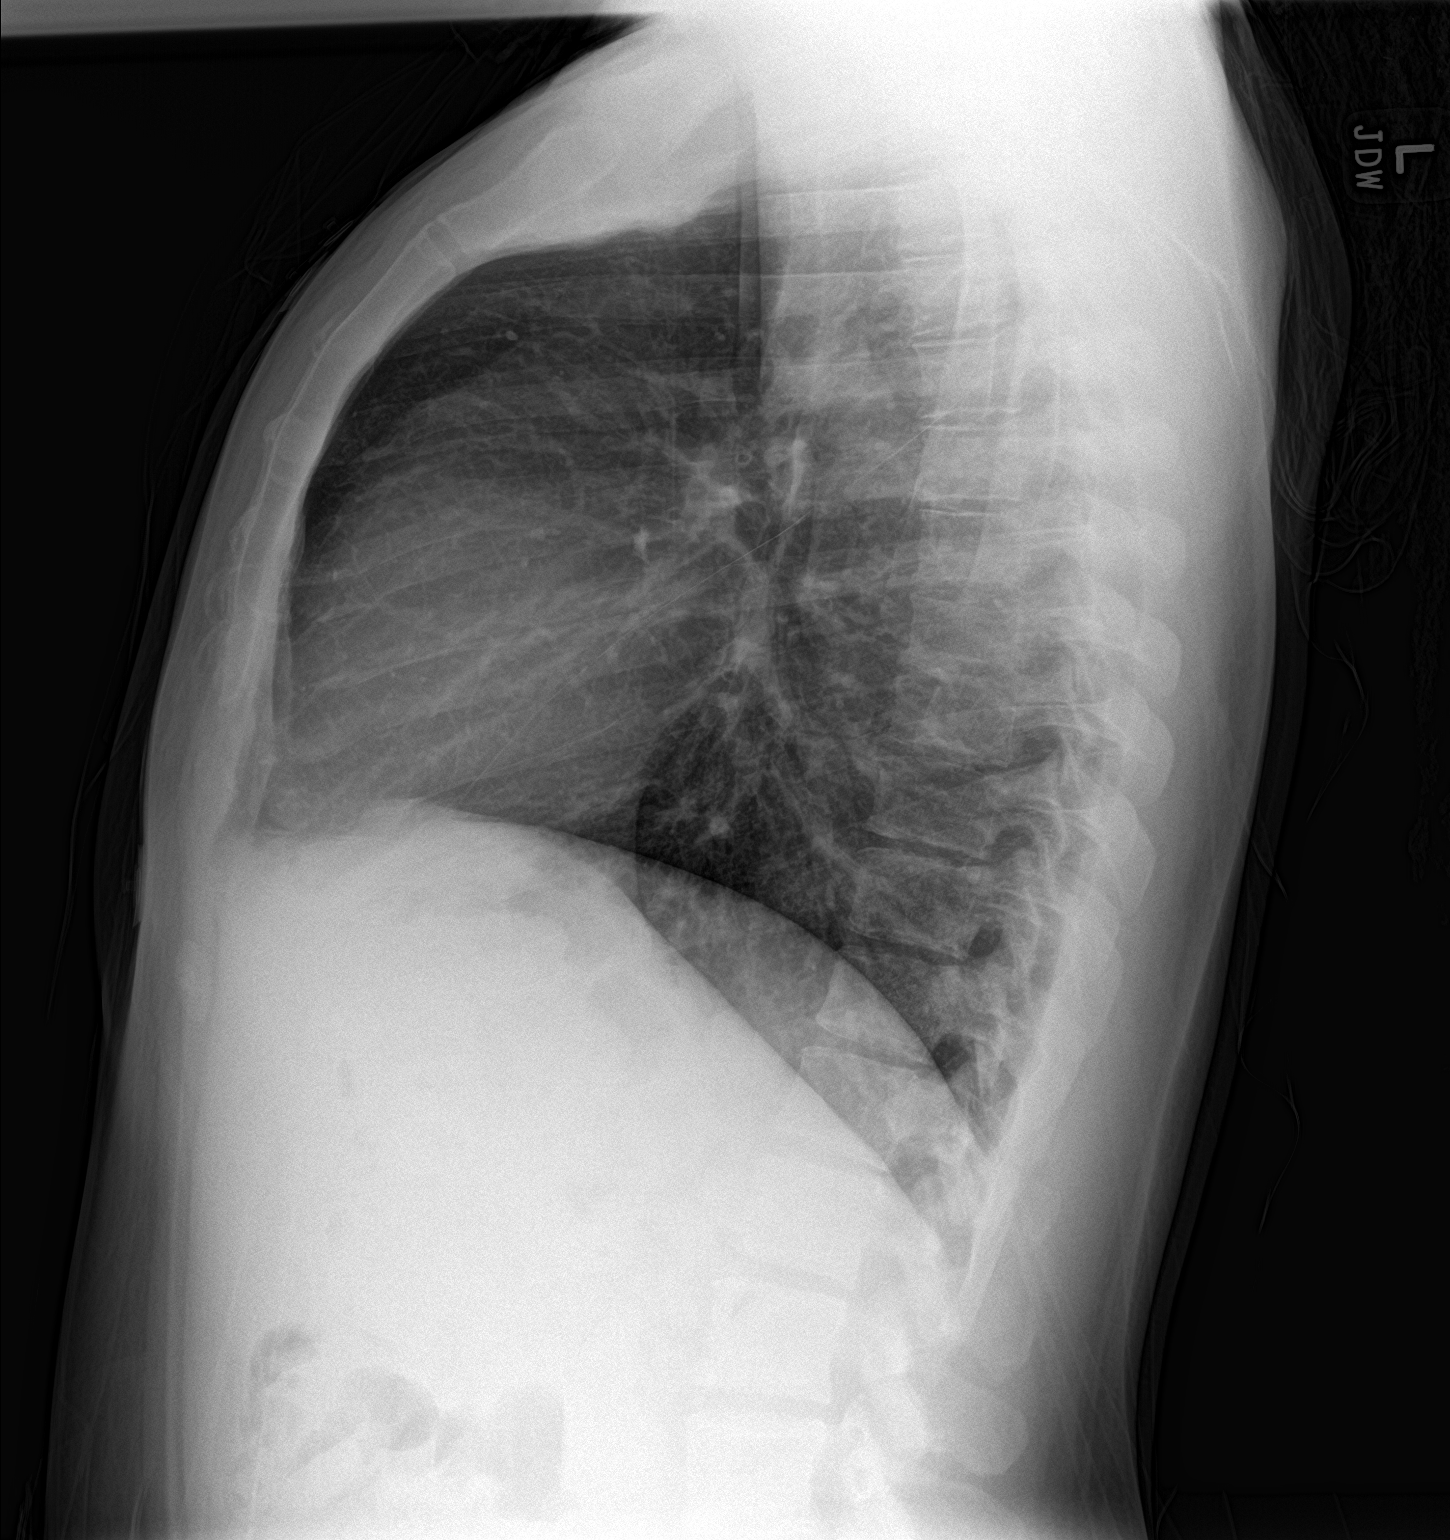

[2 of 2 positions shown; findings below may reference images not displayed]

FINDINGS: The heart size and mediastinal contours are within normal limits.
Normal pulmonary vascularity. Minimal streaky linear density in the
right lower lobe, likely atelectasis. No focal consolidation,
pleural effusion, or pneumothorax. No acute osseous abnormality.
IMPRESSION: No active cardiopulmonary disease.

## 2020-05-12 IMAGING — CR CHEST - 2 VIEW
2 series · 2 of 2 positions shown · non-contrast
Comparison: November 18, 2018.

CLINICAL DATA: Shortness of breath and chest pressure

EXAM:
CHEST - 2 VIEW

[chest pa]
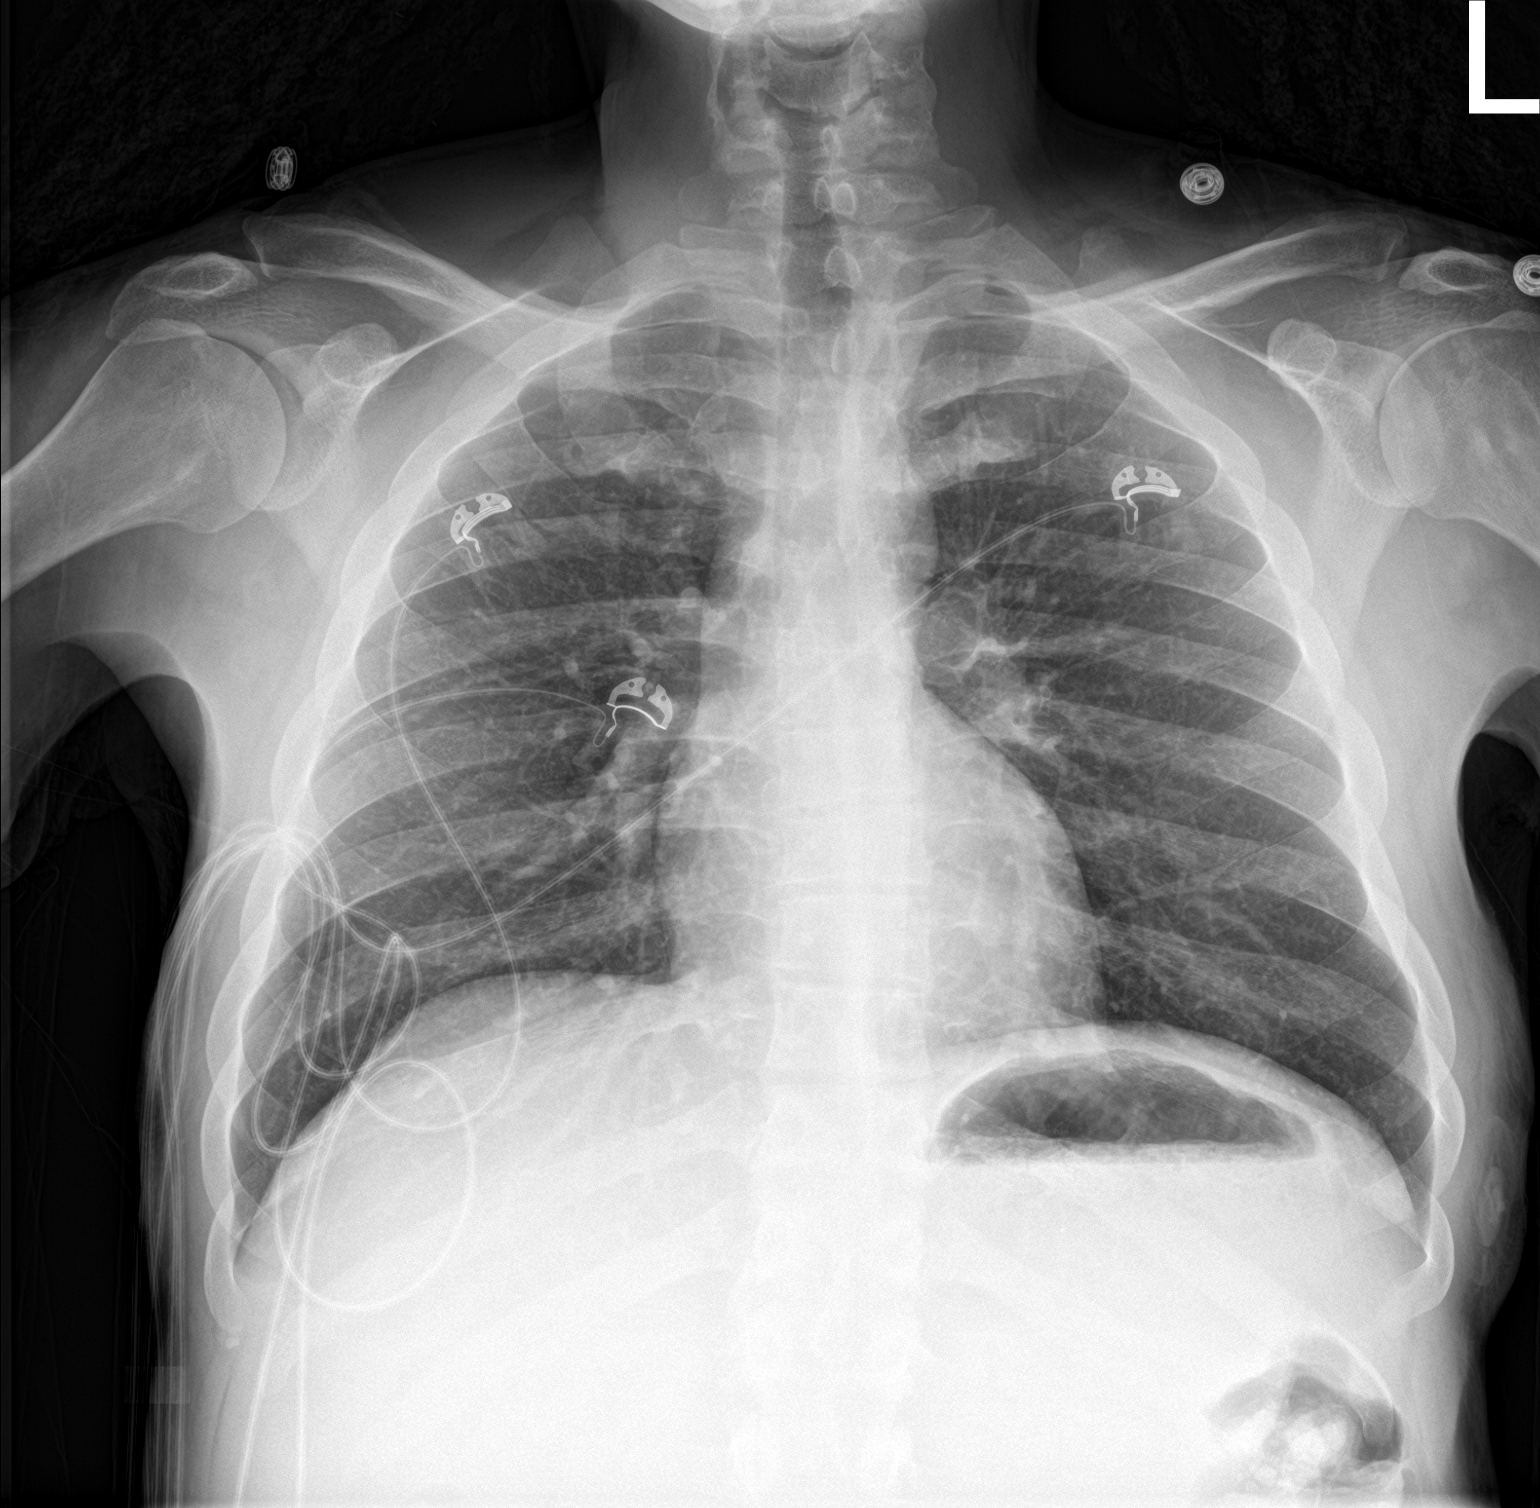

[chest lat]
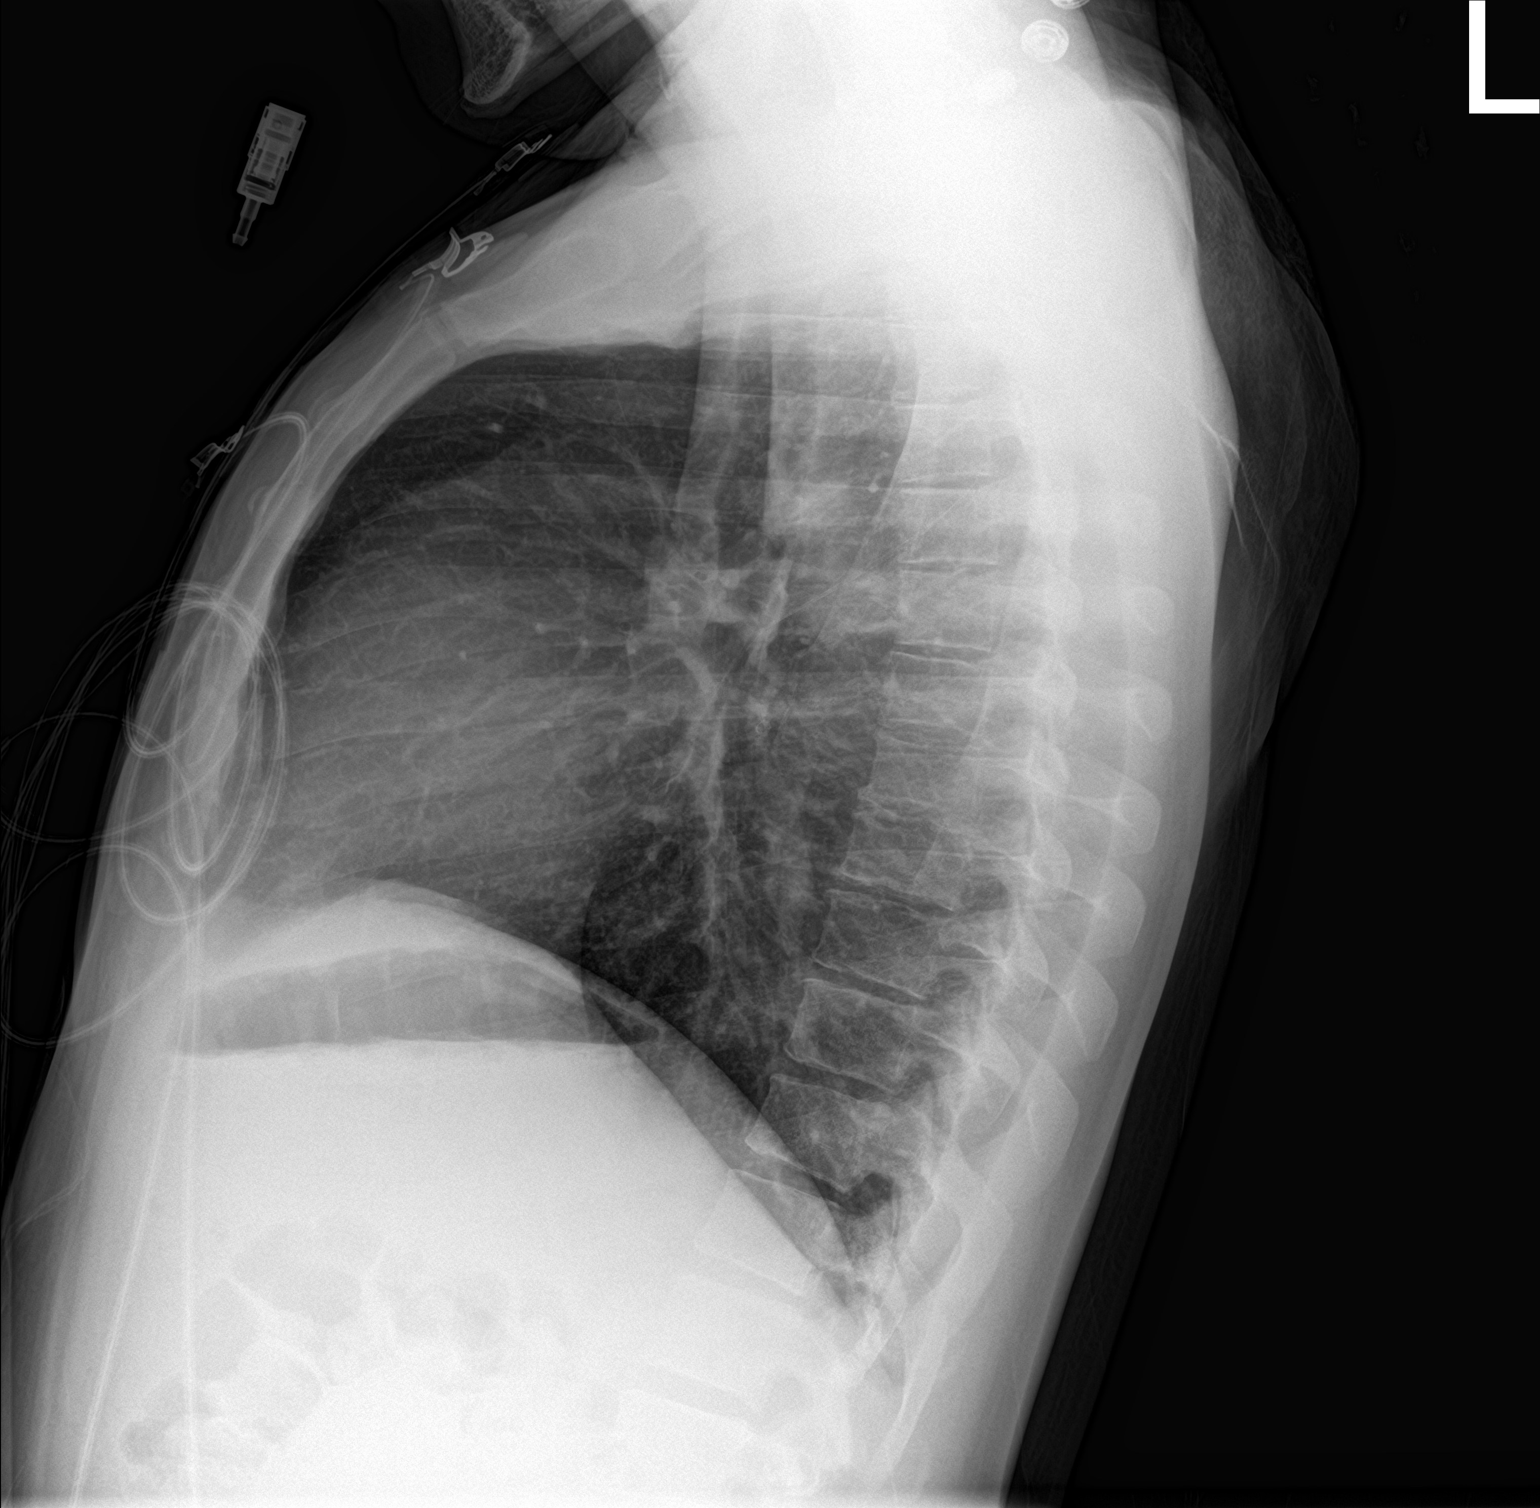

[2 of 2 positions shown; findings below may reference images not displayed]

FINDINGS: There is no edema or consolidation. The heart size and pulmonary
vascularity are normal. No adenopathy. No bone lesions.
IMPRESSION: No edema or consolidation.

## 2020-07-23 ENCOUNTER — Ambulatory Visit: Payer: Self-pay | Admitting: Student

## 2020-07-23 ENCOUNTER — Other Ambulatory Visit: Payer: Self-pay

## 2020-07-23 ENCOUNTER — Other Ambulatory Visit (HOSPITAL_COMMUNITY): Payer: Self-pay | Admitting: Student

## 2020-07-23 ENCOUNTER — Encounter: Payer: Self-pay | Admitting: Student

## 2020-07-23 VITALS — BP 132/82 | HR 85 | Temp 98.3°F | Ht 66.0 in | Wt 167.3 lb

## 2020-07-23 DIAGNOSIS — E1142 Type 2 diabetes mellitus with diabetic polyneuropathy: Secondary | ICD-10-CM

## 2020-07-23 DIAGNOSIS — F17200 Nicotine dependence, unspecified, uncomplicated: Secondary | ICD-10-CM

## 2020-07-23 DIAGNOSIS — E1110 Type 2 diabetes mellitus with ketoacidosis without coma: Secondary | ICD-10-CM

## 2020-07-23 DIAGNOSIS — G629 Polyneuropathy, unspecified: Secondary | ICD-10-CM | POA: Insufficient documentation

## 2020-07-23 DIAGNOSIS — F17210 Nicotine dependence, cigarettes, uncomplicated: Secondary | ICD-10-CM

## 2020-07-23 LAB — POCT GLYCOSYLATED HEMOGLOBIN (HGB A1C): Hemoglobin A1C: 7 % — AB (ref 4.0–5.6)

## 2020-07-23 LAB — GLUCOSE, CAPILLARY: Glucose-Capillary: 120 mg/dL — ABNORMAL HIGH (ref 70–99)

## 2020-07-23 MED ORDER — PEN NEEDLES 32G X 4 MM MISC: 1.0000 | Freq: Every day | 7 refills | Status: DC

## 2020-07-23 MED ORDER — DULOXETINE HCL 60 MG PO CPEP: 60.0000 mg | ORAL_CAPSULE | Freq: Every day | ORAL | 2 refills | Status: DC

## 2020-07-23 MED ORDER — INSULIN DETEMIR 100 UNIT/ML FLEXPEN: 20.0000 [IU] | Freq: Every day | SUBCUTANEOUS | 11 refills | Status: DC

## 2020-07-23 MED ORDER — INSULIN DETEMIR 100 UNIT/ML ~~LOC~~ SOLN: 20.0000 [IU] | Freq: Every day | SUBCUTANEOUS | 3 refills | Status: DC

## 2020-07-23 MED ORDER — METFORMIN HCL 500 MG PO TABS: 500.0000 mg | ORAL_TABLET | Freq: Every day | ORAL | 11 refills | Status: DC

## 2020-07-23 MED FILL — METFORMIN HCL 500 MG TABS: 500 | 30 days supply | Qty: 30 | Fill #0

## 2020-07-23 MED FILL — UNIFINE PENTIPS 32GX5/32: 32G X 4 MM | 30 days supply | Qty: 100 | Fill #0

## 2020-07-23 MED FILL — LEVEMIR FLEXTOUCH 100 UNITS: 100 | 30 days supply | Qty: 6 | Fill #0

## 2020-07-23 MED FILL — DULoxetine HCL 60 MG CPEP: 60 | 30 days supply | Qty: 30 | Fill #0

## 2020-07-23 NOTE — Assessment & Plan Note (Signed)
Patient complains of polyneuropathy symptoms started 3 months ago, which include shooting pain started from his feet up to his thighs and lower back.  He went to [facility] emergency room last month and was started on gabapentin which she takes 3 times a day, unknown dosage.  Patient ran out of gabapentin and has been using his grandmom's gabapentin.  Patient reports no relief with gabapentin.  Patient states that he is in much distress due to neuropathy and would like to have some medication for relief.  This is likely diabetic neuropathy.  However cannot completely exclude other causes of neuropathy such as B12 deficiency or HIV.  Will obtain a more comprehensive blood works when patient has the orange card.  In the meantime duloxetine 60 mg is started.  Will consider adding gabapentin if no improvement with symptoms in a few weeks.  Plan: -Duloxetine 60 mg daily -Consider adding gabapentin if symptoms not improved -We will comprehensive blood work after patient has the orange card

## 2020-07-23 NOTE — Assessment & Plan Note (Signed)
Patient was diagnosed with type 2 diabetes in September 2018 with multiple hospitalizations for DKA.  His A1c was greater than 14 he was started on insulin.  Due to moving to [place], he lost to follow-up with [place] since 2019.  He has been using his mom's insulin Basaglar 20 units either once or twice a day depends on his blood sugar.  Patient state that he was on Metformin in the past but could not tolerate the GI side effects.  He is willing to restart Metformin at a lower dose.  His A1c today is 7.  I will start him on insulin Levemir 20 units once a day.  We will also restart Metformin at 500 mg once a day and slowly titrate up.  Patient could not give a urine sample for microalbumin/creatinine ratio.  Patient is applying for orange car to have an eye exam referral.  Plan: -Levemir 20 units once a day -Metformin 500 mg once a day -Recheck A1c in 3 months

## 2020-07-23 NOTE — Patient Instructions (Signed)
[name],  It is a pleasure getting to know you today.  Here is a summary of what talked about:  1.  Type 2 diabetes: Your A1c today is 7.  We would like to keep your A1c less than 7.  I will start you on long-acting insulin Levemir, please give 20 units once a day.  I will also restart you back on Metformin 500 mg, take it once a day.  Please continue checking your blood sugar and let us know if is too high or too low.  We will set you up with our financial advisor to have the orange card, then we can send a referral to an eye exam.  2.  Neuropathy: This is likely due to your diabetes.  I will start you on duloxetine 60 mg once a day.  Please allow at least 2 weeks for the medication to be effective.  Call us if you have any questions or symptoms are not improving.  Once you have the orange card, we can order more blood test to rule out other causes of neuropathy such as vitamin B12 deficiencies.  Please cut back on the smoking as much as tolerable.  Please set up patient with the financial advisor for the orange card.  Please contact us for any questions or concern.  Please come back in 3 months to recheck A1c or sooner if symptoms are not improving.  Take care  Dr. Cyndie Chime

## 2020-07-23 NOTE — Assessment & Plan Note (Signed)
Patient is smoking 1 pack a day ever since teenager.  He is advised on cutting back and agrees to do half a pack a day and to his next visit.  He declines nicotine patch.  We will continue conversation of smoking cessation.  Plan: -Reassess the amount next visit -Goal half a pack a day next visit

## 2020-07-23 NOTE — Progress Notes (Signed)
CC: Diabetes management and new onset of neuropathy  HPI:  [name] is a 29 y.o. with past medical history of diabetes type 2 with multiple hospitalizations for DKA's due to insulin nonadherence, who presented to the office for diabetes management and new onset of neuropathy.  Patient was diagnosed with type 2 diabetes in September 2018 with A1c greater than 14 and was started on insulin.  He lost to follow-up with [place] since 2019 because he moved to [place].  He has not seen a PCP when he was in [place] and has been using his mom's insulin Basaglar.  Patient states that he injects 20 units either once a day or twice a day depends on his blood glucose.  He denies nausea, vomiting, headache, polyuria or polydipsia.  Patient complains of new onset of neuropathy started about 3 months ago.  He said that he had shooting pain started from his feet up to his thighs and lower back.  He went to [facility] emergency room last month and was started on gabapentin which she takes 3 times a day, unknown dosage.  Patient ran out of gabapentin and has been using his grandmom's gabapentin.  Patient reports no relief with gabapentin.  Patient states that he is in much distress due to neuropathy and would like to have some medication for relief.  Please see problem based charting for further detail.  Family history Strong family history of diabetes in both sides of family  Social history Smoking: Has been smoking since teenager, smoked 1 pack a day Alcohol: No Drugs: Patient state he has been using Suboxone which he obtains from the streets to help with his neuropathy  Surgical history No  Past Medical History:  Diagnosis Date  . Asthma   . DKA (diabetic ketoacidoses) (HCC) 06/2017; 10/17/2017  . DKA (diabetic ketoacidoses) (HCC) 02/2018  . DKA (diabetic ketoacidoses) (HCC) 12/2018  . Hyperglycemia 09/24/2017  . Obesity   . Type II diabetes mellitus (HCC)    "dx'd 06/2017"    Review of Systems:   Review of Systems  Constitutional: Positive for malaise/fatigue.  HENT: Negative.   Eyes: Positive for blurred vision.  Gastrointestinal: Positive for abdominal pain. Negative for nausea and vomiting.  Musculoskeletal: Positive for back pain and joint pain.  Skin: Negative.   Neurological: Positive for tingling.  Psychiatric/Behavioral: Positive for depression.    Physical Exam:  Vitals:   07/23/20 1418  BP: 132/82  Pulse: 85  Temp: 98.3 F (36.8 C)  TempSrc: Oral  SpO2: 98%  Weight: 167 lb 4.8 oz (75.9 kg)  Height: 5\' 6"  (1.676 m)   Physical Exam Constitutional:      Appearance: He is not toxic-appearing.  HENT:     Head: Normocephalic.  Eyes:     General:        Right eye: No discharge.        Left eye: No discharge.  Cardiovascular:     Rate and Rhythm: Normal rate and regular rhythm.     Heart sounds: No murmur heard.   Pulmonary:     Breath sounds: Normal breath sounds. No wheezing.  Abdominal:     General: Bowel sounds are normal.  Musculoskeletal:     Right lower leg: No edema.     Left lower leg: No edema.     Comments: +2 pulses bilaterally No wound or ulcers noted No shooting pain with palpation of bilateral feet  Skin:    General: Skin is warm.  Coloration: Skin is not jaundiced.  Neurological:     Mental Status: He is alert.     Gait: Gait abnormal.  Psychiatric:     Comments: Sad affect     Assessment & Plan:   See Encounters Tab for problem based charting.  Patient seen with Dr. Heide Spark

## 2020-07-26 NOTE — Progress Notes (Signed)
Internal Medicine Clinic Attending  I saw and evaluated the patient.  I personally confirmed the key portions of the history and exam documented by Dr. Nguyen and I reviewed pertinent patient test results.  The assessment, diagnosis, and plan were formulated together and I agree with the documentation in the resident's note.\  

## 2020-08-03 ENCOUNTER — Telehealth: Payer: Self-pay

## 2020-08-03 NOTE — Telephone Encounter (Signed)
Pt would like DULoxetine (CYMBALTA) 60 MG capsule to be increase. Please call pt back.

## 2020-08-03 NOTE — Telephone Encounter (Signed)
Sounds appropriate for an OUD new patient visit when next available. Thanks.

## 2020-08-03 NOTE — Telephone Encounter (Signed)
Patient scheduled tomorrow with Yellow Team (Aslam) for OUD. Kinnie Feil, BSN, RN-BC

## 2020-08-03 NOTE — Telephone Encounter (Signed)
Returned call to patient. States he has been on Duloxetine 60 mg x 11 days and is not having relief from back and leg pain. States only med that has helped relieve pain is subutex. When asked about opioid addiction patient responded, yes.   OUD Intake:  Drug of choice: Roxicet. Last used today  How long: 2-3 years  Other substances: Cocaine. Last used 1 month ago  Currently on suboxone: Ran out 2-3 weeks ago. Was receiving from Provider in [place], [place] where he used to live.  Interested in treatment: Yes  Around users.: No  Family Support: Yes  Will forward to OUD Attending Pool for consideration of OUD appt. Kinnie Feil, BSN, RN-BC

## 2020-08-04 ENCOUNTER — Telehealth: Payer: Self-pay | Admitting: Dietician

## 2020-08-04 ENCOUNTER — Encounter: Payer: Self-pay | Admitting: Internal Medicine

## 2020-08-04 ENCOUNTER — Ambulatory Visit (INDEPENDENT_AMBULATORY_CARE_PROVIDER_SITE_OTHER): Payer: Self-pay | Admitting: Internal Medicine

## 2020-08-04 ENCOUNTER — Other Ambulatory Visit: Payer: Self-pay | Admitting: Student in an Organized Health Care Education/Training Program

## 2020-08-04 VITALS — BP 132/93 | HR 106 | Temp 98.4°F | Ht 66.0 in | Wt 159.4 lb

## 2020-08-04 DIAGNOSIS — M79604 Pain in right leg: Secondary | ICD-10-CM

## 2020-08-04 DIAGNOSIS — F1190 Opioid use, unspecified, uncomplicated: Secondary | ICD-10-CM

## 2020-08-04 DIAGNOSIS — F1120 Opioid dependence, uncomplicated: Secondary | ICD-10-CM

## 2020-08-04 DIAGNOSIS — M549 Dorsalgia, unspecified: Secondary | ICD-10-CM

## 2020-08-04 DIAGNOSIS — M79605 Pain in left leg: Secondary | ICD-10-CM

## 2020-08-04 MED ORDER — BUPRENORPHINE HCL-NALOXONE HCL 8-2 MG SL SUBL
1.0000 | SUBLINGUAL_TABLET | Freq: Every day | SUBLINGUAL | 0 refills | Status: DC
Start: 2020-08-04 — End: 2020-08-04

## 2020-08-04 MED FILL — BUPRENORPHIN-NALOXON 8-2 MG: 8-2 | 14 days supply | Qty: 14 | Fill #0

## 2020-08-04 NOTE — Progress Notes (Signed)
   CC: chronic pain/substance use   HPI:  [name] is a 29 y.o. male with PMHx as listed below presenting for evaluation of his chronic pain and substance use. Patient endorses history of substance use over the past several years. He endorses using cocaine about one month ago and roxicet which he bought off the street. Last Roxicet use was on 10/4. Patient notes that he has been taking suboxone for a while to help with his opioid addiction and chronic pain. He notes that he was previously getting this from [place], [place].    Past Medical History:  Diagnosis Date  . Asthma   . DKA (diabetic ketoacidoses) 06/2017; 10/17/2017  . DKA (diabetic ketoacidoses) 02/2018  . DKA (diabetic ketoacidoses) 12/2018  . Hyperglycemia 09/24/2017  . Obesity   . Type II diabetes mellitus (HCC)    "dx'd 06/2017"   Review of Systems:  Negative except as stated in HPI.  Physical Exam:  Vitals:   08/04/20 1451 08/04/20 1458  BP:  (!) 132/93  Pulse:  (!) 106  Temp:  98.4 F (36.9 C)  TempSrc:  Oral  SpO2:  100%  Weight: 159 lb 6.4 oz (72.3 kg) 159 lb 6.4 oz (72.3 kg)  Height: 5\' 6"  (1.676 m)    Physical Exam  Constitutional: Appears well-developed and well-nourished. No distress.  HENT: Normocephalic and atraumatic, EOMI, conjunctiva normal, moist mucous membranes Cardiovascular: Normal rate, regular rhythm, S1 and S2 present, no murmurs, rubs, gallops.  Distal pulses intact Respiratory: No respiratory distress, no accessory muscle use.  Effort is normal.  Lungs are clear to auscultation bilaterally. GI: Nondistended, soft, nontender to palpation, normal active bowel sounds Musculoskeletal: Normal bulk and tone.  Tenderness to palpation along lower back muscles bilaterally  Neurological: Is alert and oriented x4, no apparent focal deficits noted. Skin: Warm and dry.  No rash, erythema, lesions noted. Psychiatric: Normal mood and affect. Behavior is normal. Judgment and thought content  normal.    Assessment & Plan:   See Encounters Tab for problem based charting.  Patient discussed with Dr. 

## 2020-08-04 NOTE — Patient Instructions (Addendum)
[name],  It was a pleasure seeing you in clinic. Today we discussed:   Opiate use: I have sent a prescription for suboxone to the [facility] pharmacy. Please f/u in 1 week.   If you have any questions or concerns, please call our clinic at [phone] between 9am-5pm and after hours call [phone] and ask for the internal medicine resident on call. If you feel you are having a medical emergency please call 911.   Thank you, we look forward to helping you remain healthy!  If you have not already done so, please get your COVID 19 vaccine.  To schedule an appointment for a COVID vaccine or be added to the vaccine wait list: Go to [email or web address]   OR Go to [email or web address]                  OR Call [phone]                                     OR Call [phone] and select Option 2

## 2020-08-04 NOTE — Telephone Encounter (Signed)
The [facility] pharmacy was asking about the suboxone order but got clarification form Dr. Oswaldo Done.

## 2020-08-05 DIAGNOSIS — F1190 Opioid use, unspecified, uncomplicated: Secondary | ICD-10-CM | POA: Insufficient documentation

## 2020-08-05 DIAGNOSIS — F1111 Opioid abuse, in remission: Secondary | ICD-10-CM | POA: Insufficient documentation

## 2020-08-05 DIAGNOSIS — F1110 Opioid abuse, uncomplicated: Secondary | ICD-10-CM | POA: Insufficient documentation

## 2020-08-05 NOTE — Progress Notes (Signed)
Internal Medicine Clinic Attending ° °Case discussed with Dr. Aslam  At the time of the visit.  We reviewed the resident’s history and exam and pertinent patient test results.  I agree with the assessment, diagnosis, and plan of care documented in the resident’s note.  °

## 2020-08-05 NOTE — Assessment & Plan Note (Addendum)
Patient endorses history of cocaine use with last use approximately one month prior. He endorses chronic lower back pain and bilateral leg pain. He notes that he was previously taking Roxicet and endorsed getting some off the streets. Last use of this was on 10/4. Patient also endorses that he was previously getting suboxone at a clinic in [place], [place]. However, has recently moved to [facility] and has ran out of his medication. PDMP review without recent suboxone prescription.  Patient notes that he had to buy suboxone off the street yesterday and took half a pill as he felt worsening pain, irritability and diaphoresis. He endorses that he is unable to perform his daily tasks without the suboxone. Previously, he notes that suboxone tablet 8-2mg  has controlled his pain and his cravings.   Plan: Suboxone 8-2mg  tablet daily for 2 weeks  Utox ordered at this visit  Follow up in 2 weeks

## 2020-08-09 LAB — TOXASSURE SELECT,+ANTIDEPR,UR

## 2020-08-11 ENCOUNTER — Ambulatory Visit: Payer: Self-pay

## 2020-08-16 ENCOUNTER — Other Ambulatory Visit: Payer: Self-pay

## 2020-08-16 ENCOUNTER — Other Ambulatory Visit: Payer: Self-pay | Admitting: Internal Medicine

## 2020-08-16 DIAGNOSIS — E1110 Type 2 diabetes mellitus with ketoacidosis without coma: Secondary | ICD-10-CM

## 2020-08-16 MED ORDER — INSULIN DETEMIR 100 UNIT/ML FLEXPEN: 20.0000 [IU] | PEN_INJECTOR | Freq: Every day | SUBCUTANEOUS | 11 refills | Status: DC

## 2020-08-16 NOTE — Telephone Encounter (Signed)
insulin detemir (LEVEMIR) 100 unit/ml SOLN, and Suboxone to be filled @  [facility] - [place], [place] - [address]. Phone:  [phone]

## 2020-08-18 ENCOUNTER — Other Ambulatory Visit: Payer: Self-pay | Admitting: Student in an Organized Health Care Education/Training Program

## 2020-08-18 ENCOUNTER — Ambulatory Visit (INDEPENDENT_AMBULATORY_CARE_PROVIDER_SITE_OTHER): Payer: Self-pay | Admitting: Internal Medicine

## 2020-08-18 ENCOUNTER — Ambulatory Visit: Payer: Self-pay

## 2020-08-18 VITALS — BP 131/74 | HR 99 | Temp 98.0°F | Ht 66.0 in | Wt 164.5 lb

## 2020-08-18 DIAGNOSIS — F1110 Opioid abuse, uncomplicated: Secondary | ICD-10-CM

## 2020-08-18 DIAGNOSIS — F1190 Opioid use, unspecified, uncomplicated: Secondary | ICD-10-CM

## 2020-08-18 DIAGNOSIS — D649 Anemia, unspecified: Secondary | ICD-10-CM

## 2020-08-18 DIAGNOSIS — D751 Secondary polycythemia: Secondary | ICD-10-CM

## 2020-08-18 DIAGNOSIS — E1110 Type 2 diabetes mellitus with ketoacidosis without coma: Secondary | ICD-10-CM

## 2020-08-18 MED ORDER — BUPRENORPHINE HCL-NALOXONE HCL 8-2 MG SL SUBL
1.0000 | SUBLINGUAL_TABLET | Freq: Every day | SUBLINGUAL | 1 refills | Status: DC
Start: 2020-08-18 — End: 2020-09-06

## 2020-08-18 MED ORDER — METFORMIN HCL ER 500 MG PO TB24: 500.0000 mg | ORAL_TABLET | Freq: Every day | ORAL | 3 refills | Status: DC

## 2020-08-18 MED FILL — BUPRENORPHIN-NALOXON 8-2 MG: 8-2 | 14 days supply | Qty: 14 | Fill #0

## 2020-08-18 MED FILL — METFORMIN HCL ER 500 MG TB2: 500 | 30 days supply | Qty: 30 | Fill #0

## 2020-08-18 NOTE — Telephone Encounter (Signed)
Patient still needs refill on suboxone. Kinnie Feil, BSN, RN-BC

## 2020-08-18 NOTE — Patient Instructions (Addendum)
[name],  It was a pleasure seeing you in clinic. Today we discussed:   Diabetes: Continue your current regimen of Metformin XR 500mg  daily with Levemir 20U daily. Continue monitoring your blood glucose at home and bring glucose meter to your next appointment.  Opioid use: 4 week prescription for suboxone sent to the IM pharmacy     If you have any questions or concerns, please call our clinic at [phone] between 9am-5pm and after hours call [phone] and ask for the internal medicine resident on call. If you feel you are having a medical emergency please call 911.   Thank you, we look forward to helping you remain healthy!  If you have not already done so, I recommend getting the COVID-19 vaccine.  To schedule an appointment for a COVID vaccine or be added to the vaccine wait list: Go to [phone]   OR Go to [email or web address]                  OR Call [phone]                                     OR Call [phone] and select Option 2

## 2020-08-18 NOTE — Telephone Encounter (Signed)
Pt will need in person appointment for suboxone refill.

## 2020-08-18 NOTE — Progress Notes (Signed)
   CC: diabetes, back pain   HPI:  [name] is a 29 y.o. male with PMHx as listed below presenting for follow up of his diabetes and back pain. Please see problem based charting for complete assessment and plan.  Past Medical History:  Diagnosis Date  . Asthma   . DKA (diabetic ketoacidoses) 06/2017; 10/17/2017  . DKA (diabetic ketoacidoses) 02/2018  . DKA (diabetic ketoacidoses) 12/2018  . Hyperglycemia 09/24/2017  . Obesity   . Type II diabetes mellitus (HCC)    "dx'd 06/2017"   Review of Systems:  Negative except as stated in HPI.  Physical Exam:  Vitals:   08/18/20 1440  Weight: 164 lb 8 oz (74.6 kg)  Height: 5\' 6"  (1.676 m)   Physical Exam  Constitutional: Appears well-developed and well-nourished. No distress.  HENT: Normocephalic and atraumatic, EOMI, conjunctiva normal, moist mucous membranes Cardiovascular: Normal rate, regular rhythm, S1 and S2 present, no murmurs, rubs, gallops.  Distal pulses intact Respiratory: No respiratory distress, no accessory muscle use.  Effort is normal.  Lungs are clear to auscultation bilaterally. Musculoskeletal: Normal bulk and tone.  No peripheral edema noted. Mild tenderness to palpation of the lumbar paraspinal muscles.  Skin: Warm and dry.  No rash, erythema, lesions noted. Psychiatric: Normal mood and affect. Behavior is normal. Judgment and thought content normal.    Assessment & Plan:   See Encounters Tab for problem based charting.  Patient discussed with Dr. 

## 2020-08-19 DIAGNOSIS — D751 Secondary polycythemia: Secondary | ICD-10-CM | POA: Insufficient documentation

## 2020-08-19 LAB — CBC
Hematocrit: 43.3 % (ref 37.5–51.0)
Hemoglobin: 14.5 g/dL (ref 13.0–17.7)
MCH: 29.6 pg (ref 26.6–33.0)
MCHC: 33.5 g/dL (ref 31.5–35.7)
MCV: 88 fL (ref 79–97)
Platelets: 295 10*3/uL (ref 150–450)
RBC: 4.9 x10E6/uL (ref 4.14–5.80)
RDW: 11.4 % — ABNORMAL LOW (ref 11.6–15.4)
WBC: 7.2 10*3/uL (ref 3.4–10.8)

## 2020-08-19 LAB — MICROALBUMIN / CREATININE URINE RATIO
Creatinine, Urine: 109 mg/dL
Microalb/Creat Ratio: 13 mg/g creat (ref 0–29)
Microalbumin, Urine: 14.6 ug/mL

## 2020-08-19 LAB — HEPATITIS C ANTIBODY: Hep C Virus Ab: 0.2 s/co ratio (ref 0.0–0.9)

## 2020-08-19 NOTE — Assessment & Plan Note (Signed)
Patient is presenting for suboxone refill today. Patient was evaluated on 10/7 initially for this and endorsed taking suboxone for his chronic back pain and bilateral leg pain. He endorsed at risk behaviors to obtain relief from his back pain including cocaine use and buying roxicet and suboxone off the streets. Urine drug screen obtained at this visit that was positive for cocaine, marijuana, oxycodone and buprenorphine. Patient was given 2 week prescription of suboxone 8-2mg  daily.  Patient is following up today. He notes that he takes suboxone daily and it has significantly helped him return to normal function. He is able to work without significant comfort and also endorses return of his appetite. He denies any cravings and notes that he has not had any further cocaine use.   Plan: Suboxone 8-2mg  tablet daily for 4 weeks Utox at this visit Follow up in 4 weeks

## 2020-08-19 NOTE — Assessment & Plan Note (Signed)
Patient noted to have polycythemia on multiple CBC's dating back to 2019. He denies any headaches, vision changes or chronic fatigue. Repeat CBC at this visit noted to have improved Hb to 14.5. Suspect his prior evidence of polycythemia is likely in setting of dehydration during his multiple DKA admissions.   Plan: Continue to monitor

## 2020-08-19 NOTE — Assessment & Plan Note (Addendum)
Patient initially diagnosed with type 2 diabetes mellitus in September 2018 with subsequent multiple hospitalizations for DKA. Patient has been on insulin therapy and metformin. Most recent HbA1c 7.0.  Patient was started on metformin 500mg  daily at previous visit with plans to uptitrate slowly. He notes that he is taking this but is having GI symptoms when taking metformin. Discussed switching to extended release and also advised to take with meals. Patient expresses understanding.  Urine microalbumin/cr ratio is wnl. No evidence of diabetic nephropathy at this time.   Plan: - Metformin XR 500mg  daily, uptitrate as tolerated - Continue levemir 20U daily  - HbA1c in 2 months

## 2020-08-20 NOTE — Progress Notes (Signed)
Internal Medicine Clinic Attending ° °Case discussed with Dr. Aslam  At the time of the visit.  We reviewed the resident’s history and exam and pertinent patient test results.  I agree with the assessment, diagnosis, and plan of care documented in the resident’s note.  °

## 2020-08-24 LAB — TOXASSURE SELECT,+ANTIDEPR,UR

## 2020-09-06 ENCOUNTER — Other Ambulatory Visit: Payer: Self-pay

## 2020-09-06 ENCOUNTER — Other Ambulatory Visit: Payer: Self-pay | Admitting: Internal Medicine

## 2020-09-06 DIAGNOSIS — F1110 Opioid abuse, uncomplicated: Secondary | ICD-10-CM

## 2020-09-06 MED ORDER — BUPRENORPHINE HCL-NALOXONE HCL 8-2 MG SL SUBL: 1.0000 | SUBLINGUAL_TABLET | Freq: Every day | SUBLINGUAL | 0 refills | Status: DC

## 2020-09-06 MED FILL — BUPRENORPHIN-NALOXON 8-2 MG: 8-2 | 14 days supply | Qty: 14 | Fill #0

## 2020-09-06 MED FILL — LEVEMIR FLEXTOUCH 100 UNITS: 100 | 30 days supply | Qty: 6 | Fill #1

## 2020-09-06 NOTE — Telephone Encounter (Signed)
Reviewed notes, PDMP database, and recent Utox. Approved 2 week refill of suboxone 8-2 mg one tablet once daily. Sent to [facility] pharmacy, IM program. Patient has a follow up appointment scheduled with [place] on 11/17.

## 2020-09-06 NOTE — Telephone Encounter (Signed)
insulin detemir (LEVEMIR) 100 UNIT/ML FlexPen,  buprenorphine-naloxone (SUBOXONE) 8-2 mg SUBL SL tablet, refill request @  [facility] - [place], [place] - [address]. Phone:  [phone]  Fax:  [phone]

## 2020-09-06 NOTE — Telephone Encounter (Signed)
Levemir 15 mL with 11 refills sent 08/16/2020. Call placed to [facility] at [facility]. States they have these refills and will get ready for patient now. She will remind patient to call them for future refills on maintenance meds. Forwarding refill request for suboxone to OUD Attendings. Kinnie Feil, BSN, RN-BC

## 2020-09-15 ENCOUNTER — Encounter: Payer: Self-pay | Admitting: Internal Medicine

## 2020-09-15 ENCOUNTER — Ambulatory Visit (INDEPENDENT_AMBULATORY_CARE_PROVIDER_SITE_OTHER): Payer: Self-pay | Admitting: Internal Medicine

## 2020-09-15 ENCOUNTER — Other Ambulatory Visit: Payer: Self-pay | Admitting: Internal Medicine

## 2020-09-15 ENCOUNTER — Ambulatory Visit: Payer: Self-pay

## 2020-09-15 ENCOUNTER — Other Ambulatory Visit: Payer: Self-pay

## 2020-09-15 VITALS — BP 127/80 | HR 80 | Temp 98.1°F | Ht 66.0 in | Wt 168.0 lb

## 2020-09-15 DIAGNOSIS — G629 Polyneuropathy, unspecified: Secondary | ICD-10-CM

## 2020-09-15 DIAGNOSIS — N529 Male erectile dysfunction, unspecified: Secondary | ICD-10-CM

## 2020-09-15 DIAGNOSIS — I10 Essential (primary) hypertension: Secondary | ICD-10-CM

## 2020-09-15 DIAGNOSIS — F1110 Opioid abuse, uncomplicated: Secondary | ICD-10-CM

## 2020-09-15 DIAGNOSIS — E1110 Type 2 diabetes mellitus with ketoacidosis without coma: Secondary | ICD-10-CM

## 2020-09-15 LAB — POCT GLYCOSYLATED HEMOGLOBIN (HGB A1C): Hemoglobin A1C: 6.8 % — AB (ref 4.0–5.6)

## 2020-09-15 LAB — GLUCOSE, CAPILLARY: Glucose-Capillary: 158 mg/dL — ABNORMAL HIGH (ref 70–99)

## 2020-09-15 MED ORDER — SITAGLIPTIN PHOSPHATE 100 MG PO TABS: 100.0000 mg | ORAL_TABLET | Freq: Every day | ORAL | 2 refills | Status: DC

## 2020-09-15 MED ORDER — SILDENAFIL CITRATE 50 MG PO TABS: 50.0000 mg | ORAL_TABLET | ORAL | 0 refills | Status: DC | PRN

## 2020-09-15 MED ORDER — DULOXETINE HCL 60 MG PO CPEP: 60.0000 mg | ORAL_CAPSULE | Freq: Every day | ORAL | 11 refills | Status: DC

## 2020-09-15 MED ORDER — BUPRENORPHINE HCL-NALOXONE HCL 8-2 MG SL SUBL: 1.0000 | SUBLINGUAL_TABLET | Freq: Every day | SUBLINGUAL | 0 refills | Status: DC

## 2020-09-15 MED FILL — JANUVIA 100 MG TABLET: 100 | 30 days supply | Qty: 30 | Fill #0

## 2020-09-15 MED FILL — SILDENAFIL CITRATE 50 MG TA: 50 | 30 days supply | Qty: 20 | Fill #0

## 2020-09-15 MED FILL — DULoxetine HCL 60 MG CPEP: 60 | 30 days supply | Qty: 30 | Fill #0

## 2020-09-15 NOTE — Assessment & Plan Note (Addendum)
[name] states that he has been having worsening polyneuropathy in the past month since he has run out of his duloxetine.  He describes a neuropathy is mainly affecting his lateral and posterior upper thighs, but notes that he occasionally has it in his flank, as well as bilateral upper extremities diffusely.  He describes it as a tingling and biting sensation.  He notes that warm water irritates it and worsens his symptoms.  He has been using some gabapentin that he had leftover from a previous ED visit, and that it does not improve his symptoms at all.  He notes that this neuropathy has been going on for at least 6 to 7 months.  It is not consistently involving his lower extremities or feet, does not regularly involve his hands.  He has no dizziness, headaches, diplopia, blurry vision.  He denies any focal weakness and has not interfered with his physical ability to exercise or work.  It has at this point affected his ability to work just because the pain is so severe that it causes a lot of distress.  Assessment/plan: Predominantly pain, with no significant sensory or motor loss.  Patient does have a 3-year history of diabetes that was not really well controlled up until approximately 1 year ago. His polyneuropathy is not consistent with a typical diabetic large fiber neuropathy as it is not stocking glove pattern.  It is certainly possible patient may have an alternative type of diabetic polyneuropathy though.   Given patient's young age, relatively short history of type 2 diabetes and profound symptoms, I feel it prudent to rule out other causes of neuropathy, including vitamin deficiency, thyroid dysfunction, autoimmune dysfunction.  Patient is almost completed his orange card paperwork.  We will order the labs for the future and I encouraged patient to contact the clinic and make a lab only appointment to have all his blood work once his orange card has been established.  -Duloxetine refilled for  symptomatic control -Vitamin B12, TSH, ANA, ESR, CRP, HIV, RPR, SPEP, UPEP with immunofixation -Once patient has lab work, consider addition of EMG testing -If any central nervous system signs develop, consider MRI brain

## 2020-09-15 NOTE — Assessment & Plan Note (Signed)
[name] feels that Suboxone continues to control his symptoms well and he has not had any cravings or relapse.  At this time, he would like to continue with the Suboxone therapy.  Assessment/plan: Last tox assure was reviewed and appropriate.  We will give a 4-week prescription at this time.  -Suboxone 8-2 mg 1 tab daily, #28 tabs

## 2020-09-15 NOTE — Assessment & Plan Note (Signed)
[name] states that he has been experiencing several months of erectile dysfunction, that include lack of morning erections and difficulty obtaining and maintaining an erection with sexual intercourse.  He has attempted to masturbate as well, and has had difficulty with ejaculation.  He feels that the symptoms have predated the initiation of Suboxone and duloxetine.  At this time, this has significantly affected Roger's life as he finds it quite embarrassing and he is very concerned he will be able to have children in the future.  He notes he is not trying for children at this time, but he would like to have children before the age of 82-35.  He denies any previous episodes of ED prior to the first onset.  He denies any prior work-up for ED.  He denies any headache, diplopia, gynecomastia, testicular size changes, penile skin changes, difficulty with urination, saddle anesthesia. Denies any claudication symptoms in lower extremities.   He notes he did purchase Viagra off the street but it was not effective for him. He is unsure of the dosage.   Assessment/plan: Differential is wide at this time.    Given that patient symptoms predated the initiation of Suboxone and duloxetine, I feel it is less likely that this is a drug-related reaction.  It is possible that his current medications are exacerbating the problem though.    Patient certainly has risk factors for this being a vascular issue, including current tobacco use and history of diabetes.  On examination, he has good pedal pulses and no evidence of claudication based on his history.  Given patient's history of polyneuropathy, there is concern that this is a neurological problem, however the biggest etiology on that differential would be multiple sclerosis, as patient does not have any signs or symptoms consistent with stroke or spinal cord injury.  -In conjunction with polyneuropathy work-up, will check TSH, renal function, blood counts -We  will also add on a testosterone level -Viagra 50 mg, 1 tab before intercourse.  Counseled he can take up to 2 tablets of 1 tab is not adequate. -At next appointment, complete international index of erectile dysfunction survey

## 2020-09-15 NOTE — Progress Notes (Signed)
CC: T2DM, polyneuropathy  HPI:  [name] is a 29 y.o. with a PMHx as listed below who presents to the clinic for T2DM, polyneuropathy.   Please see the Encounters tab for problem-based Assessment & Plan regarding status of patient's acute and chronic conditions.  Past Medical History:  Diagnosis Date  . Asthma   . DKA (diabetic ketoacidoses) 06/2017; 10/17/2017  . DKA (diabetic ketoacidoses) 02/2018  . DKA (diabetic ketoacidoses) 12/2018  . Hyperglycemia 09/24/2017  . Obesity   . Type II diabetes mellitus (HCC)    "dx'd 06/2017"   Review of Systems: Review of Systems  Constitutional: Negative for chills, fever and weight loss.  Eyes: Negative for blurred vision, double vision and photophobia.  Respiratory: Negative for cough, shortness of breath and wheezing.   Cardiovascular: Negative for chest pain, palpitations, claudication and leg swelling.  Gastrointestinal: Negative for abdominal pain, blood in stool, constipation, diarrhea, melena, nausea and vomiting.  Genitourinary: Negative for dysuria, flank pain, frequency, hematuria and urgency.       + erectile dysfunction  Musculoskeletal: Positive for back pain (chronic) and myalgias. Negative for falls and joint pain.  Skin: Positive for itching.  Neurological: Positive for tingling and sensory change. Negative for dizziness, speech change, focal weakness, weakness and headaches.  Endo/Heme/Allergies: Negative for environmental allergies. Does not bruise/bleed easily.   Physical Exam:  Vitals:   09/15/20 1451  BP: 127/80  Pulse: 80  Temp: 98.1 F (36.7 C)  TempSrc: Oral  SpO2: 100%  Weight: 168 lb (76.2 kg)  Height: 5\' 6"  (1.676 m)   Physical Exam Vitals and nursing note reviewed.  Constitutional:      General: He is not in acute distress.    Appearance: He is normal weight. He is not toxic-appearing.  HENT:     Head: Normocephalic and atraumatic.  Eyes:     Extraocular Movements: Extraocular  movements intact.     Conjunctiva/sclera: Conjunctivae normal.     Pupils: Pupils are equal, round, and reactive to light.  Cardiovascular:     Rate and Rhythm: Normal rate and regular rhythm.     Pulses: Normal pulses.          Dorsalis pedis pulses are 2+ on the right side and 2+ on the left side.     Heart sounds: No murmur heard.  No gallop.   Pulmonary:     Effort: Pulmonary effort is normal. No respiratory distress.     Breath sounds: No wheezing, rhonchi or rales.  Abdominal:     General: Bowel sounds are normal. There is no distension.     Palpations: Abdomen is soft.  Musculoskeletal:     Right lower leg: No edema.     Left lower leg: No edema.  Skin:    General: Skin is warm and dry.     Findings: No rash.  Neurological:     General: No focal deficit present.     Mental Status: He is alert and oriented to person, place, and time.     Cranial Nerves: No dysarthria or facial asymmetry.     Motor: No weakness or atrophy.     Gait: Gait normal.  Psychiatric:        Mood and Affect: Mood normal.        Behavior: Behavior normal.        Thought Content: Thought content normal.        Judgment: Judgment normal.    Assessment & Plan:   See Encounters  Tab for problem based charting.  Patient discussed with Dr. Oswaldo Done

## 2020-09-15 NOTE — Patient Instructions (Addendum)
It was nice seeing you today! Thank you for choosing [facility] for your Primary Care.    Today we talked about:   Diabetes:   Let's go ahead and decrease your Levemir to 10 units at bedtime. Check your sugar in the morning when you wake up before you eat and please call us if it above 140.   We will also start a new medication called Sitagliptin. Take 1 tablet daily   Neuropathy:   I sent in Duloxetine.  Once your [facility] card is set up, please call the clinic to set up an lab only visit for some lab work  ED:   I sent in a prescription for Viagra.

## 2020-09-15 NOTE — Assessment & Plan Note (Signed)
Resolved with weight loss.   

## 2020-09-15 NOTE — Assessment & Plan Note (Signed)
Lab Results  Component Value Date   HGBA1C 6.8 (A) 09/15/2020   [name] states that he has been doing well with his insulin, 20 units at bedtime, with which she has had no difficulty.  He denies any episodes of hypoglycemia as far as he knows and he is checking his sugars on a daily basis.  Unfortunately he does not have his glucometer with him today.  He has been having difficulty with Metformin, stating that is caused significant diarrhea that has interfered with his life and comfort.  He tried a lower dose and continued to have symptoms.  Symptoms resolved once he stopped taking the Metformin.  Overall, his long-term goal is to be off insulin and on oral medication only.  Assessment/plan: A1c continues to improve, a testament towards patient's effort into healthier living.  Given his significant side effects with Metformin, will discontinue and try different oral regimen.  Patient has lost a significant amount of weight, and is now no longer wanting to lose any, due to this, I am hesitant to start Victoza or other GLP-1.  Given his propensity towards ketosis, will try to avoid SGLT inhibitors.  We will go ahead and start with a DPP 4 inhibitor.  Given that patient's goal is to get off insulin, will start the weaning process at this time.  -Decrease Levemir dose to 10 units before bedtime -Counseled to please contact the clinic showed his morning fasting sugar be above 140 -Start sitagliptin 100 mg daily -Discontinue metformin -68-month follow-up -BMP pending

## 2020-09-16 LAB — BMP8+ANION GAP
Anion Gap: 14 mmol/L (ref 10.0–18.0)
BUN/Creatinine Ratio: 14 (ref 9–20)
BUN: 8 mg/dL (ref 6–20)
CO2: 25 mmol/L (ref 20–29)
Calcium: 9.3 mg/dL (ref 8.7–10.2)
Chloride: 100 mmol/L (ref 96–106)
Creatinine, Ser: 0.57 mg/dL — ABNORMAL LOW (ref 0.76–1.27)
GFR calc Af Amer: 163 mL/min/{1.73_m2} (ref >59)
GFR calc non Af Amer: 141 mL/min/{1.73_m2} (ref >59)
Glucose: 165 mg/dL — ABNORMAL HIGH (ref 65–99)
Potassium: 4.3 mmol/L (ref 3.5–5.2)
Sodium: 139 mmol/L (ref 134–144)

## 2020-09-17 NOTE — Progress Notes (Signed)
Internal Medicine Clinic Attending  Case discussed with Dr. Huel Cote  At the time of the visit.  We reviewed the resident's history and exam and pertinent patient test results.  I agree with the assessment, diagnosis, and plan of care documented in the resident's note.   Patient is at risk for a secondary hypogonadism due to diabetes, tobacco, and opioid use. In order to work this up, would need to check a morning fasting total testosterone, SHBG, and albumin levels, then calculate the free testosterone using those results. If it is low, would need to confirm that with repeat testing, then test for central or primary hypogonadism. Will need to be careful here, as his self-pay status may make these financially impactful on him.

## 2020-09-17 NOTE — Addendum Note (Signed)
Addended by: Verdene Lennert on: 09/17/2020 12:49 PM   Modules accepted: Orders

## 2020-09-24 MED FILL — BUPRENORPHIN-NALOXON 8-2 MG: 8-2 | 28 days supply | Qty: 28 | Fill #0

## 2020-10-04 ENCOUNTER — Other Ambulatory Visit: Payer: Self-pay | Admitting: Student

## 2020-10-04 DIAGNOSIS — F1110 Opioid abuse, uncomplicated: Secondary | ICD-10-CM

## 2020-10-04 MED FILL — DULoxetine HCL 60 MG CPEP: 60 | 30 days supply | Qty: 30 | Fill #1

## 2020-10-04 NOTE — Telephone Encounter (Signed)
Refill Request   buprenorphine-naloxone (SUBOXONE) 8-2 mg SUBL SL tablet  [facility] - [place], [place] - [address]. (Ph: [phone]

## 2020-10-05 ENCOUNTER — Other Ambulatory Visit: Payer: Self-pay | Admitting: Student in an Organized Health Care Education/Training Program

## 2020-10-05 MED ORDER — BUPRENORPHINE HCL-NALOXONE HCL 8-2 MG SL SUBL: 1.0000 | SUBLINGUAL_TABLET | Freq: Every day | SUBLINGUAL | 0 refills | Status: DC

## 2020-10-18 ENCOUNTER — Telehealth: Payer: Self-pay

## 2020-10-18 MED FILL — DULoxetine HCL 60 MG CPEP: 60 | 30 days supply | Qty: 30 | Fill #1

## 2020-10-18 NOTE — Telephone Encounter (Signed)
Called pharmacy, they have the script from nov and will notify pt when refill is ready

## 2020-10-18 NOTE — Telephone Encounter (Signed)
DULoxetine (CYMBALTA) 60 MG capsule REFILL REQUEST @  [facility] - [place], [place] - [address]. Phone:  [phone]  Fax:  [phone]

## 2020-10-19 ENCOUNTER — Telehealth: Payer: Self-pay

## 2020-10-19 ENCOUNTER — Encounter: Payer: Self-pay | Admitting: Student

## 2020-10-19 NOTE — Telephone Encounter (Signed)
Cymbalta 60 mg sent via ERX on 09/15/20 w/ 11 refills to [facility] outpatient pharmacy. SChaplin, RN,BSN

## 2020-10-19 NOTE — Telephone Encounter (Signed)
Need refill on  DULoxetine (CYMBALTA) 60 MG capsule ;pt contact [phone]   [facility] - [place], [place] - [address].

## 2020-10-20 ENCOUNTER — Encounter: Payer: Self-pay | Admitting: Student

## 2020-10-21 ENCOUNTER — Encounter: Payer: Self-pay | Admitting: Student

## 2020-10-25 ENCOUNTER — Encounter: Payer: Self-pay | Admitting: Internal Medicine

## 2020-11-01 ENCOUNTER — Ambulatory Visit (INDEPENDENT_AMBULATORY_CARE_PROVIDER_SITE_OTHER): Payer: Self-pay | Admitting: Student

## 2020-11-01 ENCOUNTER — Encounter: Payer: Self-pay | Admitting: Student

## 2020-11-01 ENCOUNTER — Other Ambulatory Visit: Payer: Self-pay | Admitting: Student

## 2020-11-01 DIAGNOSIS — E1110 Type 2 diabetes mellitus with ketoacidosis without coma: Secondary | ICD-10-CM

## 2020-11-01 DIAGNOSIS — M5442 Lumbago with sciatica, left side: Secondary | ICD-10-CM

## 2020-11-01 DIAGNOSIS — M5441 Lumbago with sciatica, right side: Secondary | ICD-10-CM

## 2020-11-01 DIAGNOSIS — G8929 Other chronic pain: Secondary | ICD-10-CM

## 2020-11-01 DIAGNOSIS — G629 Polyneuropathy, unspecified: Secondary | ICD-10-CM

## 2020-11-01 MED ORDER — CYCLOBENZAPRINE HCL 5 MG PO TABS: 5.0000 mg | ORAL_TABLET | Freq: Three times a day (TID) | ORAL | 0 refills | Status: DC | PRN

## 2020-11-01 NOTE — Patient Instructions (Addendum)
Thank you, [name] for allowing [place] to provide your care today. Today we discussed your back pain. As before, we cannot do any labs or imaging until you have the orange card. Please make sure to get all the documents in so we can figure out what is causing your pain.     I have ordered the following tests:   Please follow-up as needed  Should you have any questions or concerns please call the internal medicine clinic at [phone].    Roger Ku, MD, MPH [facility]   My Chart Access: [email or web address]   If you have not already done so, please get your COVID 19 vaccine  To schedule an appointment for a COVID vaccine choice any of the following: Go to [email or web address]   Go to [email or web address]                  Call [phone]                                     Call [phone] and select Option 2

## 2020-11-01 NOTE — Progress Notes (Signed)
   CC: Back pain follow up  HPI:  [name] is a 30 y.o. HTN, T2DM, Asthma, and polyneuropathy who presents for a follow up of his back pain. Please see problem based charting for evaluation, assessment and plan.  Past Medical History:  Diagnosis Date  . Asthma   . CAP (community acquired pneumonia) 03/17/2018  . DKA (diabetic ketoacidoses) 06/2017; 10/17/2017  . DKA (diabetic ketoacidoses) 02/2018  . DKA (diabetic ketoacidoses) 12/2018  . HTN (hypertension) 07/25/2017   Lisinopril 40mg  Daily  . Hyperglycemia 09/24/2017  . Obesity   . Type II diabetes mellitus (HCC)    "dx'd 06/2017"   Review of Systems:  Constitutional: No fatigue or weight changes Neck: Negative for pain MSK: Positive for back, thighs and legs pain Extremities: Negative for trauma Neuro: Negative for any loss of sensation or weakness.  Physical Exam: General: Pleasant young man. Mild distress, tearful.  Cardiac: RRR. No murmurs, rubs or gallops. Respiratory: Lungs CTAB. No wheezing or crackles.  MSK: Moderate tenderness to palpation of L-spine and paraspinal muscles.  Abdominal: Soft, symmetric and non tender. Normal BS. Skin: Warm and dry Extremities: Atraumatic. Full ROM. Pulse palpable. Neuro: A&O x 3. Moves all extremities. Strength 5/5 in all extremities. Normal sensation. DTR: 2+ patella and achilles. Psych: Occasionally tearful due to pain.   Vitals:   11/01/20 1551 11/01/20 1552  BP:  98/80  Pulse:  98  Temp:  98.2 F (36.8 C)  TempSrc:  Oral  SpO2:  100%  Weight: 164 lb 12.8 oz (74.8 kg) 164 lb 12.8 oz (74.8 kg)  Height:  5\' 8"  (1.727 m)    Assessment & Plan:   See Encounters Tab for problem based charting.  Patient discussed with Dr. 12/30/20, MD, MPH

## 2020-11-02 ENCOUNTER — Encounter: Payer: Self-pay | Admitting: Student

## 2020-11-02 DIAGNOSIS — M549 Dorsalgia, unspecified: Secondary | ICD-10-CM | POA: Insufficient documentation

## 2020-11-02 NOTE — Assessment & Plan Note (Addendum)
Diabetes well controlled with A1c of 6.8 one month ago. Not on Metformin due to experiencing significant diarrhea while on it. Normal kidney function.  --Continue Januvia 100 mg daily --Continue Levemir 20 units daily
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
## 2020-11-02 NOTE — Assessment & Plan Note (Signed)
Patient continues to have neuropathic pain that is worse in his thighs.  States the duloxetine has not helped significantly.  He also endorses bowel incontinence while asleep 1 month ago and another episode a week ago.  He denied any urinary incontinence, weakness or loss of feelings in his groin.  Patient is aware that we need to his documents in order to get him to orange card so further work-up can be done for his polyneuropathy and back pain.  He was encouraged to continue daily duloxetine for now. --Continue duloxetine 60 mg daily --Pending taxes and bank statements in order to complete application for orange card --Multiple work-up labs pending until approval of orange card

## 2020-11-03 NOTE — Progress Notes (Signed)
Internal Medicine Clinic Attending  Case discussed with Dr. Kirke Corin  At the time of the visit.  We reviewed the resident's history and exam and pertinent patient test results.  I agree with the assessment, diagnosis, and plan of care documented in the resident's note.  We discussed the challenge of evaluating and managing this back pain appropriately in the absence of existing financial resources to do so; also with challenge of sufficiently managing the pain in setting of a history of substance use in a man who is working hard to refrain from use of potentially addictive medications.  The Flexeril is thus designed to be short-term.  Ideally, this gentleman would have access to imaging, and to PT if no contraindications.

## 2020-11-04 MED FILL — CYCLOBENZAPRINE HCL 5 MG TA: 5 | 5 days supply | Qty: 15 | Fill #0

## 2020-11-17 ENCOUNTER — Other Ambulatory Visit: Payer: Self-pay | Admitting: Student

## 2020-11-17 ENCOUNTER — Other Ambulatory Visit: Payer: Self-pay | Admitting: Student in an Organized Health Care Education/Training Program

## 2020-11-17 DIAGNOSIS — F1110 Opioid abuse, uncomplicated: Secondary | ICD-10-CM

## 2020-11-17 MED FILL — BUPRENORPHIN-NALOXON 8-2 MG: 8-2 | 28 days supply | Qty: 28 | Fill #0

## 2020-11-17 MED FILL — DULoxetine HCL 60 MG CPEP: 60 | 30 days supply | Qty: 30 | Fill #2

## 2020-11-17 NOTE — Telephone Encounter (Signed)
buprenorphine-naloxone (SUBOXONE) 8-2 mg SUBL SL tablet, refill request @  [facility] - [place], [place] - [address] Phone:  [phone]  Fax:  [phone]      

## 2020-11-18 ENCOUNTER — Ambulatory Visit: Payer: Self-pay

## 2020-11-18 ENCOUNTER — Other Ambulatory Visit: Payer: Self-pay | Admitting: Student

## 2020-11-18 MED ORDER — BUPRENORPHINE HCL-NALOXONE HCL 8-2 MG SL SUBL: 1.0000 | SUBLINGUAL_TABLET | Freq: Every day | SUBLINGUAL | 0 refills | Status: DC

## 2020-11-18 MED FILL — CYCLOBENZAPRINE HCL 5 MG TA: 5 | 5 days supply | Qty: 15 | Fill #0

## 2020-11-22 NOTE — Telephone Encounter (Signed)
Appointment made for 01/04/2021 at 10:15 am and letter mailed to patient.

## 2020-12-15 ENCOUNTER — Other Ambulatory Visit: Payer: Self-pay | Admitting: Internal Medicine

## 2020-12-15 ENCOUNTER — Other Ambulatory Visit: Payer: Self-pay | Admitting: Student in an Organized Health Care Education/Training Program

## 2020-12-15 DIAGNOSIS — F1110 Opioid abuse, uncomplicated: Secondary | ICD-10-CM

## 2020-12-15 DIAGNOSIS — N529 Male erectile dysfunction, unspecified: Secondary | ICD-10-CM

## 2020-12-16 ENCOUNTER — Other Ambulatory Visit: Payer: Self-pay | Admitting: Internal Medicine

## 2020-12-16 ENCOUNTER — Other Ambulatory Visit: Payer: Self-pay | Admitting: Student in an Organized Health Care Education/Training Program

## 2020-12-16 DIAGNOSIS — F1110 Opioid abuse, uncomplicated: Secondary | ICD-10-CM

## 2020-12-16 MED ORDER — SILDENAFIL CITRATE 50 MG PO TABS: 50.0000 mg | ORAL_TABLET | ORAL | 0 refills | Status: DC | PRN

## 2020-12-16 MED FILL — SILDENAFIL CITRATE 50 MG TA: 50 | 30 days supply | Qty: 20 | Fill #0

## 2020-12-17 ENCOUNTER — Other Ambulatory Visit: Payer: Self-pay | Admitting: Student in an Organized Health Care Education/Training Program

## 2020-12-17 MED ORDER — BUPRENORPHINE HCL-NALOXONE HCL 8-2 MG SL SUBL: 1.0000 | SUBLINGUAL_TABLET | Freq: Every day | SUBLINGUAL | 0 refills | Status: DC

## 2020-12-17 MED FILL — BUPRENORPHIN-NALOXON 8-2 MG: 8-2 | 28 days supply | Qty: 28 | Fill #0

## 2020-12-20 MED FILL — DULoxetine HCL 60 MG CPEP: 60 | 30 days supply | Qty: 30 | Fill #3

## 2021-01-04 ENCOUNTER — Telehealth: Payer: Self-pay | Admitting: *Deleted

## 2021-01-04 NOTE — Telephone Encounter (Signed)
Call placed to patient for today's missed appt. R/s to 01/18/2021 at 0915 (first available). Patient states he meant to call to r/s as he had to work today.

## 2021-01-18 ENCOUNTER — Ambulatory Visit (INDEPENDENT_AMBULATORY_CARE_PROVIDER_SITE_OTHER): Payer: Self-pay | Admitting: Internal Medicine

## 2021-01-18 ENCOUNTER — Other Ambulatory Visit: Payer: Self-pay | Admitting: Internal Medicine

## 2021-01-18 ENCOUNTER — Other Ambulatory Visit: Payer: Self-pay

## 2021-01-18 VITALS — BP 132/87 | HR 96 | Temp 97.9°F | Wt 160.2 lb

## 2021-01-18 DIAGNOSIS — F1110 Opioid abuse, uncomplicated: Secondary | ICD-10-CM

## 2021-01-18 DIAGNOSIS — R197 Diarrhea, unspecified: Secondary | ICD-10-CM | POA: Insufficient documentation

## 2021-01-18 DIAGNOSIS — G629 Polyneuropathy, unspecified: Secondary | ICD-10-CM

## 2021-01-18 MED ORDER — DULOXETINE HCL 60 MG PO CPEP: 60.0000 mg | ORAL_CAPSULE | Freq: Every day | ORAL | 11 refills | Status: DC

## 2021-01-18 MED FILL — DULoxetine HCL 60 MG CPEP: 60 | 30 days supply | Qty: 30 | Fill #0

## 2021-01-18 NOTE — Assessment & Plan Note (Addendum)
Patient presents to the OUD clinic with complaints of diarrhea, with intermittent constipation, which has been occurring now for several months. Patient states that he has several loose, watery stools a day, and for the past week has been having BMs at night before he wakes up. He has noted that his stool has changed from brown to a "light color." He has not noticed blood in the stool.  He also has noticed that his stomach has been "grumbling" more and when he eat he typically has 15-20 minutes before having a BM. He denies abdominal pain during these episodes. He denies ETOH or drug usage. He states that he may be experiencing these symptoms because he drinks a lot of zero sugar crystal light packets with water, hawaiian punch, or other fruit drinks.   He denies a FH of inflammatory bowel disease or cancer.   On ROS: denies fevers, myalgias, chest pain, abdominal pain, or hematochezia.  A/P:  Patient presents to the clinic with watery diarrhea, and intermittent constipation, presenting with chronic diarrhea, given his nightly episodes there is concern that his symptoms may be contributed to secretary causes, but his stools are turning a lighter color which is concerning for fatty diarrhea. Patient has not made any changes in his diet, has not been drinking ETOH, melena/hematochezia, or noticed fevers.  Would like to initiate workup for chronic diarrhea with a fecal fat, lactoferrin, and O+P, given the patient presentation to R/O secretory v fatty v inflammatory causes, but patient is currently self pay. 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 We discussed getting his documents notarized and to have a visit ASAP. We will rule out infectious etiology with stool culture today, if negative, we could start imodium.  - Stool Culture - Complete paperwork for evaluation of orange card.  - Address symptoms at next PCP visit.  - When/if orange card  applied for consider stool fat,lactoferritin, O+P (which additionally checks for C. Diff)

## 2021-01-18 NOTE — Patient Instructions (Addendum)
To Mr. Bembenek,  It was a pleasure seeing you today. Today we talked about your suboxone and your diarrhea. For your suboxone, we will continue you on your current dose. For your diarrhea, we will provide you with a cup for you to provide stool which we will culture to ensure there is no gut infection occurring. We will have you follow up with [place] in 4 weeks.  Dolan Amen, MD  Instruction for starting buprenorphine-naloxone (Suboxone) at home  You should not mix buprenorphine-naloxone with other drugs especially large amounts of alcohol or benzodiazepines (Valium, Klonopin, Xanax, Ativan). If you have taken any of these medication, please tell your healthcare team and do not take buprenorphine-naloxone.   You must wait until you are feeling signs of withdrawal from opiates (heroin, pain pills) before you take buprenorphine-naloxone.  If you do not wait long enough the medication will make you sicker.  If you do take it too soon and get sicker then wait until later when you feel signs of withdrawal listed below and then try again.   Signs that you are withdrawing: ? Anxiety, restlessness, cant sit still ? Aches ? Nausea or sick to your stomach ? Goose-bumps ? Racing heart   You should have ALL of these symptoms before you start taking your first dose of buprenorphine-naloxone. If you are not sure call your healthcare team.    When its time to take your first dose 1. Split your pill or film in half 2. Make sure your mouth is empty of everything (no candy/gum/etc) 3. Sit or stand, but do not lie down 4. Swallow a sip of water to wet your mouth  5. Put the half of the tablet or film under your tongue. Do not suck or swallow it. It must stay there until it is completely dissolved. Try to not even swallow your spit during this time. Anything that you swallow will not make you feel better.   In 20 minutes: You should start feeling a little better. If you feel worse then you started too early  so you would wait a few hours and then try again later.    In one hour: You can take the other half of the pill or film the same way you took the first one.   In 2 hours: if you are still feeling symptoms of withdrawal listed above you can take another half a pill or film. You can repeat this if needed until you take a total of 2 pills or 2 films (16mg ). You may need less than this to control your symptoms.  You should adjust your dose so that you are taking one and half or two pills or films per day (12-16mg  per day). At this dose you should have cut down on cravings and help with any withdrawal symptoms.   The next day:  In the morning you can take the same amount you took yesterday all at one time in the morning.  Expect a call from your team to see how you are doing.   If you have any questions or concerns at any time call your healthcare team.  Clinic Number:  830am - 5pm: [phone] After Hours Number: [phone] - - Leave your number and expect a call back from a physician.

## 2021-01-18 NOTE — Progress Notes (Signed)
   01/18/2021  [name] presents for follow up of opioid use disorder I have reviewed the prior induction visit, follow up visits, and telephone encounters relevant to opiate use disorder (OUD) treatment.   Current daily dose: Suboxone 8-2 mg once daily   Date of Induction: Appears to have started induction in clinic on 08/04/20   Current follow up interval, in weeks: 4 weeks  The patient has been adherent with the buprenorphine for OUD contract.   Last UDS Result: 08/18/20 Appropriate with the exception to [facility].   HPI:  Patient arrives for follow up with the OUD clinic. He states that taking his Suboxone 8-2 mg once daily has really helped him. He does not have any cravings at this time, he has not had a relapse episode since he was last seen by either [facility] or OUD. 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   Exam:   Vitals:   01/18/21 0928  BP: 132/87  Pulse: 96  Temp: 97.9 F (36.6 C)  TempSrc: Oral  SpO2: 100%  Weight: 160 lb 3.2 oz (72.7 kg)    Physical Exam Constitutional:      Appearance: Normal appearance. He is normal weight.  HENT:     Head: Normocephalic and atraumatic.  Cardiovascular:     Rate and Rhythm: Normal rate and regular rhythm.     Pulses: Normal pulses.  Abdominal:     General: Abdomen is flat. Bowel sounds are normal.     Palpations: Abdomen is soft.  Neurological:     Mental Status: He is alert and oriented to person, place, and time.  Psychiatric:        Mood and Affect: Mood normal.        Behavior: Behavior normal.     Assessment/Plan:  See Problem Based Charting in the Encounters Tab  Dolan Amen, MD  01/18/2021  10:14 AM

## 2021-01-18 NOTE — Assessment & Plan Note (Signed)
Patient is doing well on his daily Suboxone regimen. He has made many life improvements and has not relapsed or had cravings. He also notes that his current regimen helps alleviate his pain. PDMP reviewed and appropriate. Will need to continue to follow up with OUD clinic for 4 week intervals.  - Refill Suboxone 8-2 mg daily  - Follow up in person in 4 weeks

## 2021-01-21 MED FILL — BUPRENORPHIN-NALOXON 8-2 MG: 8-2 | 28 days supply | Qty: 28 | Fill #0

## 2021-01-25 ENCOUNTER — Telehealth: Payer: Self-pay

## 2021-01-25 LAB — TOXASSURE SELECT,+ANTIDEPR,UR

## 2021-01-25 NOTE — Telephone Encounter (Signed)
Returned call to patient. States he thought he saw a message on MyChart that talked about taking away his suboxone. Unable to find this message. He thanked me and hung up.

## 2021-01-25 NOTE — Progress Notes (Signed)
Internal Medicine Clinic Attending ? ?Case discussed with Dr. Winters  At the time of the visit.  We reviewed the resident?s history and exam and pertinent patient test results.  I agree with the assessment, diagnosis, and plan of care documented in the resident?s note.  ?

## 2021-01-25 NOTE — Telephone Encounter (Signed)
Requesting to speak with a nurse. Please call pt back.  

## 2021-01-29 ENCOUNTER — Other Ambulatory Visit (HOSPITAL_COMMUNITY): Payer: Self-pay

## 2021-02-15 ENCOUNTER — Other Ambulatory Visit (HOSPITAL_COMMUNITY): Payer: Self-pay

## 2021-02-15 ENCOUNTER — Other Ambulatory Visit: Payer: Self-pay

## 2021-02-15 ENCOUNTER — Ambulatory Visit (INDEPENDENT_AMBULATORY_CARE_PROVIDER_SITE_OTHER): Payer: Self-pay | Admitting: Internal Medicine

## 2021-02-15 VITALS — BP 127/81 | HR 89 | Temp 97.9°F | Ht 66.0 in | Wt 155.1 lb

## 2021-02-15 DIAGNOSIS — E1110 Type 2 diabetes mellitus with ketoacidosis without coma: Secondary | ICD-10-CM

## 2021-02-15 DIAGNOSIS — L84 Corns and callosities: Secondary | ICD-10-CM

## 2021-02-15 DIAGNOSIS — F1110 Opioid abuse, uncomplicated: Secondary | ICD-10-CM

## 2021-02-15 LAB — GLUCOSE, CAPILLARY: Glucose-Capillary: 357 mg/dL — ABNORMAL HIGH (ref 70–99)

## 2021-02-15 LAB — POCT GLYCOSYLATED HEMOGLOBIN (HGB A1C): Hemoglobin A1C: 13.7 % — AB (ref 4.0–5.6)

## 2021-02-15 MED ORDER — BUPRENORPHINE HCL-NALOXONE HCL 8-2 MG SL SUBL
1.0000 | SUBLINGUAL_TABLET | Freq: Every day | SUBLINGUAL | 1 refills | Status: DC
  Filled 2021-02-15 – 2021-02-23 (×2): qty 28, 28d supply, fill #0
  Filled 2021-03-02 – 2021-03-06 (×2): qty 28, 28d supply, fill #1

## 2021-02-15 NOTE — Assessment & Plan Note (Addendum)
Patient presents for reevaluation of diabetes mellitus.  Patient's last A1c was done in November of last year and was 6.3 on Januvia 100 mg daily Levemir 20 units daily.  Patient does admit to some burning sensations in his feet but otherwise denies any true numbness.  On evaluation today the patient was found to have a callus on the left lower foot.  Please see picture on note.   Assessment: Patient living with diabetes mellitus without longstanding history of insulin use presenting with a left plantar callus.     Plan: 1. Will repeat A1c today 2. Counseled the patient regarding foot care including wearing appropriate fitting shoes and socks.  Additionally, I gave the patient a handout regarding foot care at home 3. Refer to podiatry for further evaluation management

## 2021-02-15 NOTE — Progress Notes (Signed)
   02/15/2021  HPI: [name] presents for follow up of opioid use disorder I have reviewed the prior induction visit, follow up visits, and telephone encounters relevant to opiate use disorder (OUD) treatment.   Current daily dose: Suboxone 8-2 mg, once daily  Date of Induction: 08/04/2020  Current follow up interval, in weeks: 4 weeks  The patient has been adherent with the buprenorphine for OUD contract.   Last UDS Result: 01/18/2021 and was appropriate for buprenorphine   Exam:   Vitals:   02/15/21 1027  BP: 127/81  Pulse: 89  Temp: 97.9 F (36.6 C)  TempSrc: Oral  SpO2: 100%  Weight: 155 lb 1.6 oz (70.4 kg)  Height: 5\' 6"  (1.676 m)   Physical Exam Constitutional:      Appearance: Normal appearance.  Cardiovascular:     Rate and Rhythm: Normal rate and regular rhythm.     Pulses: Normal pulses.     Heart sounds: Normal heart sounds.  Pulmonary:     Effort: Pulmonary effort is normal.     Breath sounds: Normal breath sounds.  Musculoskeletal:        General: Tenderness (planter aspect of left foot) present. Normal range of motion.  Skin:    Findings: Lesion (left plantar callus on the left lateral aspect of foot) present.  Neurological:     General: No focal deficit present.     Mental Status: He is alert and oriented to person, place, and time. Mental status is at baseline.  Psychiatric:        Mood and Affect: Mood normal.    Media Information         Document Information  Photos    02/15/2021 11:06  Attached To:  Office Visit on 02/15/21 with 02/17/21, MD   Source Information  Dellia Cloud, MD  [facility]      Assessment/Plan:  See Problem Based Charting in the Encounters Tab   Dellia Cloud, D.O.  Internal Medicine Resident, PGY-2 [facility]  Pager: [phone] 12:22 PM, 02/15/2021

## 2021-02-15 NOTE — Patient Instructions (Addendum)
Thank you, [name] for allowing [place] to provide your care today. Today we discussed OUD.    I have ordered the following labs for you:   Lab Orders     POC Hbg A1C   Tests ordered today:  none  Referrals ordered today:    Referral Orders     Ambulatory referral to Podiatry   Medication Changes:   Medications Discontinued During This Encounter  Medication Reason  . buprenorphine-naloxone (SUBOXONE) 8-2 mg SUBL SL tablet Reorder     Meds ordered this encounter  Medications  . buprenorphine-naloxone (SUBOXONE) 8-2 mg SUBL SL tablet    Sig: Place 1 tablet under the tongue daily.    Dispense:  28 tablet    Refill:  1    NADEAN:[id number] IM Program     Instructions:  - I put in a referral for a podiatrist.  - Please follow up with your employer at full time job openings and signing up for health insurance.   Follow up: 1 month   Remember:    Should you have any questions or concerns please call the internal medicine clinic at [phone].     [name], D.O. [facility]   Corns and Calluses Corns are small areas of thickened skin that form on the top, sides, or tip of a toe. Corns have a cone-shaped core with a point that can press on a nerve below. This causes pain. Calluses are areas of thickened skin that can form anywhere on the body, including the hands, fingers, palms, soles of the feet, and heels. Calluses are usually larger than corns. What are the causes? Corns and calluses are caused by rubbing (friction) or pressure, such as from shoes that are too tight or do not fit properly. What increases the risk? Corns are more likely to develop in people who have misshapen toes (toe deformities), such as hammer toes. Calluses can form with friction to any area of the skin. They are more likely to develop in people who:  Work with their hands.  Wear shoes that fit poorly, are too tight, or are high-heeled.  Have toe  deformities. What are the signs or symptoms? Symptoms of a corn or callus include:  A hard growth on the skin.  Pain or tenderness under the skin.  Redness and swelling.  Increased discomfort while wearing tight-fitting shoes, if your feet are affected. If a corn or callus becomes infected, symptoms may include:  Redness and swelling that gets worse.  Pain.  Fluid, blood, or pus draining from the corn or callus.   How is this diagnosed? Corns and calluses may be diagnosed based on your symptoms, your medical history, and a physical exam. How is this treated? Treatment for corns and calluses may include:  Removing the cause of the friction or pressure. This may involve: ? Changing your shoes. ? Wearing shoe inserts (orthotics) or other protective layers in your shoes, such as a corn pad. ? Wearing gloves.  Applying medicine to the skin (topical medicine) to help soften skin in the hardened, thickened areas.  Removing layers of dead skin with a file to reduce the size of the corn or callus.  Removing the corn or callus with a scalpel or laser.  Taking antibiotic medicines, if your corn or callus is infected.  Having surgery, if a toe deformity is the cause. Follow these instructions at home:  Take over-the-counter and prescription medicines only as told by your health care provider.  If you were prescribed an antibiotic medicine, take it as told by your health care provider. Do not stop taking it even if your condition improves.  Wear shoes that fit well. Avoid wearing high-heeled shoes and shoes that are too tight or too loose.  Wear any padding, protective layers, gloves, or orthotics as told by your health care provider.  Soak your hands or feet. Then use a file or pumice stone to soften your corn or callus. Do this as told by your health care provider.  Check your corn or callus every day for signs of infection.   Contact a health care provider if:  Your symptoms  do not improve with treatment.  You have redness or swelling that gets worse.  Your corn or callus becomes painful.  You have fluid, blood, or pus coming from your corn or callus.  You have new symptoms. Get help right away if:  You develop severe pain with redness. Summary  Corns are small areas of thickened skin that form on the top, sides, or tip of a toe. These can be painful.  Calluses are areas of thickened skin that can form anywhere on the body, including the hands, fingers, palms, and soles of the feet. Calluses are usually larger than corns.  Corns and calluses are caused by rubbing (friction) or pressure, such as from shoes that are too tight or do not fit properly.  Treatment may include wearing padding, protective layers, gloves, or orthotics as told by your health care provider. This information is not intended to replace advice given to you by your health care provider. Make sure you discuss any questions you have with your health care provider. Document Revised: 02/12/2020 Document Reviewed: 02/12/2020 Elsevier Patient Education  2021 [employer].

## 2021-02-15 NOTE — Addendum Note (Signed)
Addended by: Chari Manning on: 02/15/2021 01:13 PM   Modules accepted: Orders

## 2021-02-15 NOTE — Assessment & Plan Note (Addendum)
Patient presents for reevaluation of opioid use disorder.  Patient states that he has been doing well on his current dose of Suboxone.  Currently taking Suboxone 8-2 mg daily.  He states that his cravings have been better controlled and he denies any signs of withdrawal.  He denies any relapses either. Recently he has been working on getting his GED and started part-time work.  He is eager to move to more of a full-time position so that he can access benefits such as health insurance.  Last appointment was 01/18/21 Last UDS was on 01/18/21 with buprenorphine and cocaine, negative for duloxetine.  PDMP reviewed. Last written 11/18/20, last filled 01/21/21  Assessment: Living with opioid use disorder in remission  Plan: 1. Continue Suboxone 8-2 mg daily 2. UDS today 3. Follow-up in 1 month

## 2021-02-15 NOTE — Progress Notes (Signed)
Internal Medicine Clinic Attending  Case discussed with Dr. Coe  At the time of the visit.  We reviewed the resident's history and exam and pertinent patient test results.  I agree with the assessment, diagnosis, and plan of care documented in the resident's note.  

## 2021-02-22 LAB — TOXASSURE SELECT,+ANTIDEPR,UR

## 2021-02-23 ENCOUNTER — Other Ambulatory Visit (HOSPITAL_COMMUNITY): Payer: Self-pay

## 2021-02-23 MED FILL — Sildenafil Citrate Tab 50 MG: ORAL | 20 days supply | Qty: 20 | Fill #0 | Status: AC

## 2021-02-23 MED FILL — Duloxetine HCl Enteric Coated Pellets Cap 60 MG (Base Eq): ORAL | 30 days supply | Qty: 30 | Fill #0 | Status: AC

## 2021-02-23 MED FILL — Insulin Detemir Soln Pen-injector 100 Unit/ML: SUBCUTANEOUS | 30 days supply | Qty: 6 | Fill #0 | Status: AC

## 2021-03-01 ENCOUNTER — Ambulatory Visit (HOSPITAL_COMMUNITY)
Admission: EM | Admit: 2021-03-01 | Discharge: 2021-03-01 | Disposition: A | Discharge status: Home / Self Care | Payer: Self-pay | Attending: Student | Admitting: Student

## 2021-03-01 ENCOUNTER — Other Ambulatory Visit: Payer: Self-pay

## 2021-03-01 ENCOUNTER — Encounter (HOSPITAL_COMMUNITY): Payer: Self-pay

## 2021-03-01 DIAGNOSIS — Z794 Long term (current) use of insulin: Secondary | ICD-10-CM

## 2021-03-01 DIAGNOSIS — E11628 Type 2 diabetes mellitus with other skin complications: Secondary | ICD-10-CM

## 2021-03-01 DIAGNOSIS — L03116 Cellulitis of left lower limb: Secondary | ICD-10-CM

## 2021-03-01 MED ORDER — DOXYCYCLINE HYCLATE 100 MG PO CAPS
100.0000 mg | ORAL_CAPSULE | Freq: Two times a day (BID) | ORAL | 0 refills | Status: AC
  Filled 2021-03-01: qty 14, 7d supply, fill #0

## 2021-03-01 NOTE — ED Provider Notes (Signed)
[facility]    CSN: [id number] Arrival date & time: 03/01/21  1939      History   Chief Complaint Chief Complaint  Patient presents with  . Foot Pain    HPI [name] is a 30 y.o. male presenting with left foot pain and skin discoloration for about 1-1/2 years, with new symptoms of erythema and pain.  Medical history diabetes type 2, hyperglycemia, DKA multiple times, community-acquired pneumonia.  Notes 1-1/2 years of darker skin on the left side of his left foot, at the base of his pinky toe.  States for the last week, this has been getting little worse with some erythema and warmth.  Mildly tender.  Denies fever/chills, fatigue, malaise, diaphoresis, drainage. Has not taken antipyretic.  HPI  Past Medical History:  Diagnosis Date  . Asthma   . CAP (community acquired pneumonia) 03/17/2018  . DKA (diabetic ketoacidoses) 06/2017; 10/17/2017  . DKA (diabetic ketoacidoses) 02/2018  . DKA (diabetic ketoacidoses) 12/2018  . HTN (hypertension) 07/25/2017   Lisinopril 40mg  Daily  . Hyperglycemia 09/24/2017  . Obesity   . Type II diabetes mellitus (HCC)    "dx'd 06/2017"    Patient Active Problem List   Diagnosis Date Noted  . Diarrhea 01/18/2021  . Back pain 11/02/2020  . Erectile dysfunction 09/15/2020  . Polycythemia 08/19/2020  . Mild opioid use disorder on maintenance therapy (HCC) 08/05/2020  . Polyneuropathy 07/23/2020  . Hypokalemia 03/18/2018  . Hyperglycemia   . Right bundle branch block (RBBB) determined by electrocardiography 08/24/2017  . Type 2 diabetes mellitus (HCC) 08/01/2017  . Smoking 08/01/2017    Past Surgical History:  Procedure Laterality Date  . ADENOIDECTOMY    . LACERATION REPAIR Left    "stitched finger up"  . TONSILLECTOMY         Home Medications    Prior to Admission medications   Medication Sig Start Date End Date Taking? Authorizing Provider  doxycycline (VIBRAMYCIN) 100 MG capsule Take 1 capsule (100 mg total)  by mouth 2 (two) times daily for 7 days. 03/01/21 03/08/21 Yes 05/08/21, PA-C  buprenorphine-naloxone (SUBOXONE) 8-2 mg SUBL SL tablet Place 1 tablet under the tongue daily. 02/15/21   02/17/21, MD  cyclobenzaprine (FLEXERIL) 5 MG tablet TAKE 1 TABLET BY MOUTH THREE TIMES DAILY AS NEEDED FOR UP TO 5 DAYS FOR MUSCLE SPASMS. 11/18/20 11/18/21  11/20/21, MD  DULoxetine (CYMBALTA) 60 MG capsule TAKE 1 CAPSULE (60 MG TOTAL) BY MOUTH DAILY. 01/18/21 01/18/22  01/20/22, MD  insulin detemir (LEVEMIR) 100 UNIT/ML FlexPen Inject 20 Units into the skin daily. 08/16/20   08/18/20, MD  insulin detemir (LEVEMIR) 100 UNIT/ML FlexPen INJECT 20 UNITS TOTAL INTO THE SKIN DAILY. 07/23/20 07/23/21  07/25/21, DO  Insulin Pen Needle (PEN NEEDLES) 32G X 4 MM MISC 1 each by Does not apply route daily. 07/23/20   07/25/20, DO  sildenafil (VIAGRA) 50 MG tablet TAKE 1 TABLET (50 MG TOTAL) BY MOUTH AS NEEDED 1 HOUR PRIOR TO INTERCOURSE. 12/16/20 12/16/21  12/18/21, MD  sitaGLIPtin (JANUVIA) 100 MG tablet TAKE 1 TABLET (100 MG TOTAL) BY MOUTH DAILY. 09/15/20 09/15/21  09/17/21, MD    Family History History reviewed. No pertinent family history.  Social History Social History   Tobacco Use  . Smoking status: Current Every Day Smoker    Packs/day: 0.10    Years: 12.00    Pack years: 1.20    Types: Cigarettes  .  Smokeless tobacco: Never Used  . Tobacco comment: 0.5 PPD  Vaping Use  . Vaping Use: Never used  Substance Use Topics  . Alcohol use: Yes    Comment: OCCASSIONAL  . Drug use: Yes    Types: Marijuana    Comment: 10/17/2017 ~3 times per week     Allergies   Vicodin [hydrocodone-acetaminophen], Eggs or egg-derived products, and Lactose intolerance (gi)   Review of Systems Review of Systems  Skin:       Foot issue  All other systems reviewed and are negative.    Physical Exam Triage Vital Signs ED Triage Vitals  Enc Vitals Group     BP  03/01/21 1959 131/74     Pulse Rate 03/01/21 1959 95     Resp 03/01/21 1959 17     Temp 03/01/21 1959 98.4 F (36.9 C)     Temp Source 03/01/21 1959 Oral     SpO2 03/01/21 1959 96 %     Weight --      Height --      Head Circumference --      Peak Flow --      Pain Score 03/01/21 1958 8     Pain Loc --      Pain Edu? --      Excl. in GC? --    No data found.  Updated Vital Signs BP 131/74 (BP Location: Right Arm)   Pulse 95   Temp 98.4 F (36.9 C) (Oral)   Resp 17   SpO2 96%   Visual Acuity Right Eye Distance:   Left Eye Distance:   Bilateral Distance:    Right Eye Near:   Left Eye Near:    Bilateral Near:     Physical Exam Vitals reviewed.  Constitutional:      General: He is not in acute distress.    Appearance: Normal appearance. He is not ill-appearing or diaphoretic.  HENT:     Head: Normocephalic and atraumatic.  Cardiovascular:     Rate and Rhythm: Normal rate and regular rhythm.     Heart sounds: Normal heart sounds.  Pulmonary:     Effort: Pulmonary effort is normal.     Breath sounds: Normal breath sounds.  Skin:    General: Skin is warm.     Comments: L foot- lateral aspect at base of small toe- with 1cm area of hyperpigmented skin with faint erythema and warmth. No discharge. Mildly TTP. Area is superficial and mobile.   Neurological:     General: No focal deficit present.     Mental Status: He is alert and oriented to person, place, and time.  Psychiatric:        Mood and Affect: Mood normal.        Behavior: Behavior normal.        Thought Content: Thought content normal.        Judgment: Judgment normal.      UC Treatments / Results  Labs (all labs ordered are listed, but only abnormal results are displayed) Labs Reviewed - No data to display  EKG   Radiology No results found.  Procedures Procedures (including critical care time)  Medications Ordered in UC Medications - No data to display  Initial Impression / Assessment  and Plan / UC Course  I have reviewed the triage vital signs and the nursing notes.  Pertinent labs & imaging results that were available during my care of the patient were reviewed by me and considered in  my medical decision making (see chart for details).     This patient is a 30 year old male presenting with small area of skin discoloration on left foot.  I have some concern that this is turning into a superficial cellulitis given new onset of erythema and mild warmth.  Area is small and quite superficial, without discharge.  Patient with concurrent type 2 diabetes.  Patient has no fevers, chills, tachycardia, malaise; very low suspicion for osteomyelitis.  Plan to treat with doxycycline with strict return precautions.  Wound care instructions provided.  ED return precautions discussed.  Final Clinical Impressions(s) / UC Diagnoses   Final diagnoses:  Cellulitis of left lower extremity  Type 2 diabetes mellitus with other skin complication, with long-term current use of insulin (HCC)     Discharge Instructions     -Doxycycline twice daily for 7 days. -Wash your wound with gentle soap and water 1-2 times daily.  Let air dry or gently pat.  You can pull this with over-the-counter antibiotic ointment and a bandage. -Seek additional immediate medical attention if you develop new symptoms like fever/chills, discharge from the wound, worsening of the skin discoloration and swelling, pain surrounding the area, etc.   ED Prescriptions    Medication Sig Dispense Auth. Provider   doxycycline (VIBRAMYCIN) 100 MG capsule Take 1 capsule (100 mg total) by mouth 2 (two) times daily for 7 days. 14 capsule Rhys Martini, PA-C     PDMP not reviewed this encounter.   Rhys Martini, PA-C 03/01/21 2050

## 2021-03-01 NOTE — Discharge Instructions (Addendum)
-  Doxycycline twice daily for 7 days. -Wash your wound with gentle soap and water 1-2 times daily.  Let air dry or gently pat.  You can pull this with over-the-counter antibiotic ointment and a bandage. -Seek additional immediate medical attention if you develop new symptoms like fever/chills, discharge from the wound, worsening of the skin discoloration and swelling, pain surrounding the area, etc.

## 2021-03-01 NOTE — ED Triage Notes (Signed)
Pt c/o a spot on his foot and states it is turning different colors. Pt states he is diabetic and is concerned. He states his foot hurts and is painful to walk.

## 2021-03-02 ENCOUNTER — Telehealth: Payer: Self-pay

## 2021-03-02 ENCOUNTER — Other Ambulatory Visit (HOSPITAL_COMMUNITY): Payer: Self-pay

## 2021-03-02 NOTE — Telephone Encounter (Signed)
Returned call to patient. States he received a MyChart message that a medication is ready and wanted to know which one it is. Patient was seen yesterday in UC for foot infection and Rx was written for doxycycline. Explained it was most likely this med. He was very Adult nurse.   Also, states he is working on having all docs ready for appt with financial counselor on 03/15/21.

## 2021-03-02 NOTE — Telephone Encounter (Signed)
Pls contact pt regarding medicine [phone]

## 2021-03-03 ENCOUNTER — Ambulatory Visit: Payer: Self-pay

## 2021-03-07 ENCOUNTER — Other Ambulatory Visit (HOSPITAL_COMMUNITY): Payer: Self-pay

## 2021-03-09 ENCOUNTER — Other Ambulatory Visit (HOSPITAL_COMMUNITY): Payer: Self-pay

## 2021-03-10 ENCOUNTER — Telehealth: Payer: Self-pay

## 2021-03-10 NOTE — Telephone Encounter (Signed)
We do not replace lost prescriptions for any reason. Needs to have an appointment for a dose change.

## 2021-03-10 NOTE — Telephone Encounter (Signed)
Pt states he left  buprenorphine-naloxone (SUBOXONE) 8-2 mg SUBL SL tablet in a friend car who live five hours away. Requesting another med and want the dosage to be change. Please call back.

## 2021-03-10 NOTE — Telephone Encounter (Signed)
Returned call to patient. No answer. Left message on VM requesting return call.  °

## 2021-03-11 NOTE — Telephone Encounter (Signed)
Return pt's call - informed "We do not replace lost prescriptions for any reason. Needs to have an appointment for a dose change" per Dr Antony Contras. Offered pt 1015 Am appt in OUD on Tuesday 5/17 but he stated he has a court appt. Does the pt has to have an OUD appt or can he sees one of the residents?

## 2021-03-11 NOTE — Telephone Encounter (Signed)
Pt is calling regarding medicine [phone]

## 2021-03-14 ENCOUNTER — Telehealth: Payer: Self-pay

## 2021-03-14 NOTE — Telephone Encounter (Signed)
error 

## 2021-03-14 NOTE — Telephone Encounter (Signed)
He can see a resident as long as one of the OUD attendings are precepting clinic that day. Thanks!

## 2021-03-14 NOTE — Telephone Encounter (Signed)
Front office called pt to schedule an appt - per pt's request, appt has been scheduled in OUD 5/24 @ 0915 AM.

## 2021-03-15 ENCOUNTER — Ambulatory Visit: Payer: Self-pay

## 2021-03-22 ENCOUNTER — Other Ambulatory Visit (HOSPITAL_COMMUNITY): Payer: Self-pay

## 2021-03-22 ENCOUNTER — Ambulatory Visit (INDEPENDENT_AMBULATORY_CARE_PROVIDER_SITE_OTHER): Payer: Self-pay | Admitting: Internal Medicine

## 2021-03-22 VITALS — BP 134/84 | HR 91 | Temp 98.4°F | Wt 151.9 lb

## 2021-03-22 DIAGNOSIS — F1110 Opioid abuse, uncomplicated: Secondary | ICD-10-CM

## 2021-03-22 DIAGNOSIS — F419 Anxiety disorder, unspecified: Secondary | ICD-10-CM

## 2021-03-22 DIAGNOSIS — G629 Polyneuropathy, unspecified: Secondary | ICD-10-CM

## 2021-03-22 DIAGNOSIS — S91302A Unspecified open wound, left foot, initial encounter: Secondary | ICD-10-CM

## 2021-03-22 DIAGNOSIS — F32A Depression, unspecified: Secondary | ICD-10-CM

## 2021-03-22 DIAGNOSIS — E1110 Type 2 diabetes mellitus with ketoacidosis without coma: Secondary | ICD-10-CM

## 2021-03-22 MED ORDER — SITAGLIPTIN PHOSPHATE 100 MG PO TABS
100.0000 mg | ORAL_TABLET | Freq: Every day | ORAL | 0 refills | Status: DC
  Filled 2021-03-22: qty 30, 30d supply, fill #0

## 2021-03-22 MED ORDER — BUPRENORPHINE HCL-NALOXONE HCL 8-2 MG SL SUBL
1.0000 | SUBLINGUAL_TABLET | Freq: Two times a day (BID) | SUBLINGUAL | 1 refills | Status: DC
  Filled 2021-03-22: qty 28, 14d supply, fill #0
  Filled 2021-04-02: qty 28, 14d supply, fill #1

## 2021-03-22 MED ORDER — DOXYCYCLINE HYCLATE 100 MG PO TABS
100.0000 mg | ORAL_TABLET | Freq: Two times a day (BID) | ORAL | 0 refills | Status: DC
  Filled 2021-03-22: qty 14, 7d supply, fill #0

## 2021-03-22 NOTE — Progress Notes (Signed)
03/22/2021  [name] presents for follow up of opioid use disorder I have reviewed the prior induction visit, follow up visits, and telephone encounters relevant to opiate use disorder (OUD) treatment.   Current daily dose: Suboxone 8-2 mg once daily  Date of Induction: 08/04/2020  Current follow up interval, in weeks: 4 weeks  The patient previously had been adherent with the buprenorphine for OUD contract. He has had difficulty with adherence in the past 2 weeks, as described below.   Last UDS Result: 02/15/2021 and was appropriate for Suboxone, however unexpected results included metabolites of cocaine and THC.   HPI:   [name] states that he has not been doing well over the past several weeks.  Over the last several months, he has been having difficulty with his long-term off and on partner of 8 to 9 years and he subsequently made the decision to break things off.  In addition he has been working on financially helping his mother and sister at times.  Approximately 2 weeks ago, he left his Suboxone prescription in a friend's car who then went out of town.  He states he attempted to call our clinic but was told that a new prescription cannot be sent in.  Since that time, he experienced withdrawal symptoms including diarrhea, diaphoresis, nausea, vomiting.  He is having intense pain all over his body at this time.  Patient also admits that when he did have a Suboxone prescription on him, he was taking it more often than prescribed, up to 2 3 times per day.  [name] states that he is also not been taking his diabetes medication either.  He has his Levemir at home and has restarted it in the last couple days, he also has a Sitagliptin at home but has not started it yet.  He feels that his life stressors have caused him to discontinue his medications.  He is concerned that his uncontrolled diabetes is leading to a lot of his body aches and worsening of his left foot wound.  He  notes his left foot food has been there for a long time after an injury at work.  However in the last few weeks he has noticed increased pain, swelling, and drainage.  He went to the ED on 5/3 and diagnosed with cellulitis and started on a 1 week course of doxycycline.  Since that time, he has been experiencing left foot pain.  Exam:   Vitals:   03/22/21 0919  BP: 134/84  Pulse: 91  Temp: 98.4 F (36.9 C)  TempSrc: Oral  SpO2: 100%  Weight: 151 lb 14.4 oz (68.9 kg)   Physical Exam Vitals and nursing note reviewed.  Constitutional:      General: He is not in acute distress.    Appearance: He is normal weight. He is not toxic-appearing.  HENT:     Head: Normocephalic and atraumatic.     Mouth/Throat:     Mouth: Mucous membranes are moist.     Pharynx: Oropharynx is clear.  Eyes:     Extraocular Movements: Extraocular movements intact.     Conjunctiva/sclera: Conjunctivae normal.  Cardiovascular:     Rate and Rhythm: Normal rate and regular rhythm.     Heart sounds: No murmur heard.   Pulmonary:     Effort: Pulmonary effort is normal. No respiratory distress.     Breath sounds: No wheezing, rhonchi or rales.  Abdominal:     General: Bowel sounds are normal.  Palpations: Abdomen is soft.  Skin:    General: Skin is warm and dry.     Comments: On the plantar aspect of the fifth MTP joint, left leg, there is fluctuance with some surrounding induration.  No erythema or increased warmth.  No edema.  Please see picture below for more details.  Neurological:     General: No focal deficit present.     Mental Status: He is alert and oriented to person, place, and time. Mental status is at baseline.  Psychiatric:        Attention and Perception: Attention normal.        Mood and Affect: Mood normal.        Behavior: Behavior normal.        Thought Content: Thought content normal.        Judgment: Judgment normal.      Assessment/Plan:  See Problem Based Charting in the  Encounters Tab  Verdene Lennert, MD 03/22/2021  9:22 AM

## 2021-03-22 NOTE — Patient Instructions (Addendum)
It was nice seeing you today! Thank you for choosing [facility] for your Primary Care.    Today we talked about:   1. Suboxone Therapy: We increased your dose to twice daily. Make sure to only take it twice daily. If you are having cravings, please reach out to the clinic.   2. Foot Wound: We sent in a prescription for Doxycycline to take for another 7 days.   3. Diabetes: Restart your Levemir and Sitagliptin. Begin taking your sugars in the morning right before you eat anything. Follow up in 1-2 weeks and bring your glucometer with you.   4. I have placed a referral to our psychologist, who will reach out to you.

## 2021-03-22 NOTE — Assessment & Plan Note (Addendum)
Lab Results  Component Value Date   HGBA1C 13.7 (A) 02/15/2021   Patient admits to never starting sitagliptin and has been off his Levemir for an extended period of time.  His A1c has markedly increased from 6.2% to 13.7% in the past 6 months.  We discussed extensively the complications that can arise from diabetes and patient is aware of this.  He is in agreement to go ahead and restart both his medications as he has them available at home.  He has a working glucometer at home and will check his morning fasting sugars.  Of note, when patient was diagnosed in 2018, C-peptide was present within normal limits and GAD antibodies were negative.  - Restart Levemir 20 units daily - Restart Sitagliptin 100 mg - Follow-up visit in 1 to 2 weeks with glucometer for further titration of insulin.

## 2021-03-23 ENCOUNTER — Other Ambulatory Visit (HOSPITAL_COMMUNITY): Payer: Self-pay

## 2021-03-23 NOTE — Assessment & Plan Note (Addendum)
Mr. Schnitker has not taken Suboxone in 2 weeks and experience withdrawal symptoms since then after misplacing his prescription.  Additionally, he notes that when he was taking it daily, he continues to have cravings and pain and subsequently increased the frequency to twice a day and occasionally 3 times a day.  He notes a recent relapse on cocaine approximately 4 to 5 weeks ago that was present on his last UDS.  Since then, he has been working very hard not to relapse and has significant anxiety over " falling back into his old ways."   Assessment/plan: Opioid use disorder within remission, although patient has relapsed on other substances.  We discussed extensively that he can only take the medication as indicated on the bottle even if he feels he needs more frequent dosing.  He expressed understanding  We discussed increasing his Suboxone dose to twice daily and he is in agreement.  - Suboxone 8-2 mg twice daily - 4-week follow-up - UDS at next visit

## 2021-03-23 NOTE — Assessment & Plan Note (Signed)
Patient states that he has been having a lot of difficulty over the last few months due to trouble with his personal relationship.  He recently broke off with his partner who he has been with on and off for 8 to 9 years.  He felt that this partner was a negative influence on him and held him back from moving on in life.  He feels that breaking things off was the right thing to do and will have a positive impact on him on the long-term, however he is still struggling with these changes.  He also notes a lot of difficulty in having to support his mother and sister at times financially although he is struggling financially himself.  At times he does think he be better off dead, however denies any suicidal ideation or plan; stating that his family and his religion prevent him from ever harming himself.  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  He would certainly benefit from therapy although his options are limited due to lack of insurance.  We will place a referral to Dr. to see if he can receive therapy here through our clinic.   -Referral to [facility] placed

## 2021-03-23 NOTE — Assessment & Plan Note (Signed)
On evaluation, patient's wound on the plantar aspect of his left great toe has some fluctuance, however not erythematous or increased warmth.  Given that patient continues to have pain in the area and his high risk for infection, will complete another course of doxycycline.  Recommended that he be reevaluated in the next 1 to 2 weeks and if no improvement consider referral to a podiatrist.  He was counseled that should she develop any worsening pain, fever, chills or any other signs of infection, to please contact the clinic immediately and he expressed understanding.  -Doxycycline 100 mg twice daily x7 days

## 2021-03-23 NOTE — Assessment & Plan Note (Addendum)
Mr. Bubb is currently taking duloxetine for his neuropathy.  He notes that this has helped significantly and he can tell his neuropathy is not well controlled if he misses any doses.  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

## 2021-03-24 NOTE — Progress Notes (Signed)
Internal Medicine Clinic Attending  I saw and evaluated the patient.  I personally confirmed the key portions of the history and exam documented by Dr. Basaraba and I reviewed pertinent patient test results.  The assessment, diagnosis, and plan were formulated together and I agree with the documentation in the resident's note.    

## 2021-03-31 ENCOUNTER — Other Ambulatory Visit (HOSPITAL_COMMUNITY): Payer: Self-pay

## 2021-03-31 ENCOUNTER — Encounter: Payer: Self-pay | Admitting: Student

## 2021-04-02 MED FILL — Duloxetine HCl Enteric Coated Pellets Cap 60 MG (Base Eq): ORAL | 30 days supply | Qty: 30 | Fill #1 | Status: AC

## 2021-04-04 ENCOUNTER — Other Ambulatory Visit (HOSPITAL_COMMUNITY): Payer: Self-pay

## 2021-04-05 ENCOUNTER — Encounter: Payer: Self-pay | Admitting: Student

## 2021-04-05 ENCOUNTER — Other Ambulatory Visit: Payer: Self-pay

## 2021-04-05 ENCOUNTER — Other Ambulatory Visit (HOSPITAL_COMMUNITY): Payer: Self-pay

## 2021-04-05 ENCOUNTER — Ambulatory Visit (INDEPENDENT_AMBULATORY_CARE_PROVIDER_SITE_OTHER): Payer: Self-pay | Admitting: Student

## 2021-04-05 VITALS — BP 127/85 | HR 88 | Temp 98.1°F | Ht 66.0 in | Wt 154.0 lb

## 2021-04-05 DIAGNOSIS — E1110 Type 2 diabetes mellitus with ketoacidosis without coma: Secondary | ICD-10-CM

## 2021-04-05 DIAGNOSIS — S91302A Unspecified open wound, left foot, initial encounter: Secondary | ICD-10-CM

## 2021-04-05 DIAGNOSIS — F1110 Opioid abuse, uncomplicated: Secondary | ICD-10-CM

## 2021-04-05 DIAGNOSIS — M62838 Other muscle spasm: Secondary | ICD-10-CM

## 2021-04-05 MED ORDER — BACLOFEN 10 MG PO TABS
5.0000 mg | ORAL_TABLET | Freq: Three times a day (TID) | ORAL | 0 refills | Status: AC
  Filled 2021-04-05 – 2021-04-13 (×2): qty 45, 30d supply, fill #0

## 2021-04-05 MED ORDER — BUPRENORPHINE HCL-NALOXONE HCL 8-2 MG SL SUBL
1.0000 | SUBLINGUAL_TABLET | Freq: Two times a day (BID) | SUBLINGUAL | 0 refills | Status: DC
  Filled 2021-04-05 – 2021-04-06 (×2): qty 28, 14d supply, fill #0

## 2021-04-05 NOTE — Assessment & Plan Note (Signed)
In April, found to have A1c 13.7%. During his last visit a week and a half ago, recorded that patient and provider had extensive discussion regarding complications of uncontrolled diabetes. [name] reports today that he has not been taking his insulin as prescribed and has only taken his sugars twice. On his glucometer, there are two readings of 340 and 550. He is endorsing continued polyuria and polydipsia, which appears to be chronic. He did report some lightheadedness yesterday while working outside, but mentions improvement of symptoms with hydration and rest.   Today a large portion of our discussion surrounded uncontrolled diabetes and possible long term effects. [name] reports that he is motivated to get his sugars under control. Emphasized that he is at higher risk for cardiovascular events, including heart attack, strokes, and peripheral arterial disease, which could lead to amputations. Also discussed strategies to help build good habits in taking his medications. - Levemir 20U daily - Sitagliptin 100mg  daily - Follow-up in four weeks

## 2021-04-05 NOTE — Patient Instructions (Addendum)
[name], it was a pleasure seeing you today!  I have ordered the following labs today:  Lab Orders  No laboratory test(s) ordered today     Tests ordered today:  None  Referrals ordered today:   Referral Orders  No referral(s) requested today     I have ordered the following medication/changed the following medications:   Stop the following medications: Medications Discontinued During This Encounter  Medication Reason  . buprenorphine-naloxone (SUBOXONE) 8-2 mg SUBL SL tablet Reorder     Start the following medications: Meds ordered this encounter  Medications  . buprenorphine-naloxone (SUBOXONE) 8-2 mg SUBL SL tablet    Sig: Place 1 tablet under the tongue in the morning and at bedtime.    Dispense:  28 tablet    Refill:  0    NADEAN:[id number] IM Program  . baclofen (LIORESAL) 10 MG tablet    Sig: Take 0.5 tablets (5 mg total) by mouth 3 (three) times daily.    Dispense:  45 tablet    Refill:  0    IM program     Follow-up: [facility] Clinic on July 5th    Today we discussed: Your diabetes and muscle pain. Please make sure to take your sugars three times daily before meals in addition to taking your insulin and medication. It will be very important in reducing risk of heart attacks, strokes, and arterial disease in your legs and arms, which could lead to amputations. We have prescribed baclofen for the muscle pain. There is room to increase dosage if need be.  Please make sure to arrive 15 minutes prior to your next appointment. If you arrive late, you may be asked to reschedule.   We look forward to seeing you next time. Please call our clinic at [phone] if you have any questions or concerns. The best time to call is Monday-Friday from 9am-4pm, but there is someone available 24/7. If after hours or the weekend, call the main hospital number and ask for the Internal Medicine Resident On-Call. If you need medication refills, please notify your pharmacy one  week in advance and they will send [place] a request.  Thank you for letting us take part in your care. Wishing you the best!  Thank you, Evlyn Kanner, MD

## 2021-04-05 NOTE — Assessment & Plan Note (Signed)
[name] reports compliance with doxycycline. He reports there is much less tenderness than during his previous visit. Encouraged [name] to continue out the rest of his antibiotic course. In addition, discussed importance of close glycemic control to promote wound healing.

## 2021-04-05 NOTE — Progress Notes (Signed)
   CC: diabetes follow-up, back/leg pain  HPI:  [name] is a 30 y.o. with medical history as below presenting to [facility] for follow-up for his diabetes and to discuss his back and leg pain.  Please see problem-based list for further details, assessments, and plans.  Past Medical History:  Diagnosis Date  . Asthma   . CAP (community acquired pneumonia) 03/17/2018  . DKA (diabetic ketoacidoses) 06/2017; 10/17/2017  . DKA (diabetic ketoacidoses) 02/2018  . DKA (diabetic ketoacidoses) 12/2018  . HTN (hypertension) 07/25/2017   Lisinopril 40mg  Daily  . Hyperglycemia 09/24/2017  . Obesity   . Type II diabetes mellitus (HCC)    "dx'd 06/2017"   Review of Systems:  As per HPI  Physical Exam:  Vitals:   04/05/21 1024 04/05/21 1025  BP: 127/85 127/85  Pulse:  88  Temp:  98.1 F (36.7 C)  TempSrc:  Oral  SpO2:  100%  Weight: 154 lb (69.9 kg) 154 lb (69.9 kg)  Height:  5\' 6"  (1.676 m)   General: Well-developed, well-nourished. No acute distress CV: Regular rate, rhythm. No murmurs, rubs, gallops appreciated. Distal pulses 2+ bilaterally. Pulm: Normal work of breathing on room air. Clear to auscultation bilaterally. Foot: Plantar aspect of left lateral foot with thickened, dark skin. No drainage, purulence, fluctuance appreciated on left great toe. Non-tender on palpation.   Assessment & Plan:   See Encounters Tab for problem based charting.  Patient discussed with Dr. 06/05/21

## 2021-04-05 NOTE — Assessment & Plan Note (Addendum)
Mr. Cromwell mentions that he has needed an increase in Suboxone lately to help alleviate pain although his cravings have been controlled. His current prescription is for Suboxone twice daily, but he has been taking up to four daily. He reports back and bilateral hamstring pain, describing this as "tight muscles" and burning. Mentions that he has tried gabapentin in the past with no alleviation of pain.  Discussed with Mr. Warmuth that Suboxone is to help control cravings, not to help with pain. Explained that taking more Suboxone than prescribed will make him run out of medication early, leaving him at risk for withdrawal. We will trial baclofen for his muscle pain and he will follow-up in four weeks. - Suboxone twice daily - Baclofen 5mg  three times daily as needed - Return to clinic in one month

## 2021-04-07 ENCOUNTER — Other Ambulatory Visit (HOSPITAL_COMMUNITY): Payer: Self-pay

## 2021-04-11 NOTE — Progress Notes (Signed)
Internal Medicine Clinic Attending ? ?Case discussed with Dr. Braswell  At the time of the visit.  We reviewed the resident?s history and exam and pertinent patient test results.  I agree with the assessment, diagnosis, and plan of care documented in the resident?s note.  ?

## 2021-04-12 ENCOUNTER — Institutional Professional Consult (permissible substitution): Payer: Self-pay | Admitting: Behavioral Health

## 2021-04-12 ENCOUNTER — Other Ambulatory Visit: Payer: Self-pay

## 2021-04-12 ENCOUNTER — Telehealth: Payer: Self-pay | Admitting: Behavioral Health

## 2021-04-12 NOTE — Telephone Encounter (Signed)
Contacted Pt several times re: appt today @ 10:00am. Related to Pt nature of Intro call & assessment for needs invlg Recovery Support/Relapse Prevention.   Lft msg for Pt to f/u & call Front Desk to r/s.  Dr. Monna Fam

## 2021-04-13 ENCOUNTER — Other Ambulatory Visit (HOSPITAL_COMMUNITY): Payer: Self-pay

## 2021-04-20 NOTE — Addendum Note (Signed)
Addended by: Neomia Dear on: 04/20/2021 04:59 PM   Modules accepted: Orders

## 2021-05-02 ENCOUNTER — Other Ambulatory Visit: Payer: Self-pay | Admitting: Student in an Organized Health Care Education/Training Program

## 2021-05-02 DIAGNOSIS — F1110 Opioid abuse, uncomplicated: Secondary | ICD-10-CM

## 2021-05-03 ENCOUNTER — Ambulatory Visit: Payer: Self-pay | Admitting: Behavioral Health

## 2021-05-03 ENCOUNTER — Encounter: Payer: Self-pay | Admitting: *Deleted

## 2021-05-03 DIAGNOSIS — F419 Anxiety disorder, unspecified: Secondary | ICD-10-CM

## 2021-05-03 DIAGNOSIS — F4321 Adjustment disorder with depressed mood: Secondary | ICD-10-CM

## 2021-05-03 DIAGNOSIS — F331 Major depressive disorder, recurrent, moderate: Secondary | ICD-10-CM
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
## 2021-05-04 ENCOUNTER — Other Ambulatory Visit: Payer: Self-pay | Admitting: Student in an Organized Health Care Education/Training Program

## 2021-05-04 ENCOUNTER — Other Ambulatory Visit (HOSPITAL_COMMUNITY): Payer: Self-pay

## 2021-05-04 DIAGNOSIS — F1110 Opioid abuse, uncomplicated: Secondary | ICD-10-CM

## 2021-05-05 ENCOUNTER — Other Ambulatory Visit (HOSPITAL_COMMUNITY): Payer: Self-pay

## 2021-05-05 MED ORDER — BUPRENORPHINE HCL-NALOXONE HCL 8-2 MG SL SUBL
1.0000 | SUBLINGUAL_TABLET | Freq: Two times a day (BID) | SUBLINGUAL | 1 refills | Status: DC
  Filled 2021-05-05: qty 28, 14d supply, fill #0
  Filled 2021-05-24: qty 28, 14d supply, fill #1

## 2021-05-12 ENCOUNTER — Other Ambulatory Visit (HOSPITAL_COMMUNITY): Payer: Self-pay

## 2021-05-12 ENCOUNTER — Telehealth: Payer: Self-pay | Admitting: Student

## 2021-05-12 NOTE — Telephone Encounter (Signed)
Pt requesting a call back about getting another refill.  Pt states he left his medication bag with all of medicines at his father's home.   buprenorphine-naloxone buprenorphine-naloxone (SUBOXONE) 8-2 mg SUBL SL tablet  Place 1 tablet under the tongue in the morning and at bedtime., Starting Thu 05/05/2021, Normal  Dispense: 28 tablet  Refills: 1 of 1 remaining  Last Dispense: 05/05/2021  Pharmacy: [facility] (Ph: [phone]

## 2021-05-24 ENCOUNTER — Other Ambulatory Visit: Payer: Self-pay

## 2021-05-24 ENCOUNTER — Ambulatory Visit (INDEPENDENT_AMBULATORY_CARE_PROVIDER_SITE_OTHER): Payer: Self-pay | Admitting: Student

## 2021-05-24 ENCOUNTER — Other Ambulatory Visit (HOSPITAL_COMMUNITY): Payer: Self-pay

## 2021-05-24 ENCOUNTER — Encounter: Payer: Self-pay | Admitting: Student

## 2021-05-24 VITALS — BP 131/79 | HR 85 | Temp 98.1°F | Wt 171.8 lb

## 2021-05-24 DIAGNOSIS — G629 Polyneuropathy, unspecified: Secondary | ICD-10-CM

## 2021-05-24 DIAGNOSIS — F1110 Opioid abuse, uncomplicated: Secondary | ICD-10-CM

## 2021-05-24 DIAGNOSIS — R3589 Other polyuria: Secondary | ICD-10-CM

## 2021-05-24 DIAGNOSIS — E1142 Type 2 diabetes mellitus with diabetic polyneuropathy: Secondary | ICD-10-CM

## 2021-05-24 DIAGNOSIS — E1165 Type 2 diabetes mellitus with hyperglycemia: Secondary | ICD-10-CM

## 2021-05-24 DIAGNOSIS — R631 Polydipsia: Secondary | ICD-10-CM

## 2021-05-24 DIAGNOSIS — E1110 Type 2 diabetes mellitus with ketoacidosis without coma: Secondary | ICD-10-CM

## 2021-05-24 DIAGNOSIS — Z79899 Other long term (current) drug therapy: Secondary | ICD-10-CM

## 2021-05-24 LAB — POCT GLYCOSYLATED HEMOGLOBIN (HGB A1C): HbA1c POC (<> result, manual entry): >14 % — AB (ref 4.0–5.6)

## 2021-05-24 LAB — GLUCOSE, CAPILLARY: Glucose-Capillary: 363 mg/dL — ABNORMAL HIGH (ref 70–99)

## 2021-05-24 MED ORDER — INSULIN NPH (HUMAN) (ISOPHANE) 100 UNIT/ML ~~LOC~~ SUSP
15.0000 [IU] | Freq: Two times a day (BID) | SUBCUTANEOUS | 3 refills | Status: DC
  Filled 2021-05-24: qty 10, 30d supply, fill #0

## 2021-05-24 MED ORDER — BUPRENORPHINE HCL-NALOXONE HCL 8-2 MG SL SUBL
1.0000 | SUBLINGUAL_TABLET | Freq: Two times a day (BID) | SUBLINGUAL | 1 refills | Status: DC
Start: 2021-05-24 — End: 2021-06-22
  Filled 2021-05-24: qty 28, 14d supply, fill #0
  Filled 2021-06-03 – 2021-06-06 (×2): qty 28, 14d supply, fill #1

## 2021-05-24 MED ORDER — GABAPENTIN 100 MG PO CAPS
100.0000 mg | ORAL_CAPSULE | Freq: Three times a day (TID) | ORAL | 0 refills | Status: DC
  Filled 2021-05-24: qty 90, 30d supply, fill #0

## 2021-05-24 MED ORDER — "INSULIN SYRINGE-NEEDLE U-100 30G X 5/16"" 0.3 ML MISC"
1.0000 | Freq: Two times a day (BID) | 0 refills | Status: DC
  Filled 2021-05-24: qty 60, 30d supply, fill #0

## 2021-05-24 NOTE — Patient Instructions (Signed)
[name], it was a pleasure seeing you today!  Today we discussed: - We have sent in prescription for suboxone as well as gabapentin.  - We are also switching your insulin regimen. This is twice daily instead of once at night. We will have a telehealth visit in two weeks. In the mean time, please make sure you are checking your sugars twice daily.  I have ordered the following labs today:   Lab Orders  ToxAssure Select,+Antidepr,UR  Glucose, capillary  POC Hbg A1C    I have ordered the following medication/changed the following medications:   Stop the following medications: Medications Discontinued During This Encounter  Medication Reason   buprenorphine-naloxone (SUBOXONE) 8-2 mg SUBL SL tablet Reorder   insulin detemir (LEVEMIR) 100 UNIT/ML FlexPen      Start the following medications: Meds ordered this encounter  Medications   buprenorphine-naloxone (SUBOXONE) 8-2 mg SUBL SL tablet    Sig: Place 1 tablet under the tongue in the morning and at bedtime.    Dispense:  28 tablet    Refill:  1    NADEAN:[id number] IM Program   gabapentin (NEURONTIN) 100 MG capsule    Sig: Take 1 capsule (100 mg total) by mouth 3 (three) times daily.    Dispense:  90 capsule    Refill:  0    IM program   insulin NPH Human (NOVOLIN N) 100 UNIT/ML injection    Sig: Take 15 units twice daily    Dispense:  10 mL    Refill:  3    IM program     Follow-up:  2 weeks    Please make sure to arrive 15 minutes prior to your next appointment. If you arrive late, you may be asked to reschedule.   We look forward to seeing you next time. Please call our clinic at [phone] if you have any questions or concerns. The best time to call is Monday-Friday from 9am-4pm, but there is someone available 24/7. If after hours or the weekend, call the main hospital number and ask for the Internal Medicine Resident On-Call. If you need medication refills, please notify your pharmacy one week in advance  and they will send [place] a request.  Thank you for letting us take part in your care. Wishing you the best!  Thank you, Evlyn Kanner, MD

## 2021-05-24 NOTE — Progress Notes (Signed)
   CC: diabetes, OUD  HPI:  Mr.Andrej Bowen is a 30 y.o. with medical history as below presenting to [facility] for diabetes and OUD.  Please see problem-based list for further details, assessments, and plans.  Past Medical History:  Diagnosis Date   Asthma    HTN (hypertension) 07/25/2017   Lisinopril 40mg  Daily   Hyperglycemia 09/24/2017   Type II diabetes mellitus (HCC)    "dx'd 06/2017"   Review of Systems:  As per HPI  Physical Exam:  Vitals:   05/24/21 1451 05/24/21 1454  BP:  131/79  Pulse:  85  Temp:  98.1 F (36.7 C)  SpO2:  100%  Weight: 171 lb 12.8 oz (77.9 kg)    General: Well-developed, well-nourished. No acute distress CV: Regular rate, rhythm. No murmurs, rubs, gallops. Distal pulses 2+ bilaterally. Pulm: Normal work of breathing. Clear to auscultation bilaterally. MSK: Normal bulk, tone. No pitting edema bilaterally. Skin: Warm, dry. No rashes or lesions appreciated. Neuro: Awake, alert, conversing appropriately. Psych: Normal mood, affect, speech.  Assessment & Plan:   See Encounters Tab for problem based charting.  Patient discussed with Dr. 05/26/21

## 2021-05-25 ENCOUNTER — Ambulatory Visit: Payer: Self-pay | Admitting: Behavioral Health

## 2021-05-25 ENCOUNTER — Telehealth: Payer: Self-pay | Admitting: Behavioral Health

## 2021-05-25 ENCOUNTER — Other Ambulatory Visit (HOSPITAL_COMMUNITY): Payer: Self-pay

## 2021-05-25 LAB — BMP8+ANION GAP
Anion Gap: 13 mmol/L (ref 10.0–18.0)
BUN/Creatinine Ratio: 10 (ref 9–20)
BUN: 6 mg/dL (ref 6–20)
CO2: 27 mmol/L (ref 20–29)
Calcium: 8.7 mg/dL (ref 8.7–10.2)
Chloride: 94 mmol/L — ABNORMAL LOW (ref 96–106)
Creatinine, Ser: 0.59 mg/dL — ABNORMAL LOW (ref 0.76–1.27)
Glucose: 359 mg/dL — ABNORMAL HIGH (ref 65–99)
Potassium: 4.6 mmol/L (ref 3.5–5.2)
Sodium: 134 mmol/L (ref 134–144)
eGFR: 136 mL/min/{1.73_m2} (ref >59)

## 2021-05-25 NOTE — Assessment & Plan Note (Signed)
[name] reports worsening pain in his lower extremities over the last few weeks. Mentions that he previously was taking 3-4 suboxone daily in order to help control the pain. Describes this as sharp, shooting pains that radiate down to bilateral feet. He is currently taking daily duloxetine for this. He mentions he previously was taking gabapentin, but says this was a long time ago.  I discussed with [name] that taking more suboxone than prescribed puts him at risk for further withdrawals once he runs out. Explained that we have medications that are specifically for the neuropathic pain he is describing. We will start with a low-dose gabapentin today. Encouraged [name] to let us know if this dosage is not helping with the pain, as we can up-titrate as needed. I did mention to him that this medication could cause drowsiness and to make sure he is not over-exerting himself while working during the day.  - Start gabapentin 100mg  three times daily, up-titrate as needed - Continue duloxetine 60 mg daily

## 2021-05-25 NOTE — Assessment & Plan Note (Addendum)
A1c today >14.0% from 13.7% in April of this year. Mr. Lax has a long history of uncontrolled diabetes with multiple previous hospitalizations for DKA. Previous work-up revealed negative GAD antibodies. Of note, he has had significant weight loss over the last 3 years (160lbs). Today Mr. Kitchings reports that his chronic polyuria and polydipsia have slightly improved since his last visit. However, he is only checking his sugars a few times weekly. Usual readings 300-400, lowest ~200. He was taking 20 units of Levemir until the prescription ran out, and has since been using 30 units of Basaglar. This had not been prescribed to him, but per chart review had previously been taking his mother's Hospital doctor.  I discussed with Mr. Duque that I am extremely worried that he will have serious consequences of uncontrolled diabetes in the near future if his sugars do not improve soon. Discussed risks of renal disease, eye disease, cardiovascular events, and arterial disease. On his foot exam today, there are areas of tenderness on the plantar aspect. These do not appear necrotic, but will need to be monitored at all subsequent appointments. In order to obtain better sugar control, we will switch from one long-acting insulin to twice daily Novolin, especially given financial constraints. We will start with 15 units twice daily and have him follow-up with a telehealth visit to up-titrate. I have explained to Mr. Tinkey the importance of taking his sugars daily so that we can accurately assess his insulin needs.  - Stop Levemir - Start Novolin 70/30 15 units twice daily - BMP today - Telehealth in two weeks to up-titrate as needed - Will need foot exams at each appointment  - Meet with diabetes educator, clinical pharmacist

## 2021-05-25 NOTE — Telephone Encounter (Signed)
Contacted Pt on [phone] phone & lft 2 msg today for 9:00am session of psychotherapy. Requested Pt r/s @ his convenience.   Dr. Monna Fam

## 2021-05-25 NOTE — Assessment & Plan Note (Addendum)
[name] reports that he has been out of suboxone for roughly the last week. Two weeks ago his brother suddenly passed away and accidentally left his medications at his parents' house. He states that he previously was taking 3-4 suboxone daily to help with the pain in his lower extremities. He denies any cravings or relapses. Also denies any drug use at this time.   Discussed with [name] that it is not appropriate to take more suboxone than prescribed. Mentioned that his pain can be managed more safely with other medications, such as gabapentin. In addition, he is at high risk for withdrawal due to running out of the medication too soon. Encouraged him to contact the clinic if he continues to be in pain so we can up-titrate pain medications.  - Continue suboxone 8-2mg  twice daily - ToxAssure today - Follow-up in one month

## 2021-05-26 ENCOUNTER — Other Ambulatory Visit: Payer: Self-pay | Admitting: Student

## 2021-05-26 DIAGNOSIS — E1110 Type 2 diabetes mellitus with ketoacidosis without coma: Secondary | ICD-10-CM

## 2021-05-27 LAB — TOXASSURE SELECT,+ANTIDEPR,UR

## 2021-05-27 NOTE — Progress Notes (Signed)
Internal Medicine Clinic Attending ? ?Case discussed with Dr. Braswell  At the time of the visit.  We reviewed the resident?s history and exam and pertinent patient test results.  I agree with the assessment, diagnosis, and plan of care documented in the resident?s note.  ?

## 2021-05-31 ENCOUNTER — Telehealth: Payer: Self-pay

## 2021-05-31 NOTE — Telephone Encounter (Signed)
   Telephone encounter was:  Unsuccessful.  05/31/2021 Name: [name] MRN: [id number] DOB: Nov 07, 1990  Unsuccessful outbound call made today to assist with:   affordable care information.  Outreach Attempt:  1st Attempt  A HIPAA compliant voice message was left requesting a return call.  Instructed patient to call back at [phone].  [name], AAS Paralegal, [facility] Care Guide  Embedded Care Coordination [place]  Care Management  [address] [place], [place] [address] ??[name]  ?? [phone]   [email or web address]

## 2021-06-02 ENCOUNTER — Other Ambulatory Visit (HOSPITAL_COMMUNITY): Payer: Self-pay

## 2021-06-03 ENCOUNTER — Telehealth: Payer: Self-pay

## 2021-06-03 ENCOUNTER — Other Ambulatory Visit (HOSPITAL_COMMUNITY): Payer: Self-pay

## 2021-06-03 MED FILL — Duloxetine HCl Enteric Coated Pellets Cap 60 MG (Base Eq): ORAL | 30 days supply | Qty: 30 | Fill #2 | Status: CN

## 2021-06-03 NOTE — Telephone Encounter (Signed)
   Telephone encounter was:  Unsuccessful.  06/03/2021 Name: Dang Mathison MRN: [id number] DOB: [birth year]  Unsuccessful outbound call made today to assist with:   finding health insurance.  Outreach Attempt:  2nd Attempt  A HIPAA compliant voice message was left requesting a return call.  Instructed patient to call back at [phone].  Roiza Wiedel, AAS Paralegal, [facility] Care Guide  Embedded Care Coordination   Care Management  [address] [place], [place] [address] ??[email or web address]  ?? [phone]   [email or web address]

## 2021-06-06 ENCOUNTER — Other Ambulatory Visit (HOSPITAL_COMMUNITY): Payer: Self-pay

## 2021-06-07 ENCOUNTER — Telehealth: Payer: Self-pay

## 2021-06-07 NOTE — Telephone Encounter (Signed)
   Telephone encounter was:  Successful.  06/07/2021 Name: Haru Shaff MRN: [id number] DOB: 05-02-1991  Viren Lebeau is a 30 y.o. year old male who is a primary care patient of Doran Stabler, DO . The community resource team was consulted for assistance with  health insurance information.  Care guide performed the following interventions: Spoke with patient confirmed email address [email or web address] sent information for [facility], Affordable Health Care filing assistance and [facility] Card. Patient stated he would send me a message to let me know he has received email.   Follow Up Plan:  Care guide will follow up with patient by phone over the next 7 days.  Briseis Aguilera, AAS Paralegal, [facility] Care Guide  Embedded Care Coordination [place]  Care Management  [address] [place], [place] [address] ??[email or web address]  ?? [phone]   [email or web address]

## 2021-06-08 ENCOUNTER — Telehealth: Payer: Self-pay

## 2021-06-08 ENCOUNTER — Telehealth: Payer: Self-pay | Admitting: Dietician

## 2021-06-08 NOTE — Telephone Encounter (Signed)
   Telephone encounter was:  Successful.  06/08/2021 Name: Rayshon Albaugh MRN: [id number] DOB: September 18, 1991  Yarden Hillis is a 30 y.o. year old male who is a primary care patient of Doran Stabler, DO . The community resource team was consulted for assistance with  health insuranc.  Care guide performed the following interventions: Follow up call placed to the patient to discuss status of referral Spoke with patient he has not check his email yet but will check when he gets home from work.  He stated that he would send me a message once he receives the email.   Follow Up Plan:  Care guide will follow up with patient by phone over the next 7 days.  Saroya Riccobono, AAS Paralegal, [facility] Care Guide  Embedded Care Coordination [place]  Care Management  [address] [place], [place] [address] ??[email or web address]  ?? [phone]   [email or web address]

## 2021-06-08 NOTE — Telephone Encounter (Signed)
Left voicemail for return call. Will mail patient information about diabetes meal planning.  Norm Parcel, RD 06/08/2021 10:06 AM.

## 2021-06-09 ENCOUNTER — Telehealth: Payer: Self-pay

## 2021-06-09 NOTE — Telephone Encounter (Signed)
   Telephone encounter was:  Unsuccessful.  06/09/2021 Name: [name] MRN: [id number] DOB: [birth year]  Unsuccessful outbound call made today to assist with:  Left message for patient to return my call regarding emailed resources for [facility], Affordable Care assistance and [employer].    Outreach Attempt:  3rd Attempt.  Referral closed unable to contact patient.  A HIPAA compliant voice message was left requesting a return call.  Instructed patient to call back at [phone].  Hester Forget, AAS Paralegal, [facility] Care Guide  Embedded Care Coordination [facility]  [address] [place], [place] [address] ??[email or web address]  ?? [phone]   [email or web address]

## 2021-06-13 ENCOUNTER — Other Ambulatory Visit (HOSPITAL_COMMUNITY): Payer: Self-pay

## 2021-06-15 ENCOUNTER — Encounter: Payer: Self-pay | Admitting: Pharmacist

## 2021-06-16 ENCOUNTER — Encounter: Payer: Self-pay | Admitting: Pharmacist

## 2021-06-21 ENCOUNTER — Other Ambulatory Visit (HOSPITAL_COMMUNITY): Payer: Self-pay

## 2021-06-21 ENCOUNTER — Ambulatory Visit (INDEPENDENT_AMBULATORY_CARE_PROVIDER_SITE_OTHER): Payer: Self-pay | Admitting: Pharmacist

## 2021-06-21 DIAGNOSIS — E1110 Type 2 diabetes mellitus with ketoacidosis without coma: Secondary | ICD-10-CM

## 2021-06-21 MED ORDER — TRUE METRIX BLOOD GLUCOSE TEST VI STRP
ORAL_STRIP | 0 refills | Status: DC
  Filled 2021-06-21: qty 100, fill #0
  Filled 2021-06-22: qty 100, 90d supply, fill #0

## 2021-06-21 MED ORDER — OZEMPIC (0.25 OR 0.5 MG/DOSE) 2 MG/1.5ML ~~LOC~~ SOPN
0.2500 mg | PEN_INJECTOR | SUBCUTANEOUS | 0 refills | Status: DC
  Filled 2021-06-21 – 2021-06-22 (×2): qty 1.5, 56d supply, fill #0

## 2021-06-21 MED ORDER — INSULIN DEGLUDEC 100 UNIT/ML ~~LOC~~ SOPN
20.0000 [IU] | PEN_INJECTOR | Freq: Every day | SUBCUTANEOUS | 0 refills | Status: DC
  Filled 2021-06-21: qty 15, 75d supply, fill #0
  Filled 2021-06-22: qty 6, 30d supply, fill #0

## 2021-06-21 MED ORDER — METFORMIN HCL ER 500 MG PO TB24
500.0000 mg | ORAL_TABLET | Freq: Every day | ORAL | 0 refills | Status: DC
  Filled 2021-06-21 – 2021-06-22 (×2): qty 30, 30d supply, fill #0

## 2021-06-21 MED ORDER — TRUEPLUS LANCETS 28G MISC
0 refills | Status: DC
  Filled 2021-06-21: qty 100, 30d supply, fill #0
  Filled 2021-06-22: qty 100, 90d supply, fill #0

## 2021-06-21 NOTE — Patient Instructions (Signed)
Mr. Kranz it was a pleasure seeing you today.   Please do the following:  Start Ozempic 0.5mg  once weekly and metformin 500mg  once a dayas directed today during your appointment. If you have any questions or if you believe something has occurred because of this change, call me or your doctor to let one of know.  We are going to switch your insulin to Tresiba 20 units once daaily Continue checking blood sugars at home. It's really important that you record these and bring these in to your next doctor's appointment.  Continue making the lifestyle changes we've discussed together during our visit. Diet and exercise play a significant role in improving your blood sugars.  Follow-up with me in two weeks   Hypoglycemia or low blood sugar:   Low blood sugar can happen quickly and may become an emergency if not treated right away.   While this shouldn't happen often, it can be brought upon if you skip a meal or do not eat enough. Also, if your insulin or other diabetes medications are dosed too high, this can cause your blood sugar to go to low.   Warning signs of low blood sugar include: Feeling shaky or dizzy Feeling weak or tired  Excessive hunger Feeling anxious or upset  Sweating even when you aren't exercising  What to do if I experience low blood sugar? Follow the Rule of 15 Check your blood sugar with your meter. If lower than 70, proceed to step 2.  Treat with 15 grams of fast acting carbs which is found in 3-4 glucose tablets. If none are available you can try hard candy, 1 tablespoon of sugar or honey,4 ounces of fruit juice, or 6 ounces of REGULAR soda.  Re-check your sugar in 15 minutes. If it is still below 70, do what you did in step 2 again. If your blood sugar has come back up, go ahead and eat a snack or small meal made up of complex carbs (ex. Whole grains) and protein at this time to avoid recurrence of low blood sugar.

## 2021-06-21 NOTE — Progress Notes (Signed)
   Subjective:    Patient ID: [name], male    DOB: 10/20/1991, 30 y.o.   MRN: [id number]  HPI Patient is a 30 y.o. male who presents for diabetes management. He is in good spirits and presents with assistance. Patient was referred and last seen by provider, Dr. Marijo Conception, on 05/24/21. Patient reports diabetes was diagnosed in 2019.   Insurance coverage/medication affordability: self-pay  Current diabetes medications include: insulin Levemir 20 units once daily patient reports having extra at home and not picking up or starting the 70/30 insulin that was discussed at last office visit with Dr. Marijo Conception Patient states that He is not taking his medications as prescribed. Patient denies adherence with medications. Patient states that He misses his medications 2-3 times per week, on average.  Do you feel that your medications are working for you?  No; blood glucose still high  Have you been experiencing any side effects to the medications prescribed? no  Do you have any problems obtaining medications due to transportation or finances?  Yes; cost   Patient reported dietary habits:  Patient reports previously eating fried foods and drinking juices but that he has cut back on juices and has tried to switch to baked foods.  Drinks: cut back on juices and switched to mostly water,    Patient denies hypoglycemic events. Patient reports polyuria (increased urination).  Patient denies polyphagia (increased appetite).  Patient reports polydipsia (increased thirst).  Patient reports neuropathy (nerve pain). Patient reports visual changes. Patient reports self foot exams.   Home fasting blood sugars: 300-400's; this AM was 360   Objective:   Labs:   Physical Exam Neurological:     Mental Status: He is alert and oriented to person, place, and time.    Review of Systems  Eyes:  Positive for blurred vision.  Gastrointestinal:  Negative for nausea and vomiting.  Genitourinary:   Positive for frequency.   Lab Results  Component Value Date   HGBA1C >14.0 (A) 05/24/2021   HGBA1C 13.7 (A) 02/15/2021   HGBA1C 6.8 (A) 09/15/2020    Lab Results  Component Value Date   MICRALBCREAT 13 08/18/2020    Lipid Panel  No results found for: CHOL, TRIG, HDL, CHOLHDL, VLDL, LDLCALC, LDLDIRECT  Assessment/Plan:   T2DM is not controlled likely due to sub-optimal adherence, diet, and lack of follow-up. Additional pharmacotherapy is warranted. Will initiate Ozempic 0.25mg  once weekly as well as metformin XR 500mg  once daily. Patient reports previous intolerance to metformin but willing to try again. Patient educated on purpose, proper use and potential adverse effects of metformin and Ozempic. Following instruction patient verbalized understanding of treatment plan.    Started basal insulin degludec ) 20 units once daily.  Started GLP-1 semaglutide (Ozempic) 0.25mg  once weekly.  Started metformin XR 500mg  once daily Extensively discussed pathophysiology of diabetes, dietary effects on blood sugar control, and recommended lifestyle interventions. Counseled on s/sx of and management of hypoglycemia Next A1C anticipated October 2022.   Follow-up appointment 2 weeks to review sugar readings. Written patient instructions provided.  This appointment required 50 minutes of direct patient care.  Thank you for involving pharmacy to assist in providing this patient's care.

## 2021-06-22 ENCOUNTER — Other Ambulatory Visit (HOSPITAL_COMMUNITY): Payer: Self-pay

## 2021-06-22 ENCOUNTER — Other Ambulatory Visit: Payer: Self-pay

## 2021-06-22 DIAGNOSIS — F1110 Opioid abuse, uncomplicated: Secondary | ICD-10-CM

## 2021-06-22 MED ORDER — TRUE METRIX METER W/DEVICE KIT
PACK | 0 refills | Status: AC
  Filled 2021-06-22: qty 1, 365d supply, fill #0
  Filled 2022-01-01: qty 1, fill #0

## 2021-06-22 NOTE — Assessment & Plan Note (Signed)
T2DM is not controlled likely due to sub-optimal adherence, diet, and lack of follow-up. Additional pharmacotherapy is warranted. Will initiate Ozempic 0.25mg  once weekly as well as metformin XR 500mg  once daily. Patient reports previous intolerance to metformin but willing to try again. Patient educated on purpose, proper use and potential adverse effects of metformin and Ozempic. Following instruction patient verbalized understanding of treatment plan.    1. Started basal insulin degludec ) 20 units once daily.  2. Started GLP-1 semaglutide (Ozempic) 0.25mg  once weekly.  3. Started metformin XR 500mg  once daily 4. Extensively discussed pathophysiology of diabetes, dietary effects on blood sugar control, and recommended lifestyle interventions. 5. Counseled on s/sx of and management of hypoglycemia 6. Next A1C anticipated October 2022.   Follow-up appointment 2 weeks to review sugar readings. Written patient instructions provided.

## 2021-06-22 NOTE — Telephone Encounter (Signed)
Last ov 05/24/21 Suboxone lst written 05/24/21 #28 No furture appts scheduled Will send to pcp for review  Appt request sent to front office  Please advise

## 2021-06-22 NOTE — Telephone Encounter (Signed)
buprenorphine-naloxone (SUBOXONE) 8-2 mg SUBL SL tablet, REFILL REQUEST @ [facility]. ?

## 2021-06-23 ENCOUNTER — Other Ambulatory Visit (HOSPITAL_COMMUNITY): Payer: Self-pay

## 2021-06-23 MED ORDER — BUPRENORPHINE HCL-NALOXONE HCL 8-2 MG SL SUBL
1.0000 | SUBLINGUAL_TABLET | Freq: Two times a day (BID) | SUBLINGUAL | 1 refills | Status: DC
Start: 2021-06-23 — End: 2021-08-09
  Filled 2021-06-23: qty 28, 14d supply, fill #0
  Filled 2021-07-20: qty 28, 14d supply, fill #1

## 2021-06-23 NOTE — Telephone Encounter (Signed)
Patient with OUD, last seen one month ago, toxicology has been appropriate, seemingly doing well. I will refill one month supply, and we should see him in office prior to next refill.

## 2021-06-28 NOTE — Progress Notes (Signed)
Evaluation and management procedures were performed by the Clinical Pharmacy Practitioner under my supervision and collaboration. I have reviewed the Practitioner's note and chart, and I agree with the management and plan as documented above. ° °

## 2021-06-29 ENCOUNTER — Other Ambulatory Visit: Payer: Self-pay

## 2021-06-30 NOTE — Progress Notes (Signed)
Submitted application for TRESIBA & OZEMPIC to [employer] for patient assistance.   Phone: [phone]  

## 2021-07-05 ENCOUNTER — Encounter: Payer: Self-pay | Admitting: Pharmacist

## 2021-07-13 ENCOUNTER — Telehealth: Payer: Self-pay

## 2021-07-13 NOTE — Telephone Encounter (Signed)
INFORMED PT THAT [employer] NEEDS PROOF OF INCOME BEFORE APPROVING ASSISTANCE. PT CAN EMAIL PAYSTUBS & REQUESTED I SEND MY EMAIL ON MYCHART.

## 2021-07-20 ENCOUNTER — Other Ambulatory Visit (HOSPITAL_COMMUNITY): Payer: Self-pay

## 2021-07-25 ENCOUNTER — Encounter: Payer: Self-pay | Admitting: Student

## 2021-08-08 ENCOUNTER — Encounter: Payer: Self-pay | Admitting: Internal Medicine

## 2021-08-09 ENCOUNTER — Other Ambulatory Visit: Payer: Self-pay

## 2021-08-09 DIAGNOSIS — F1110 Opioid abuse, uncomplicated: Secondary | ICD-10-CM

## 2021-08-10 ENCOUNTER — Other Ambulatory Visit (HOSPITAL_COMMUNITY): Payer: Self-pay

## 2021-08-10 MED ORDER — BUPRENORPHINE HCL-NALOXONE HCL 8-2 MG SL SUBL
1.0000 | SUBLINGUAL_TABLET | Freq: Two times a day (BID) | SUBLINGUAL | 1 refills | Status: DC
  Filled 2021-08-10: qty 28, 14d supply, fill #0
  Filled 2021-09-02: qty 28, 14d supply, fill #1

## 2021-08-16 ENCOUNTER — Other Ambulatory Visit (HOSPITAL_COMMUNITY): Payer: Self-pay

## 2021-08-16 ENCOUNTER — Encounter: Payer: Self-pay | Admitting: Internal Medicine

## 2021-08-16 ENCOUNTER — Other Ambulatory Visit: Payer: Self-pay

## 2021-08-16 ENCOUNTER — Ambulatory Visit (INDEPENDENT_AMBULATORY_CARE_PROVIDER_SITE_OTHER): Payer: Self-pay | Admitting: Internal Medicine

## 2021-08-16 VITALS — BP 137/85 | HR 95 | Temp 98.3°F | Resp 24 | Ht 66.0 in | Wt 177.9 lb

## 2021-08-16 DIAGNOSIS — E1110 Type 2 diabetes mellitus with ketoacidosis without coma: Secondary | ICD-10-CM

## 2021-08-16 DIAGNOSIS — G629 Polyneuropathy, unspecified: Secondary | ICD-10-CM

## 2021-08-16 DIAGNOSIS — F1110 Opioid abuse, uncomplicated: Secondary | ICD-10-CM

## 2021-08-16 LAB — POCT GLYCOSYLATED HEMOGLOBIN (HGB A1C): Hemoglobin A1C: 12.8 % — AB (ref 4.0–5.6)

## 2021-08-16 LAB — GLUCOSE, CAPILLARY: Glucose-Capillary: 346 mg/dL — ABNORMAL HIGH (ref 70–99)

## 2021-08-16 MED ORDER — GABAPENTIN 100 MG PO CAPS
100.0000 mg | ORAL_CAPSULE | Freq: Three times a day (TID) | ORAL | 0 refills | Status: DC
  Filled 2021-08-16 – 2021-09-28 (×3): qty 90, 30d supply, fill #0

## 2021-08-16 MED ORDER — INSULIN GLARGINE 100 UNIT/ML ~~LOC~~ SOLN
20.0000 [IU] | Freq: Every day | SUBCUTANEOUS | 3 refills | Status: DC
  Filled 2021-08-16: qty 10, 50d supply, fill #0

## 2021-08-16 MED ORDER — DULOXETINE HCL 60 MG PO CPEP
60.0000 mg | ORAL_CAPSULE | Freq: Every day | ORAL | 3 refills | Status: DC
  Filled 2021-08-16 – 2021-10-23 (×2): qty 30, 30d supply, fill #0
  Filled 2022-01-01: qty 30, 30d supply, fill #1

## 2021-08-16 MED ORDER — INSULIN DEGLUDEC 100 UNIT/ML ~~LOC~~ SOPN
20.0000 [IU] | PEN_INJECTOR | Freq: Every day | SUBCUTANEOUS | 0 refills | Status: DC
  Filled 2021-08-16: qty 6, 30d supply, fill #0

## 2021-08-16 MED ORDER — NOVOLOG FLEXPEN 100 UNIT/ML ~~LOC~~ SOPN
5.0000 [IU] | PEN_INJECTOR | Freq: Three times a day (TID) | SUBCUTANEOUS | 3 refills | Status: DC
  Filled 2021-08-16 – 2021-09-02 (×2): qty 3, 20d supply, fill #0

## 2021-08-16 NOTE — Assessment & Plan Note (Addendum)
Patient with a history of poorly controlled diabetes with prior HbA1c >14. Patient reports that he has been out of his medications but has been taking his mother's Basaglar 20U daily. He reports checking his blood glucose twice daily and notes that it ranges mostly in 200-250s. POC glucose 346 during visit.  HbA1c improved to 12.8. Patient previously advised for Tresiba 20U daily, Ozempic 0.25mg  weekly and metformin XR 500mg  daily. However, has not been able to tolerate the metformin due to GI side effects.  Patient was supposed to receive Ozempic and Tresiba via patient assistance - has not received this yet. Will touch base with clinical pharmacy regarding this. is not covered under IM program - will switch to lantus for basal insulin.   Plan: Lantus 20U daily Start Novolog 5U TID with meals  Urine microalbumin/cr ratio and BMP today Refer to clinical pharmacy for ozempic assistance F/u in 2 weeks for glucose monitoring Would benefit from CGM monitor once insured

## 2021-08-16 NOTE — Assessment & Plan Note (Signed)
Patient is on stable dose of suboxone 1 tablet twice daily. He denies any cravings or relapses. Denies any other drug use at this time.  Prior Utox positive for cocaine and THC. PDMP reviewed and appropriate.  Plan: Repeat Utox today Continue suboxone 8-2mg  twice daily

## 2021-08-16 NOTE — Patient Instructions (Addendum)
Mr [name],  It was a pleasure seeing you in clinic. Today we discussed:   Diabetes:  Prescription for Lantus 20U daily was sent to the pharmacy. Please take this daily as prescribed. Continue to check your blood sugars at home regularly. We may need to start meal time insulin as well. I will call you to further discuss this.  I will touch base with our clinical pharmacists regarding your Ozempic.   Neuropathy  Please take gabapentin and duloxetine as prescribed.  We are checking lab work today. I will call you with any abnormal results.    If you have any questions or concerns, please call our clinic at [phone] between 9am-5pm and after hours call [phone] and ask for the internal medicine resident on call. If you feel you are having a medical emergency please call 911.   Thank you, we look forward to helping you remain healthy!

## 2021-08-16 NOTE — Assessment & Plan Note (Signed)
Mr [name] reports ongoing bilateral lower extremity neuropathy. He reports continuing to have this despite suboxone therapy. He was previously prescribed gabapentin and duloxetine for his neuropathy; however, reports not currently taking this. Discussed with patient that suboxone is not prescribed for neuropathic pain and recommended treatment for that is gabapentin and duloxetine. Patient expresses understanding.  Plan:  Refilled gabapentin 100mg  tid  Refilled duloxetine 60mg  daily

## 2021-08-16 NOTE — Progress Notes (Signed)
   CC: diabetes follow up  HPI:  [name] is a 30 y.o. male with PMHx as stated below presenting for diabetes follow up. Please see problem based charting for complete assessment and plan.  Past Medical History:  Diagnosis Date   Asthma    HTN (hypertension) 07/25/2017   Lisinopril 40mg  Daily   Hyperglycemia 09/24/2017   Type II diabetes mellitus (HCC)    "dx'd 06/2017"   Review of Systems:  Negative except as stated in HPI.  Physical Exam:  Vitals:   08/16/21 1514  BP: 137/85  Pulse: 95  Resp: (!) 24  Temp: 98.3 F (36.8 C)  TempSrc: Oral  SpO2: 100%  Weight: 177 lb 14.4 oz (80.7 kg)  Height: 5\' 6"  (1.676 m)   Physical Exam  Constitutional: Appears well-developed and well-nourished. No distress.  HENT: Normocephalic and atraumatic, moist mucous membranes Cardiovascular: Normal rate, regular rhythm, S1 and S2 present, no murmurs, rubs, gallops.  Distal pulses intact Respiratory:  Lungs are clear to auscultation bilaterally. Musculoskeletal: Normal bulk and tone.   Skin: Warm and dry.  No rash, erythema, lesions noted. Psychiatric: Normal mood and affect.   Assessment & Plan:   See Encounters Tab for problem based charting.  Patient discussed with Dr. 08/18/21

## 2021-08-17 ENCOUNTER — Other Ambulatory Visit: Payer: Self-pay | Admitting: Internal Medicine

## 2021-08-17 ENCOUNTER — Other Ambulatory Visit (HOSPITAL_COMMUNITY): Payer: Self-pay

## 2021-08-17 DIAGNOSIS — E1110 Type 2 diabetes mellitus with ketoacidosis without coma: Secondary | ICD-10-CM

## 2021-08-17 LAB — BMP8+ANION GAP
Anion Gap: 14 mmol/L (ref 10.0–18.0)
BUN/Creatinine Ratio: 13 (ref 9–20)
BUN: 9 mg/dL (ref 6–20)
CO2: 23 mmol/L (ref 20–29)
Calcium: 8.9 mg/dL (ref 8.7–10.2)
Chloride: 98 mmol/L (ref 96–106)
Creatinine, Ser: 0.72 mg/dL — ABNORMAL LOW (ref 0.76–1.27)
Glucose: 371 mg/dL — ABNORMAL HIGH (ref 70–99)
Potassium: 4.2 mmol/L (ref 3.5–5.2)
Sodium: 135 mmol/L (ref 134–144)
eGFR: 128 mL/min/{1.73_m2} (ref >59)

## 2021-08-17 LAB — MICROALBUMIN / CREATININE URINE RATIO
Creatinine, Urine: 38.9 mg/dL
Microalb/Creat Ratio: <8 mg/g creat (ref 0–29)
Microalbumin, Urine: <3 ug/mL

## 2021-08-17 MED ORDER — INSULIN GLARGINE SOLOSTAR 100 UNIT/ML ~~LOC~~ SOPN
20.0000 [IU] | PEN_INJECTOR | Freq: Every day | SUBCUTANEOUS | 11 refills | Status: DC
  Filled 2021-08-17 – 2021-09-02 (×2): qty 6, 30d supply, fill #0

## 2021-08-17 MED ORDER — INSULIN PEN NEEDLE 32G X 4 MM MISC
1.0000 | Freq: Every day | 7 refills | Status: DC
  Filled 2021-08-17: qty 100, 30d supply, fill #0
  Filled 2021-09-02: qty 100, 25d supply, fill #0

## 2021-08-17 NOTE — Addendum Note (Signed)
Addended by: Eliezer Bottom on: 08/17/2021 08:31 AM   Modules accepted: Orders

## 2021-08-17 NOTE — Progress Notes (Signed)
Internal Medicine Clinic Attending ° °Case discussed with Dr. Aslam  At the time of the visit.  We reviewed the resident’s history and exam and pertinent patient test results.  I agree with the assessment, diagnosis, and plan of care documented in the resident’s note.  °

## 2021-08-23 LAB — TOXASSURE SELECT,+ANTIDEPR,UR

## 2021-08-25 ENCOUNTER — Other Ambulatory Visit (HOSPITAL_COMMUNITY): Payer: Self-pay

## 2021-09-02 ENCOUNTER — Other Ambulatory Visit (HOSPITAL_COMMUNITY): Payer: Self-pay

## 2021-09-02 ENCOUNTER — Other Ambulatory Visit: Payer: Self-pay | Admitting: Student

## 2021-09-02 DIAGNOSIS — E1110 Type 2 diabetes mellitus with ketoacidosis without coma: Secondary | ICD-10-CM

## 2021-09-02 MED ORDER — INSULIN GLARGINE SOLOSTAR 100 UNIT/ML ~~LOC~~ SOPN
20.0000 [IU] | PEN_INJECTOR | Freq: Every day | SUBCUTANEOUS | 11 refills | Status: DC
  Filled 2021-09-02: qty 6, 30d supply, fill #0

## 2021-09-02 NOTE — Progress Notes (Signed)
Basaglar was recommended by Dr. Tresa Endo. Unfortunately, Mariella Saa is not covered under IM program $4 list. Will refill Lantus 20 units daily.

## 2021-09-07 ENCOUNTER — Encounter: Payer: Self-pay | Admitting: Internal Medicine

## 2021-09-12 ENCOUNTER — Encounter: Payer: Self-pay | Admitting: Internal Medicine

## 2021-09-13 ENCOUNTER — Other Ambulatory Visit (HOSPITAL_COMMUNITY): Payer: Self-pay

## 2021-09-16 ENCOUNTER — Other Ambulatory Visit (HOSPITAL_COMMUNITY): Payer: Self-pay

## 2021-09-16 ENCOUNTER — Other Ambulatory Visit: Payer: Self-pay | Admitting: Student in an Organized Health Care Education/Training Program

## 2021-09-16 DIAGNOSIS — F1110 Opioid abuse, uncomplicated: Secondary | ICD-10-CM

## 2021-09-19 ENCOUNTER — Other Ambulatory Visit (HOSPITAL_COMMUNITY): Payer: Self-pay

## 2021-09-19 MED ORDER — BUPRENORPHINE HCL-NALOXONE HCL 8-2 MG SL SUBL
1.0000 | SUBLINGUAL_TABLET | Freq: Two times a day (BID) | SUBLINGUAL | 1 refills | Status: DC
  Filled 2021-09-19: qty 28, 14d supply, fill #0
  Filled 2021-09-28 – 2021-09-29 (×2): qty 28, 14d supply, fill #1

## 2021-09-29 ENCOUNTER — Other Ambulatory Visit (HOSPITAL_COMMUNITY): Payer: Self-pay

## 2021-09-29 NOTE — Progress Notes (Signed)
SHREDDED APP, NEVER REC'D PROOF OF INCOME. MORE THAN 56 DAYS OLD.

## 2021-09-30 ENCOUNTER — Other Ambulatory Visit (HOSPITAL_COMMUNITY): Payer: Self-pay

## 2021-10-12 ENCOUNTER — Other Ambulatory Visit: Payer: Self-pay | Admitting: Student in an Organized Health Care Education/Training Program

## 2021-10-12 DIAGNOSIS — F1110 Opioid abuse, uncomplicated: Secondary | ICD-10-CM

## 2021-10-13 ENCOUNTER — Other Ambulatory Visit (HOSPITAL_COMMUNITY): Payer: Self-pay

## 2021-10-13 MED ORDER — BUPRENORPHINE HCL-NALOXONE HCL 8-2 MG SL SUBL
1.0000 | SUBLINGUAL_TABLET | Freq: Two times a day (BID) | SUBLINGUAL | 1 refills | Status: DC
  Filled 2021-10-13: qty 28, 14d supply, fill #0

## 2021-10-14 ENCOUNTER — Other Ambulatory Visit (HOSPITAL_COMMUNITY): Payer: Self-pay

## 2021-10-19 ENCOUNTER — Other Ambulatory Visit (HOSPITAL_COMMUNITY): Payer: Self-pay

## 2021-10-19 ENCOUNTER — Ambulatory Visit (INDEPENDENT_AMBULATORY_CARE_PROVIDER_SITE_OTHER): Payer: Self-pay | Admitting: Student

## 2021-10-19 ENCOUNTER — Encounter: Payer: Self-pay | Admitting: Student

## 2021-10-19 VITALS — BP 134/90 | HR 92 | Temp 98.7°F | Ht 66.0 in | Wt 172.5 lb

## 2021-10-19 DIAGNOSIS — F1110 Opioid abuse, uncomplicated: Secondary | ICD-10-CM

## 2021-10-19 DIAGNOSIS — L84 Corns and callosities: Secondary | ICD-10-CM

## 2021-10-19 DIAGNOSIS — E1110 Type 2 diabetes mellitus with ketoacidosis without coma: Secondary | ICD-10-CM

## 2021-10-19 MED ORDER — INSULIN PEN NEEDLE 32G X 4 MM MISC
1.0000 | Freq: Every day | 7 refills | Status: DC
  Filled 2021-10-19: qty 100, 25d supply, fill #0

## 2021-10-19 MED ORDER — NOVOLOG FLEXPEN 100 UNIT/ML ~~LOC~~ SOPN
5.0000 [IU] | PEN_INJECTOR | Freq: Three times a day (TID) | SUBCUTANEOUS | 3 refills | Status: DC
  Filled 2021-10-19: qty 3, 20d supply, fill #0
  Filled 2021-11-16: qty 3, 20d supply, fill #1

## 2021-10-19 MED ORDER — BUPRENORPHINE HCL-NALOXONE HCL 8-2 MG SL SUBL
1.0000 | SUBLINGUAL_TABLET | Freq: Three times a day (TID) | SUBLINGUAL | 0 refills | Status: DC
  Filled 2021-10-19 – 2021-10-21 (×3): qty 42, 14d supply, fill #0

## 2021-10-19 MED ORDER — INSULIN GLARGINE SOLOSTAR 100 UNIT/ML ~~LOC~~ SOPN
20.0000 [IU] | PEN_INJECTOR | Freq: Every day | SUBCUTANEOUS | 11 refills | Status: DC
  Filled 2021-10-19: qty 6, 30d supply, fill #0
  Filled 2021-11-16: qty 6, 30d supply, fill #1

## 2021-10-19 NOTE — Assessment & Plan Note (Addendum)
Patient endorses pain at his calluses.  He started noticing this callus about a few months ago.  He tried to shave them off which caused some bleeding.  He denies any discharge from the wound.  He reports wearing steel boots at work and walking on concrete all day.  States that there are not a lot of support inside his boots.  He denies fever, chills, discharge, swelling or erythema.  Physical exam reveals 2 calluses seen in the medial side of the base of big toe bilaterally.  Another callus seen in the bottom of the foot at the base of left fifth toe.  No open wounds seen.  No purulent discharge.  No erythema or warmth to touch.  Dorsalis pedis pulses palpated bilaterally.  Mild tenderness to palpation at his calluses.  See photo in media  Assessment and plan There is no evidence of active infection at this time.  Advised patient to use supportive shoe insole.  He can also obtain callus support patch over-the-counter.  He states that he will have insurance in 3 weeks.  We will place a referral for podiatry at that time.  Advised patient to call us or go to the ED if he noticed any purulent discharge, swelling, erythema or fever.  -Return in 2 weeks

## 2021-10-19 NOTE — Progress Notes (Signed)
° °  CC: foot pain   HPI:  Mr.Cordelro Klar is a 30 y.o. with past medical history of uncontrolled diabetes who presented to the clinic for pain of his calluses.  Please see problem based charting for detail  Past Medical History:  Diagnosis Date   Asthma    HTN (hypertension) 07/25/2017   Lisinopril 40mg  Daily   Hyperglycemia 09/24/2017   Type II diabetes mellitus (HCC)    "dx'd 06/2017"   Review of Systems:  per HPI  Physical Exam:  Vitals:   10/19/21 1007  BP: 134/90  Pulse: 92  Temp: 98.7 F (37.1 C)  TempSrc: Oral  SpO2: 100%  Weight: 172 lb 8 oz (78.2 kg)  Height: 5\' 6"  (1.676 m)   Physical Exam Constitutional:      General: He is not in acute distress.    Appearance: He is not ill-appearing.  HENT:     Head: Normocephalic.  Eyes:     General:        Right eye: No discharge.        Left eye: No discharge.     Conjunctiva/sclera: Conjunctivae normal.  Cardiovascular:     Rate and Rhythm: Normal rate and regular rhythm.  Pulmonary:     Effort: Pulmonary effort is normal. No respiratory distress.     Breath sounds: Normal breath sounds. No wheezing.  Musculoskeletal:     Comments: 2 calluses seen in the medial side of the base of big toe bilaterally.  Another callus seen in the bottom of the foot at the base of left fifth toe.  No open wounds seen.  No purulent discharge.  No erythema or warmth to touch.  Dorsalis pedis pulses palpated bilaterally.  Mild tenderness to palpation at his calluses.  Neurological:     General: No focal deficit present.     Mental Status: He is alert and oriented to person, place, and time.  Psychiatric:        Mood and Affect: Mood normal.        Behavior: Behavior normal.           Assessment & Plan:   See Encounters Tab for problem based charting.  Patient discussed with Dr. 10/21/21

## 2021-10-19 NOTE — Patient Instructions (Signed)
Mr. Arquette,  It was a pleasure seeing you in the clinic today.  Here is a summary of what we talked about:  1.  Foot pain: We will place a referral podiatry after you obtain your insurance.  In the meantime, you can try supportive shoe insole inside your boots.  Can also get calluses support patch over-the-counter to prevent friction.  Please call us or go to the ED if you notice any purulent discharge, swelling, or redness of your foot.  2.  Diabetes: I will refill your Lantus and NovoLog.  -Stop using Basaglar -Please inject 20 units of Lantus in the morning.  Please check your blood sugar first thing in the morning every day. -Please inject 5 units of NovoLog with meal.  Please check your blood sugar 30 minutes before meals then give the NovoLog. -Please bring your glucometer back in 2 weeks.  3.  We will increase Suboxone to 3 times daily.  Please cut back on marijuana or cocaine.  Return in 2 weeks  Take care,  Dr. Cyndie Chime

## 2021-10-19 NOTE — Assessment & Plan Note (Signed)
Patient with history of uncontrolled diabetes.  A1c was 12.82 months ago.  Patient however is not following his regimen.  He is currently injecting 20 units of Basaglar in the morning and 15 units at nighttime.  He has not been using NovoLog or Ozempic.  He checks his blood sugar randomly throughout the day.  He denies any lows or symptoms of hypoglycemia.  I emphasized the importance of blood sugar controlled to prevent complication.  Patient verbalizes understanding.  -Stop Basaglar and refill Lantus/NovoLog -Start Lantus 20 units in the morning -Start NovoLog 5 units with meals 3 times daily -Will hold Ozempic for now.  Will reach out to Dr. Nicholaus Bloom about coverage -Advised patient to check his blood sugar first thing in the morning and 3 times with meals -He will bring his glucometer back in 2 weeks -Continue follow-up with Dr. Nicholaus Bloom

## 2021-10-19 NOTE — Assessment & Plan Note (Signed)
Patient reports taking Suboxone 3 times daily instead of twice daily.  He endorses increasing craving, body pain and nausea.  He denies anxiety or tremors.  He denies any other opioid products but endorses to marijuana and cocaine use.  -Increase Suboxone 8-2 mg tablet to 3 times daily -Tox assure today -Follow-up in 2 weeks

## 2021-10-20 NOTE — Progress Notes (Signed)
Internal Medicine Clinic Attending  Case discussed with Dr. Nguyen  at the time of the visit.  We reviewed the resident's history and exam and pertinent patient test results.  I agree with the assessment, diagnosis, and plan of care documented in the resident's note.  

## 2021-10-21 ENCOUNTER — Other Ambulatory Visit (HOSPITAL_COMMUNITY): Payer: Self-pay

## 2021-10-23 ENCOUNTER — Other Ambulatory Visit: Payer: Self-pay | Admitting: Internal Medicine

## 2021-10-23 DIAGNOSIS — E1110 Type 2 diabetes mellitus with ketoacidosis without coma: Secondary | ICD-10-CM

## 2021-10-24 ENCOUNTER — Other Ambulatory Visit (HOSPITAL_COMMUNITY): Payer: Self-pay

## 2021-10-25 ENCOUNTER — Other Ambulatory Visit (HOSPITAL_COMMUNITY): Payer: Self-pay

## 2021-10-26 ENCOUNTER — Other Ambulatory Visit: Payer: Self-pay | Admitting: Internal Medicine

## 2021-10-26 ENCOUNTER — Other Ambulatory Visit (HOSPITAL_COMMUNITY): Payer: Self-pay

## 2021-10-26 DIAGNOSIS — E1110 Type 2 diabetes mellitus with ketoacidosis without coma: Secondary | ICD-10-CM

## 2021-10-27 ENCOUNTER — Other Ambulatory Visit (HOSPITAL_COMMUNITY): Payer: Self-pay

## 2021-10-27 LAB — TOXASSURE SELECT,+ANTIDEPR,UR

## 2021-10-27 MED ORDER — GABAPENTIN 100 MG PO CAPS
100.0000 mg | ORAL_CAPSULE | Freq: Three times a day (TID) | ORAL | 3 refills | Status: DC
  Filled 2021-10-27: qty 90, 30d supply, fill #0
  Filled 2021-12-03: qty 90, 30d supply, fill #1
  Filled 2022-01-01: qty 90, 30d supply, fill #2

## 2021-10-31 ENCOUNTER — Other Ambulatory Visit (HOSPITAL_COMMUNITY): Payer: Self-pay

## 2021-11-03 ENCOUNTER — Other Ambulatory Visit: Payer: Self-pay | Admitting: Internal Medicine

## 2021-11-03 DIAGNOSIS — F1110 Opioid abuse, uncomplicated: Secondary | ICD-10-CM

## 2021-11-04 ENCOUNTER — Other Ambulatory Visit (HOSPITAL_COMMUNITY): Payer: Self-pay

## 2021-11-04 MED ORDER — BUPRENORPHINE HCL-NALOXONE HCL 8-2 MG SL SUBL
1.0000 | SUBLINGUAL_TABLET | Freq: Three times a day (TID) | SUBLINGUAL | 0 refills | Status: DC
  Filled 2021-11-04: qty 42, 14d supply, fill #0

## 2021-11-16 ENCOUNTER — Other Ambulatory Visit: Payer: Self-pay

## 2021-11-16 ENCOUNTER — Other Ambulatory Visit (HOSPITAL_COMMUNITY): Payer: Self-pay

## 2021-11-16 ENCOUNTER — Other Ambulatory Visit: Payer: Self-pay | Admitting: *Deleted

## 2021-11-16 DIAGNOSIS — F1110 Opioid abuse, uncomplicated: Secondary | ICD-10-CM

## 2021-11-16 MED ORDER — BUPRENORPHINE HCL-NALOXONE HCL 8-2 MG SL SUBL
1.0000 | SUBLINGUAL_TABLET | Freq: Three times a day (TID) | SUBLINGUAL | 0 refills | Status: DC
  Filled 2021-11-16: qty 42, 14d supply, fill #0

## 2021-11-16 NOTE — Telephone Encounter (Signed)
Last OV with Dr Cyndie Chime 10/19/21.

## 2021-11-28 ENCOUNTER — Other Ambulatory Visit: Payer: Self-pay | Admitting: Student in an Organized Health Care Education/Training Program

## 2021-11-28 DIAGNOSIS — F1110 Opioid abuse, uncomplicated: Secondary | ICD-10-CM

## 2021-11-29 ENCOUNTER — Encounter: Payer: Self-pay | Admitting: *Deleted

## 2021-11-29 ENCOUNTER — Other Ambulatory Visit (HOSPITAL_COMMUNITY): Payer: Self-pay

## 2021-11-29 NOTE — Telephone Encounter (Signed)
Last OV 10/19/21 with PCP.

## 2021-11-29 NOTE — Telephone Encounter (Signed)
Called pt to schedule a f/u appt per Dr Antony Contras - no answer; left message to call the office. I will ask front office to call pt also.

## 2021-11-29 NOTE — Telephone Encounter (Signed)
Patient is overdue for follow up. Please have him schedule an appointment before I send in a refill. He has refilled twice now without scheduling or being seen. This will be the last refill without an appointment. Thanks.

## 2021-12-03 ENCOUNTER — Other Ambulatory Visit: Payer: Self-pay | Admitting: Student in an Organized Health Care Education/Training Program

## 2021-12-03 DIAGNOSIS — F1110 Opioid abuse, uncomplicated: Secondary | ICD-10-CM

## 2021-12-05 ENCOUNTER — Ambulatory Visit (INDEPENDENT_AMBULATORY_CARE_PROVIDER_SITE_OTHER): Payer: Self-pay | Admitting: Student

## 2021-12-05 ENCOUNTER — Other Ambulatory Visit (HOSPITAL_COMMUNITY): Payer: Self-pay

## 2021-12-05 ENCOUNTER — Encounter: Payer: Self-pay | Admitting: Student

## 2021-12-05 VITALS — BP 135/85 | HR 94 | Temp 97.8°F | Wt 172.4 lb

## 2021-12-05 DIAGNOSIS — F1110 Opioid abuse, uncomplicated: Secondary | ICD-10-CM

## 2021-12-05 DIAGNOSIS — E1110 Type 2 diabetes mellitus with ketoacidosis without coma: Secondary | ICD-10-CM

## 2021-12-05 DIAGNOSIS — L84 Corns and callosities: Secondary | ICD-10-CM

## 2021-12-05 LAB — GLUCOSE, CAPILLARY: Glucose-Capillary: 362 mg/dL — ABNORMAL HIGH (ref 70–99)

## 2021-12-05 LAB — POCT GLYCOSYLATED HEMOGLOBIN (HGB A1C): Hemoglobin A1C: 11.7 % — AB (ref 4.0–5.6)

## 2021-12-05 MED ORDER — INSULIN GLARGINE SOLOSTAR 100 UNIT/ML ~~LOC~~ SOPN
23.0000 [IU] | PEN_INJECTOR | Freq: Every day | SUBCUTANEOUS | 11 refills | Status: DC
  Filled 2021-12-05: qty 15, 65d supply, fill #0

## 2021-12-05 MED ORDER — NOVOLOG FLEXPEN 100 UNIT/ML ~~LOC~~ SOPN
7.0000 [IU] | PEN_INJECTOR | Freq: Three times a day (TID) | SUBCUTANEOUS | 3 refills | Status: DC
  Filled 2021-12-05: qty 6, 28d supply, fill #0

## 2021-12-05 MED ORDER — BUPRENORPHINE HCL-NALOXONE HCL 8-2 MG SL SUBL
1.0000 | SUBLINGUAL_TABLET | Freq: Three times a day (TID) | SUBLINGUAL | 0 refills | Status: DC
  Filled 2021-12-05: qty 90, 30d supply, fill #0

## 2021-12-05 NOTE — Assessment & Plan Note (Signed)
Patient with calluses on medial side of big toes bilaterally.  Appear relatively unchanged since prior visit with Dr. Cyndie Chime.  Denies any systemic symptoms.  On exam, no discharge, erythema, increased warmth, open wounds noted.  DP pulses 2+ bilaterally.  Still having some mild tenderness to palpation at site of calluses.  He has not yet obtained insoles for support.  Patient notes being on his feet throughout the day and thus discussed utility of insoles to help redistribute pressure and prevent worsening of these calluses.  He confirms understanding.  He remains uninsured at this time and thus currently unable to referred to podiatry.  He is trying to obtain an orange card so can consider this once that is approved.  Plan: -Insoles for pressure redistribution -Daily feet check -Referral to podiatry once orange card is approved

## 2021-12-05 NOTE — Patient Instructions (Signed)
[name],  It was a pleasure seeing you in the clinic today.   Your sugar levels are still very high. I have increased your lantus to 23 units once day. I also increased your meal-time insulin to 7 units with each meal. Please make sure to find your meter. It is very important to monitor your sugar levels at home so that we can figure out how to better control your sugar levels. I have refilled your suboxone. Please come back to see [place] in 1 month.  Please call our clinic at [phone] if you have any questions or concerns. The best time to call is Monday-Friday from 9am-4pm, but there is someone available 24/7 at the same number. If you need medication refills, please notify your pharmacy one week in advance and they will send [place] a request.   Thank you for letting us take part in your care. We look forward to seeing you next time!

## 2021-12-05 NOTE — Assessment & Plan Note (Addendum)
Patient with hx of OUD, currently taking Suboxone 3 times daily.  States that his cravings have been well controlled since last visit.  Denies any recent relapses.  PDMP reviewed and appropriate.  States that he does use marijuana and cocaine intermittently.  Counseled on cocaine cessation given concurrent comorbidities and history of multiple episodes of ketosis.  He confirms understanding.  We will prescribe 1 month course of Suboxone and have him follow-up in 1 month for both this and for diabetes follow-up.  Plan: -30-day course of Suboxone 8-2mg  3 times daily -Tox assure today -Continue encouraging cocaine cessation at future visits. -Follow-up in 1 month

## 2021-12-05 NOTE — Assessment & Plan Note (Signed)
Patient with history of uncontrolled diabetes mellitus, currently on Lantus 20 units daily and NovoLog 5 units 3 times daily with meals.  Last A1c in October 2022 was 12.8%.  A1c today 11.7%.  He confirms remaining adherent with his basal bolus dosing.  Does note that he needs to improve on his diet, also mentioning that he needs to do better with cutting back on soft drinks which I encouraged.  On chart review, patient was diagnosed with diabetes in 2018 and has had 3 prior admissions for DKA.  Spoke with patient's mom as well, who notes that there is a strong family history of diabetes with a couple of members having been diagnosed at a very young age while others were diagnosed later in life.  Given history, I do suspect possibility of type 1 diabetes in this patient.  Unfortunately, he is uninsured and thus unable to afford antibody testing at this time.  Of note, patient was unable to bring in his glucometer as he was unable to find it before coming in.  Discussed importance of using glucometer to measure sugars daily and to bring to next visit so that we can evaluate and make medication adjustments accordingly.  At this time, we will increase the Lantus to 23 units daily and increase NovoLog to 7 units 3 times daily with meals.  Plan to have him follow-up in 1 month with glucometer.  Plan: -Increase Lantus to 23 units daily -Increase NovoLog to 7 units 3 times daily with meals -Please make sure to evaluate glucometer readings at next follow-up visit in 1 month -Refer to ophthalmology for annual eye exam once insurance coverage is obtained

## 2021-12-05 NOTE — Progress Notes (Signed)
° °  CC: f/u DM, OUD  HPI:  [name] is a 31 y.o. male with history listed below presenting to the [facility] for f/u of DM and OUD. Please see individualized problem based charting for full HPI.  Past Medical History:  Diagnosis Date   Asthma    HTN (hypertension) 07/25/2017   Lisinopril 40mg  Daily   Hyperglycemia 09/24/2017   Type II diabetes mellitus (HCC)    "dx'd 06/2017"    Review of Systems:  Negative aside from that listed in individualized problem based charting.  Physical Exam:  Vitals:   12/05/21 1012  BP: 135/85  Pulse: 94  Temp: 97.8 F (36.6 C)  TempSrc: Oral  SpO2: 99%  Weight: 172 lb 6.4 oz (78.2 kg)   Physical Exam Constitutional:      Appearance: Normal appearance. He is not ill-appearing.  HENT:     Mouth/Throat:     Mouth: Mucous membranes are moist.     Pharynx: Oropharynx is clear. No oropharyngeal exudate.  Eyes:     Extraocular Movements: Extraocular movements intact.     Conjunctiva/sclera: Conjunctivae normal.     Pupils: Pupils are equal, round, and reactive to light.  Cardiovascular:     Rate and Rhythm: Normal rate and regular rhythm.     Pulses: Normal pulses.     Heart sounds: Normal heart sounds. No murmur heard.   No gallop.     Comments: 2+ DP pulses bilaterally Pulmonary:     Effort: Pulmonary effort is normal.     Breath sounds: Normal breath sounds. No wheezing, rhonchi or rales.  Abdominal:     General: Bowel sounds are normal. There is no distension.     Palpations: Abdomen is soft.     Tenderness: There is no abdominal tenderness.  Musculoskeletal:        General: No swelling. Normal range of motion.     Cervical back: Normal range of motion.  Skin:    General: Skin is warm and dry.     Comments: Calluses noted on medial side of big toes bilaterally.  No discharge, increased warmth, erythema, or open wounds noted.  Does have mild tenderness to palpation on side of calluses.  Neurological:     Mental Status: He is  alert and oriented to person, place, and time.     Comments: Sensation is diminished on distal toes bilaterally.  Psychiatric:        Mood and Affect: Mood normal.        Behavior: Behavior normal.     Assessment & Plan:   See Encounters Tab for problem based charting.  Patient discussed with Dr. 02/02/22

## 2021-12-06 NOTE — Progress Notes (Signed)
Internal Medicine Clinic Attending  Case discussed with Dr. Jinwala  At the time of the visit.  We reviewed the resident's history and exam and pertinent patient test results.  I agree with the assessment, diagnosis, and plan of care documented in the resident's note.  

## 2021-12-12 LAB — TOXASSURE SELECT,+ANTIDEPR,UR

## 2022-01-01 ENCOUNTER — Other Ambulatory Visit: Payer: Self-pay | Admitting: Student

## 2022-01-01 DIAGNOSIS — F1110 Opioid abuse, uncomplicated: Secondary | ICD-10-CM

## 2022-01-02 ENCOUNTER — Encounter: Payer: Self-pay | Admitting: Student

## 2022-01-02 ENCOUNTER — Other Ambulatory Visit (HOSPITAL_COMMUNITY): Payer: Self-pay

## 2022-01-03 ENCOUNTER — Other Ambulatory Visit (HOSPITAL_COMMUNITY): Payer: Self-pay

## 2022-01-03 ENCOUNTER — Ambulatory Visit (INDEPENDENT_AMBULATORY_CARE_PROVIDER_SITE_OTHER): Payer: Self-pay | Admitting: Internal Medicine

## 2022-01-03 DIAGNOSIS — E1110 Type 2 diabetes mellitus with ketoacidosis without coma: Secondary | ICD-10-CM

## 2022-01-03 DIAGNOSIS — E109 Type 1 diabetes mellitus without complications: Secondary | ICD-10-CM

## 2022-01-03 DIAGNOSIS — E1142 Type 2 diabetes mellitus with diabetic polyneuropathy: Secondary | ICD-10-CM

## 2022-01-03 DIAGNOSIS — G629 Polyneuropathy, unspecified: Secondary | ICD-10-CM

## 2022-01-03 DIAGNOSIS — F1110 Opioid abuse, uncomplicated: Secondary | ICD-10-CM

## 2022-01-03 MED ORDER — GABAPENTIN 100 MG PO CAPS
100.0000 mg | ORAL_CAPSULE | Freq: Three times a day (TID) | ORAL | 3 refills | Status: DC
  Filled 2022-01-03: qty 90, 30d supply, fill #0
  Filled 2022-01-27 – 2022-02-20 (×2): qty 90, 30d supply, fill #1
  Filled 2022-04-26 – 2022-06-22 (×2): qty 90, 30d supply, fill #2

## 2022-01-03 MED ORDER — DULOXETINE HCL 60 MG PO CPEP
60.0000 mg | ORAL_CAPSULE | Freq: Every day | ORAL | 3 refills | Status: DC
  Filled 2022-01-03: qty 30, 30d supply, fill #0
  Filled 2022-02-20: qty 30, 30d supply, fill #1
  Filled 2022-04-26: qty 30, 30d supply, fill #2

## 2022-01-03 MED ORDER — TRUE METRIX BLOOD GLUCOSE TEST VI STRP
ORAL_STRIP | 12 refills | Status: DC
  Filled 2022-01-03: qty 50, 30d supply, fill #0

## 2022-01-03 MED ORDER — BUPRENORPHINE HCL-NALOXONE HCL 8-2 MG SL SUBL
1.0000 | SUBLINGUAL_TABLET | Freq: Three times a day (TID) | SUBLINGUAL | 0 refills | Status: AC
  Filled 2022-01-03: qty 42, 14d supply, fill #0
  Filled 2022-01-17: qty 42, 14d supply, fill #1

## 2022-01-03 NOTE — Progress Notes (Signed)
? ?CC: DM follow up and medication/suboxone refills  ? ?HPI: ? ?[name] is a 31 y.o. male with a PMHx stated below and presents today for stated above. Please see the Encounters tab for problem-based Assessment & Plan for additional details.  ? ?Past Medical History:  ?Diagnosis Date  ? Asthma   ? HTN (hypertension) 07/25/2017  ? Lisinopril 52m Daily  ? Hyperglycemia 09/24/2017  ? Type II diabetes mellitus ([place]   ? "dx'd 06/2017"  ? ? ?Current Outpatient Medications on File Prior to Visit  ?Medication Sig Dispense Refill  ? Blood Glucose Monitoring Suppl (TRUE METRIX METER) w/Device KIT USE AS DIRECTED 1 kit 0  ? buprenorphine-naloxone (SUBOXONE) 8-2 mg SUBL SL tablet Place 1 tablet under the tongue in the morning, at noon, and at bedtime. 90 tablet 0  ? DULoxetine (CYMBALTA) 60 MG capsule Take 1 capsule (60 mg total) by mouth daily. 90 capsule 3  ? gabapentin (NEURONTIN) 100 MG capsule Take 1 capsule (100 mg total) by mouth 3 (three) times daily. 270 capsule 3  ? glucose blood (TRUE METRIX BLOOD GLUCOSE TEST) test strip use daily as directed 100 each 0  ? insulin aspart (NOVOLOG FLEXPEN) 100 UNIT/ML FlexPen Inject 7 Units into the skin 3 (three) times daily with meals. 3 mL 3  ? Insulin Glargine Solostar (LANTUS) 100 UNIT/ML Solostar Pen Inject 23 Units into the skin daily. 15 mL 11  ? Insulin Pen Needle 32G X 4 MM MISC Use as directed with Lantus and Novolog. 100 each 7  ? Insulin Syringe-Needle U-100 30G X 5/16" 0.3 ML MISC Use in the morning and at bedtime. 100 each 0  ? TRUEplus Lancets 28G MISC use to check blood sugar daily 100 each 0  ? ?No current facility-administered medications on file prior to visit.  ? ? ?No family history on file. ? ?Social History  ? ?Socioeconomic History  ? Marital status: Single  ?  Spouse name: Not on file  ? Number of children: Not on file  ? Years of education: Not on file  ? Highest education level: Not on file  ?Occupational History  ? Not on file  ?Tobacco Use   ? Smoking status: Every Day  ?  Packs/day: 0.50  ?  Years: 12.00  ?  Pack years: 6.00  ?  Types: Cigarettes  ? Smokeless tobacco: Never  ? Tobacco comments:  ?  0.5 PPD  ?Vaping Use  ? Vaping Use: Never used  ?Substance and Sexual Activity  ? Alcohol use: Yes  ?  Comment: OCCASSIONAL  ? Drug use: Yes  ?  Types: Marijuana  ?  Comment: 10/17/2017 ~3 times per week  ? Sexual activity: Never  ?Other Topics Concern  ? Not on file  ?Social History Narrative  ? Not on file  ? ?Social Determinants of Health  ? ?Financial Resource Strain: Not on file  ?Food Insecurity: Not on file  ?Transportation Needs: Not on file  ?Physical Activity: Not on file  ?Stress: Not on file  ?Social Connections: Not on file  ?Intimate Partner Violence: Not on file  ? ? ?Review of Systems: ?ROS negative except for what is noted on the assessment and plan. ? ?Vitals:  ? 01/03/22 0915  ?BP: (!) 131/92  ?Pulse: 92  ?Temp: 98.3 ?F (36.8 ?C)  ?TempSrc: Oral  ?SpO2: 100%  ?Weight: 171 lb 9.6 oz (77.8 kg)  ?Height: '5\' 6"'  (1.676 m)  ? ? ? ?Physical Exam: ?Constitutional: alert, well-appearing, in NAD ?HENT:  normocephalic, atraumatic, mucous membranes moist ?Cardiovascular: RRR, no m/r/g, non-edematous bilateral LE ?Pulmonary/Chest: normal work of breathing on RA, LCTAB ?Abdominal: soft, non-tender to palpation, non-distended ?MSK: normal bulk and tone  ?Neurological: A&O x 3 and follows commands  ?Psych: normal behavior, normal affect  ? ?Assessment & Plan:  ? ?See Encounters Tab for problem based charting. ? ?Patient discussed with Dr. Philipp Ovens ? ?Lajean Manes, MD  ?Internal Medicine Resident, PGY-1 ?[facility]  ? ?

## 2022-01-03 NOTE — Assessment & Plan Note (Signed)
Pt with hx of OUD, currently taking Suboxone TID and here for refill today after receiving #90 on 2/6. States that his cravings are well controlled since last visit. Denies any relapses. Reports that he has not used cocaine since last visit 1 mo ago, and reports doing and feeling well. PDMP reviewed. Tox assure 1 mo ago appropriate- will hold off on it this visit.  ? ?-30-day course of Suboxone 8-2mg  3 times daily, #90 refilled.  ?-Continue encouraging cocaine cessation ?-Follow-up in 1 month ?

## 2022-01-03 NOTE — Assessment & Plan Note (Addendum)
Most recent A1c 11.7 from 1 mo ago. Home meter broken with no data retrievable; was able to set him up with another meter with Donna's help. Self reported home monitoring with readings ranging from 200-300. He reports no reading lower than 200. He did not feel any lows. Improving on current regimen, compared to previous CBGs in 300-400's. Diabetes not at goal on current regimen, and will require further up-titration. Reports good medication compliance. Tolerating medication without adverse effects. Counseled on the benefits of daily exercise, limiting processed foods and high sugar foods, and weight loss. No hypoglycemic episodes. Denies polydipsia, polyuria, weight loss, lethargy, blurry vision, and changes in sensation.  ? ?Increase Lantus from 23 to 26 units  ?Increase Novolog from 7 to 10 units  ?CTM CBG's and bring glucometer to next visit  ?Continue lifestyle modifications ? ?

## 2022-01-03 NOTE — Assessment & Plan Note (Signed)
Gabapentin and duloxetine refilled  ?

## 2022-01-03 NOTE — Patient Instructions (Signed)
Medications refilled ? ?Increase Lantus from 23 units to 26 units  ?Increase Novolog from 7 units to 10 units  ? ?Follow up in 1 month for diabetes and suboxone  ?

## 2022-01-07 NOTE — Progress Notes (Signed)
Internal Medicine Clinic Attending  Case discussed with Dr. Patel  At the time of the visit.  We reviewed the resident's history and exam and pertinent patient test results.  I agree with the assessment, diagnosis, and plan of care documented in the resident's note.  

## 2022-01-07 NOTE — Addendum Note (Signed)
Addended by: Burnell Blanks on: 01/07/2022 02:44 PM ? ? Modules accepted: Level of Service ? ?

## 2022-01-17 ENCOUNTER — Other Ambulatory Visit (HOSPITAL_COMMUNITY): Payer: Self-pay

## 2022-01-18 ENCOUNTER — Other Ambulatory Visit (HOSPITAL_COMMUNITY): Payer: Self-pay

## 2022-01-19 ENCOUNTER — Other Ambulatory Visit (HOSPITAL_COMMUNITY): Payer: Self-pay

## 2022-01-20 DIAGNOSIS — M79671 Pain in right foot: Secondary | ICD-10-CM | POA: Insufficient documentation

## 2022-01-20 DIAGNOSIS — Z5321 Procedure and treatment not carried out due to patient leaving prior to being seen by health care provider: Secondary | ICD-10-CM | POA: Insufficient documentation

## 2022-01-21 ENCOUNTER — Emergency Department (HOSPITAL_COMMUNITY): Payer: Self-pay

## 2022-01-21 ENCOUNTER — Other Ambulatory Visit: Payer: Self-pay

## 2022-01-21 ENCOUNTER — Emergency Department (HOSPITAL_COMMUNITY)
Admission: EM | Admit: 2022-01-21 | Discharge: 2022-01-21 | Disposition: A | Discharge status: Home / Self Care | Payer: Self-pay | Attending: Physician Assistant | Admitting: Physician Assistant

## 2022-01-21 ENCOUNTER — Encounter (HOSPITAL_COMMUNITY): Payer: Self-pay | Admitting: Emergency Medicine

## 2022-01-21 LAB — CBC WITH DIFFERENTIAL/PLATELET
Abs Immature Granulocytes: 0.03 10*3/uL (ref 0.00–0.07)
Basophils Absolute: 0.1 10*3/uL (ref 0.0–0.1)
Basophils Relative: 1 %
Eosinophils Absolute: 0.5 10*3/uL (ref 0.0–0.5)
Eosinophils Relative: 4 %
HCT: 43.5 % (ref 39.0–52.0)
Hemoglobin: 15.1 g/dL (ref 13.0–17.0)
Immature Granulocytes: 0 %
Lymphocytes Relative: 26 %
Lymphs Abs: 2.8 10*3/uL (ref 0.7–4.0)
MCH: 29.6 pg (ref 26.0–34.0)
MCHC: 34.7 g/dL (ref 30.0–36.0)
MCV: 85.3 fL (ref 80.0–100.0)
Monocytes Absolute: 0.8 10*3/uL (ref 0.1–1.0)
Monocytes Relative: 7 %
Neutro Abs: 6.7 10*3/uL (ref 1.7–7.7)
Neutrophils Relative %: 62 %
Platelets: 249 10*3/uL (ref 150–400)
RBC: 5.1 MIL/uL (ref 4.22–5.81)
RDW: 11.5 % (ref 11.5–15.5)
WBC: 10.8 10*3/uL — ABNORMAL HIGH (ref 4.0–10.5)
nRBC: 0 % (ref 0.0–0.2)

## 2022-01-21 LAB — BASIC METABOLIC PANEL
Anion gap: 5 (ref 5–15)
BUN: 5 mg/dL — ABNORMAL LOW (ref 6–20)
CO2: 30 mmol/L (ref 22–32)
Calcium: 9 mg/dL (ref 8.9–10.3)
Chloride: 99 mmol/L (ref 98–111)
Creatinine, Ser: 0.58 mg/dL — ABNORMAL LOW (ref 0.61–1.24)
GFR, Estimated: >60 mL/min (ref >60)
Glucose, Bld: 336 mg/dL — ABNORMAL HIGH (ref 70–99)
Potassium: 4.5 mmol/L (ref 3.5–5.1)
Sodium: 134 mmol/L — ABNORMAL LOW (ref 135–145)

## 2022-01-21 LAB — CBG MONITORING, ED: Glucose-Capillary: 284 mg/dL — ABNORMAL HIGH (ref 70–99)

## 2022-01-21 LAB — LACTIC ACID, PLASMA: Lactic Acid, Venous: 0.7 mmol/L (ref 0.5–1.9)

## 2022-01-21 NOTE — ED Notes (Signed)
Pt decided to leave 

## 2022-01-21 NOTE — ED Triage Notes (Signed)
Pt arrive POV from home for c/o increase swollen, redness and pain on right foot. Hx of DM. ?

## 2022-01-21 NOTE — ED Provider Triage Note (Signed)
Emergency Medicine Provider Triage Evaluation Note ? ?[name] , a 30 y.o. male  was evaluated in triage.  Pt complains of pain and swelling to right foot.  States he has had pain to his right foot for quite some time which he states greater than 6 months however over the last 24 to 48 hours has developed erythema, warmth to the dorsum of his right foot.  Significant other noted ulceration to plantar aspect right lateral foot.  He is diabetic states his blood sugars have been "higher than normal."  No fever, emesis.  No history of PE, DVT.  No pain to calf. ? ?Review of Systems  ?Positive: Right foot pain, redness, warmth ?Negative: fever ? ?Physical Exam  ?BP 135/87 (BP Location: Right Arm)   Pulse (!) 106   Temp 99.5 ?F (37.5 ?C) (Oral)   Resp 19   SpO2 98%  ?Gen:   Awake, no distress   ?Resp:  Normal effort  ?MSK:   Moves extremities without difficulty, tenderness soft tissue swelling to dorsum right foot.  Lesion plantar aspect right foot.  Compartments soft ?Other:   ? ?Medical Decision Making  ?Medically screening exam initiated at 12:14 AM.  Appropriate orders placed.  [name] was informed that the remainder of the evaluation will be completed by another provider, this initial triage assessment does not replace that evaluation, and the importance of remaining in the ED until their evaluation is complete. ? ?Right foot pain, redness and warmth ?  ?Dorotea Hand A, PA-C ?01/21/22 0016 ? ?

## 2022-01-24 ENCOUNTER — Inpatient Hospital Stay (HOSPITAL_COMMUNITY): Payer: Self-pay | Admitting: Certified Registered"

## 2022-01-24 ENCOUNTER — Encounter (HOSPITAL_COMMUNITY): Payer: Self-pay | Admitting: Emergency Medicine

## 2022-01-24 ENCOUNTER — Inpatient Hospital Stay (HOSPITAL_COMMUNITY)
Admission: EM | Admit: 2022-01-24 | Discharge: 2022-01-27 | DRG: 854 | Disposition: A | Discharge status: Home / Self Care | Payer: Self-pay | Attending: Student | Admitting: Student

## 2022-01-24 ENCOUNTER — Emergency Department (HOSPITAL_COMMUNITY): Payer: Self-pay

## 2022-01-24 ENCOUNTER — Other Ambulatory Visit: Payer: Self-pay

## 2022-01-24 ENCOUNTER — Ambulatory Visit (HOSPITAL_COMMUNITY)
Admission: EM | Admit: 2022-01-24 | Discharge: 2022-01-24 | Payer: Self-pay | Attending: Emergency Medicine | Admitting: Emergency Medicine

## 2022-01-24 ENCOUNTER — Encounter (HOSPITAL_COMMUNITY)
Admission: EM | Disposition: A | Discharge status: Home / Self Care | Payer: Self-pay | Source: Home / Self Care | Attending: Student

## 2022-01-24 ENCOUNTER — Inpatient Hospital Stay (HOSPITAL_COMMUNITY): Payer: Self-pay

## 2022-01-24 DIAGNOSIS — Z79899 Other long term (current) drug therapy: Secondary | ICD-10-CM | POA: Diagnosis not present

## 2022-01-24 DIAGNOSIS — I10 Essential (primary) hypertension: Secondary | ICD-10-CM

## 2022-01-24 DIAGNOSIS — A419 Sepsis, unspecified organism: Secondary | ICD-10-CM

## 2022-01-24 DIAGNOSIS — E118 Type 2 diabetes mellitus with unspecified complications: Secondary | ICD-10-CM

## 2022-01-24 DIAGNOSIS — E11628 Type 2 diabetes mellitus with other skin complications: Secondary | ICD-10-CM | POA: Diagnosis present

## 2022-01-24 DIAGNOSIS — L03115 Cellulitis of right lower limb: Secondary | ICD-10-CM | POA: Diagnosis present

## 2022-01-24 DIAGNOSIS — E11621 Type 2 diabetes mellitus with foot ulcer: Secondary | ICD-10-CM | POA: Diagnosis present

## 2022-01-24 DIAGNOSIS — E1165 Type 2 diabetes mellitus with hyperglycemia: Secondary | ICD-10-CM

## 2022-01-24 DIAGNOSIS — E11649 Type 2 diabetes mellitus with hypoglycemia without coma: Secondary | ICD-10-CM | POA: Diagnosis not present

## 2022-01-24 DIAGNOSIS — R7989 Other specified abnormal findings of blood chemistry: Secondary | ICD-10-CM

## 2022-01-24 DIAGNOSIS — Z794 Long term (current) use of insulin: Secondary | ICD-10-CM

## 2022-01-24 DIAGNOSIS — L97519 Non-pressure chronic ulcer of other part of right foot with unspecified severity: Secondary | ICD-10-CM | POA: Diagnosis present

## 2022-01-24 DIAGNOSIS — E1169 Type 2 diabetes mellitus with other specified complication: Secondary | ICD-10-CM | POA: Diagnosis present

## 2022-01-24 DIAGNOSIS — I96 Gangrene, not elsewhere classified: Secondary | ICD-10-CM | POA: Diagnosis present

## 2022-01-24 DIAGNOSIS — E1152 Type 2 diabetes mellitus with diabetic peripheral angiopathy with gangrene: Secondary | ICD-10-CM | POA: Diagnosis present

## 2022-01-24 DIAGNOSIS — E739 Lactose intolerance, unspecified: Secondary | ICD-10-CM | POA: Diagnosis present

## 2022-01-24 DIAGNOSIS — Z9114 Patient's other noncompliance with medication regimen: Secondary | ICD-10-CM

## 2022-01-24 DIAGNOSIS — M86171 Other acute osteomyelitis, right ankle and foot: Secondary | ICD-10-CM

## 2022-01-24 DIAGNOSIS — E1142 Type 2 diabetes mellitus with diabetic polyneuropathy: Secondary | ICD-10-CM | POA: Diagnosis present

## 2022-01-24 DIAGNOSIS — E119 Type 2 diabetes mellitus without complications: Secondary | ICD-10-CM | POA: Diagnosis present

## 2022-01-24 DIAGNOSIS — F17210 Nicotine dependence, cigarettes, uncomplicated: Secondary | ICD-10-CM | POA: Diagnosis present

## 2022-01-24 DIAGNOSIS — F1111 Opioid abuse, in remission: Secondary | ICD-10-CM | POA: Diagnosis present

## 2022-01-24 DIAGNOSIS — L03119 Cellulitis of unspecified part of limb: Secondary | ICD-10-CM | POA: Insufficient documentation

## 2022-01-24 DIAGNOSIS — L84 Corns and callosities: Secondary | ICD-10-CM | POA: Diagnosis present

## 2022-01-24 DIAGNOSIS — G629 Polyneuropathy, unspecified: Secondary | ICD-10-CM

## 2022-01-24 DIAGNOSIS — Z91012 Allergy to eggs: Secondary | ICD-10-CM | POA: Diagnosis not present

## 2022-01-24 DIAGNOSIS — J45909 Unspecified asthma, uncomplicated: Secondary | ICD-10-CM | POA: Diagnosis present

## 2022-01-24 DIAGNOSIS — L02611 Cutaneous abscess of right foot: Secondary | ICD-10-CM

## 2022-01-24 DIAGNOSIS — A401 Sepsis due to streptococcus, group B: Secondary | ICD-10-CM | POA: Diagnosis present

## 2022-01-24 DIAGNOSIS — L089 Local infection of the skin and subcutaneous tissue, unspecified: Secondary | ICD-10-CM

## 2022-01-24 DIAGNOSIS — E1110 Type 2 diabetes mellitus with ketoacidosis without coma: Secondary | ICD-10-CM

## 2022-01-24 DIAGNOSIS — D72825 Bandemia: Secondary | ICD-10-CM

## 2022-01-24 DIAGNOSIS — L03031 Cellulitis of right toe: Principal | ICD-10-CM

## 2022-01-24 DIAGNOSIS — F1190 Opioid use, unspecified, uncomplicated: Secondary | ICD-10-CM

## 2022-01-24 HISTORY — PX: INCISION AND DRAINAGE ABSCESS: SHX5864

## 2022-01-24 LAB — CBC WITH DIFFERENTIAL/PLATELET
Abs Immature Granulocytes: 0.09 10*3/uL — ABNORMAL HIGH (ref 0.00–0.07)
Basophils Absolute: 0.1 10*3/uL (ref 0.0–0.1)
Basophils Relative: 1 %
Eosinophils Absolute: 0 10*3/uL (ref 0.0–0.5)
Eosinophils Relative: 0 %
HCT: 43.9 % (ref 39.0–52.0)
Hemoglobin: 15 g/dL (ref 13.0–17.0)
Immature Granulocytes: 1 %
Lymphocytes Relative: 9 %
Lymphs Abs: 1.5 10*3/uL (ref 0.7–4.0)
MCH: 29.4 pg (ref 26.0–34.0)
MCHC: 34.2 g/dL (ref 30.0–36.0)
MCV: 85.9 fL (ref 80.0–100.0)
Monocytes Absolute: 0.8 10*3/uL (ref 0.1–1.0)
Monocytes Relative: 5 %
Neutro Abs: 14 10*3/uL — ABNORMAL HIGH (ref 1.7–7.7)
Neutrophils Relative %: 84 %
Platelets: 254 10*3/uL (ref 150–400)
RBC: 5.11 MIL/uL (ref 4.22–5.81)
RDW: 11.6 % (ref 11.5–15.5)
WBC: 16.6 10*3/uL — ABNORMAL HIGH (ref 4.0–10.5)
nRBC: 0 % (ref 0.0–0.2)

## 2022-01-24 LAB — LACTIC ACID, PLASMA
Lactic Acid, Venous: 0.9 mmol/L (ref 0.5–1.9)
Lactic Acid, Venous: 1.2 mmol/L (ref 0.5–1.9)

## 2022-01-24 LAB — COMPREHENSIVE METABOLIC PANEL
ALT: 14 U/L (ref 0–44)
AST: 13 U/L — ABNORMAL LOW (ref 15–41)
Albumin: 3.8 g/dL (ref 3.5–5.0)
Alkaline Phosphatase: 106 U/L (ref 38–126)
Anion gap: 12 (ref 5–15)
BUN: 7 mg/dL (ref 6–20)
CO2: 26 mmol/L (ref 22–32)
Calcium: 9 mg/dL (ref 8.9–10.3)
Chloride: 96 mmol/L — ABNORMAL LOW (ref 98–111)
Creatinine, Ser: 0.54 mg/dL — ABNORMAL LOW (ref 0.61–1.24)
GFR, Estimated: >60 mL/min (ref >60)
Glucose, Bld: 239 mg/dL — ABNORMAL HIGH (ref 70–99)
Potassium: 4.2 mmol/L (ref 3.5–5.1)
Sodium: 134 mmol/L — ABNORMAL LOW (ref 135–145)
Total Bilirubin: 1.6 mg/dL — ABNORMAL HIGH (ref 0.3–1.2)
Total Protein: 8.1 g/dL (ref 6.5–8.1)

## 2022-01-24 LAB — GLUCOSE, CAPILLARY: Glucose-Capillary: 259 mg/dL — ABNORMAL HIGH (ref 70–99)

## 2022-01-24 SURGERY — INCISION AND DRAINAGE, ABSCESS
Anesthesia: General | Site: Foot | Laterality: Right

## 2022-01-24 MED ORDER — BACITRACIN ZINC 500 UNIT/GM EX OINT
TOPICAL_OINTMENT | CUTANEOUS | Status: DC | PRN
  Administered 2022-01-24: 1 via TOPICAL

## 2022-01-24 MED ORDER — INSULIN GLARGINE-YFGN 100 UNIT/ML ~~LOC~~ SOLN
23.0000 [IU] | Freq: Every day | SUBCUTANEOUS | Status: DC
  Administered 2022-01-25 – 2022-01-27 (×3): 23 [IU] via SUBCUTANEOUS
  Filled 2022-01-24 (×3): qty 0.23

## 2022-01-24 MED ORDER — SODIUM CHLORIDE 0.9 % IV SOLN
2.0000 g | Freq: Three times a day (TID) | INTRAVENOUS | Status: DC
Start: 2022-01-24 — End: 2022-01-25
  Administered 2022-01-25: 2 g via INTRAVENOUS
  Filled 2022-01-24 (×2): qty 2

## 2022-01-24 MED ORDER — VANCOMYCIN HCL 2000 MG/400ML IV SOLN
2000.0000 mg | Freq: Once | INTRAVENOUS | Status: AC
  Administered 2022-01-24: 2000 mg via INTRAVENOUS
  Filled 2022-01-24: qty 400

## 2022-01-24 MED ORDER — SODIUM CHLORIDE 0.9 % IV SOLN: INTRAVENOUS | Status: DC

## 2022-01-24 MED ORDER — LIDOCAINE HCL (PF) 2 % IJ SOLN
INTRAMUSCULAR | Status: AC
  Filled 2022-01-24: qty 5

## 2022-01-24 MED ORDER — INSULIN ASPART 100 UNIT/ML IJ SOLN
INTRAMUSCULAR | Status: AC
  Filled 2022-01-24: qty 1

## 2022-01-24 MED ORDER — INSULIN ASPART 100 UNIT/ML IJ SOLN: 5.0000 [IU] | Freq: Three times a day (TID) | INTRAMUSCULAR | Status: DC

## 2022-01-24 MED ORDER — BACITRACIN ZINC 500 UNIT/GM EX OINT
TOPICAL_OINTMENT | CUTANEOUS | Status: AC
  Filled 2022-01-24: qty 28.35

## 2022-01-24 MED ORDER — IBUPROFEN 400 MG PO TABS
400.0000 mg | ORAL_TABLET | Freq: Four times a day (QID) | ORAL | Status: DC | PRN
  Administered 2022-01-26: 400 mg via ORAL
  Filled 2022-01-24: qty 1

## 2022-01-24 MED ORDER — ENOXAPARIN SODIUM 40 MG/0.4ML IJ SOSY
40.0000 mg | PREFILLED_SYRINGE | INTRAMUSCULAR | Status: DC
  Administered 2022-01-25 – 2022-01-27 (×3): 40 mg via SUBCUTANEOUS
  Filled 2022-01-24 (×3): qty 0.4

## 2022-01-24 MED ORDER — NICOTINE 14 MG/24HR TD PT24
14.0000 mg | MEDICATED_PATCH | Freq: Every day | TRANSDERMAL | Status: DC
  Filled 2022-01-24 (×3): qty 1

## 2022-01-24 MED ORDER — ONDANSETRON HCL 4 MG/2ML IJ SOLN
INTRAMUSCULAR | Status: DC | PRN
  Administered 2022-01-24: 4 mg via INTRAVENOUS

## 2022-01-24 MED ORDER — HYDROMORPHONE HCL 1 MG/ML IJ SOLN: 0.2500 mg | INTRAMUSCULAR | Status: DC | PRN

## 2022-01-24 MED ORDER — LIDOCAINE HCL (PF) 1 % IJ SOLN
INTRAMUSCULAR | Status: AC
Start: 2022-01-24 — End: ?
  Filled 2022-01-24: qty 30

## 2022-01-24 MED ORDER — INSULIN ASPART 100 UNIT/ML IJ SOLN
6.0000 [IU] | Freq: Once | INTRAMUSCULAR | Status: AC
  Administered 2022-01-25: 6 [IU] via SUBCUTANEOUS

## 2022-01-24 MED ORDER — TRAMADOL HCL 50 MG PO TABS
50.0000 mg | ORAL_TABLET | Freq: Three times a day (TID) | ORAL | Status: DC | PRN
  Administered 2022-01-24 – 2022-01-27 (×3): 50 mg via ORAL
  Filled 2022-01-24 (×3): qty 1

## 2022-01-24 MED ORDER — ACETAMINOPHEN 10 MG/ML IV SOLN: 1000.0000 mg | Freq: Once | INTRAVENOUS | Status: DC | PRN

## 2022-01-24 MED ORDER — MIDAZOLAM HCL 2 MG/2ML IJ SOLN
INTRAMUSCULAR | Status: AC
  Filled 2022-01-24: qty 2

## 2022-01-24 MED ORDER — DULOXETINE HCL 60 MG PO CPEP
60.0000 mg | ORAL_CAPSULE | Freq: Every day | ORAL | Status: DC
  Administered 2022-01-25 – 2022-01-27 (×3): 60 mg via ORAL
  Filled 2022-01-24 (×3): qty 1

## 2022-01-24 MED ORDER — PHENYLEPHRINE 40 MCG/ML (10ML) SYRINGE FOR IV PUSH (FOR BLOOD PRESSURE SUPPORT)
PREFILLED_SYRINGE | INTRAVENOUS | Status: AC
  Filled 2022-01-24: qty 10

## 2022-01-24 MED ORDER — ONDANSETRON HCL 4 MG PO TABS: 4.0000 mg | ORAL_TABLET | Freq: Four times a day (QID) | ORAL | Status: DC | PRN

## 2022-01-24 MED ORDER — BUPRENORPHINE HCL-NALOXONE HCL 8-2 MG SL SUBL
1.0000 | SUBLINGUAL_TABLET | Freq: Every day | SUBLINGUAL | Status: DC
  Administered 2022-01-25: 1 via SUBLINGUAL
  Filled 2022-01-24: qty 1

## 2022-01-24 MED ORDER — CHLORHEXIDINE GLUCONATE 4 % EX LIQD: 60.0000 mL | Freq: Once | CUTANEOUS | Status: DC

## 2022-01-24 MED ORDER — GABAPENTIN 100 MG PO CAPS
100.0000 mg | ORAL_CAPSULE | Freq: Three times a day (TID) | ORAL | Status: DC
  Administered 2022-01-24 – 2022-01-27 (×9): 100 mg via ORAL
  Filled 2022-01-24 (×9): qty 1

## 2022-01-24 MED ORDER — VANCOMYCIN HCL 1000 MG IV SOLR
INTRAVENOUS | Status: DC | PRN
  Administered 2022-01-24: 1000 mg via TOPICAL

## 2022-01-24 MED ORDER — VANCOMYCIN HCL 1500 MG/300ML IV SOLN
1500.0000 mg | Freq: Two times a day (BID) | INTRAVENOUS | Status: DC
  Administered 2022-01-25: 1500 mg via INTRAVENOUS
  Filled 2022-01-24: qty 300

## 2022-01-24 MED ORDER — IBUPROFEN 800 MG PO TABS
800.0000 mg | ORAL_TABLET | Freq: Once | ORAL | Status: AC
  Administered 2022-01-24: 800 mg via ORAL
  Filled 2022-01-24: qty 1

## 2022-01-24 MED ORDER — INSULIN GLARGINE SOLOSTAR 100 UNIT/ML ~~LOC~~ SOPN: 23.0000 [IU] | PEN_INJECTOR | Freq: Every day | SUBCUTANEOUS | Status: DC

## 2022-01-24 MED ORDER — GADOBUTROL 1 MMOL/ML IV SOLN
8.0000 mL | Freq: Once | INTRAVENOUS | Status: AC | PRN
  Administered 2022-01-24: 8 mL via INTRAVENOUS

## 2022-01-24 MED ORDER — INSULIN ASPART 100 UNIT/ML IJ SOLN
0.0000 [IU] | Freq: Three times a day (TID) | INTRAMUSCULAR | Status: DC
  Filled 2022-01-24: qty 0.09

## 2022-01-24 MED ORDER — LIDOCAINE 2% (20 MG/ML) 5 ML SYRINGE
INTRAMUSCULAR | Status: DC | PRN
  Administered 2022-01-24: 100 mg via INTRAVENOUS

## 2022-01-24 MED ORDER — PROPOFOL 10 MG/ML IV BOLUS
INTRAVENOUS | Status: DC | PRN
  Administered 2022-01-24: 200 mg via INTRAVENOUS

## 2022-01-24 MED ORDER — METRONIDAZOLE 500 MG PO TABS
500.0000 mg | ORAL_TABLET | Freq: Two times a day (BID) | ORAL | Status: DC
  Administered 2022-01-25 – 2022-01-27 (×5): 500 mg via ORAL
  Filled 2022-01-24 (×6): qty 1

## 2022-01-24 MED ORDER — PROPOFOL 10 MG/ML IV BOLUS
INTRAVENOUS | Status: AC
  Filled 2022-01-24: qty 20

## 2022-01-24 MED ORDER — DOCUSATE SODIUM 100 MG PO CAPS
100.0000 mg | ORAL_CAPSULE | Freq: Two times a day (BID) | ORAL | Status: DC
  Administered 2022-01-24 – 2022-01-27 (×6): 100 mg via ORAL
  Filled 2022-01-24 (×6): qty 1

## 2022-01-24 MED ORDER — FENTANYL CITRATE (PF) 250 MCG/5ML IJ SOLN
INTRAMUSCULAR | Status: DC | PRN
  Administered 2022-01-24 (×5): 50 ug via INTRAVENOUS

## 2022-01-24 MED ORDER — INSULIN ASPART 100 UNIT/ML FLEXPEN: 5.0000 [IU] | PEN_INJECTOR | Freq: Three times a day (TID) | SUBCUTANEOUS | Status: DC

## 2022-01-24 MED ORDER — PIPERACILLIN-TAZOBACTAM 3.375 G IVPB 30 MIN
3.3750 g | Freq: Once | INTRAVENOUS | Status: AC
  Administered 2022-01-24: 3.375 g via INTRAVENOUS
  Filled 2022-01-24: qty 50

## 2022-01-24 MED ORDER — PHENYLEPHRINE 40 MCG/ML (10ML) SYRINGE FOR IV PUSH (FOR BLOOD PRESSURE SUPPORT)
PREFILLED_SYRINGE | INTRAVENOUS | Status: DC | PRN
  Administered 2022-01-24: 200 ug via INTRAVENOUS

## 2022-01-24 MED ORDER — SODIUM CHLORIDE 0.9 % IR SOLN
Status: DC | PRN
  Administered 2022-01-24: 3000 mL

## 2022-01-24 MED ORDER — ONDANSETRON HCL 4 MG/2ML IJ SOLN: 4.0000 mg | Freq: Once | INTRAMUSCULAR | Status: DC | PRN

## 2022-01-24 MED ORDER — VANCOMYCIN HCL 1000 MG IV SOLR
INTRAVENOUS | Status: AC
Start: 2022-01-24 — End: ?
  Filled 2022-01-24: qty 20

## 2022-01-24 MED ORDER — CEFAZOLIN SODIUM-DEXTROSE 2-4 GM/100ML-% IV SOLN
INTRAVENOUS | Status: AC
  Filled 2022-01-24: qty 100

## 2022-01-24 MED ORDER — DEXAMETHASONE SODIUM PHOSPHATE 10 MG/ML IJ SOLN
INTRAMUSCULAR | Status: AC
  Filled 2022-01-24: qty 1

## 2022-01-24 MED ORDER — ONDANSETRON HCL 4 MG/2ML IJ SOLN: 4.0000 mg | Freq: Four times a day (QID) | INTRAMUSCULAR | Status: DC | PRN

## 2022-01-24 MED ORDER — POVIDONE-IODINE 10 % EX SWAB: 2.0000 "application " | Freq: Once | CUTANEOUS | Status: DC

## 2022-01-24 MED ORDER — MIDAZOLAM HCL 2 MG/2ML IJ SOLN
INTRAMUSCULAR | Status: DC | PRN
  Administered 2022-01-24: 2 mg via INTRAVENOUS

## 2022-01-24 MED ORDER — ONDANSETRON HCL 4 MG/2ML IJ SOLN
INTRAMUSCULAR | Status: AC
  Filled 2022-01-24: qty 2

## 2022-01-24 MED ORDER — CEFAZOLIN SODIUM-DEXTROSE 2-4 GM/100ML-% IV SOLN
2.0000 g | Freq: Once | INTRAVENOUS | Status: AC
  Administered 2022-01-24: 2 g via INTRAVENOUS

## 2022-01-24 MED ORDER — INSULIN ASPART 100 UNIT/ML IJ SOLN
6.0000 [IU] | Freq: Once | INTRAMUSCULAR | Status: AC
  Administered 2022-01-24: 6 [IU] via SUBCUTANEOUS

## 2022-01-24 MED ORDER — LACTATED RINGERS IV SOLN: INTRAVENOUS | Status: DC | PRN

## 2022-01-24 MED ORDER — FENTANYL CITRATE (PF) 250 MCG/5ML IJ SOLN
INTRAMUSCULAR | Status: AC
  Filled 2022-01-24: qty 5

## 2022-01-24 SURGICAL SUPPLY — 59 items
BLADE SURG 15 STRL LF DISP TIS (BLADE) ×1 IMPLANT
BLADE SURG 15 STRL SS (BLADE) ×1
BNDG ELASTIC 3X5.8 VLCR STR LF (GAUZE/BANDAGES/DRESSINGS) ×1 IMPLANT
BNDG ELASTIC 4X5.8 VLCR STR LF (GAUZE/BANDAGES/DRESSINGS) ×2 IMPLANT
BNDG ESMARK 4X9 LF (GAUZE/BANDAGES/DRESSINGS) ×1 IMPLANT
BNDG GAUZE ELAST 4 BULKY (GAUZE/BANDAGES/DRESSINGS) ×2 IMPLANT
CHLORAPREP W/TINT 26 (MISCELLANEOUS) ×2 IMPLANT
CNTNR URN SCR LID CUP LEK RST (MISCELLANEOUS) ×1 IMPLANT
CONT SPEC 4OZ STRL OR WHT (MISCELLANEOUS) ×1
COVER BACK TABLE 60X90IN (DRAPES) ×1 IMPLANT
CUFF TOURN SGL QUICK 18X4 (TOURNIQUET CUFF) ×1 IMPLANT
CUFF TOURN SGL QUICK 24 (TOURNIQUET CUFF)
CUFF TRNQT CYL 24X4X16.5-23 (TOURNIQUET CUFF) ×1 IMPLANT
DRAPE 3/4 80X56 (DRAPES) ×2 IMPLANT
DRAPE EXTREMITY T 121X128X90 (DISPOSABLE) ×2 IMPLANT
DRAPE SHEET LG 3/4 BI-LAMINATE (DRAPES) ×2 IMPLANT
DRAPE U-SHAPE 47X51 STRL (DRAPES) ×2 IMPLANT
DRSG ADAPTIC 3X8 NADH LF (GAUZE/BANDAGES/DRESSINGS) ×1 IMPLANT
DRSG PAD ABDOMINAL 8X10 ST (GAUZE/BANDAGES/DRESSINGS) ×1 IMPLANT
ELECT REM PT RETURN 15FT ADLT (MISCELLANEOUS) ×2 IMPLANT
GAUZE 4X4 16PLY ~~LOC~~+RFID DBL (SPONGE) ×1 IMPLANT
GAUZE PACKING IODOFORM 1X5 (PACKING) ×1 IMPLANT
GAUZE SPONGE 4X4 12PLY STRL (GAUZE/BANDAGES/DRESSINGS) ×2 IMPLANT
GAUZE XEROFORM 1X8 LF (GAUZE/BANDAGES/DRESSINGS) ×1 IMPLANT
GLOVE SRG 8 PF TXTR STRL LF DI (GLOVE) ×1 IMPLANT
GLOVE SURG ENC MOIS LTX SZ7.5 (GLOVE) ×2 IMPLANT
GLOVE SURG NEOPR MICRO LF SZ8 (GLOVE) ×2 IMPLANT
GLOVE SURG UNDER POLY LF SZ8 (GLOVE) ×1
GOWN STRL REUS W/ TWL XL LVL3 (GOWN DISPOSABLE) ×1 IMPLANT
GOWN STRL REUS W/TWL XL LVL3 (GOWN DISPOSABLE) ×1
KIT BASIN OR (CUSTOM PROCEDURE TRAY) ×2 IMPLANT
KIT TURNOVER KIT A (KITS) IMPLANT
MANIFOLD NEPTUNE II (INSTRUMENTS) ×2 IMPLANT
NDL HYPO 25X1 1.5 SAFETY (NEEDLE) ×1 IMPLANT
NEEDLE HYPO 25X1 1.5 SAFETY (NEEDLE) IMPLANT
NS IRRIG 1000ML POUR BTL (IV SOLUTION) ×2 IMPLANT
PADDING CAST ABS 4INX4YD NS (CAST SUPPLIES)
PADDING CAST ABS COTTON 4X4 ST (CAST SUPPLIES) ×1 IMPLANT
PENCIL SMOKE EVACUATOR (MISCELLANEOUS) ×1 IMPLANT
SET IRRIG Y TYPE TUR BLADDER L (SET/KITS/TRAYS/PACK) ×2 IMPLANT
SPONGE T-LAP 18X18 ~~LOC~~+RFID (SPONGE) ×1 IMPLANT
SPONGE T-LAP 4X18 ~~LOC~~+RFID (SPONGE) ×1 IMPLANT
STAPLER VISISTAT 35W (STAPLE) ×1 IMPLANT
STOCKINETTE 6  STRL (DRAPES) ×1
STOCKINETTE 6 STRL (DRAPES) ×1 IMPLANT
SUCTION FRAZIER HANDLE 10FR (MISCELLANEOUS)
SUCTION TUBE FRAZIER 10FR DISP (MISCELLANEOUS) ×1 IMPLANT
SUT ETHILON 2 0 PS N (SUTURE) ×1 IMPLANT
SUT ETHILON 3 0 PS 1 (SUTURE) ×1 IMPLANT
SUT ETHILON 4 0 PS 2 18 (SUTURE) ×1 IMPLANT
SUT MNCRL AB 3-0 PS2 18 (SUTURE) ×1 IMPLANT
SUT MNCRL AB 4-0 PS2 18 (SUTURE) ×1 IMPLANT
SUT VIC AB 2-0 SH 27 (SUTURE)
SUT VIC AB 2-0 SH 27XBRD (SUTURE) ×1 IMPLANT
SYR BULB EAR ULCER 3OZ GRN STR (SYRINGE) ×1 IMPLANT
SYR CONTROL 10ML LL (SYRINGE) ×1 IMPLANT
TUBE IRRIGATION SET MISONIX (TUBING) ×1 IMPLANT
UNDERPAD 30X36 HEAVY ABSORB (UNDERPADS AND DIAPERS) ×2 IMPLANT
YANKAUER SUCT BULB TIP NO VENT (SUCTIONS) ×1 IMPLANT

## 2022-01-24 NOTE — ED Provider Notes (Signed)
?[facility] ? ? ? ?CSN: [id number] ?Arrival date & time: 01/24/22  1107 ? ? ?  ? ?History   ?Chief Complaint ?Chief Complaint  ?Patient presents with  ? Foot Swelling  ? ? ?HPI ?[name] is a 31 y.o. male.  ? ?Presents with erythema, swelling and pain to the right foot for 3 days.  Endorses that symptoms are worsening.  Painful to bear weight.  Has been taking unknown antibiotic for few days, not prescribed by provider.  Was seen in the emergency department 3 days ago but left prior to evaluation due to the wait time.  ? ? ? ?Past Medical History:  ?Diagnosis Date  ? Asthma   ? HTN (hypertension) 07/25/2017  ? Lisinopril 30m Daily  ? Hyperglycemia 09/24/2017  ? [place] II diabetes mellitus ([facility]   ? "dx'd 06/2017"  ? ? ?Patient Active Problem List  ? Diagnosis Date Noted  ? Callus 10/19/2021  ? Anxiety and depression 03/22/2021  ? Erectile dysfunction 09/15/2020  ? Polycythemia 08/19/2020  ? 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 Polyneuropathy 07/23/2020  ? 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 Smoking 08/01/2017  ? ? ?Past Surgical History:  ?Procedure Laterality Date  ? ADENOIDECTOMY    ? LACERATION REPAIR Left   ? "stitched finger up"  ? TONSILLECTOMY    ? ? ? ? ? ?Home Medications   ? ?Prior to Admission medications   ?Medication Sig Start Date End Date Taking? Authorizing Provider  ?Blood Glucose Monitoring Suppl (TRUE METRIX METER) w/Device KIT USE AS DIRECTED 06/22/21   WAngelica Pou MD  ?buprenorphine-naloxone (SUBOXONE) 8-2 mg SUBL SL tablet Place 1 tablet under the tongue in the morning, at noon, and at bedtime. 01/03/22 02/02/22  PLajean Manes MD  ?DULoxetine (CYMBALTA) 60 MG capsule Take 1 capsule (60 mg total) by mouth daily. 01/03/22   PLajean Manes MD  ?gabapentin (NEURONTIN) 100 MG capsule Take 1 capsule (100 mg total) by mouth 3 (three) times daily. 01/03/22  12/29/22  PLajean Manes MD  ?glucose blood (TRUE METRIX BLOOD GLUCOSE TEST) test strip Use as directed 01/03/22   PLajean Manes MD  ?insulin aspart (NOVOLOG FLEXPEN) 100 UNIT/ML FlexPen Inject 7 Units into the skin 3 (three) times daily with meals. 12/05/21 03/05/22  JVirl Axe MD  ?Insulin Glargine Solostar (LANTUS) 100 UNIT/ML Solostar Pen Inject 23 Units into the skin daily. 12/05/21   JVirl Axe MD  ? ? ?Family History ?No family history on file. ? ?Social History ?Social History  ? ?Tobacco Use  ? Smoking status: Every Day  ?  Packs/day: 0.50  ?  Years: 12.00  ?  Pack years: 6.00  ?  Types: Cigarettes  ? Smokeless tobacco: Never  ? Tobacco comments:  ?  0.5 PPD  ?Vaping Use  ? Vaping Use: Never used  ?Substance Use Topics  ? Alcohol use: Yes  ?  Comment: OCCASSIONAL  ? Drug use: Yes  ?  Types: Marijuana  ?  Comment: 10/17/2017 ~3 times per week  ? ? ? ?Allergies   ?Vicodin [hydrocodone-acetaminophen], Eggs or egg-derived products, and Lactose intolerance (gi) ? ? ?Review of Systems ?Review of Systems ? ? ?Physical Exam ?Triage Vital Signs ?ED Triage Vitals  ?Enc Vitals Group  ?   BP 01/24/22 1128 (!) 142/83  ?   Pulse Rate 01/24/22 1128 (!) 121  ?   Resp 01/24/22 1128 18  ?  Temp 01/24/22 1128 99.2 ?F (37.3 ?C)  ?   Temp Source 01/24/22 1128 Oral  ?   SpO2 01/24/22 1128 99 %  ?   Weight --   ?   Height --   ?   Head Circumference --   ?   Peak Flow --   ?   Pain Score 01/24/22 1127 10  ?   Pain Loc --   ?   Pain Edu? --   ?   Excl. in [place]? --   ? ?No data found. ? ?Updated Vital Signs ?BP (!) 142/83 (BP Location: Right Arm)   Pulse (!) 121   Temp 99.2 ?F (37.3 ?C) (Oral)   Resp 18   SpO2 99%  ? ?Visual Acuity ?Right Eye Distance:   ?Left Eye Distance:   ?Bilateral Distance:   ? ?Right Eye Near:   ?Left Eye Near:    ?Bilateral Near:    ? ?Physical Exam ? ? ?UC Treatments / Results  ?Labs ?(all labs ordered are listed, but only abnormal results are displayed) ?Labs Reviewed - No data to  display ? ?EKG ? ? ?Radiology ?No results found. ? ?Procedures ?Procedures (including critical care time) ? ?Medications Ordered in UC ?Medications - No data to display ? ?Initial Impression / Assessment and Plan / UC Course  ?I have reviewed the triage vital signs and the nursing notes. ? ?Pertinent labs & imaging results that were available during my care of the patient were reviewed by me and considered in my medical decision making (see chart for details). ? ?Cellulitis of right foot ? ?Patient sent to the nearest emergency department for evaluation for intravenous antibiotic, evaluated by this provider and MD in-house, site of infection is a callus on the right lateral aspect of the dorsal foot, as it appeared to be an abscess attempted to drain, moderate amount of malodorous bloody drainage expelled from site, decreased sensation over the site, concern for beginning of tissue necrosis, x-ray from 3 days ago negative, history of type 2 diabetes, uncontrolled  ?Final Clinical Impressions(s) / UC Diagnoses  ? ?Final diagnoses:  ?None  ? ?Discharge Instructions   ?None ?  ? ?ED Prescriptions   ?None ?  ? ?PDMP not reviewed this encounter. ?  ?Hans Eden, NP ?01/24/22 1230 ? ?

## 2022-01-24 NOTE — Op Note (Signed)
01/24/2022 ? ?10:32 PM ? ? ?PATIENT: [name]  31 y.o. male ? ?MRN: [id number] ? ? ?PRE-OPERATIVE DIAGNOSIS:   ?Right foot abscess in the setting of poorly controlled diabetes mellitus ? ? ?POST-OPERATIVE DIAGNOSIS:   ?Same ? ? ?PROCEDURE: ?Right foot incision and drainage of abscess ? ? ?SURGEON:  Netta Cedars, MD ? ? ?ASSISTANT: None ? ? ?ANESTHESIA: General, regional ? ? ?EBL: Minimal ? ? ?TOURNIQUET:   ?None used ? ? ?COMPLICATIONS: None apparent ? ? ?DISPOSITION: Extubated, awake and stable to recovery. ? ? ?INDICATION FOR PROCEDURE: ?Ortho consult by [name] ED for right foot infection. The patient is a poorly controlled young diabetic on insulin (A1c ranges anywhere from 9-14 with blood sugars up to 1800 mg/dL per patient and his mother report). He notes about 1 week of right forefoot pain and swelling. He was wearing an ankle brace for a sprain and feels that the brace scratched this area. He has known right plantar callus underneath 5th metatarsal head (on contralateral side as well). He notes drainage from the dorsum of this right foot over region of 5th metatarsal head. He reports fevers/chills over past 2 days. He has undergone repeat evaluation for this issue earlier this week by ED as well as PCP/urgent care. He has self medicated with meloxicam and prednisone over past few days. He denies any pain elsewhere in his foot, ankle, leg, knee or thigh.  ? ?We discussed the diagnosis, alternative treatment options, risks and benefits of the above surgical intervention, as well as alternative non-operative treatments. All questions/concerns were addressed and the patient/family demonstrated appropriate understanding of the diagnosis, the procedure, the postoperative course, and overall prognosis. The patient wished to proceed with surgical intervention and signed an informed surgical consent as such, in each others presence prior to surgery. ? ? ?PROCEDURE IN DETAIL: ?After preoperative consent was  obtained and the correct operative site was identified, the patient was brought to the operating room supine on stretcher and transferred onto operating table. General anesthesia was induced. Preoperative antibiotics were administered. Surgical timeout was taken. The patient was then positioned supine with an ipsilateral hip bump. The operative lower extremity was prepped and draped in standard sterile fashion. No tourniquet was inflated during this case.  ? ?We began by making a 1 cm incision directly dorsally over the abscess in the right lateral forefoot over the region of the 5th metatarsal head. Frank purulence was encountered. Multiple culture swabs were sent for microbiological analysis. We then debrided necrotic soft tissue in this area and sent this for pathology analysis. Purulence was evacuated in the dermis just plantar to this area thru the same incision. After thorough debridement, we irrigated this wound with 3 liters of normal saline. There was a contained space of 3 x 3 x 3 cm on intraoperative examination with exposed but intact extensor digitorum longus tendon. No bone was directly exposed in the wound. After saline irrigation, we instilled povidone iodine solution into the wound cavity and let this sit for several minutes for maximum antiseptic effect. Following this, we placed vancomycin powder within the deep portion of the cavity. We then loosely approximated the wound with 2-0 Nylon suture to allow for any needed drainage from the deep wound.  ? ?The leg was cleaned with saline and sterile bacitracin soaked adaptic dressings with gauze were applied. A well padded loose soft dressing was applied. The patient was awakened from anesthesia and transported to the recovery room in stable condition.  ? ? 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 ? ?RADIOGRAPHS: ?No intraoperative imaging was used in this case. ? ? ?Netta Cedars ?Orthopaedic Surgery ?[facility] ? ? ?

## 2022-01-24 NOTE — Assessment & Plan Note (Addendum)
Continue home gabapentin, Cymbalta and Suboxone ?

## 2022-01-24 NOTE — Transfer of Care (Addendum)
Immediate Anesthesia Transfer of Care Note ? ?Patient: [name] ? ?Procedure(s) Performed: INCISION AND DRAINAGE ABSCESS (Right: Foot) ? ?Patient Location: PACU ? ?Anesthesia Type:General ? ?Level of Consciousness: sedated ? ?Airway & Oxygen Therapy: Patient Spontanous Breathing and Patient connected to face mask oxygen ? ?Post-op Assessment: Report given to RN and Post -op Vital signs reviewed and stable ? ?Post vital signs: Reviewed and stable ? ?Last Vitals:  ?Vitals Value Taken Time  ?BP    ?Temp    ?Pulse    ?Resp    ?SpO2    ? ? ?Last Pain:  ?Vitals:  ? 01/24/22 2000  ?TempSrc: Oral  ?PainSc:   ?   ? ?  ? ?Complications: No notable events documented. ?

## 2022-01-24 NOTE — Anesthesia Procedure Notes (Signed)
Date/Time: 01/24/2022 9:45 PM ?Performed by: Minerva Ends, CRNA ?Oxygen Delivery Method: Simple face mask ?Placement Confirmation: positive ETCO2 and breath sounds checked- equal and bilateral ?Dental Injury: Teeth and Oropharynx as per pre-operative assessment  ? ? ? ? ?

## 2022-01-24 NOTE — Anesthesia Procedure Notes (Signed)
Procedure Name: LMA Insertion ?Date/Time: 01/24/2022 8:49 PM ?Performed by: Cynda Familia, CRNA ?Pre-anesthesia Checklist: Patient identified, Emergency Drugs available, Suction available and Patient being monitored ?Patient Re-evaluated:Patient Re-evaluated prior to induction ?Oxygen Delivery Method: Circle System Utilized ?Preoxygenation: Pre-oxygenation with 100% oxygen ?Induction Type: IV induction ?Ventilation: Mask ventilation without difficulty ?LMA: LMA inserted and LMA with gastric port inserted ?LMA Size: 4.0 ?Tube type: Oral ?Number of attempts: 1 ?Placement Confirmation: positive ETCO2 ?Tube secured with: Tape ?Dental Injury: Teeth and Oropharynx as per pre-operative assessment  ?Comments: IV induction Rose- LMA insertion AM CRNA atraumatic -- slight chipping front teeth - unchanged bilat BS ? ? ? ? ?

## 2022-01-24 NOTE — Consult Note (Signed)
? ? ?Patient ID: ?[name] ?MRN: [id number] ?DOB/AGE: 28-Jul-1991 29 y.o. ? ?Admit date: 01/24/2022 ? ?Admission Diagnoses:  ?Principal Problem: ?  Foot infection ?Active Problems: ?  Neuropathy ?  Opioid use disorder, mild, in sustained remission, on maintenance therapy, abuse ([place]) ?  Type 2 diabetes mellitus with complication, with long-term current use of insulin ([place]) ?  Pseudohyponatremia ?  Asthma ? ? ?HPI: ?Ortho consult by [facility] ED for right foot infection. The patient is a poorly controlled young diabetic on insulin (A1c ranges anywhere from 9-14 with blood sugars up to 1800 mg/dL per patient and his mother report). He notes about 1 week of right forefoot pain and swelling. He was wearing an ankle brace for a sprain and feels that the brace scratched this area. He has known right plantar callus underneath 5th metatarsal head (on contralateral side as well). He notes drainage from the dorsum of this right foot over region of 5th metatarsal head. He reports fevers/chills over past 2 days. He has undergone repeat evaluation for this issue earlier this week by ED as well as PCP/urgent care. He has self medicated with meloxicam and prednisone over past few days. He denies any pain elsewhere in his foot, ankle, leg, knee or thigh.  ? ?Past Medical History: ?Past Medical History:  ?Diagnosis Date  ? Asthma   ? HTN (hypertension) 07/25/2017  ? Lisinopril 40mg  Daily  ? Hyperglycemia 09/24/2017  ? Type II diabetes mellitus ([place])   ? "dx'd 06/2017"  ? ? ?Surgical History: ?Past Surgical History:  ?Procedure Laterality Date  ? ADENOIDECTOMY    ? LACERATION REPAIR Left   ? "stitched finger up"  ? TONSILLECTOMY    ? ? ?Family History: ?History reviewed. No pertinent family history. ? ?Social History: ?Social History  ? ?Socioeconomic History  ? Marital status: Single  ?  Spouse name: Not on file  ? Number of children: Not on file  ? Years of education: Not on file  ? Highest education level: Not on file  ?Occupational  History  ? Not on file  ?Tobacco Use  ? Smoking status: Every Day  ?  Packs/day: 0.50  ?  Years: 12.00  ?  Pack years: 6.00  ?  Types: Cigarettes  ? Smokeless tobacco: Never  ? Tobacco comments:  ?  0.5 PPD  ?Vaping Use  ? Vaping Use: Never used  ?Substance and Sexual Activity  ? Alcohol use: Yes  ?  Comment: OCCASSIONAL  ? Drug use: Yes  ?  Types: Marijuana  ?  Comment: 10/17/2017 ~3 times per week  ? Sexual activity: Never  ?Other Topics Concern  ? Not on file  ?Social History Narrative  ? Not on file  ? ?Social Determinants of Health  ? ?Financial Resource Strain: Not on file  ?Food Insecurity: Not on file  ?Transportation Needs: Not on file  ?Physical Activity: Not on file  ?Stress: Not on file  ?Social Connections: Not on file  ?Intimate Partner Violence: Not on file  ? ? ?Allergies: ?Vicodin [hydrocodone-acetaminophen], Eggs or egg-derived products, and Lactose intolerance (gi) ? ?Medications: ?I have reviewed the patient's current medications. ? ?Vital Signs: ?Patient Vitals for the past 24 hrs: ? BP Temp Temp src Pulse Resp SpO2 Height Weight  ?01/24/22 1735 124/74 98.3 ?F (36.8 ?C) Oral 100 18 100 % 5\' 6"  (1.676 m) 83.9 kg  ?01/24/22 1257 139/87 (!) 100.9 ?F (38.3 ?C) Oral 100 18 100 % -- --  ? ? ?Radiology: ?MR FOOT RIGHT  W WO CONTRAST ? ?Result Date: 01/24/2022 ?CLINICAL DATA:  Osteomyelitis, foot EXAM: MRI OF THE RIGHT FOREFOOT WITHOUT AND WITH CONTRAST TECHNIQUE: Multiplanar, multisequence MR imaging of the right forefoot was performed before and after the administration of intravenous contrast. CONTRAST:  24mL GADAVIST GADOBUTROL 1 MMOL/ML IV SOLN COMPARISON:  Right foot radiograph 01/24/2022 FINDINGS: Bones/Joint/Cartilage There is mild marrow edema and enhancement within the fifth metatarsal head and base of the fifth digit proximal phalanx. Preserved T1 marrow signal. Trace fifth MTP joint effusion. Ligaments Intact Lisfranc ligament.  Intact collateral ligaments. Muscles and Tendons No acute tendon  tear in the forefoot. Diffuse intramuscular edema foot without significant atrophy. This commonly seen in diabetics. Soft tissues There is diffuse soft tissue swelling of the foot, most prominent dorsally and laterally. There is a small blister like skin lesion along the lateral forefoot (axial T2 image 21). There is soft tissue gas along the dorsal lateral forefoot at the level of the distal fifth metatarsal/fifth MTP joint. IMPRESSION: Soft tissue gas along the dorsal lateral forefoot at the level of the distal fifth metatarsal/MTP joint suspicious for soft tissue infection, possibly necrotizing, with adjacent blister-like skin lesion. Underlying marrow edema and enhancement in the fifth metatarsal head and base of the fifth proximal phalanx likely represents early osteomyelitis. Trace fifth MTP joint effusion. Electronically Signed   By: Maurine Simmering M.D.   On: 01/24/2022 19:45  ? ?DG Foot Complete Right ? ?Result Date: 01/24/2022 ?CLINICAL DATA:  Concern for osteomyelitis. EXAM: RIGHT FOOT COMPLETE - 3+ VIEW COMPARISON:  None. FINDINGS: No cortical erosion or periosteal reaction. No evidence of fracture or dislocation. Multiple pockets of gas and prominent subcutaneous soft tissue swelling about the head of the fifth metatarsal, representing infectious/inflammatory process. IMPRESSION: 1. Soft tissue swelling and multiple pockets of gas about the head of the fifth metatarsal consistent with infectious/inflammatory process. 2. No cortical erosion or periosteal reaction, to suggest osteomyelitis, evaluation of early osteomyelitis is however limited on radiographs. MRI examination is recommended for further evaluation. Electronically Signed   By: Keane Police D.O.   On: 01/24/2022 14:51  ? ?DG Foot Complete Right ? ?Result Date: 01/21/2022 ?CLINICAL DATA:  Right foot pain and swelling. EXAM: RIGHT FOOT COMPLETE - 3+ VIEW COMPARISON:  None. FINDINGS: There is no evidence of fracture or dislocation. There is no evidence  of arthropathy or other focal bone abnormality. Soft tissues are unremarkable. IMPRESSION: Negative. Electronically Signed   By: Anner Crete M.D.   On: 01/21/2022 01:23   ? ?Labs: ?Recent Labs  ?  01/24/22 ?1411  ?WBC 16.6*  ?RBC 5.11  ?HCT 43.9  ?PLT 254  ? ?Recent Labs  ?  01/24/22 ?1411  ?NA 134*  ?K 4.2  ?CL 96*  ?CO2 26  ?BUN 7  ?CREATININE 0.54*  ?GLUCOSE 239*  ?CALCIUM 9.0  ? ?No results for input(s): LABPT, INR in the last 72 hours. ? ?Review of Systems: ?ROS as detailed in HPI ? ?Physical Exam: ?Body mass index is 29.86 kg/m?. ? ?Physical Exam  ?Gen: AAOx3, NAD ?Comfortable at rest ? ?Right Lower Extremity: ?Draining punctum over dorsum of swelling overlying 5th metatarsal head ?Purulent expressible drainage ?Thick plantar callus under 5th metatarsal head ?Skin otherwise intact ?TTP over this right foot swelling 3x3 cm with surrounding erythema ?HF/KE/KF/ADF/APF/EHL 5/5 ?SILT throughout ?DP, PT 2+ to palp ?CR < 2s ? ? ?Assessment and Plan: ?Right dorsal foot abscess over 5th metatarsal head x at least 1 wk now with frank purulent drainage in  setting of poorly controlled diabetes ? ?-history, exam and imaging reviewed at length with patient and his mother at bedside ?-MRI and XR reviewed with interpreting radiologist directly by phone ?-plan for right foot I&D with possible serial debridements and likely wound packing/delayed closure ?-we discussed possibility of severe infection requiring amputation at later date should today's intervention not provide adequate source control ?-patient has been NPO since yesterday afternoon, no chemical VTE ppx ?-plan for ID consult and IV antibiotics trending cultures ?-given MRI findings concerning for early osteomyelitis, anticipate prolonged course of IV antibiotic therapy ?-will require optimization of glycemic control and possible adjustment of his insulin regimen ? ?Armond Hang, MD ?Orthopaedic Surgeon ?[facility] ?[phone] ? ?The risks and benefits  were presented and reviewed. The risks due to recurrent/new/persistent infection, stiffness, nerve/vessel/tendon injury, nonunion/malunion, wound healing issues, development of arthritis, failure of

## 2022-01-24 NOTE — Assessment & Plan Note (Addendum)
MRI concerning for right foot cellulitis with possible necrotizing infection and early osteomyelitis of right fifth metatarsal and fifth distal phalanx.  CRP 20.  ESR 47.  Blood cultures NGTD.  MRSA PCR screen negative.  Superficial and deep wound culture with Streptococcus agalactiae.  ABI normal. ?-S/p I&D by Dr. Ashby Dawes on 3/28.  ?-Wound care per orthopedic surgery-recommended daily dressing change ?-Weightbearing on right heel per orthopedic surgery ?-3/28 vanc and Zosyn> 3/29 CTX and Flagyl>> Oritavancin 3/31>> Flagyl 3/31 to 4/6>> Augmentin for 5 days after 4/7 per infectious disease recommendation. ?-Not a candidate for home IV infusion given opiate dependence ?-Infectious disease to arrange outpatient follow-up in 2 weeks ?-Aspirin 325 mg twice daily for 3 weeks for VTE prophylaxis per orthopedic surgery ?-Heel weightbearing on RLE and daily dressing change per orthopedic surgery ?-Outpatient follow-up with orthopedic surgery in 7 days ?-Extensive discussion on the importance of good diabetic control ?

## 2022-01-24 NOTE — Assessment & Plan Note (Addendum)
A1c 11.2%.  Admits to noncompliance with his insulin.  ?Recent Labs  ?Lab 01/26/22 ?1631 01/26/22 ?2134 01/27/22 ?8295 01/27/22 ?1242 01/27/22 ?1246  ?GLUCAP 239* 271* 261* 335* 308*  ?-Increased home Lantus from 23 to 26 units daily ?-Continue home NovoLog 3 times daily ?-CBG monitoring 3 times a day before meals and CBG log ?-Counseled on adjustment of his long-acting insulin based on his CBG as above ?-Carb modified diet ?-Outpatient follow-up with PCP or endocrinologist in 1 to 2 weeks ?-May consider adding oral agents ?

## 2022-01-24 NOTE — ED Provider Notes (Signed)
?[facility] DEPT ?Provider Note ? ? ?CSN: [id number] ?Arrival date & time: 01/24/22  1234 ? ?  ? ?History ? ?Chief Complaint  ?Patient presents with  ? Wound Infection  ? ? ?[name] is a 31 y.o. male. ? ?31 y.o male with a PMH of DM presents to the ED with a chief complaint of right foot swollen x 1 week. Patient reports an ongoing callus that he has been covering with a a bandaid in addition noted a wound to the dorsum aspect of his foot, this has not been improving despite multiple PCP visits. He tried taking "antibiotics that he could find from some friend such as prednisone and meloxicam". He was evaluated by UC this morning and referred to the ED for IV antibiotics. He endorses worsening pain, chills, worsening gate. He arrived in the ED with a fever of 100.9. His blood sugar have been running around 300's at home, although according to last visits by PCP his A1C is 11.7. He denies any other symptoms. No trauma, no numbness or weakness.  ? ? ?The history is provided by the patient.  ? ?  ? ?Home Medications ?Prior to Admission medications   ?Medication Sig Start Date End Date Taking? Authorizing Provider  ?Blood Glucose Monitoring Suppl (TRUE METRIX METER) w/Device KIT USE AS DIRECTED 06/22/21   Angelica Pou, MD  ?buprenorphine-naloxone (SUBOXONE) 8-2 mg SUBL SL tablet Place 1 tablet under the tongue in the morning, at noon, and at bedtime. 01/03/22 02/02/22  Lajean Manes, MD  ?DULoxetine (CYMBALTA) 60 MG capsule Take 1 capsule (60 mg total) by mouth daily. 01/03/22   Lajean Manes, MD  ?gabapentin (NEURONTIN) 100 MG capsule Take 1 capsule (100 mg total) by mouth 3 (three) times daily. 01/03/22 12/29/22  Lajean Manes, MD  ?glucose blood (TRUE METRIX BLOOD GLUCOSE TEST) test strip Use as directed 01/03/22   Lajean Manes, MD  ?insulin aspart (NOVOLOG FLEXPEN) 100 UNIT/ML FlexPen Inject 7 Units into the skin 3 (three) times daily with meals. 12/05/21 03/05/22  Virl Axe, MD   ?Insulin Glargine Solostar (LANTUS) 100 UNIT/ML Solostar Pen Inject 23 Units into the skin daily. 12/05/21   Virl Axe, MD  ?   ? ?Allergies    ?Vicodin [hydrocodone-acetaminophen], Eggs or egg-derived products, and Lactose intolerance (gi)   ? ?Review of Systems   ?Review of Systems  ?Constitutional:  Positive for chills and fever.  ?HENT:  Negative for sore throat.   ?Respiratory:  Negative for shortness of breath.   ?Cardiovascular:  Negative for chest pain.  ?Gastrointestinal:  Negative for abdominal pain, diarrhea and vomiting.  ?Genitourinary:  Negative for flank pain.  ?Musculoskeletal:  Negative for back pain.  ?Neurological:  Negative for headaches.  ?All other systems reviewed and are negative. ? ?Physical Exam ?Updated Vital Signs ?BP 139/87 (BP Location: Right Arm)   Pulse 100   Temp (!) 100.9 ?F (38.3 ?C) (Oral)   Resp 18   SpO2 100%  ?Physical Exam ?Vitals and nursing note reviewed.  ?Constitutional:   ?   Appearance: Normal appearance.  ?HENT:  ?   Head: Normocephalic and atraumatic.  ?   Nose: Nose normal.  ?   Mouth/Throat:  ?   Mouth: Mucous membranes are moist.  ?Cardiovascular:  ?   Rate and Rhythm: Tachycardia present.  ?   Pulses:     ?     Popliteal pulses are 1+ on the right side.  ?     Dorsalis pedis pulses  are 1+ on the right side.  ?   Comments: DP, PT 1+ present. Please see photos attached. Foul odor with active purulent discharge.  ?Abdominal:  ?   General: Abdomen is flat.  ?   Tenderness: There is no abdominal tenderness. There is no right CVA tenderness or left CVA tenderness.  ?Musculoskeletal:     ?   General: Swelling and tenderness present.  ?   Cervical back: Normal range of motion and neck supple.  ?   Right lower leg: Edema present.  ?Skin: ?   General: Skin is warm and dry.  ?Neurological:  ?   Mental Status: He is alert.  ? ? ? ? ? ? ? ? ? ?ED Results / Procedures / Treatments   ?Labs ?(all labs ordered are listed, but only abnormal results are displayed) ?Labs  Reviewed  ?COMPREHENSIVE METABOLIC PANEL - Abnormal; Notable for the following components:  ?    Result Value  ? Sodium 134 (*)   ? Chloride 96 (*)   ? Glucose, Bld 239 (*)   ? Creatinine, Ser 0.54 (*)   ? AST 13 (*)   ? Total Bilirubin 1.6 (*)   ? All other components within normal limits  ?CBC WITH DIFFERENTIAL/PLATELET - Abnormal; Notable for the following components:  ? WBC 16.6 (*)   ? Neutro Abs 14.0 (*)   ? Abs Immature Granulocytes 0.09 (*)   ? All other components within normal limits  ?CULTURE, BLOOD (ROUTINE X 2)  ?CULTURE, BLOOD (ROUTINE X 2)  ?AEROBIC CULTURE W GRAM STAIN (SUPERFICIAL SPECIMEN)  ?LACTIC ACID, PLASMA  ?LACTIC ACID, PLASMA  ?HEMOGLOBIN A1C  ?SEDIMENTATION RATE  ?C-REACTIVE PROTEIN  ?PREALBUMIN  ? ? ?EKG ?None ? ?Radiology ?DG Foot Complete Right ? ?Result Date: 01/24/2022 ?CLINICAL DATA:  Concern for osteomyelitis. EXAM: RIGHT FOOT COMPLETE - 3+ VIEW COMPARISON:  None. FINDINGS: No cortical erosion or periosteal reaction. No evidence of fracture or dislocation. Multiple pockets of gas and prominent subcutaneous soft tissue swelling about the head of the fifth metatarsal, representing infectious/inflammatory process. IMPRESSION: 1. Soft tissue swelling and multiple pockets of gas about the head of the fifth metatarsal consistent with infectious/inflammatory process. 2. No cortical erosion or periosteal reaction, to suggest osteomyelitis, evaluation of early osteomyelitis is however limited on radiographs. MRI examination is recommended for further evaluation. Electronically Signed   By: Keane Police D.O.   On: 01/24/2022 14:51   ? ?Procedures ?Procedures  ? ? ?Medications Ordered in ED ?Medications  ?vancomycin (VANCOREADY) IVPB 2000 mg/400 mL (2,000 mg Intravenous New Bag/Given 01/24/22 1601)  ?insulin aspart (novoLOG) injection 0-9 Units (has no administration in time range)  ?metroNIDAZOLE (FLAGYL) tablet 500 mg (has no administration in time range)  ?ibuprofen (ADVIL) tablet 800 mg (800  mg Oral Given 01/24/22 1522)  ?piperacillin-tazobactam (ZOSYN) IVPB 3.375 g (0 g Intravenous Stopped 01/24/22 1611)  ? ? ?ED Course/ Medical Decision Making/ A&P ?Clinical Course as of 01/24/22 1722  ?Tue Jan 24, 5446  ?148 31 year old male with diabetes here for worsening right foot swelling.  Has had calluses therefore months but acutely worsened over the last few days.  Does have an elevated white count.  X-ray does not show definite osteo-.  Getting IV antibiotics and will need admission for further management. [MB]  ?  ?Clinical Course User Index ?[MB] Hayden Rasmussen, MD  ? ?                        ?  Medical Decision Making ?Amount and/or Complexity of Data Reviewed ?Radiology: ordered. ? ?Risk ?Prescription drug management. ?Decision regarding hospitalization. ? ? ?This patient presents to the ED for concern of right foot pain, this involves a number of treatment options, and is a complaint that carries with it a high risk of complications and morbidity.  The differential diagnosis includes osteomyelitis, cellulitis, DVT.  ? ? ?Co morbidities: ?Discussed in HPI ? ? ?Brief History: ? ?Patient with a PMH of DM presents to the ED with a chief complaint of right foot pain x few weeks. Seen by his PCP who reportedly told him "you need to let this drain" but did not place him on any abx. He reports worsening pain, fever, along with drainage from the wound.  ? ?EMR reviewed including pt PMHx, past surgical history and past visits to ER.  ? ?See HPI for more details ? ? ?Lab Tests: ? ?I ordered and independently interpreted labs.  The pertinent results include:   ? ?Labs notable for a CBC with a  leukocytosis at 16.6,  hemoglobin is within normal limits. CMP with decrease in sodium, glucose around 239, has been running elevated for patient due to ongoing infection. Lactic acid is negative. Blood cultures are processing.  ? ? ?Imaging Studies: ? ?DG right foot: ?1. Soft tissue swelling and multiple pockets of gas  about the head  ?of the fifth metatarsal consistent with infectious/inflammatory  ?process.  ?2. No cortical erosion or periosteal reaction, to suggest  ?osteomyelitis, evaluation of early osteomyelitis is however limit

## 2022-01-24 NOTE — H&P (Signed)
H&P Update: ? ?-History and Physical Reviewed ? ?-Patient has been re-examined ? ?-No change in the plan of care ? ?-The risks and benefits were presented and reviewed. The risks due to recurrent/new/persistent infection, stiffness, nerve/vessel/tendon injury, nonunion/malunion, wound healing issues, development of arthritis, failure of this surgery, possibility of delayed definitive surgery including leaving wound open for later closure or ongoing wound packing/healing by secondary intention, need for further surgery, thromboembolic events, anesthesia/medical complications, amputation, death among others were discussed. The patient acknowledged the explanation, agreed to proceed with the plan and a consent was signed. ? ?[name] ? ?

## 2022-01-24 NOTE — Assessment & Plan Note (Addendum)
Patient reports taking Suboxone 3 times a day.  Confirmed a narcotic database. ?-Continue home Suboxone to 3 times a day ?

## 2022-01-24 NOTE — ED Provider Triage Note (Signed)
Emergency Medicine Provider Triage Evaluation Note ? ?[name] , a 31 y.o. male  was evaluated in triage.  Pt complains of right foot infection. Hx diabetes. Went to urgent care today and was told he needed IV antibiotics. Had x-ray done several days ago with no osteo. ? ?Review of Systems  ?Positive: Right foot pain, numbness ?Negative: Leg pain, fever ? ?Physical Exam  ?BP 139/87 (BP Location: Right Arm)   Pulse 100   Temp (!) 100.9 ?F (38.3 ?C) (Oral)   Resp 18   SpO2 100%  ?Gen:   Awake, no distress   ?Resp:  Normal effort  ?MSK:   Moves extremities without difficulty  ?Other:  Right foot wrapped, significantly swollen ? ?Medical Decision Making  ?Medically screening exam initiated at 2:26 PM.  Appropriate orders placed.  [name] was informed that the remainder of the evaluation will be completed by another provider, this initial triage assessment does not replace that evaluation, and the importance of remaining in the ED until their evaluation is complete. ? ? ?  ?Su Monks, PA-C ?01/24/22 1427 ? ?

## 2022-01-24 NOTE — Assessment & Plan Note (Addendum)
No acute exacerbation  

## 2022-01-24 NOTE — Anesthesia Preprocedure Evaluation (Signed)
Anesthesia Evaluation  ?Patient identified by MRN, date of birth, ID band ?Patient awake ? ? ? ?Reviewed: ?Allergy & Precautions, NPO status , Patient's Chart, lab work & pertinent test results ? ?Airway ?Mallampati: II ? ?TM Distance: >3 FB ?Neck ROM: Full ? ? ? Dental ?no notable dental hx. ? ?  ?Pulmonary ?asthma , Current Smoker,  ?  ?Pulmonary exam normal ?breath sounds clear to auscultation ? ? ? ? ? ? Cardiovascular ?hypertension, Normal cardiovascular exam ?Rhythm:Regular Rate:Normal ? ? ?  ?Neuro/Psych ?negative neurological ROS ? negative psych ROS  ? GI/Hepatic ?negative GI ROS, Neg liver ROS,   ?Endo/Other  ?diabetes, Poorly Controlled, Type 2, Insulin Dependent ? Renal/GU ?negative Renal ROS  ?negative genitourinary ?  ?Musculoskeletal ?negative musculoskeletal ROS ?(+)  ? Abdominal ?  ?Peds ?negative pediatric ROS ?(+)  Hematology ?negative hematology ROS ?(+)   ?Anesthesia Other Findings ? ? Reproductive/Obstetrics ?negative OB ROS ? ?  ? ? ? ? ? ? ? ? ? ? ? ? ? ?  ?  ? ? ? ? ? ? ? ? ?Anesthesia Physical ?Anesthesia Plan ? ?ASA: 3 and emergent ? ?Anesthesia Plan: General  ? ?Post-op Pain Management:   ? ?Induction: Intravenous ? ?PONV Risk Score and Plan: 1 and Ondansetron and Treatment may vary due to age or medical condition ? ?Airway Management Planned: LMA ? ?Additional Equipment:  ? ?Intra-op Plan:  ? ?Post-operative Plan: Extubation in OR ? ?Informed Consent: I have reviewed the patients History and Physical, chart, labs and discussed the procedure including the risks, benefits and alternatives for the proposed anesthesia with the patient or authorized representative who has indicated his/her understanding and acceptance.  ? ? ? ?Dental advisory given ? ?Plan Discussed with: CRNA and Surgeon ? ?Anesthesia Plan Comments:   ? ? ? ? ? ? ?Anesthesia Quick Evaluation ? ?

## 2022-01-24 NOTE — Discharge Instructions (Signed)
Please go to the nearest hospital for evaluation for IV antibiotics ?

## 2022-01-24 NOTE — ED Triage Notes (Signed)
Per pt, states he was sent over by the wound clinic-has an infection of the outside of his right foot-sent over for IV antibiotics-states sugar has been running high ?

## 2022-01-24 NOTE — Anesthesia Postprocedure Evaluation (Incomplete)
Anesthesia Post Note ? ?Patient: [name] ? ?Procedure(s) Performed: INCISION AND DRAINAGE ABSCESS (Right: Foot) ? ?  ? ?Anesthesia Post Evaluation ?No notable events documented. ? ?Last Vitals:  ?Vitals:  ? 01/24/22 1735 01/24/22 2000  ?BP: 124/74 130/73  ?Pulse: 100 97  ?Resp: 18 20  ?Temp: 36.8 ?C 36.7 ?C  ?SpO2: 100% 100%  ?  ?Last Pain:  ?Vitals:  ? 01/24/22 2000  ?TempSrc: Oral  ?PainSc:   ? ? ?  ?  ?  ?  ?  ?  ? ?Akiva Josey ? ? ? ? ?

## 2022-01-24 NOTE — H&P (Signed)
?History and Physical  ? ? ?Patient: [name] ION:[id number] DOB: [birth year] ?DOA: 01/24/2022 ?DOS: the patient was seen and examined on 01/24/2022 ?PCP: Gaylan Gerold, DO  ?Patient coming from: Home ? ?Chief Complaint:  ?Chief Complaint  ?Patient presents with  ? Wound Infection  ? ?HPI: [name] is a 31 y.o. male with medical history significant of DM type II on chronic insulin, presents with right foot infection that started 4 days prior to admission.  He relates that he has had a callus on his right foot for the last 4 months.  He develop right foot swelling, redness, developed a blister near his little toe 4 days prior to admission.  He took some prednisone from a friend 4 days ago.  He presents with worsening pain, redness, he has been having some drainage from the blister. ? ?He presented initially to the urgent care and was referred to the ED for admission for IV antibiotics. ? ?Evaluation in the ED: Found to be febrile with temperature 101.9, tachycardic heart rate 121, sodium 134, blood sugar 239, normal liver function test, white blood cell 16. ?ED physician sent culture from the wound. ? ?X-ray of the right foot:Soft tissue swelling and multiple pockets of gas about the head ?of the fifth metatarsal consistent with infectious/inflammatory process.  No cortical erosion or periosteal reaction, to suggest osteomyelitis, evaluation of early osteomyelitis is however limited on radiographs. MRI examination is recommended for further evaluation. ?  ? ?Patient mated for right foot infection, orthopedic will be consulted by ED physician, MRI has been ordered. ? ? ? ?  ?Review of Systems: As mentioned in the history of present illness. All other systems reviewed and are negative. ?Past Medical History:  ?Diagnosis Date  ? Asthma   ? HTN (hypertension) 07/25/2017  ? Lisinopril 93m Daily  ? Hyperglycemia 09/24/2017  ? [place]   ? "dx'd 06/2017"  ? ?Past Surgical History:   ?Procedure Laterality Date  ? ADENOIDECTOMY    ? LACERATION REPAIR Left   ? "stitched finger up"  ? TONSILLECTOMY    ? ?Social History:  reports that he has been smoking cigarettes. He has a 6.00 pack-year smoking history. He has never used smokeless tobacco. He reports current alcohol use. He reports current drug use. Drug: Marijuana. ? ?Allergies  ?Allergen Reactions  ? Vicodin [Hydrocodone-Acetaminophen] Anaphylaxis  ?  Tolerates tylenol with no allergy  ? Eggs Or Egg-Derived Products Nausea And Vomiting  ? Lactose Intolerance (Gi) Rash  ? ? ?History reviewed. No pertinent family history. ? ?Prior to Admission medications   ?Medication Sig Start Date End Date Taking? Authorizing Provider  ?Blood Glucose Monitoring Suppl (TRUE METRIX METER) w/Device KIT USE AS DIRECTED 06/22/21   WAngelica Pou MD  ?buprenorphine-naloxone (SUBOXONE) 8-2 mg SUBL SL tablet Place 1 tablet under the tongue in the morning, at noon, and at bedtime. 01/03/22 02/02/22  PLajean Manes MD  ?DULoxetine (CYMBALTA) 60 MG capsule Take 1 capsule (60 mg total) by mouth daily. 01/03/22   PLajean Manes MD  ?gabapentin (NEURONTIN) 100 MG capsule Take 1 capsule (100 mg total) by mouth 3 (three) times daily. 01/03/22 12/29/22  PLajean Manes MD  ?glucose blood (TRUE METRIX BLOOD GLUCOSE TEST) test strip Use as directed 01/03/22   PLajean Manes MD  ?insulin aspart (NOVOLOG FLEXPEN) 100 UNIT/ML FlexPen Inject 7 Units into the skin 3 (three) times daily with meals. 12/05/21 03/05/22  JVirl Axe MD  ?Insulin Glargine Solostar (LANTUS) 100 UNIT/ML Solostar Pen  Inject 23 Units into the skin daily. 12/05/21   Virl Axe, MD  ? ? ?Physical Exam: ?Vitals:  ? 01/24/22 1257 01/24/22 1735  ?BP: 139/87 124/74  ?Pulse: 100 100  ?Resp: 18 18  ?Temp: (!) 100.9 ?F (38.3 ?C) 98.3 ?F (36.8 ?C)  ?TempSrc: Oral Oral  ?SpO2: 100% 100%  ?Weight:  83.9 kg  ?Height:  '5\' 6"'  (1.676 m)  ? ?General; diaphoretic. NAD ?CVS; S 1, S 2  RRR, tachycardic.  ?Lung; CTA ?Abdomen; Soft, nt,  nd. Nr ?Extremities; Right foot with edema, redness, ulcer near fifth metatarsal, callus plantar aspect. Left foot with callus.  ? ?Data Reviewed: ? ?Cbc , bmet and X ray reviewed.  ? ?Assessment and Plan: ?* Foot infection ?Diabetic foot infection.  ?Admit to med-surgery.  ?Start IV fluids.  ?IV vancomycin, cefepime, flagyl.  ?Doppler LE>  ?MRI pending.  ?Ortho consulted by ED>  ?Will do  PRN tramadol for pain.  ? ?Asthma ?No acute exacerbation.  ?PRN albuterol.  ? ?Pseudohyponatremia ?In setting hyperglycemia.  ?Mild.  ? ?Type 2 diabetes mellitus with complication, with long-term current use of insulin ([place]) ?Continue with Lantus 20 units daily, 5 units meals coverage. SSI.  ?Check A1c.  ? ?Opioid use disorder, mild, in sustained remission, on maintenance therapy, abuse ([place]) ?Resume Suboxone ? ?Neuropathy ?Continue with gabapentin and Cymbalta ? ? ? ? ? Advance Care Planning:   Code Status: Prior Full code ? ?Consults: Orthopedic will be consulted by ED PA.  ? ?Family Communication: Care discussed with patient.  ? ?Severity of Illness: ?The appropriate patient status for this patient is OBSERVATION. Observation status is judged to be reasonable and necessary in order to provide the required intensity of service to ensure the patient's safety. The patient's presenting symptoms, physical exam findings, and initial radiographic and laboratory data in the context of their medical condition is felt to place them at decreased risk for further clinical deterioration. Furthermore, it is anticipated that the patient will be medically stable for discharge from the hospital within 2 midnights of admission.  ? ?Author: ?Elmarie Shiley, MD ?01/24/2022 5:41 PM ? ?For on call review [email or web address].  ?

## 2022-01-24 NOTE — Progress Notes (Signed)
PHARMACY -  BRIEF ANTIBIOTIC NOTE  ? ?Pharmacy has received consult(s) for vancomycin from an ED provider.  The patient's profile has been reviewed for ht/wt/allergies/indication/available labs.   ? ?One time order(s) placed for vancomycin 2 g ? ?Further antibiotics/pharmacy consults should be ordered by admitting physician if indicated.       ?                ?Thank you, ? ?Pricilla Riffle, PharmD, BCPS ?Clinical Pharmacist ?01/24/2022 3:44 PM ? ? ?

## 2022-01-24 NOTE — ED Notes (Signed)
Hold patient in ED until he is transported to OR for surgery, per Janeece Fitting, PA-C ?

## 2022-01-24 NOTE — ED Notes (Signed)
Patient is being discharged from the Urgent Care and sent to the Emergency Department via personal vehicle with family . Per Provider Lowella Petties, patient is in need of higher level of care due to abnormal swelling & vitals. Patient is aware and verbalizes understanding of plan of care.  ?Vitals:  ? 01/24/22 1128  ?BP: (!) 142/83  ?Pulse: (!) 121  ?Resp: 18  ?Temp: 99.2 ?F (37.3 ?C)  ?SpO2: 99%  ?  ?

## 2022-01-24 NOTE — Progress Notes (Signed)
Pharmacy Antibiotic Note ? ?[name] is a 31 y.o. male admitted on 01/24/2022 with  diabetic foot infection .  Pharmacy has been consulted for vancomycin and cefepime  dosing. ? ?Pt with PMH of DM2. Imagine shows soft tissue swelling and multiple pockets of gas about the head of the fifth metatarsal consistent with infectious/inflammatory process.  ? ?Plan: ?Vancomycin 2000 mg IV x1 then 1500 mg IV q12h ( CrCl capped at 120 ml/min, wt 83.9 kg)  ?Cefepime 2 gr IV q8h  ?Metronidazole 500 mg PO q12h ( per MD )  ? ?Height: 5\' 6"  (167.6 cm) ?Weight: 83.9 kg (185 lb) ?IBW/kg (Calculated) : 63.8 ? ?Temp (24hrs), Avg:99.5 ?F (37.5 ?C), Min:98.3 ?F (36.8 ?C), Max:100.9 ?F (38.3 ?C) ? ?Recent Labs  ?Lab 01/21/22 ?0022 01/21/22 ?0023 01/24/22 ?1411  ?WBC 10.8*  --  16.6*  ?CREATININE 0.58*  --  0.54*  ?LATICACIDVEN  --  0.7 0.9  ?  ?Estimated Creatinine Clearance: 138.4 mL/min (A) (by C-G formula based on SCr of 0.54 mg/dL (L)).   ? ?Allergies  ?Allergen Reactions  ? Vicodin [Hydrocodone-Acetaminophen] Anaphylaxis  ?  Tolerates tylenol with no allergy  ? Eggs Or Egg-Derived Products Nausea And Vomiting  ? Lactose Intolerance (Gi) Rash  ? ? ?Antimicrobials this admission: ?3/28 Zosyn x1 in ED  ?3/28 vancomycin >>  ?3/28 cefepime >>  ?3/28 metronidazole >>  ? ? ?Dose adjustments this admission: ? ? ?Microbiology results: ?3/28 wound culture >>  ?  ? ?Thank you for allowing pharmacy to be a part of this patient?s care. ? ? ?4/28, PharmD, BCPS ?01/24/2022 5:52 PM ? ? ?

## 2022-01-24 NOTE — Assessment & Plan Note (Addendum)
Corrects to normal for hyperglycemia. ?

## 2022-01-24 NOTE — Brief Op Note (Signed)
01/24/2022 ? ?9:49 PM ? ?PATIENT:  [name]  31 y.o. male ? ?PRE-OPERATIVE DIAGNOSIS:  ABCESS RIGHT FOOT ? ?POST-OPERATIVE DIAGNOSIS:  ABCESS RIGHT FOOT ? ?PROCEDURE:   ?Right foot incision and drainage with loose primary closure ? ?SURGEON:  Surgeon(s) and Role: ?   Netta Cedars, MD - Primary ? ?PHYSICIAN ASSISTANT: None ? ?ASSISTANTS: none  ? ?ANESTHESIA:   general ? ?EBL:  1 mL  ? ?BLOOD ADMINISTERED:none ? ?DRAINS: none  ? ?LOCAL MEDICATIONS USED:  OTHER Vancomycin powder ? ?SPECIMEN:  Source of Specimen:  Right foot abscess and surrounding soft tissue ? ?DISPOSITION OF SPECIMEN:   Path and Micro ? ?COUNTS:  YES ? ?TOURNIQUET:  None used ? ?DICTATION: .Note written in EPIC ? ?PLAN OF CARE:  Return to inpatient ? ?PATIENT DISPOSITION:  PACU - hemodynamically stable. ?  ?Delay start of Pharmacological VTE agent (>24hrs) due to surgical blood loss or risk of bleeding: no ? ?

## 2022-01-24 NOTE — ED Triage Notes (Signed)
Pt reports for 3 days has swelling and redness to right foot. Reports that he went to ED couple days ago but wait was too long. Pt reports that is a diabetic.  ?

## 2022-01-24 NOTE — ED Notes (Signed)
Patient transported to MRI 

## 2022-01-25 ENCOUNTER — Other Ambulatory Visit (HOSPITAL_COMMUNITY): Payer: Self-pay

## 2022-01-25 ENCOUNTER — Inpatient Hospital Stay (HOSPITAL_COMMUNITY): Payer: Self-pay

## 2022-01-25 ENCOUNTER — Encounter (HOSPITAL_COMMUNITY): Payer: Self-pay | Admitting: Orthopaedic Surgery

## 2022-01-25 DIAGNOSIS — A419 Sepsis, unspecified organism: Secondary | ICD-10-CM

## 2022-01-25 DIAGNOSIS — E11628 Type 2 diabetes mellitus with other skin complications: Secondary | ICD-10-CM

## 2022-01-25 DIAGNOSIS — Z794 Long term (current) use of insulin: Secondary | ICD-10-CM

## 2022-01-25 DIAGNOSIS — F1111 Opioid abuse, in remission: Secondary | ICD-10-CM

## 2022-01-25 DIAGNOSIS — M86171 Other acute osteomyelitis, right ankle and foot: Secondary | ICD-10-CM

## 2022-01-25 DIAGNOSIS — L0390 Cellulitis, unspecified: Secondary | ICD-10-CM

## 2022-01-25 DIAGNOSIS — E118 Type 2 diabetes mellitus with unspecified complications: Secondary | ICD-10-CM

## 2022-01-25 DIAGNOSIS — R7989 Other specified abnormal findings of blood chemistry: Secondary | ICD-10-CM

## 2022-01-25 DIAGNOSIS — G629 Polyneuropathy, unspecified: Secondary | ICD-10-CM

## 2022-01-25 DIAGNOSIS — J4520 Mild intermittent asthma, uncomplicated: Secondary | ICD-10-CM

## 2022-01-25 LAB — CBC
HCT: 40.7 % (ref 39.0–52.0)
Hemoglobin: 14.2 g/dL (ref 13.0–17.0)
MCH: 29.3 pg (ref 26.0–34.0)
MCHC: 34.9 g/dL (ref 30.0–36.0)
MCV: 84.1 fL (ref 80.0–100.0)
Platelets: 261 10*3/uL (ref 150–400)
RBC: 4.84 MIL/uL (ref 4.22–5.81)
RDW: 11.4 % — ABNORMAL LOW (ref 11.5–15.5)
WBC: 16.3 10*3/uL — ABNORMAL HIGH (ref 4.0–10.5)
nRBC: 0 % (ref 0.0–0.2)

## 2022-01-25 LAB — BASIC METABOLIC PANEL
Anion gap: 7 (ref 5–15)
BUN: 12 mg/dL (ref 6–20)
CO2: 26 mmol/L (ref 22–32)
Calcium: 8.3 mg/dL — ABNORMAL LOW (ref 8.9–10.3)
Chloride: 100 mmol/L (ref 98–111)
Creatinine, Ser: 0.53 mg/dL — ABNORMAL LOW (ref 0.61–1.24)
GFR, Estimated: >60 mL/min (ref >60)
Glucose, Bld: 296 mg/dL — ABNORMAL HIGH (ref 70–99)
Potassium: 3.8 mmol/L (ref 3.5–5.1)
Sodium: 133 mmol/L — ABNORMAL LOW (ref 135–145)

## 2022-01-25 LAB — HEMOGLOBIN A1C
Hgb A1c MFr Bld: 11.2 % — ABNORMAL HIGH (ref 4.8–5.6)
Mean Plasma Glucose: 274.74 mg/dL

## 2022-01-25 LAB — C-REACTIVE PROTEIN: CRP: 19.7 mg/dL — ABNORMAL HIGH (ref <1.0)

## 2022-01-25 LAB — GLUCOSE, CAPILLARY
Glucose-Capillary: 103 mg/dL — ABNORMAL HIGH (ref 70–99)
Glucose-Capillary: 159 mg/dL — ABNORMAL HIGH (ref 70–99)
Glucose-Capillary: 207 mg/dL — ABNORMAL HIGH (ref 70–99)
Glucose-Capillary: 282 mg/dL — ABNORMAL HIGH (ref 70–99)
Glucose-Capillary: 450 mg/dL — ABNORMAL HIGH (ref 70–99)

## 2022-01-25 LAB — SEDIMENTATION RATE: Sed Rate: 47 mm/hr — ABNORMAL HIGH (ref 0–16)

## 2022-01-25 LAB — PREALBUMIN: Prealbumin: 8.5 mg/dL — ABNORMAL LOW (ref 18–38)

## 2022-01-25 LAB — HIV ANTIBODY (ROUTINE TESTING W REFLEX): HIV Screen 4th Generation wRfx: NONREACTIVE

## 2022-01-25 MED ORDER — SODIUM CHLORIDE 0.9 % IV SOLN
2.0000 g | INTRAVENOUS | Status: DC
  Administered 2022-01-25 – 2022-01-26 (×2): 2 g via INTRAVENOUS
  Filled 2022-01-25 (×3): qty 20

## 2022-01-25 MED ORDER — INSULIN ASPART 100 UNIT/ML IJ SOLN
0.0000 [IU] | Freq: Every day | INTRAMUSCULAR | Status: DC
  Administered 2022-01-26: 3 [IU] via SUBCUTANEOUS

## 2022-01-25 MED ORDER — ENSURE MAX PROTEIN PO LIQD
11.0000 [oz_av] | Freq: Two times a day (BID) | ORAL | Status: DC
  Administered 2022-01-25 – 2022-01-27 (×5): 11 [oz_av] via ORAL
  Filled 2022-01-25 (×6): qty 330

## 2022-01-25 MED ORDER — ADULT MULTIVITAMIN W/MINERALS CH
1.0000 | ORAL_TABLET | Freq: Every day | ORAL | Status: DC
  Administered 2022-01-25 – 2022-01-27 (×3): 1 via ORAL
  Filled 2022-01-25 (×3): qty 1

## 2022-01-25 MED ORDER — INSULIN ASPART 100 UNIT/ML IJ SOLN
7.0000 [IU] | Freq: Three times a day (TID) | INTRAMUSCULAR | Status: DC
  Administered 2022-01-25 – 2022-01-26 (×5): 7 [IU] via SUBCUTANEOUS

## 2022-01-25 MED ORDER — INSULIN ASPART 100 UNIT/ML IJ SOLN
0.0000 [IU] | Freq: Three times a day (TID) | INTRAMUSCULAR | Status: DC
  Administered 2022-01-25: 3 [IU] via SUBCUTANEOUS
  Administered 2022-01-25: 5 [IU] via SUBCUTANEOUS
  Administered 2022-01-25: 15 [IU] via SUBCUTANEOUS
  Administered 2022-01-26: 8 [IU] via SUBCUTANEOUS
  Administered 2022-01-26: 5 [IU] via SUBCUTANEOUS
  Administered 2022-01-26: 3 [IU] via SUBCUTANEOUS
  Administered 2022-01-27: 8 [IU] via SUBCUTANEOUS

## 2022-01-25 MED ORDER — BUPRENORPHINE HCL-NALOXONE HCL 8-2 MG SL SUBL
1.0000 | SUBLINGUAL_TABLET | Freq: Three times a day (TID) | SUBLINGUAL | Status: DC
  Administered 2022-01-25 – 2022-01-27 (×7): 1 via SUBLINGUAL
  Filled 2022-01-25 (×7): qty 1

## 2022-01-25 NOTE — Hospital Course (Addendum)
31 year old M with PMH of IDDM-2, HTN, opioid use disorder on Suboxone, and asthma presenting with right foot infection that has started 4 days prior to presentation, and admitted for right foot cellulitis with abscess and early osteomyelitis and right fifth metatarsal and fifth proximal phalanx.  He was febrile to 100.9.  Slightly tachycardic.  Had leukocytosis to 16.6 with left shift.  Lactic acid negative.  CRP elevated to 20.  ESR 47.  Blood cultures obtained.  Started on broad-spectrum antibiotics.  Orthopedic surgery consulted.  Underwent right foot I&D of an abscess on 3/28. ? ?Superficial and deep wound culture Streptococcus agalactia. Infectious disease consulted.  De-escalated antibiotics to IV ceftriaxone and p.o. Flagyl.  Given history of opiate dependence, not a candidate for home IV infusion.  Received one dose of Oritavancin IV on the day of discharge.  He is discharged on 7 days of metronidazole until 4/6 followed by Augmentin for approx 5 weeks from 4/7 per ID recommendation.  ID to arrange outpatient follow-up in 2 weeks.  Patient to follow-up with orthopedic surgery in 1 week.  DVT PPx, dressing change, wound care and weightbearing per orthopedic surgery. ? ?In regards to uncontrolled diabetes, home Lantus increased from 23 to 26 units.  Patient to continue home short acting insulin 3 times daily with meals.  He has been advised to check his blood glucose 3 times a day 15 minutes before meals and keep blood glucose log.  Also advised to increase his Lantus by 2 units the next day if his Premeal blood glucose results where persistently higher than 180.  He was counseled on the importance of good diabetic control for wound healing and infection control.  Encouraged to follow-up with his primary care doctor or endocrinologist for further adjustment and evaluation of his diabetes. ?

## 2022-01-25 NOTE — Progress Notes (Signed)
ABI has been completed. ? ?Results can be found under chart review under CV PROC. ?01/25/2022 12:35 PM ?Allexa Acoff RVT, RDMS ? ?

## 2022-01-25 NOTE — Consult Note (Signed)
WOC Nurse Consult Note: ?Reason for Consult:LE Wound ?Wound type: Infectious ?Pressure Injury POA: N/A ? ?WOC Nursing was simultaneously consulted with Orthopedics. Dr.Ramanathan saw patient last evening and performed an operative procedure (I&D with soft tissue debridement) to the right foot. ? ?Post operative care per Dr. Odis Hollingshead. ? ?WOC nursing team will not follow, but will remain available to this patient, the nursing and medical teams.  Please re-consult if needed. ?Thanks, ?Ladona Mow, MSN, RN, GNP, CWOCN, CWON-AP, FAAN  ?Pager# [phone]  ? ? ?  ?

## 2022-01-25 NOTE — Progress Notes (Signed)
?PROGRESS NOTE ? ?[name] SHF:[id number] DOB: 01/18/1991  ? ?PCP: Gaylan Gerold, DO ? ?Patient is from: Home. ? ?DOA: 01/24/2022 LOS: 1 ? ?Chief complaints ?Chief Complaint  ?Patient presents with  ? Wound Infection  ?  ? ?Brief Narrative / Interim history: ?31 year old M with PMH of IDDM-2, HTN, opioid use disorder on Suboxone, and asthma presenting with right foot infection that has started 4 days prior to presentation, and admitted for right foot cellulitis with abscess and early osteomyelitis and right fifth metatarsal and fifth proximal phalanx.  He was febrile to 100.9.  Slightly tachycardic.  Had leukocytosis to 16.6 with left shift.  Lactic acid negative.  CRP elevated to 20.  ESR 47.  Blood cultures obtained.  Started on broad-spectrum antibiotics.  Orthopedic surgery consulted.  Underwent right foot I&D of an abscess on 3/28. ? ?Superficial wound culture Streptococcus agalactia.  Deep abscess culture with few GPR and moderate GPC's  ? ?Subjective: ?Seen and examined earlier this morning.  No major events overnight of this morning.  Reports significant pain in his foot.  He rates his pain 8/10.  Describes the pain as throbbing.  He says he takes Suboxone 3 times a day.  Denies chest pain, dyspnea, GI or UTI symptoms. ? ?Objective: ?Vitals:  ? 01/24/22 2230 01/24/22 2246 01/25/22 0039 01/25/22 0434  ?BP: 128/78 127/80 136/78 134/75  ?Pulse: 85 86 100 (!) 104  ?Resp: _0 ?Temp: 98.3 ?F (36.8 ?C) 98 ?F (36.7 ?C) 98.8 ?F (37.1 ?C) 98.8 ?F (37.1 ?C)  ?TempSrc:  Oral  Oral  ?SpO2: 100% 100% 100% 98%  ?Weight:  78.4 kg    ?Height:  _1  (1.676 m)    ? ? ?Examination: ? ?GENERAL: No apparent distress.  Nontoxic. ?HEENT: MMM.  Vision and hearing grossly intact.  ?NECK: Supple.  No apparent JVD.  ?RESP:  No IWOB.  Fair aeration bilaterally. ?CVS:  RRR. Heart sounds normal.  ?ABD/GI/GU: BS+. Abd soft, NTND.  ?MSK/EXT:  Moves extremities.  Bulky dressing over right foot.  Intact sensation in his  toes. ?SKIN: Bulky dressing over right foot. ?NEURO: Awake, alert and oriented appropriately.  No apparent focal neuro deficit. ?PSYCH: Calm. Normal affect.  ? ?Procedures:  ?3/28-I&D of right foot infection ? ?Microbiology summarized: ?3/28-blood cultures NGTD. ?3/28-superficial wound culture with Streptococcus agalactia/GPC's ?3/28-deep abscess culture with few GPR and moderate GPC's ? ?Assessment and Plan: ?* Right foot cellulitis with abscess and early osteomyelitis in patient with uncontrolled diabetes ?MRI concerning for right foot cellulitis with possible necrotizing infection and early osteomyelitis of right fifth metatarsal and fifth distal phalanx.  CRP 20.  ESR 47.  Blood cultures NGTD.  Superficial wound culture with Streptococcus agalactiae.  Deep abscess culture with few GPR and moderate GPC's. ?-S/p I&D on 3/28.  ?-Follow further culture speciation and sensitivity ?-Continue vancomycin, cefepime and Flagyl pending ID input.  ?-Follow ABI ?-Pain control and VTE prophylaxis per surgery ? ?Sepsis due to diabetic right foot infection/osteomyelitis ?See above ? ?Uncontrolled IDDM-2 with hyperglycemia, neuropathy and diabetic foot ulcer infection ?A1c 11.2%.  Suspect noncompliance.  ?Recent Labs  ?Lab 01/21/22 ?0025 01/24/22 ?2158 01/25/22 ?5027 01/25/22 ?7412  ?GLUCAP 284* 259* 282* 450*  ?-Increase SSI to moderate ?-Increase mealtime coverage from 5 to 7 units 3 times daily ?-Continue basal insulin at 23 units daily ?-Monitor CBG and adjust insulin as appropriate ?-Consult diabetic coordinator ? ? ?Pseudohyponatremia ?Corrects to normal for hyperglycemia. ? ?Opioid use disorder, mild, in sustained remission, on  maintenance therapy, abuse ([place]) ?Patient reports taking Suboxone 3 times a day.  Confirmed a narcotic database. ?-Increase Suboxone to 3 times a day ? ?Neuropathy ?Continue with gabapentin, Cymbalta and tramadol ?Increase Suboxone to home dose. ? ?Asthma ?No acute exacerbation.  ?PRN albuterol.   ? ? ? ?  ?DVT prophylaxis:  ?enoxaparin (LOVENOX) injection 40 mg Start: 01/25/22 1200 ?SCDs Start: 01/24/22 2251 ? ?Code Status: Full code ?Family Communication: Patient and/or RN. Available if any question.  ?Level of care: Med-Surg ?Status is: Inpatient ?Remains inpatient appropriate because: Sepsis from diabetic right foot infection/osteomyelitis requiring IV antibiotics ? ? ?Final disposition: TBD ? ?Consultants:  ?Orthopedic surgery ?Infectious disease ? ?Sch Meds:  ?Scheduled Meds: ? buprenorphine-naloxone  1 tablet Sublingual TID  ? docusate sodium  100 mg Oral BID  ? DULoxetine  60 mg Oral Daily  ? enoxaparin (LOVENOX) injection  40 mg Subcutaneous Q24H  ? gabapentin  100 mg Oral TID  ? insulin aspart  0-15 Units Subcutaneous TID WC  ? insulin aspart  0-5 Units Subcutaneous QHS  ? insulin aspart  7 Units Subcutaneous TID WC  ? insulin glargine-yfgn  23 Units Subcutaneous Daily  ? metroNIDAZOLE  500 mg Oral Q12H  ? nicotine  14 mg Transdermal Daily  ? ?Continuous Infusions: ? ceFEPime (MAXIPIME) IV 2 g (01/25/22 0525)  ? vancomycin 1,500 mg (01/25/22 6761)  ? ?PRN Meds:.ibuprofen, ondansetron **OR** ondansetron (ZOFRAN) IV, traMADol ? ?Antimicrobials: ?Anti-infectives (From admission, onward)  ? ? Start     Dose/Rate Route Frequency Ordered Stop  ? 01/25/22 0600  vancomycin (VANCOREADY) IVPB 1500 mg/300 mL       ? 1,500 mg ?150 mL/hr over 120 Minutes Intravenous Every 12 hours 01/24/22 1750    ? 01/24/22 2200  metroNIDAZOLE (FLAGYL) tablet 500 mg       ? 500 mg Oral Every 12 hours 01/24/22 1721 01/31/22 2159  ? 01/24/22 2200  ceFEPIme (MAXIPIME) 2 g in sodium chloride 0.9 % 100 mL IVPB       ? 2 g ?200 mL/hr over 30 Minutes Intravenous Every 8 hours 01/24/22 1750    ? 01/24/22 2138  vancomycin (VANCOCIN) powder  Status:  Discontinued       ?   As needed 01/24/22 2139 01/24/22 2239  ? 01/24/22 2030  ceFAZolin (ANCEF) IVPB 2g/100 mL premix       ? 2 g ?200 mL/hr over 30 Minutes Intravenous  Once 01/24/22 2028  01/24/22 2050  ? 01/24/22 2028  ceFAZolin (ANCEF) 2-4 GM/100ML-% IVPB       ?Note to Pharmacy: Cynda Familia: cabinet override  ?    01/24/22 2028 01/24/22 2101  ? 01/24/22 1545  piperacillin-tazobactam (ZOSYN) IVPB 3.375 g       ? 3.375 g ?100 mL/hr over 30 Minutes Intravenous  Once 01/24/22 1540 01/24/22 1611  ? 01/24/22 1545  vancomycin (VANCOREADY) IVPB 2000 mg/400 mL       ? 2,000 mg ?200 mL/hr over 120 Minutes Intravenous  Once 01/24/22 1542 01/24/22 1814  ? ?  ? ? ? ?I have personally reviewed the following labs and images: ?CBC: ?Recent Labs  ?Lab 01/21/22 ?0022 01/24/22 ?1411 01/25/22 ?0305  ?WBC 10.8* 16.6* 16.3*  ?NEUTROABS 6.7 14.0*  --   ?HGB 15.1 15.0 14.2  ?HCT 43.5 43.9 40.7  ?MCV 85.3 85.9 84.1  ?PLT 249 254 261  ? ?BMP &GFR ?Recent Labs  ?Lab 01/21/22 ?0022 01/24/22 ?1411 01/25/22 ?0305  ?NA 134* 134* 133*  ?K 4.5  4.2 3.8  ?CL 99 96* 100  ?CO2 _0 ?GLUCOSE 336* 239* 296*  ?BUN 5* 7 12  ?CREATININE 0.58* 0.54* 0.53*  ?CALCIUM 9.0 9.0 8.3*  ? ?Estimated Creatinine Clearance: 134.1 mL/min (A) (by C-G formula based on SCr of 0.53 mg/dL (L)). ?Liver & Pancreas: ?Recent Labs  ?Lab 01/24/22 ?1411  ?AST 13*  ?ALT 14  ?ALKPHOS 106  ?BILITOT 1.6*  ?PROT 8.1  ?ALBUMIN 3.8  ? ?No results for input(s): LIPASE, AMYLASE in the last 168 hours. ?No results for input(s): AMMONIA in the last 168 hours. ?Diabetic: ?Recent Labs  ?  01/24/22 ?2307  ?HGBA1C 11.2*  ? ?Recent Labs  ?Lab 01/21/22 ?0025 01/24/22 ?2158 01/25/22 ?1836 01/25/22 ?7255  ?GLUCAP 284* 259* 282* 450*  ? ?Cardiac Enzymes: ?No results for input(s): CKTOTAL, CKMB, CKMBINDEX, TROPONINI in the last 168 hours. ?No results for input(s): PROBNP in the last 8760 hours. ?Coagulation Profile: ?No results for input(s): INR, PROTIME in the last 168 hours. ?Thyroid Function Tests: ?No results for input(s): TSH, T4TOTAL, FREET4, T3FREE, THYROIDAB in the last 72 hours. ?Lipid Profile: ?No results for input(s): CHOL, HDL, LDLCALC, TRIG, CHOLHDL, LDLDIRECT  in the last 72 hours. ?Anemia Panel: ?No results for input(s): VITAMINB12, FOLATE, FERRITIN, TIBC, IRON, RETICCTPCT in the last 72 hours. ?Urine analysis: ?   ?Component Value Date/Time  ? COLORURINE COLORL

## 2022-01-25 NOTE — TOC Initial Note (Signed)
Transition of Care (TOC) - Initial/Assessment Note  ? ? ?Patient Details  ?Name: [name] ?MRN: [id number] ?Date of Birth: 10-Sep-1991 ? ?Transition of Care (TOC) CM/SW Contact:    ?Cecille Po, RN ?Phone Number: ?01/25/2022, 3:02 PM ? ?Clinical Narrative:                 ? ?Spoke with patient at bedside.  He lives alone.  He has his own transportation.  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  States he had to stand on his feet all day on concrete.   ?He agreed for CM to schedule him a PCP appt.  Scheduled an appt with [facility] for 02/16/22 at 9:30 am.  Explained that they can assist him with medications.  Let patient know the appt time and location would be on his discharge paperwork and he verbalized understanding.  ? ? ? ?Expected Discharge Plan: Home/Self Care ?Barriers to Discharge: Inadequate or no insurance, Continued Medical Work up ? ? ?Patient Goals and CMS Choice ?Patient states their goals for this hospitalization and ongoing recovery are:: return home ?  ?  ? ?Expected Discharge Plan and Services ?Expected Discharge Plan: Home/Self Care ?  ?  ?  ?Living arrangements for the past 2 months: Single Family Home ?                ? Prior Living Arrangements/Services ?Living arrangements for the past 2 months: Single Family Home ?Lives with:: Self ?Patient language and need for interpreter reviewed:: Yes ?Do you feel safe going back to the place where you live?: Yes      ?Need for Family Participation in Patient Care: No (Comment) ?Care giver support system in place?: Yes (comment) ?  ?Criminal Activity/Legal Involvement Pertinent to Current Situation/Hospitalization: No - Comment as needed ? ?Activities of Daily Living ?Home Assistive Devices/Equipment: None ?ADL Screening (condition at time of admission) ?Patient's cognitive ability adequate to safely complete daily activities?: Yes ?Is the patient deaf or have difficulty hearing?: No ?Does the patient have difficulty  seeing, even when wearing glasses/contacts?: No ?Does the patient have difficulty concentrating, remembering, or making decisions?: No ?Patient able to express need for assistance with ADLs?: Yes ?Does the patient have difficulty dressing or bathing?: No ?Independently performs ADLs?: Yes (appropriate for developmental age) ?Does the patient have difficulty walking or climbing stairs?: No ?Weakness of Legs: Right ?Weakness of Arms/Hands: None ? ?Emotional Assessment ?Appearance:: Appears stated age ?Attitude/Demeanor/Rapport: Engaged ?Affect (typically observed): Appropriate ?Orientation: : Oriented to Self, Oriented to Place, Oriented to  Time, Oriented to Situation ?Alcohol / Substance Use: Other (comment) (Recovering) ?Psych Involvement: No (comment) ? ?Admission diagnosis:  Foot infection [L08.9] ?Cellulitis of fifth toe of right foot [L03.031] ?Patient Active Problem List  ? Diagnosis Date Noted  ? Sepsis due to diabetic right foot infection/osteomyelitis 01/25/2022  ? Acute osteomyelitis of right foot (HCC) 01/25/2022  ? Right foot cellulitis with abscess and early osteomyelitis in patient with uncontrolled diabetes 01/24/2022  ? Uncontrolled IDDM-2 with hyperglycemia, neuropathy and diabetic foot ulcer infection 01/24/2022  ? Pseudohyponatremia 01/24/2022  ? Asthma   ? Callus 10/19/2021  ? Anxiety and depression 03/22/2021  ? Erectile dysfunction 09/15/2020  ? Polycythemia 08/19/2020  ? Opioid use disorder, mild, in sustained remission, on maintenance therapy, abuse (HCC) 08/05/2020  ? Neuropathy 07/23/2020  ? 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  ? Smoking 08/01/2017  ? ?PCP:  Cyndie Chime,  Cloretta Ned, DO ?Pharmacy:   ?[facility] ?[address] ?[place] [address] ?Phone: [phone] Fax: [phone] ? ? ?

## 2022-01-25 NOTE — Progress Notes (Signed)
Orthopedic Tech Progress Note ?Patient Details:  ?[name] ?[birth year] ?[id number] ? ?Patient ID: [name], male   DOB: September 21, 1991, 31 y.o.   MRN: [id number] ? ?[name] ?01/25/2022, 10:47 AM ?Right post op shoe applied ?

## 2022-01-25 NOTE — Progress Notes (Signed)
Subjective: ?1 Day Post-Op Procedure(s) (LRB): ?INCISION AND DRAINAGE ABSCESS (Right) ? ?Patient reports pain as mild. He feels much improved since surgery and reports he has better appetite and ability to move his leg. He has been stable on RNF and undergoing IV abx therapy with ID consult.  ? ?Objective:  ? ?VITALS:  Temp:  [98 ?F (36.7 ?C)-98.8 ?F (37.1 ?C)] 98.8 ?F (37.1 ?C) (03/29 0434) ?Pulse Rate:  [85-104] 104 (03/29 0434) ?Resp:  [10-20] 18 (03/29 0434) ?BP: (124-136)/(73-85) 134/75 (03/29 0434) ?SpO2:  [98 %-100 %] 98 % (03/29 0434) ?Weight:  [78.4 kg-83.9 kg] 78.4 kg (03/28 2246) ? ?Gen: AAOx3, NAD ?Comfortable at rest ? ?Right Lower Extremity: ?Skin intact, incision c/d/i, sutures in place ?Improved swelling and erythema in the right foot ?ADF/APF/EHL 5/5 ?SILT throughout ?DP, PT 2+ to palp ?CR < 2s ? ? ? ?LABS ?Recent Labs  ?  01/24/22 ?1411 01/25/22 ?0305  ?HGB 15.0 14.2  ?WBC 16.6* 16.3*  ?PLT 254 261  ? ?Recent Labs  ?  01/24/22 ?1411 01/25/22 ?0305  ?NA 134* 133*  ?K 4.2 3.8  ?CL 96* 100  ?CO2 26 26  ?BUN 7 12  ?CREATININE 0.54* 0.53*  ?GLUCOSE 239* 296*  ? ?No results for input(s): LABPT, INR in the last 72 hours. ? ? ?Assessment/Plan: ?1 Day Post-Op Procedure(s) (LRB): ?INCISION AND DRAINAGE ABSCESS (Right) ? ?-dressing changed at bedside, appropriate postop appearance. OK for nursing to do once daily/PRN dressing changes (dry sterile gauze with Kerlix wrap and tape) ?-heel weightbearing operative extremity in postop shoe, maximum elevation ?-continue broad spectrum IV antibiotics until final intraoperative cultures ?-ID consult; he will likely require prolonged outpatient antibiotic therapy given his MRI findings concerning for early osteomyelitis ?-optimize glycemic control and nutrition ?-DVT Ppx per primary team (Ortho recs Aspirin 325 mg BID x 3 wk) ?-follow up as outpatient in 7 days for wound check ?-sutures out in 3-4 weeks, discussed with patient and mother that if wound dehisces due to  poor skin quality, we will refer to wound care team ?  ? ?Armond Hang ?01/25/2022, 4:35 PM ? ?

## 2022-01-25 NOTE — Discharge Instructions (Addendum)
1 Day Post-Op Procedure(s) (LRB): ?INCISION AND DRAINAGE ABSCESS (Right) ?  ?-dressing: do once daily/PRN dressing changes (dry sterile gauze with Kerlix wrap and tape) ?-heel weightbearing operative extremity in postop shoe, maximum elevation ?-follow up as outpatient in 7 days for wound check ?-sutures out in 3-4 weeks, discussed with patient  if wound dehisces we will refer to wound care team ?  ? ?Carbohydrate Counting For People With Diabetes ? ?Foods with carbohydrates make your blood glucose level go up. Learning how to count carbohydrates can help you control your blood glucose levels. First, identify the foods you eat that contain carbohydrates. Then, using the Foods with Carbohydrates chart, determine about how much carbohydrates are in your meals and snacks. Make sure you are eating foods with fiber, protein, and healthy fat along with your carbohydrate foods. ?Foods with Carbohydrates ?The following table shows carbohydrate foods that have about 15 grams of carbohydrate each. Using measuring cups, spoons, or a food scale when you first begin learning about carbohydrate counting can help you learn about the portion sizes you typically eat. ?The following foods have 15 grams carbohydrate each:  ?Grains ?1 slice bread (1 ounce)  ?1 small tortilla (6-inch size)  ?? large bagel (1 ounce)  ?1/3 cup pasta or rice (cooked)  ?? hamburger or hot dog bun (? ounce)  ?? cup cooked cereal  ?? to ? cup ready-to-eat cereal  ?2 taco shells (5-inch size) Fruit ?1 small fresh fruit (? to 1 cup)  ?? medium banana  ?17 small grapes (3 ounces)  ?1 cup melon or berries  ?? cup canned or frozen fruit  ?2 tablespoons dried fruit (blueberries, cherries, cranberries, raisins)  ?? cup unsweetened fruit juice  ?Starchy Vegetables ?? cup cooked beans, peas, corn, potatoes/sweet potatoes  ?? large baked potato (3 ounces)  ?1 cup acorn or butternut squash  Snack Foods ?3 to 6 crackers  ?8 potato chips or 13 tortilla chips (? ounce to 1  ounce)  ?3 cups popped popcorn  ?Dairy ?3/4 cup (6 ounces) nonfat plain yogurt, or yogurt with sugar-free sweetener  ?1 cup milk  ?1 cup plain rice, soy, coconut or flavored almond milk Sweets and Desserts ?? cup ice cream or frozen yogurt  ?1 tablespoon jam, jelly, pancake syrup, table sugar, or honey  ?2 tablespoons light pancake syrup  ?1 inch square of frosted cake or 2 inch square of unfrosted cake  ?2 small cookies (2/3 ounce each) or ? large cookie  ?Sometimes you?ll have to estimate carbohydrate amounts if you don?t know the exact recipe. One cup of mixed foods like soups can have 1 to 2 carbohydrate servings, while some casseroles might have 2 or more servings of carbohydrate. ?Foods that have less than 20 calories in each serving can be counted as ?free? foods. Count 1 cup raw vegetables, or ? cup cooked non-starchy vegetables as ?free? foods. If you eat 3 or more servings at one meal, then count them as 1 carbohydrate serving.  ?Foods without Carbohydrates  ?Not all foods contain carbohydrates. Meat, some dairy, fats, non-starchy vegetables, and many beverages don?t contain carbohydrate. So when you count carbohydrates, you can generally exclude chicken, pork, beef, fish, seafood, eggs, tofu, cheese, butter, sour cream, avocado, nuts, seeds, olives, mayonnaise, water, black coffee, unsweetened tea, and zero-calorie drinks. Vegetables with no or low carbohydrate include green beans, cauliflower, tomatoes, and onions. ?How much carbohydrate should I eat at each meal?  ?Carbohydrate counting can help you plan your meals and manage  your weight. Following are some starting points for carbohydrate intake at each meal. Work with your registered dietitian nutritionist to find the best range that works for your blood glucose and weight.  ? To Lose Weight To Maintain Weight  ?Women 2 - 3 carb servings 3 - 4 carb servings  ?Men 3 - 4 carb servings 4 - 5 carb servings  ?Checking your blood glucose after meals will  help you know if you need to adjust the timing, type, or number of carbohydrate servings in your meal plan. Achieve and keep a healthy body weight by balancing your food intake and physical activity. ? ?Tips ?How should I plan my meals?  ?Plan for half the food on your plate to include non-starchy vegetables, like salad greens, broccoli, or carrots. Try to eat 3 to 5 servings of non-starchy vegetables every day. Have a protein food at each meal. Protein foods include chicken, fish, meat, eggs, or beans (note that beans contain carbohydrate). These two food groups (non-starchy vegetables and proteins) are low in carbohydrate. If you fill up your plate with these foods, you will eat less carbohydrate but still fill up your stomach. Try to limit your carbohydrate portion to ? of the plate.  ?What fats are healthiest to eat?  ?Diabetes increases risk for heart disease. To help protect your heart, eat more healthy fats, such as olive oil, nuts, and avocado. Eat less saturated fats like butter, cream, and high-fat meats, like bacon and sausage. Avoid trans fats, which are in all foods that list ?partially hydrogenated oil? as an ingredient. ?What should I drink?  ?Choose drinks that are not sweetened with sugar. The healthiest choices are water, carbonated or seltzer waters, and tea and coffee without added sugars.  ?Sweet drinks will make your blood glucose go up very quickly. One serving of soda or energy drink is ? cup. It is best to drink these beverages only if your blood glucose is low.  ?Artificially sweetened, or diet drinks, typically do not increase your blood glucose if they have zero calories in them. Read labels of beverages, as some diet drinks do have carbohydrate and will raise your blood glucose. ?Label Reading Tips ?Read Nutrition Facts labels to find out how many grams of carbohydrate are in a food you want to eat. Don?t forget: sometimes serving sizes on the label aren?t the same as how much food you  are going to eat, so you may need to calculate how much carbohydrate is in the food you are serving yourself.  ? ?Carbohydrate Counting for People with Diabetes Sample 1-Day Menu  ?Breakfast ? cup yogurt, low fat, low sugar (1 carbohydrate serving)  ?? cup cereal, ready-to-eat, unsweetened (1 carbohydrate serving)  ?1 cup strawberries (1 carbohydrate serving)  ?? cup almonds (? carbohydrate serving)  ?Lunch 1, 5 ounce can chunk light tuna  ?2 ounces cheese, low fat cheddar  ?6 whole wheat crackers (1 carbohydrate serving)  ?1 small apple (1? carbohydrate servings)  ?? cup carrots (? carbohydrate serving)  ?? cup snap peas  ?1 cup 1% milk (1 carbohydrate serving)   ?Evening Meal Stir fry made with: 3 ounces chicken  ?1 cup brown rice (3 carbohydrate servings)  ?? cup broccoli (? carbohydrate serving)  ?? cup green beans  ?? cup onions  ?1 tablespoon olive oil  ?2 tablespoons teriyaki sauce (? carbohydrate serving)  ?Evening Snack 1 extra small banana (1 carbohydrate serving)  ?1 tablespoon peanut butter  ? ?Carbohydrate Counting for People  with Diabetes Vegan Sample 1-Day Menu  ?Breakfast 1 cup cooked oatmeal (2 carbohydrate servings)  ?? cup blueberries (1 carbohydrate serving)  ?2 tablespoons flaxseeds  ?1 cup soymilk fortified with calcium and vitamin D  ?1 cup coffee  ?Lunch 2 slices whole wheat bread (2 carbohydrate servings)  ?? cup baked tofu  ?? cup lettuce  ?2 slices tomato  ?2 slices avocado  ?? cup baby carrots (? carbohydrate serving)  ?1 orange (1 carbohydrate serving)  ?1 cup soymilk fortified with calcium and vitamin D   ?Evening Meal Burrito made with: 1 6-inch corn tortilla (1 carbohydrate serving)  ?1 cup refried vegetarian beans (2 carbohydrate servings)  ?? cup chopped tomatoes  ?? cup lettuce  ?? cup salsa  ?1/3 cup brown rice (1 carbohydrate serving)  ?1 tablespoon olive oil for rice  ?? cup zucchini   ?Evening Snack 6 small whole grain crackers (1 carbohydrate serving)  ?2 apricots (?  carbohydrate serving)  ?? cup unsalted peanuts (? carbohydrate serving)   ? ?Carbohydrate Counting for People with Diabetes Vegetarian (Lacto-Ovo) Sample 1-Day Menu  ?Breakfast 1 cup cooked oatmeal (2 carbohydra

## 2022-01-25 NOTE — Consult Note (Addendum)
[facility]                                                                                        Patient Identification: Patient Name: [name] MRN: [id number] Admit Date: 01/24/2022  1:47 PM Today's Date: 01/25/2022 Reason for consult:  Requesting provider:   Principal Problem:   Right foot cellulitis with abscess and early osteomyelitis in patient with uncontrolled diabetes Active Problems:   Neuropathy   Opioid use disorder, mild, in sustained remission, on maintenance therapy, abuse (HCC)   Uncontrolled IDDM-2 with hyperglycemia, neuropathy and diabetic foot ulcer infection   Pseudohyponatremia   Asthma   Sepsis due to diabetic right foot infection/osteomyelitis   Acute osteomyelitis of right foot (HCC)   Antibiotics:  Vancomycin 3/28-c Zosyn 3/28-c Cefepime 3/28-c Metronidazole 3/29-c  Lines/Hardware:  Assessment 31 Y O male with PMH of Asthma, HTN, Type 2 DM, Cigarette smoker, Opioid use d/o on suboxone with   # Rt foot Diabetic foot abscess/Osteomyelitis ( 5th metatarsal and 5th proximal phalanx) -S/p Rt foot I and D by Ortho 3/28 -3/28 wound cx ( 12:13)  strep agalactiae ( before abtx) -3/28 wound cx ( 16:41) strep agalactiae   -3/28 abscess cx GPC and GPR - MRSA PCR negative  - has good pulses DP  in the left foot. Rt foot is bandaged - ABI prelim with no abnormality   # Poorly controlled DM( a1c 11.2) # Smoking - counseled # Opioid use d/o - not a PICC candidate, HIV negative, HCV negative in 2021, on suboxone currently  # Leukocytosis 2/2 above  # Left Lateral Foot callus( plantar) - no signs of infection   Recommendations  -Will switch abtx to ceftriaxone 2 g IV daily and metronidazole 500 PO BID pending cultures  - Does not seem to be a PICC candidate given h/o opioid use  - Monitor blood cultures and wound/OR cultures/pathology  - Final abtx likely  PO vs oritavancin depening on cultures growth  - Monitor CBC and BMP - Needs BG control  - Following  Rest of the management as per the primary team. Please call with questions or concerns.  Thank you for the consult  Odette Fraction, MD Infectious Disease Physician [facility] [address] [place], [place] [address] Phone: [phone]  Fax: [phone]  __________________________________________________________________________________________________________ HPI and Hospital Course: 89 Y O male with PMH of Asthma, HTN, Type 2 DM, Cigarette smoker, Opioid use d/o on suboxone who presented to the ED on 3/28 with pain/swelling and redness in the rt foot for approx a week. He started the pain and swelling started over the callus in the rt lateral foot. He also has a callus in the left lateral foot without any symptoms currently. He follows IMTS for DM management. Smokes 1/2 pack of cigarettes a day, denies alcohol. He was taking meloxicam and prednisone thinking as if it was abtx.   At ED, T max 100.9WBC 16 Imagings as belows  S/p Rt foot I and D by Ortho 3/28  3/28 wound cx ( 12:13)  strep agalactiae ( before abtx)  3/28 wound cx ( 16:41) strep agalactiae   3/28 abscess cx GPC and GPR MRSA PCR negative   ROS: all systems reviewed and negative except as above   Past Medical History:  Diagnosis Date   Asthma    HTN (hypertension) 07/25/2017   Lisinopril 40mg  Daily   Hyperglycemia 09/24/2017   Type II diabetes mellitus (HCC)    "dx'd 06/2017"   Past Surgical History:  Procedure Laterality Date   ADENOIDECTOMY     LACERATION REPAIR Left    "stitched finger up"   TONSILLECTOMY       Scheduled Meds:  buprenorphine-naloxone  1 tablet Sublingual TID   docusate sodium  100 mg Oral BID   DULoxetine  60 mg Oral Daily   enoxaparin (LOVENOX) injection  40 mg Subcutaneous Q24H   gabapentin  100 mg Oral TID   insulin aspart   0-15 Units Subcutaneous TID WC   insulin aspart  0-5 Units Subcutaneous QHS   insulin aspart  7 Units Subcutaneous TID WC   insulin glargine-yfgn  23 Units Subcutaneous Daily   metroNIDAZOLE  500 mg Oral Q12H   nicotine  14 mg Transdermal Daily   Continuous Infusions:  ceFEPime (MAXIPIME) IV 2 g (01/25/22 0525)   vancomycin 1,500 mg (01/25/22 0632)   PRN Meds:.ibuprofen, ondansetron **OR** ondansetron (ZOFRAN) IV, traMADol  Allergies  Allergen Reactions   Vicodin [Hydrocodone-Acetaminophen] Anaphylaxis    Tolerates tylenol with no allergy   Eggs Or Egg-Derived Products Nausea And Vomiting   Lactose Intolerance (Gi) Rash   Social History   Socioeconomic History   Marital status: Single    Spouse name: Not on file   Number of children: Not on file   Years of education: Not on file   Highest education level: Not on file  Occupational History   Not on file  Tobacco Use   Smoking status: Every Day    Packs/day: 0.50    Years: 12.00    Pack years: 6.00    Types: Cigarettes   Smokeless tobacco: Never   Tobacco comments:    0.5 PPD  Vaping Use   Vaping Use: Never used  Substance and Sexual Activity   Alcohol use: Yes    Comment: OCCASSIONAL   Drug use: Yes    Types: Marijuana    Comment: 10/17/2017 ~3 times per week   Sexual activity: Never  Other Topics Concern   Not on file  Social History Narrative   Not on file   Social Determinants of Health   Financial Resource Strain: Not on file  Food Insecurity: Not on file  Transportation Needs: Not on file  Physical Activity: Not on file  Stress: Not on file  Social Connections: Not on file  Intimate Partner Violence: Not on file   Breast Cancer-relatedfamily history is not on file.  Vitals BP 134/75 (BP Location: Right Arm)   Pulse (!) 104   Temp 98.8 F (37.1 C) (Oral)   Resp 18   Ht 5\' 6"  (1.676 m)   Wt 78.4 kg   SpO2 98%   BMI 27.90 kg/m    Physical Exam Constitutional:  sitting up in bed and  appears comfortable    Comments:   Cardiovascular:     Rate and Rhythm: Normal rate and regular rhythm.     Heart sounds:   Pulmonary:     Effort: Pulmonary effort is normal on room air     Comments: bilateral clear breath sounds   Abdominal:  Palpations: Abdomen is soft.     Tenderness: non tender and non distended   Musculoskeletal:        General: Rt foot is bandaged and left lateral foot has a callus             Skin:    Comments:   Neurological:     General: No focal deficit present. Awake, alert and oriented * 3   Psychiatric:        Mood and Affect: Mood normal.    Pertinent Microbiology Results for orders placed or performed during the hospital encounter of 01/24/22  Blood culture (routine x 2)     Status: None (Preliminary result)   Collection Time: 01/24/22  2:20 PM   Specimen: BLOOD  Result Value Ref Range Status   Specimen Description   Final    BLOOD RIGHT ANTECUBITAL Performed at [facility], [address] [place], [place] [address]    Special Requests   Final    BOTTLES DRAWN AEROBIC AND ANAEROBIC Blood Culture adequate volume Performed at [facility], [address], [place], [place] [address]    Culture   Final    NO GROWTH < 24 HOURS Performed at [facility], [address], [place], [place] [address]    Report Status PENDING  Incomplete  Blood culture (routine x 2)     Status: None (Preliminary result)   Collection Time: 01/24/22  4:10 PM   Specimen: BLOOD  Result Value Ref Range Status   Specimen Description   Final    BLOOD BLOOD LEFT FOREARM Performed at [facility], [address] [place], [place] [address]    Special Requests   Final    BOTTLES DRAWN AEROBIC AND ANAEROBIC Blood Culture adequate volume Performed at [facility], [address] [place], [place] [address]    Culture   Final    NO GROWTH < 24 HOURS Performed at [facility], [address] [place], [place] [address]    Report Status PENDING  Incomplete  Aerobic Culture w Gram Stain (superficial specimen)     Status: None (Preliminary result)   Collection Time: 01/24/22  4:41 PM   Specimen: Wound  Result Value Ref Range Status   Specimen Description   Final    WOUND Performed at [facility] [address] [place], [place] [address]    Special Requests   Final    NONE RIGHT FOOT Performed at [facility], [address], [place], [place] [address]    Gram Stain PENDING  Incomplete   Culture   Final    FEW GROUP B STREP(S.AGALACTIAE)ISOLATED TESTING AGAINST S. AGALACTIAE NOT ROUTINELY PERFORMED DUE TO PREDICTABILITY OF AMP/PEN/VAN SUSCEPTIBILITY. Performed at [facility], [address], [place], [place] [address]    Report Status PENDING  Incomplete  Aerobic Culture w Gram Stain (superficial specimen)     Status: None (Preliminary result)   Collection Time: 01/24/22  9:09 PM   Specimen: Abscess  Result Value Ref Range Status   Specimen Description ABSCESS  Final   Special Requests NONE  Final   Gram Stain   Final    NO SQUAMOUS EPITHELIAL CELLS SEEN FEW WBC SEEN MODERATE GRAM POSITIVE COCCI    Culture   Final    TOO YOUNG TO READ Performed at [facility], [address], [place], [place] [address]    Report  Status PENDING  Incomplete  Aerobic Culture w Gram Stain (superficial specimen)     Status: None (Preliminary result)   Collection Time: 01/24/22  9:10 PM   Specimen: Abscess  Result Value Ref Range Status   Specimen Description ABSCESS  Final   Special Requests NONE  Final   Gram Stain   Final    NO SQUAMOUS EPITHELIAL CELLS SEEN FEW WBC SEEN FEW GRAM POSITIVE COCCI    Culture   Final    TOO YOUNG TO READ Performed at [facility], [address], [place], [place] [address]    Report Status PENDING  Incomplete  Aerobic/Anaerobic Culture w Gram Stain  (surgical/deep wound)     Status: None (Preliminary result)   Collection Time: 01/24/22  9:11 PM   Specimen: PATH Other; Tissue  Result Value Ref Range Status   Specimen Description   Final    TISSUE Performed at [facility], [address], [place], [place] [address]    Special Requests   Final    ABCESS Performed at [facility], [address] [place], [place] [address]    Gram Stain   Final    NO SQUAMOUS EPITHELIAL CELLS SEEN MODERATE WBC SEEN FEW GRAM POSITIVE RODS MODERATE GRAM POSITIVE COCCI Performed at [facility], [address] [place], [place] [address]    Culture PENDING  Incomplete   Report Status PENDING  Incomplete    Pertinent Lab seen by me:    Latest Ref Rng & Units 01/25/2022    3:05 AM 01/24/2022    2:11 PM 01/21/2022   12:22 AM  CBC  WBC 4.0 - 10.5 K/uL 16.3   16.6   10.8    Hemoglobin 13.0 - 17.0 g/dL 65.7   84.6   96.2    Hematocrit 39.0 - 52.0 % 40.7   43.9   43.5    Platelets 150 - 400 K/uL 261   254   249        Latest Ref Rng & Units 01/25/2022    3:05 AM 01/24/2022    2:11 PM 01/21/2022   12:22 AM  CMP  Glucose 70 - 99 mg/dL 952   841   324    BUN 6 - 20 mg/dL 12   7   5     Creatinine 0.61 - 1.24 mg/dL 4.01   0.27   2.53    Sodium 135 - 145 mmol/L 133   134   134    Potassium 3.5 - 5.1 mmol/L 3.8   4.2   4.5    Chloride 98 - 111 mmol/L 100   96   99    CO2 22 - 32 mmol/L 26   26   30     Calcium 8.9 - 10.3 mg/dL 8.3   9.0   9.0    Total Protein 6.5 - 8.1 g/dL  8.1     Total Bilirubin 0.3 - 1.2 mg/dL  1.6     Alkaline Phos 38 - 126 U/L  106     AST 15 - 41 U/L  13     ALT 0 - 44 U/L  14        Pertinent Imagings/Other Imagings Plain films and CT images have been personally visualized and interpreted; radiology reports have been reviewed. Decision making incorporated into the Impression / Recommendations.  MR FOOT RIGHT W WO CONTRAST  Result Date: 01/24/2022 CLINICAL DATA:   Osteomyelitis, foot EXAM:  MRI OF THE RIGHT FOREFOOT WITHOUT AND WITH CONTRAST TECHNIQUE: Multiplanar, multisequence MR imaging of the right forefoot was performed before and after the administration of intravenous contrast. CONTRAST:  8mL GADAVIST GADOBUTROL 1 MMOL/ML IV SOLN COMPARISON:  Right foot radiograph 01/24/2022 FINDINGS: Bones/Joint/Cartilage There is mild marrow edema and enhancement within the fifth metatarsal head and base of the fifth digit proximal phalanx. Preserved T1 marrow signal. Trace fifth MTP joint effusion. Ligaments Intact Lisfranc ligament.  Intact collateral ligaments. Muscles and Tendons No acute tendon tear in the forefoot. Diffuse intramuscular edema foot without significant atrophy. This commonly seen in diabetics. Soft tissues There is diffuse soft tissue swelling of the foot, most prominent dorsally and laterally. There is a small blister like skin lesion along the lateral forefoot (axial T2 image 21). There is soft tissue gas along the dorsal lateral forefoot at the level of the distal fifth metatarsal/fifth MTP joint. IMPRESSION: Soft tissue gas along the dorsal lateral forefoot at the level of the distal fifth metatarsal/MTP joint suspicious for soft tissue infection, possibly necrotizing, with adjacent blister-like skin lesion. Underlying marrow edema and enhancement in the fifth metatarsal head and base of the fifth proximal phalanx likely represents early osteomyelitis. Trace fifth MTP joint effusion. Electronically Signed   By: Caprice Renshaw M.D.   On: 01/24/2022 19:45   DG Foot Complete Right  Result Date: 01/24/2022 CLINICAL DATA:  Concern for osteomyelitis. EXAM: RIGHT FOOT COMPLETE - 3+ VIEW COMPARISON:  None. FINDINGS: No cortical erosion or periosteal reaction. No evidence of fracture or dislocation. Multiple pockets of gas and prominent subcutaneous soft tissue swelling about the head of the fifth metatarsal, representing infectious/inflammatory process. IMPRESSION:  1. Soft tissue swelling and multiple pockets of gas about the head of the fifth metatarsal consistent with infectious/inflammatory process. 2. No cortical erosion or periosteal reaction, to suggest osteomyelitis, evaluation of early osteomyelitis is however limited on radiographs. MRI examination is recommended for further evaluation. Electronically Signed   By: Larose Hires D.O.   On: 01/24/2022 14:51   DG Foot Complete Right  Result Date: 01/21/2022 CLINICAL DATA:  Right foot pain and swelling. EXAM: RIGHT FOOT COMPLETE - 3+ VIEW COMPARISON:  None. FINDINGS: There is no evidence of fracture or dislocation. There is no evidence of arthropathy or other focal bone abnormality. Soft tissues are unremarkable. IMPRESSION: Negative. Electronically Signed   By: Elgie Collard M.D.   On: 01/21/2022 01:23     I spent more than 80 minutes for this patient encounter including review of prior medical records/discussing diagnostics and treatment plan with the patient/family/coordinate care with primary/other specialits with greater than 50% of time in face to face encounter.   Electronically signed by:   Odette Fraction, MD Infectious Disease Physician [facility] for Infectious Disease Pager: [phone]

## 2022-01-25 NOTE — Progress Notes (Signed)
Initial Nutrition Assessment ? ?DOCUMENTATION CODES:  ? ?Not applicable ? ?INTERVENTION:  ?- will order Ensure Max BID, each supplement provides 150 kcal and 30 grams of protein. ?- will order 1 tablet multivitamin with minerals daily ?- place Carbohydrate Counting for People with Diabetes handout in AVS. ? ? ?NUTRITION DIAGNOSIS:  ? ?Increased nutrient needs related to acute illness, wound healing as evidenced by estimated needs. ? ?GOAL:  ? ?Patient will meet greater than or equal to 90% of their needs ? ?MONITOR:  ? ?PO intake, Supplement acceptance, Labs, Weight trends, Skin ? ?REASON FOR ASSESSMENT:  ? ?Consult ?Wound healing ? ?ASSESSMENT:  ? ?31 year old male with medical history of insulin-dependent type 2 DM, HTN, opioid use disorder on Suboxone, and asthma. He presented to the ED with R foot infection which began 4 days PTA. He was admitted for R foot cellulitis with abscess and early osteomyelitis to R 5th toe. He underwent R foot I&D of abscess on 3/28. ? ?Patient laying in bed with no visitors present at the time of RD visit. He is POD #1 I&D and reports some tenderness to incision site but not overt pain. He had recently finished lunch.  ? ?Patient reports that he checks CBGs and takes insulin as prescribed at home and recognizes the need to make this a priority. He shares that CBGs used to run in the 500-600s but more recently have been around 270-280 mg/dl. He has been more mindful about PO intakes. He does not typically read nutrition labels but does feel that starting this practice would be beneficial for him. ? ?He is very motivated to better control diabetes and to remain out of the hospital. Talked about reading nutrition labels, eating 75 grams of carbohydrate or less/meal, and always having a protein source any time he eats. Informed patient of plan to add handout to AVS. ? ?Weight yesterday was 173 lb and has been stable since 10/19/21. Weight on 08/16/21 was 177 lb. This indicates 4 lb  weight loss (2.25% body weight).  ? ? ?Labs reviewed; HgbA1c: 11.2%, CBGs: 282, 450, 207 mg/dl, Na: 133 mmol/l, creatinine: 0.53 mg/dl, Ca: 8.3 mg/dl.  ? ?Medications reviewed; 100 mg colace BID, sliding scale novolog, 6 units novolog x1 dose 3/28 and x1 dose 3/29, 7 units novolog TID starting 3/29 AM, 23 units semglee/day. ?  ? ?NUTRITION - FOCUSED PHYSICAL EXAM: ? ?Deferred. ? ?Diet Order:   ?Diet Order   ? ?       ?  Diet Carb Modified Fluid consistency: Thin; Room service appropriate? Yes  Diet effective now       ?  ? ?  ?  ? ?  ? ? ?EDUCATION NEEDS:  ? ?Education needs have been addressed ? ?Skin:  Skin Assessment: Skin Integrity Issues: ?Skin Integrity Issues:: Incisions ?Incisions: R foot (3/28) ? ?Last BM:  PTA/unknown ? ?Height:  ? ?Ht Readings from Last 1 Encounters:  ?01/24/22 5\' 6"  (1.676 m)  ? ? ?Weight:  ? ?Wt Readings from Last 1 Encounters:  ?01/24/22 78.4 kg  ? ? ? ?BMI:  Body mass index is 27.9 kg/m?. ? ?Estimated Nutritional Needs:  ?Kcal:  2000-2250 kcal ?Protein:  105-120 grams ?Fluid:  >/= 2 L/day ? ? ? ? ?Jarome Matin, MS, RD, LDN ?Registered Dietitian II ?Inpatient Clinical Nutrition ?RD pager # and on-call/weekend pager # available in [place]  ? ?

## 2022-01-25 NOTE — Progress Notes (Addendum)
Inpatient Diabetes Program Recommendations ? ?AACE/ADA: New Consensus Statement on Inpatient Glycemic Control (2015) ? ?Target Ranges:  Prepandial:   less than 140 mg/dL ?     Peak postprandial:   less than 180 mg/dL (1-2 hours) ?     Critically ill patients:  140 - 180 mg/dL  ? ?Lab Results  ?Component Value Date  ? GLUCAP 207 (H) 01/25/2022  ? HGBA1C 11.2 (H) 01/24/2022  ? ? ?Review of Glycemic Control ? ?Diabetes history: DM 2 ?Outpatient Diabetes medications: Lantus 20 units, Novolog 8 units tid ?Current orders for Inpatient glycemic control:  ?Semglee 23 units ?Novolog 0-15 units tid + hs ?Novolog 7 units tid meal coverage ? ?Inpatient Diabetes Program Recommendations:   ? ?Spoke with pt at bedside regarding A1c of 11.2% and glucose control at home. Pt reports that his glucose trends have been higher lately. Per pt report he has been very frequently to see his doctor without much improvement in glucose and wound. Pt reports working third shift and checking glucose bid. Pt is able to get all insulins for $4 and has been seeing an MD at the internal medicine clinic at [facility], however he feels he needs to change to a different clinic. Pt also reports their financial counselor quit and he needs to proceed in filling out the application for an orange card. I placed TOC referral to inquire about our other clinic locations for pt. Encouraged follow up. ? ?Pt reports he was given orange juice this am before the glucose check resulting in glucose >400.  ? ?Thanks, ? ?Christena Deem RN, MSN, BC-ADM ?Inpatient Diabetes Coordinator ?Team Pager [phone] (8a-5p) ? ? ? ?

## 2022-01-25 NOTE — Assessment & Plan Note (Signed)
See above

## 2022-01-26 DIAGNOSIS — D72825 Bandemia: Secondary | ICD-10-CM

## 2022-01-26 LAB — CBC
HCT: 35.1 % — ABNORMAL LOW (ref 39.0–52.0)
Hemoglobin: 12 g/dL — ABNORMAL LOW (ref 13.0–17.0)
MCH: 29.2 pg (ref 26.0–34.0)
MCHC: 34.2 g/dL (ref 30.0–36.0)
MCV: 85.4 fL (ref 80.0–100.0)
Platelets: 264 10*3/uL (ref 150–400)
RBC: 4.11 MIL/uL — ABNORMAL LOW (ref 4.22–5.81)
RDW: 11.4 % — ABNORMAL LOW (ref 11.5–15.5)
WBC: 11.7 10*3/uL — ABNORMAL HIGH (ref 4.0–10.5)
nRBC: 0 % (ref 0.0–0.2)

## 2022-01-26 LAB — RENAL FUNCTION PANEL
Albumin: 2.7 g/dL — ABNORMAL LOW (ref 3.5–5.0)
Anion gap: 6 (ref 5–15)
BUN: 10 mg/dL (ref 6–20)
CO2: 30 mmol/L (ref 22–32)
Calcium: 8.2 mg/dL — ABNORMAL LOW (ref 8.9–10.3)
Chloride: 98 mmol/L (ref 98–111)
Creatinine, Ser: 0.39 mg/dL — ABNORMAL LOW (ref 0.61–1.24)
GFR, Estimated: >60 mL/min (ref >60)
Glucose, Bld: 237 mg/dL — ABNORMAL HIGH (ref 70–99)
Phosphorus: 2.9 mg/dL (ref 2.5–4.6)
Potassium: 3.7 mmol/L (ref 3.5–5.1)
Sodium: 134 mmol/L — ABNORMAL LOW (ref 135–145)

## 2022-01-26 LAB — GLUCOSE, CAPILLARY
Glucose-Capillary: 275 mg/dL — ABNORMAL HIGH (ref 70–99)
Glucose-Capillary: 182 mg/dL — ABNORMAL HIGH (ref 70–99)
Glucose-Capillary: 61 mg/dL — ABNORMAL LOW (ref 70–99)
Glucose-Capillary: 169 mg/dL — ABNORMAL HIGH (ref 70–99)
Glucose-Capillary: 65 mg/dL — ABNORMAL LOW (ref 70–99)
Glucose-Capillary: 239 mg/dL — ABNORMAL HIGH (ref 70–99)
Glucose-Capillary: 271 mg/dL — ABNORMAL HIGH (ref 70–99)

## 2022-01-26 LAB — MAGNESIUM: Magnesium: 1.9 mg/dL (ref 1.7–2.4)

## 2022-01-26 LAB — SURGICAL PATHOLOGY

## 2022-01-26 LAB — LIPID PANEL
Cholesterol: 108 mg/dL (ref 0–200)
HDL: 28 mg/dL — ABNORMAL LOW (ref >40)
LDL Cholesterol: 66 mg/dL (ref 0–99)
Total CHOL/HDL Ratio: 3.9 RATIO
Triglycerides: 70 mg/dL (ref <150)
VLDL: 14 mg/dL (ref 0–40)

## 2022-01-26 LAB — AEROBIC CULTURE W GRAM STAIN (SUPERFICIAL SPECIMEN)

## 2022-01-26 NOTE — Anesthesia Postprocedure Evaluation (Signed)
Anesthesia Post Note ? ?Patient: [name] ? ?Procedure(s) Performed: INCISION AND DRAINAGE ABSCESS (Right: Foot) ? ?  ? ?Patient location during evaluation: PACU ?Anesthesia Type: General ?Level of consciousness: awake and alert ?Pain management: pain level controlled ?Vital Signs Assessment: post-procedure vital signs reviewed and stable ?Respiratory status: spontaneous breathing, nonlabored ventilation, respiratory function stable and patient connected to nasal cannula oxygen ?Cardiovascular status: blood pressure returned to baseline and stable ?Postop Assessment: no apparent nausea or vomiting ?Anesthetic complications: no ? ? ?No notable events documented. ? ?Last Vitals:  ?Vitals:  ? 01/25/22 2122 01/26/22 0609  ?BP: (!) 133/99 130/87  ?Pulse: 94 87  ?Resp: 17 17  ?Temp: 36.8 ?C 37.1 ?C  ?SpO2: 100% 100%  ?  ?Last Pain:  ?Vitals:  ? 01/26/22 0609  ?TempSrc: Oral  ?PainSc: 0-No pain  ? ? ?  ?  ?  ?  ?  ?  ? ?Brooklen Runquist S ? ? ? ? ?

## 2022-01-26 NOTE — Progress Notes (Signed)
Inpatient Diabetes Program Recommendations ? ?AACE/ADA: New Consensus Statement on Inpatient Glycemic Control (2015) ? ?Target Ranges:  Prepandial:   less than 140 mg/dL ?     Peak postprandial:   less than 180 mg/dL (1-2 hours) ?     Critically ill patients:  140 - 180 mg/dL  ? ?Lab Results  ?Component Value Date  ? GLUCAP 275 (H) 01/26/2022  ? HGBA1C 11.2 (H) 01/24/2022  ? ? ?Review of Glycemic Control ? Latest Reference Range & Units 01/25/22 07:27 01/25/22 11:35 01/25/22 16:43 01/25/22 21:24 01/26/22 08:02  ?Glucose-Capillary 70 - 99 mg/dL 450 (H) 207 (H) 159 (H) 103 (H) 275 (H)  ? ?Diabetes history: DM 2 ?Outpatient Diabetes medications: Lantus 20 units, Novolog 8 units tid ?Current orders for Inpatient glycemic control:  ?Semglee 23 units ?Novolog 0-15 units tid + hs ?Novolog 7 units tid meal coverage ? ?Inpatient Diabetes Program Recommendations:   ? ?Note: Hyperglycemia yesterday am before basal insulin and pt also had orange juice prior to glucose check. ? ?-  Consider increasing Semglee to 25 units ?-  Decrease Novolog meal coverage to 6 units tid ? ?Thanks, ? ?Tama Headings RN, MSN, BC-ADM ?Inpatient Diabetes Coordinator ?Team Pager [phone] (8a-5p) ? ? ? ?

## 2022-01-26 NOTE — Assessment & Plan Note (Addendum)
Resolving

## 2022-01-26 NOTE — Progress Notes (Signed)
?PROGRESS NOTE ? ?[name] QGB:[id number] DOB: 1990-12-19  ? ?PCP: Gaylan Gerold, DO ? ?Patient is from: Home. ? ?DOA: 01/24/2022 LOS: 2 ? ?Chief complaints ?Chief Complaint  ?Patient presents with  ? Wound Infection  ?  ? ?Brief Narrative / Interim history: ?31 year old M with PMH of IDDM-2, HTN, opioid use disorder on Suboxone, and asthma presenting with right foot infection that has started 4 days prior to presentation, and admitted for right foot cellulitis with abscess and early osteomyelitis and right fifth metatarsal and fifth proximal phalanx.  He was febrile to 100.9.  Slightly tachycardic.  Had leukocytosis to 16.6 with left shift.  Lactic acid negative.  CRP elevated to 20.  ESR 47.  Blood cultures obtained.  Started on broad-spectrum antibiotics.  Orthopedic surgery consulted.  Underwent right foot I&D of an abscess on 3/28. ? ?Superficial wound culture Streptococcus agalactia.  Deep abscess culture with few GPR and moderate GPC's.  Infectious disease consulted.  De-escalated antibiotics to IV ceftriaxone and p.o. Flagyl.  ? ?Subjective: ?Seen and examined earlier this morning.  No major events overnight of this morning.  No complaints.  Pain fairly controlled.  Patient's mother available over the phone and was given opportunity to ask questions.  Patient is eager to go home but understands the need to stay in the hospital until he is cleared medically. ? ?Objective: ?Vitals:  ? 01/25/22 0039 01/25/22 0434 01/25/22 2122 01/26/22 9758  ?BP: 136/78 134/75 (!) 133/99 130/87  ?Pulse: 100 (!) 104 94 87  ?Resp: '18 18 17 17  ' ?Temp: 98.8 ?F (37.1 ?C) 98.8 ?F (37.1 ?C) 98.3 ?F (36.8 ?C) 98.7 ?F (37.1 ?C)  ?TempSrc:  Oral Oral Oral  ?SpO2: 100% 98% 100% 100%  ?Weight:      ?Height:      ? ? ?Examination: ? ?GENERAL: No apparent distress.  Nontoxic. ?HEENT: MMM.  Vision and hearing grossly intact.  ?NECK: Supple.  No apparent JVD.  ?RESP:  No IWOB.  Fair aeration bilaterally. ?CVS:  RRR. Heart sounds normal.   ?ABD/GI/GU: BS+. Abd soft, NTND.  ?MSK/EXT:  Moves extremities.  Dressing over right foot DCI.  Intact sensation in his toes. ?SKIN: As above. ?NEURO: Awake and alert. Oriented appropriately.  No apparent focal neuro deficit. ?PSYCH: Calm. Normal affect.  ? ?Procedures:  ?3/28-I&D of right foot infection ? ?Microbiology summarized: ?3/28-blood cultures NGTD. ?3/28-superficial wound culture with Streptococcus agalactia/GPC's ?3/28-deep abscess culture with few GPR and moderate GPC's ? ?Assessment and Plan: ?* Right foot cellulitis with abscess and early osteomyelitis in patient with uncontrolled diabetes ?MRI concerning for right foot cellulitis with possible necrotizing infection and early osteomyelitis of right fifth metatarsal and fifth distal phalanx.  CRP 20.  ESR 47.  Blood cultures NGTD.  Superficial wound culture with Streptococcus agalactiae.  Deep abscess culture with few GPR and moderate GPC's.  ABI normal. ?-S/p I&D by Dr. Ashby Dawes on 3/28.  ?-Wound care per orthopedic surgery-recommended daily dressing change ?-Weightbearing on right heel per orthopedic surgery ?-ID consulted and de-escalated antibiotics: 3/28 vanc and Zosyn> 3/29 CTX and Flagyl>>> ?-Might not be a candidate for home IV infusion given opiate dependence ?-Follow further culture speciation and sensitivity ?-On Suboxone and tramadol for pain control.  ?-On Lovenox for VTE prophylaxis ? ?Sepsis due to diabetic right foot infection/osteomyelitis ?See above ? ?Uncontrolled IDDM-2 with hyperglycemia, neuropathy and diabetic foot ulcer infection ?A1c 11.2%.  Suspect noncompliance.  ?Recent Labs  ?Lab 01/25/22 ?0727 01/25/22 ?1135 01/25/22 ?1643 01/25/22 ?2124 01/26/22 ?0802  ?GLUCAP  450* 207* 159* 103* 275*  ?-Continue SSI-moderate ?-Continue NovoLog 7 units 3 times daily with meals ?-Continue basal insulin at 23 units daily ?-Monitor CBG and adjust insulin as appropriate ?-Appreciate input by diabetic  coordinator. ? ? ?Pseudohyponatremia ?Corrects to normal for hyperglycemia. ? ?Opioid use disorder, mild, in sustained remission, on maintenance therapy, abuse ([place]) ?Patient reports taking Suboxone 3 times a day.  Confirmed a narcotic database. ?-Continue home Suboxone to 3 times a day ? ?Neuropathy ?Continue home gabapentin, Cymbalta and Suboxone ?Continue tramadol ? ?Bandemia ?Likely due to infection.  Improving. ? ?Asthma ?No acute exacerbation.  ?PRN albuterol.  ? ? ? ?  ?DVT prophylaxis:  ?enoxaparin (LOVENOX) injection 40 mg Start: 01/25/22 1200 ?SCDs Start: 01/24/22 2251 ? ?Code Status: Full code ?Family Communication: Updated patient's mother over the phone from bedside. ?Level of care: Med-Surg ?Status is: Inpatient ?Remains inpatient appropriate because: Sepsis from diabetic right foot infection/osteomyelitis requiring IV antibiotics ? ? ?Final disposition: TBD ? ?Consultants:  ?Orthopedic surgery ?Infectious disease ? ?Sch Meds:  ?Scheduled Meds: ? buprenorphine-naloxone  1 tablet Sublingual TID  ? docusate sodium  100 mg Oral BID  ? DULoxetine  60 mg Oral Daily  ? enoxaparin (LOVENOX) injection  40 mg Subcutaneous Q24H  ? gabapentin  100 mg Oral TID  ? insulin aspart  0-15 Units Subcutaneous TID WC  ? insulin aspart  0-5 Units Subcutaneous QHS  ? insulin aspart  7 Units Subcutaneous TID WC  ? insulin glargine-yfgn  23 Units Subcutaneous Daily  ? metroNIDAZOLE  500 mg Oral Q12H  ? multivitamin with minerals  1 tablet Oral Daily  ? nicotine  14 mg Transdermal Daily  ? Ensure Max Protein  11 oz Oral BID  ? ?Continuous Infusions: ? cefTRIAXone (ROCEPHIN)  IV 2 g (01/25/22 1433)  ? ?PRN Meds:.ibuprofen, ondansetron **OR** ondansetron (ZOFRAN) IV, traMADol ? ?Antimicrobials: ?Anti-infectives (From admission, onward)  ? ? Start     Dose/Rate Route Frequency Ordered Stop  ? 01/25/22 1400  cefTRIAXone (ROCEPHIN) 2 g in sodium chloride 0.9 % 100 mL IVPB       ? 2 g ?200 mL/hr over 30 Minutes Intravenous Every 24  hours 01/25/22 1208    ? 01/25/22 0600  vancomycin (VANCOREADY) IVPB 1500 mg/300 mL  Status:  Discontinued       ? 1,500 mg ?150 mL/hr over 120 Minutes Intravenous Every 12 hours 01/24/22 1750 01/25/22 1208  ? 01/24/22 2200  metroNIDAZOLE (FLAGYL) tablet 500 mg       ? 500 mg Oral Every 12 hours 01/24/22 1721 01/31/22 2159  ? 01/24/22 2200  ceFEPIme (MAXIPIME) 2 g in sodium chloride 0.9 % 100 mL IVPB  Status:  Discontinued       ? 2 g ?200 mL/hr over 30 Minutes Intravenous Every 8 hours 01/24/22 1750 01/25/22 1208  ? 01/24/22 2138  vancomycin (VANCOCIN) powder  Status:  Discontinued       ?   As needed 01/24/22 2139 01/24/22 2239  ? 01/24/22 2030  ceFAZolin (ANCEF) IVPB 2g/100 mL premix       ? 2 g ?200 mL/hr over 30 Minutes Intravenous  Once 01/24/22 2028 01/24/22 2050  ? 01/24/22 2028  ceFAZolin (ANCEF) 2-4 GM/100ML-% IVPB       ?Note to Pharmacy: Cynda Familia: cabinet override  ?    01/24/22 2028 01/24/22 2101  ? 01/24/22 1545  piperacillin-tazobactam (ZOSYN) IVPB 3.375 g       ? 3.375 g ?100 mL/hr over 30 Minutes  Intravenous  Once 01/24/22 1540 01/24/22 1611  ? 01/24/22 1545  vancomycin (VANCOREADY) IVPB 2000 mg/400 mL       ? 2,000 mg ?200 mL/hr over 120 Minutes Intravenous  Once 01/24/22 1542 01/24/22 1814  ? ?  ? ? ? ?I have personally reviewed the following labs and images: ?CBC: ?Recent Labs  ?Lab 01/21/22 ?0022 01/24/22 ?1411 01/25/22 ?0305 01/26/22 ?0327  ?WBC 10.8* 16.6* 16.3* 11.7*  ?NEUTROABS 6.7 14.0*  --   --   ?HGB 15.1 15.0 14.2 12.0*  ?HCT 43.5 43.9 40.7 35.1*  ?MCV 85.3 85.9 84.1 85.4  ?PLT 249 254 261 264  ? ?BMP &GFR ?Recent Labs  ?Lab 01/21/22 ?0022 01/24/22 ?1411 01/25/22 ?0305 01/26/22 ?0327  ?NA 134* 134* 133* 134*  ?K 4.5 4.2 3.8 3.7  ?CL 99 96* 100 98  ?CO2 '30 26 26 30  ' ?GLUCOSE 336* 239* 296* 237*  ?BUN 5* '7 12 10  ' ?CREATININE 0.58* 0.54* 0.53* 0.39*  ?CALCIUM 9.0 9.0 8.3* 8.2*  ?MG  --   --   --  1.9  ?PHOS  --   --   --  2.9  ? ?Estimated Creatinine Clearance: 134.1 mL/min (A)  (by C-G formula based on SCr of 0.39 mg/dL (L)). ?Liver & Pancreas: ?Recent Labs  ?Lab 01/24/22 ?1411 01/26/22 ?0327  ?AST 13*  --   ?ALT 14  --   ?ALKPHOS 106  --   ?BILITOT 1.6*  --   ?PROT 8.1  --   ?ALBUMIN 3.8 2.7*  ? ?No results for i

## 2022-01-26 NOTE — Plan of Care (Signed)
  Problem: Clinical Measurements: Goal: Ability to maintain clinical measurements within normal limits will improve Outcome: Progressing   Problem: Pain Managment: Goal: General experience of comfort will improve Outcome: Progressing   Problem: Safety: Goal: Ability to remain free from injury will improve Outcome: Progressing   

## 2022-01-26 NOTE — Progress Notes (Addendum)
? ?[facility] Infectious Diseases Follow Up Note ? ?Patient Identification: ?Patient Name: Roger HartshornGregory Farley MRN: [id number] Admit Date: 01/24/2022  1:47 PM ?Age: 31 y.o.Today's Date: 01/26/2022 ? ? ?Reason for Visit: osteomyelitis  ? ?Principal Problem: ?  Right foot cellulitis with abscess and early osteomyelitis in patient with uncontrolled diabetes ?Active Problems: ?  Neuropathy ?  Opioid use disorder, mild, in sustained remission, on maintenance therapy, abuse (HCC) ?  Uncontrolled IDDM-2 with hyperglycemia, neuropathy and diabetic foot ulcer infection ?  Pseudohyponatremia ?  Asthma ?  Sepsis due to diabetic right foot infection/osteomyelitis ?  Acute osteomyelitis of right foot (HCC) ?  Bandemia ? ?Antibiotics:  ?Vancomycin 3/28-c ?Zosyn 3/28-c ?Cefepime 3/28-c ?Metronidazole 3/29-c ?  ?Lines/Hardware: ?  ?Interval Events: Afebrile, leukocytosis downtrending ? ? ?Assessment ?# Rt foot Diabetic foot abscess/Osteomyelitis ( 5th metatarsal and 5th proximal phalanx) ?-S/p Rt foot I and D by Ortho 3/28 ?-3/28 wound cx ( 12:13)  strep agalactiae ( before abtx) ?-3/28 wound cx ( 16:41) strep agalactiae  ?  ?-3/28 abscess cx GPC and GPR, strep agalactiae ?- MRSA PCR negative  ?- has good pulses DP  in the left foot. Rt foot is bandaged ?- ABI with no abnormality  ?  ?# Poorly controlled DM( a1c 11.2) with hyperglycemia  ?# Smoking - counseled ?# Opioid use d/o - not a PICC candidate, HIV negative, HCV negative in 2021, on suboxone currently  ?# Leukocytosis - downtrending  ? ?Recommendations ?-Continue IV ceftriaxone and p.o. metronidazole as is ?-Follow-up final cultures ?- Plan for p.o. antibiotics ( augmentin) versus long-acting during discharge given history of opioid use ?- Following ? ?Rest of the management as per the primary team. ?Thank you for the consult. Please page with pertinent questions or  concerns. ? ?______________________________________________________________________ ?Subjective ?patient seen and examined at the bedside.  ?Sister at bedside ?Denies any complaints ? ?Vitals ?BP 130/87 (BP Location: Right Arm)   Pulse 87   Temp 98.7 ?F (37.1 ?C) (Oral)   Resp 17   Ht 5\' 6"  (1.676 m)   Wt 78.4 kg   SpO2 100%   BMI 27.90 kg/m?  ? ?  ?Physical Exam ?Constitutional: Lying in the bed and appears comfortable ?   Comments:  ? ?Cardiovascular:  ?   Rate and Rhythm: Normal rate and regular rhythm.  ?   Heart sounds:  ? ?Pulmonary:  ?   Effort: Pulmonary effort is normal on room air ?   Comments:  ? ?Abdominal:  ?   Palpations: Abdomen is soft.  ?   Tenderness: Nondistended ? ?Musculoskeletal:     ?   General: No swelling or tenderness.  Right foot is bandaged ? ?Skin: ?   Comments:  ? ?Neurological:  ?   General: Grossly nonfocal, awake alert and oriented ? ?Psychiatric:     ?   Mood and Affect: Mood normal.  ? ?Pertinent Microbiology ?Results for orders placed or performed during the hospital encounter of 01/24/22  ?Blood culture (routine x 2)     Status: None (Preliminary result)  ? Collection Time: 01/24/22  2:20 PM  ? Specimen: BLOOD  ?Result Value Ref Range Status  ? Specimen Description   Final  ?  BLOOD RIGHT ANTECUBITAL ?Performed at [facility], [address] [place], [place] [address] ?  ? Special Requests   Final  ?  BOTTLES DRAWN AEROBIC AND ANAEROBIC Blood Culture adequate volume ?Performed at [facility], [address] [place], [place] [address] ?  ?  Culture   Final  ?  NO GROWTH 2 DAYS ?Performed at [facility], [address], [place], [place] [address] ?  ? Report Status PENDING  Incomplete  ?Blood culture (routine x 2)     Status: None (Preliminary result)  ? Collection Time: 01/24/22  4:10 PM  ? Specimen: BLOOD  ?Result Value Ref Range Status  ? Specimen Description   Final  ?  BLOOD BLOOD LEFT FOREARM ?Performed at [facility], [address], [place], [place] [address] ?  ? Special Requests   Final  ?  BOTTLES DRAWN AEROBIC AND ANAEROBIC Blood Culture adequate volume ?Performed at [facility], [address] [place], [place] [address] ?  ? Culture   Final  ?  NO GROWTH 2 DAYS ?Performed at [facility], [address], [place], [place] [address] ?  ? Report Status PENDING  Incomplete  ?Aerobic Culture w Gram Stain (superficial specimen)     Status: None (Preliminary result)  ? Collection Time: 01/24/22  4:41 PM  ? Specimen: Wound  ?Result Value Ref Range Status  ? Specimen Description   Final  ?  WOUND ?Performed at [facility], [address], [place], [place] [address] ?  ? Special Requests   Final  ?  NONE RIGHT FOOT ?Performed at [facility], [address], [place], [place] [address] ?  ? Gram Stain PENDING  Incomplete  ? Culture   Final  ?  FEW GROUP B STREP(S.AGALACTIAE)ISOLATED ?TESTING AGAINST S. AGALACTIAE NOT ROUTINELY PERFORMED DUE TO PREDICTABILITY OF AMP/PEN/VAN SUSCEPTIBILITY. ?Performed at [facility], [address] [place], [place] [address] ?  ? Report Status PENDING  Incomplete  ?Aerobic Culture w Gram Stain (superficial specimen)     Status: None (Preliminary result)  ? Collection Time: 01/24/22  9:09 PM  ? Specimen: Abscess  ?Result Value Ref Range Status  ? Specimen Description ABSCESS  Final  ? Special Requests NONE  Final  ? Gram Stain   Final  ?  NO SQUAMOUS EPITHELIAL CELLS SEEN ?FEW WBC SEEN ?MODERATE GRAM POSITIVE COCCI ?  ? Culture   Final  ?  FEW STREPTOCOCCUS AGALACTIAE ?TESTING AGAINST S. AGALACTIAE NOT ROUTINELY PERFORMED DUE TO PREDICTABILITY OF AMP/PEN/VAN SUSCEPTIBILITY. ?Performed at [facility], [address], [place], [place] [address] ?  ? Report Status PENDING  Incomplete  ?Aerobic Culture w Gram Stain (superficial specimen)     Status: None (Preliminary result)  ? Collection Time: 01/24/22   9:10 PM  ? Specimen: Abscess  ?Result Value Ref Range Status  ? Specimen Description ABSCESS  Final  ? Special Requests NONE  Final  ? Gram Stain   Final  ?  NO SQUAMOUS EPITHELIAL CELLS SEEN ?FEW WBC SEEN ?FEW GRAM POSITIVE COCCI ?  ? Culture   Final  ?  FEW STREPTOCOCCUS AGALACTIAE ?TESTING AGAINST S. AGALACTIAE NOT ROUTINELY PERFORMED DUE TO PREDICTABILITY OF AMP/PEN/VAN SUSCEPTIBILITY. ?Performed at [facility], [address] [place], [place] [address] ?  ? Report Status PENDING  Incomplete  ?Aerobic/Anaerobic Culture w Gram Stain (surgical/deep wound)     Status: None (Preliminary result)  ? Collection Time: 01/24/22  9:11 PM  ? Specimen: PATH Other; Tissue  ?Result Value Ref Range Status  ? Specimen Description   Final  ?  TISSUE ?Performed at [facility], [address], [place], [place] [address] ?  ? Special Requests  Final  ?  ABCESS ?Performed at [facility], [address], [place], [place] [address] ?  ? Gram Stain   Final  ?  NO SQUAMOUS EPITHELIAL CELLS SEEN ?MODERATE WBC SEEN ?FEW GRAM POSITIVE RODS ?MODERATE GRAM POSITIVE COCCI ?Performed at [facility], [address] [place], [place] [address] ?  ? Culture PENDING  Incomplete  ? Report Status PENDING  Incomplete  ? ?Pertinent Lab. ? ?  Latest Ref Rng & Units 01/26/2022  ?  3:27 AM 01/25/2022  ?  3:05 AM 01/24/2022  ?  2:11 PM  ?CBC  ?WBC 4.0 - 10.5 K/uL 11.7   16.3   16.6    ?Hemoglobin 13.0 - 17.0 g/dL 73.2   20.2   54.2    ?Hematocrit 39.0 - 52.0 % 35.1   40.7   43.9    ?Platelets 150 - 400 K/uL 264   261   254    ? ? ?  Latest Ref Rng & Units 01/26/2022  ?  3:27 AM 01/25/2022  ?  3:05 AM 01/24/2022  ?  2:11 PM  ?CMP  ?Glucose 70 - 99 mg/dL 706   237   628    ?BUN 6 - 20 mg/dL 10   12   7     ?Creatinine 0.61 - 1.24 mg/dL   3.15   1.76    ?Sodium 135 - 145 mmol/L 134   133   134    ?Potassium 3.5 - 5.1 mmol/L 3.7   3.8   4.2    ?Chloride 98 - 111 mmol/L 98   100   96    ?CO2 22 - 32 mmol/L  30   26   26     ?Calcium 8.9 - 10.3 mg/dL 8.2   8.3   9.0    ?Total Protein 6.5 - 8.1 g/dL   8.1    ?Total Bilirubin 0.3 - 1.2 mg/dL   1.6    ?Alkaline Phos 38 - 126 U/L   106    ?AST 15 - 41 U/L   13    ?ALT 0 - 44 U/L   14    ? ? ? ?Pertinent Imaging today ?Plain films and

## 2022-01-27 ENCOUNTER — Other Ambulatory Visit (HOSPITAL_COMMUNITY): Payer: Self-pay

## 2022-01-27 LAB — CBC
HCT: 34.9 % — ABNORMAL LOW (ref 39.0–52.0)
Hemoglobin: 11.8 g/dL — ABNORMAL LOW (ref 13.0–17.0)
MCH: 29.2 pg (ref 26.0–34.0)
MCHC: 33.8 g/dL (ref 30.0–36.0)
MCV: 86.4 fL (ref 80.0–100.0)
Platelets: 328 10*3/uL (ref 150–400)
RBC: 4.04 MIL/uL — ABNORMAL LOW (ref 4.22–5.81)
RDW: 11.2 % — ABNORMAL LOW (ref 11.5–15.5)
WBC: 10.6 10*3/uL — ABNORMAL HIGH (ref 4.0–10.5)
nRBC: 0 % (ref 0.0–0.2)

## 2022-01-27 LAB — RENAL FUNCTION PANEL
Albumin: 2.7 g/dL — ABNORMAL LOW (ref 3.5–5.0)
Anion gap: 6 (ref 5–15)
BUN: 12 mg/dL (ref 6–20)
CO2: 30 mmol/L (ref 22–32)
Calcium: 8.4 mg/dL — ABNORMAL LOW (ref 8.9–10.3)
Chloride: 98 mmol/L (ref 98–111)
Creatinine, Ser: 0.38 mg/dL — ABNORMAL LOW (ref 0.61–1.24)
GFR, Estimated: >60 mL/min (ref >60)
Glucose, Bld: 249 mg/dL — ABNORMAL HIGH (ref 70–99)
Phosphorus: 3.2 mg/dL (ref 2.5–4.6)
Potassium: 4.1 mmol/L (ref 3.5–5.1)
Sodium: 134 mmol/L — ABNORMAL LOW (ref 135–145)

## 2022-01-27 LAB — GLUCOSE, CAPILLARY
Glucose-Capillary: 261 mg/dL — ABNORMAL HIGH (ref 70–99)
Glucose-Capillary: 308 mg/dL — ABNORMAL HIGH (ref 70–99)
Glucose-Capillary: 335 mg/dL — ABNORMAL HIGH (ref 70–99)
Glucose-Capillary: 284 mg/dL — ABNORMAL HIGH (ref 70–99)

## 2022-01-27 LAB — AEROBIC CULTURE W GRAM STAIN (SUPERFICIAL SPECIMEN): Gram Stain: NONE SEEN

## 2022-01-27 LAB — MAGNESIUM: Magnesium: 1.8 mg/dL (ref 1.7–2.4)

## 2022-01-27 MED ORDER — METRONIDAZOLE 500 MG PO TABS
500.0000 mg | ORAL_TABLET | Freq: Two times a day (BID) | ORAL | 0 refills | Status: AC
  Filled 2022-01-27: qty 15, 8d supply, fill #0

## 2022-01-27 MED ORDER — ASPIRIN EC 325 MG PO TBEC
325.0000 mg | DELAYED_RELEASE_TABLET | Freq: Two times a day (BID) | ORAL | 0 refills | Status: AC
  Filled 2022-01-27 (×2): qty 42, 21d supply, fill #0

## 2022-01-27 MED ORDER — AMOXICILLIN-POT CLAVULANATE 875-125 MG PO TABS: 1.0000 | ORAL_TABLET | Freq: Two times a day (BID) | ORAL | Status: DC

## 2022-01-27 MED ORDER — INSULIN GLARGINE SOLOSTAR 100 UNIT/ML ~~LOC~~ SOPN
26.0000 [IU] | PEN_INJECTOR | Freq: Every day | SUBCUTANEOUS | 11 refills | Status: DC
  Filled 2022-01-27: qty 6, 23d supply, fill #0

## 2022-01-27 MED ORDER — INSULIN ASPART 100 UNIT/ML IJ SOLN
4.0000 [IU] | Freq: Three times a day (TID) | INTRAMUSCULAR | Status: DC
  Administered 2022-01-27 (×2): 4 [IU] via SUBCUTANEOUS

## 2022-01-27 MED ORDER — ORITAVANCIN DIPHOSPHATE 400 MG IV SOLR
1200.0000 mg | Freq: Once | INTRAVENOUS | Status: AC
  Administered 2022-01-27: 1200 mg via INTRAVENOUS
  Filled 2022-01-27: qty 120

## 2022-01-27 MED ORDER — INSULIN ASPART 100 UNIT/ML IJ SOLN
0.0000 [IU] | Freq: Three times a day (TID) | INTRAMUSCULAR | Status: DC
  Administered 2022-01-27: 8 [IU] via SUBCUTANEOUS
  Administered 2022-01-27: 11 [IU] via SUBCUTANEOUS

## 2022-01-27 MED ORDER — INSULIN ASPART 100 UNIT/ML IJ SOLN: 0.0000 [IU] | Freq: Every day | INTRAMUSCULAR | Status: DC

## 2022-01-27 MED ORDER — AMOXICILLIN-POT CLAVULANATE 875-125 MG PO TABS
1.0000 | ORAL_TABLET | Freq: Two times a day (BID) | ORAL | 0 refills | Status: AC
Start: 2022-02-04 — End: 2022-03-07
  Filled 2022-01-27: qty 62, 31d supply, fill #0

## 2022-01-27 NOTE — Progress Notes (Signed)
Inpatient Diabetes Program Recommendations ? ?AACE/ADA: New Consensus Statement on Inpatient Glycemic Control (2015) ? ?Target Ranges:  Prepandial:   less than 140 mg/dL ?     Peak postprandial:   less than 180 mg/dL (1-2 hours) ?     Critically ill patients:  140 - 180 mg/dL  ? ?Lab Results  ?Component Value Date  ? GLUCAP 308 (H) 01/27/2022  ? HGBA1C 11.2 (H) 01/24/2022  ? ? ?Review of Glycemic Control ? Latest Reference Range & Units 01/26/22 14:33 01/26/22 15:14 01/26/22 16:31 01/26/22 21:34 01/27/22 07:33 01/27/22 12:42 01/27/22 12:46  ?Glucose-Capillary 70 - 99 mg/dL 65 (L) 967 (H) 893 (H) 271 (H) 261 (H) 335 (H) 308 (H)  ?Diabetes history: DM 2 ?Outpatient Diabetes medications: Lantus 20 units, Novolog 8 units tid ?Current orders for Inpatient glycemic control:  ?Semglee 23 units units daily ?Novolog 0-15 units tid + hs ?Novolog 4 units tid meal coverage ?  ?Inpatient Diabetes Program Recommendations:   ? ?Please consider increasing Semglee to 26 units daily.  ? ?Thanks,  ?Beryl Meager, RN, BC-ADM ?Inpatient Diabetes Coordinator ?Pager [phone]  (8a-5p) ? ? ?

## 2022-01-27 NOTE — Progress Notes (Addendum)
? ?[facility] Infectious Diseases Follow Up Note ? ?Patient Identification: ?Patient Name: [name] MRN: [id number] Admit Date: 01/24/2022  1:47 PM ?Age: 31 y.o.Today's Date: 01/27/2022 ? ?Reason for Visit: osteomyelitis  ? ?Principal Problem: ?  Right foot cellulitis with abscess and early osteomyelitis in patient with uncontrolled diabetes ?Active Problems: ?  Neuropathy ?  Opioid use disorder, mild, in sustained remission, on maintenance therapy, abuse (HCC) ?  Uncontrolled IDDM-2 with hyperglycemia, neuropathy and diabetic foot ulcer infection ?  Pseudohyponatremia ?  Asthma ?  Sepsis due to diabetic right foot infection/osteomyelitis ?  Acute osteomyelitis of right foot (HCC) ?  Bandemia ? ?Antibiotics:  ?Vancomycin 3/28 ?Zosyn 3/28 ?Cefepime 3/28 ?Ceftriaxone 3/29-c ?Metronidazole 3/29-c ?  ?Lines/Hardware: ?  ?Interval Events: Afebrile, leukocytosis downtrending ? ? ?Assessment ?# Rt foot Diabetic foot abscess/Osteomyelitis ( 5th metatarsal and 5th proximal phalanx) ?-S/p Rt foot I and D by Ortho 3/28 ?-3/28 wound cx ( 12:13)  strep agalactiae ( before abtx) ?-3/28 wound cx ( 16:41) strep agalactiae  ?  ?-3/28 abscess cx GPC and GPR, strep agalactiae ?- MRSA PCR negative  ?- has good pulses DP  in the left foot. Rt foot is bandaged ?- ABI 3/29 with no abnormality  ?  ?# Poorly controlled DM( a1c 11.2) with hyperglycemia  ?# Smoking - counseled ?# Opioid use d/o - not a PICC candidate, HIV negative, HCV negative in 2021, on suboxone currently  ?# Leukocytosis - downtrending  ? ?Recommendations ?Will plan for one dose of Oritavancin IV today and plan for 7 days of metronidazole until 4/6 followed by Augmentin for approx 5 weeks from 4/7  ?Discussed with patient  ?Will follow in the clinic in 2 weeks. Please call with questions ? ?Rest of the management as per the primary team. ?Thank you for the consult. Please page with pertinent questions or  concerns. ? ?______________________________________________________________________ ?Subjective ?patient seen and examined at the bedside.  ?He is eager to go home.  ?No concerns otherwise  ? ?Vitals ?BP 130/81 (BP Location: Right Arm)   Pulse 83   Temp 98.1 ?F (36.7 ?C) (Oral)   Resp 17   Ht 5\' 6"  (1.676 m)   Wt 78.4 kg   SpO2 100%   BMI 27.90 kg/m?  ? ?  ?Physical Exam ?Constitutional: Lying in the bed and appears comfortable ?   Comments:  ? ?Cardiovascular:  ?   Rate and Rhythm: Normal rate and regular rhythm.  ?   Heart sounds:  ? ?Pulmonary:  ?   Effort: Pulmonary effort is normal on room air ?   Comments:  ? ?Abdominal:  ?   Palpations: Abdomen is soft.  ?   Tenderness: Nondistended ? ?Musculoskeletal:     ?   General: No swelling or tenderness.  Right foot is bandaged ? ?Skin: ?   Comments:  ? ?Neurological:  ?   General: Grossly nonfocal, awake alert and oriented ? ?Psychiatric:     ?   Mood and Affect: Mood normal.  ? ?Pertinent Microbiology ?Results for orders placed or performed during the hospital encounter of 01/24/22  ?Blood culture (routine x 2)     Status: None (Preliminary result)  ? Collection Time: 01/24/22  2:20 PM  ? Specimen: BLOOD  ?Result Value Ref Range Status  ? Specimen Description   Final  ?  BLOOD RIGHT ANTECUBITAL ?Performed at [facility], [address] [place], [place] ?  ? Special Requests   Final  ?  BOTTLES  DRAWN AEROBIC AND ANAEROBIC Blood Culture adequate volume ?Performed at [facility], [address], [place], [place] [address] ?  ? Culture   Final  ?  NO GROWTH 3 DAYS ?Performed at [facility], [address] [place], [place] [address] ?  ? Report Status PENDING  Incomplete  ?Blood culture (routine x 2)     Status: None (Preliminary result)  ? Collection Time: 01/24/22  4:10 PM  ? Specimen: BLOOD  ?Result Value Ref Range Status  ? Specimen Description   Final  ?  BLOOD BLOOD LEFT FOREARM ?Performed at [facility], [address] [place], [place] [address] ?  ? Special Requests   Final  ?  BOTTLES DRAWN AEROBIC AND ANAEROBIC Blood Culture adequate volume ?Performed at [facility], [address] [place], [place] [address] ?  ? Culture   Final  ?  NO GROWTH 3 DAYS ?Performed at [facility], [address], [place], [place] [address] ?  ? Report Status PENDING  Incomplete  ?Aerobic Culture w Gram Stain (superficial specimen)     Status: None  ? Collection Time: 01/24/22  4:41 PM  ? Specimen: Wound  ?Result Value Ref Range Status  ? Specimen Description   Final  ?  WOUND ?Performed at [facility], [address], [place], [place] [address] ?  ? Special Requests   Final  ?  NONE RIGHT FOOT ?Performed at [facility], [address], [place], [place] [address] ?  ? Gram Stain   Final  ?  FEW WBC PRESENT, PREDOMINANTLY PMN ?ABUNDANT GRAM POSITIVE COCCI ?  ? Culture   Final  ?  FEW GROUP B STREP(S.AGALACTIAE)ISOLATED ?TESTING AGAINST S. AGALACTIAE NOT ROUTINELY PERFORMED DUE TO PREDICTABILITY OF AMP/PEN/VAN SUSCEPTIBILITY. ?Performed at [facility], [address] [place], [place] [address] ?  ? Report Status 01/27/2022 FINAL  Final  ?Aerobic Culture w Gram Stain (superficial specimen)     Status: None  ? Collection Time: 01/24/22  9:09 PM  ? Specimen: Abscess  ?Result Value Ref Range Status  ? Specimen Description ABSCESS  Final  ? Special Requests NONE  Final  ? Gram Stain   Final  ?  NO SQUAMOUS EPITHELIAL CELLS SEEN ?FEW WBC SEEN ?MODERATE GRAM POSITIVE COCCI ?  ? Culture   Final  ?  FEW GROUP B STREP(S.AGALACTIAE)ISOLATED ?TESTING AGAINST S. AGALACTIAE NOT ROUTINELY PERFORMED DUE TO PREDICTABILITY OF AMP/PEN/VAN SUSCEPTIBILITY. ?Performed at [facility], [address] [place], [place] [address] ?  ? Report Status 01/27/2022 FINAL  Final  ?Aerobic Culture w Gram Stain (superficial specimen)     Status: None (Preliminary  result)  ? Collection Time: 01/24/22  9:10 PM  ? Specimen: Abscess  ?Result Value Ref Range Status  ? Specimen Description ABSCESS  Final  ? Special Requests NONE  Final  ? Gram Stain   Final  ?  NO SQUAMOUS EPITHELIAL CELLS SEEN ?FEW WBC SEEN ?FEW GRAM POSITIVE COCCI ?  ? Culture   Final  ?  FEW STREPTOCOCCUS AGALACTIAE ?TESTING AGAINST S. AGALACTIAE NOT ROUTINELY PERFORMED DUE TO PREDICTABILITY OF AMP/PEN/VAN SUSCEPTIBILITY. ?CULTURE REINCUBATED FOR BETTER GROWTH ?Performed at [facility], [address] [place], [place] [address] ?  ? Report Status PENDING  Incomplete  ?Aerobic/Anaerobic Culture w Gram Stain (surgical/deep wound)     Status: None (Preliminary result)  ? Collection Time: 01/24/22  9:11 PM  ?  Specimen: PATH Other; Tissue  ?Result Value Ref Range Status  ? Specimen Description   Final  ?  TISSUE ?Performed at [facility], [address] [place], [place] [address] ?  ? Special Requests   Final  ?  ABCESS ?Performed at [facility], [address] [place], [place] [address] ?  ? Gram Stain   Final  ?  NO SQUAMOUS EPITHELIAL CELLS SEEN ?MODERATE WBC SEEN ?FEW GRAM POSITIVE RODS ?MODERATE GRAM POSITIVE COCCI ?  ? Culture   Final  ?  FEW GROUP B STREP(S.AGALACTIAE)ISOLATED ?TESTING AGAINST S. AGALACTIAE NOT ROUTINELY PERFORMED DUE TO PREDICTABILITY OF AMP/PEN/VAN SUSCEPTIBILITY. ?HOLDING FOR POSSIBLE ANAEROBE ?CULTURE REINCUBATED FOR BETTER GROWTH ?Performed at [facility], [address] [place], [place] [address] ?  ? Report Status PENDING  Incomplete  ? ? ? ?Pertinent Lab. ? ?  Latest Ref Rng & Units 01/27/2022  ?  3:32 AM 01/26/2022  ?  3:27 AM 01/25/2022  ?  3:05 AM  ?CBC  ?WBC 4.0 - 10.5 K/uL 10.6   11.7   16.3    ?Hemoglobin 13.0 - 17.0 g/dL 91.411.8   78.212.0   95.614.2    ?Hematocrit 39.0 - 52.0 % 34.9   35.1   40.7    ?Platelets 150 - 400 K/uL 328   264   261    ? ? ?  Latest Ref Rng & Units 01/27/2022  ?  3:32 AM 01/26/2022  ?  3:27 AM 01/25/2022  ?   3:05 AM  ?CMP  ?Glucose 70 - 99 mg/dL 213249   086237   578296    ?BUN 6 - 20 mg/dL 12   10   12     ?Creatinine 0.61 - 1.24 mg/dL 4.690.38   6.290.39   5.280.53    ?Sodium 135 - 145 mmol/L 134   134   133    ?Potassium 3.5 - 5.1 mmol/L 4.1

## 2022-01-27 NOTE — Discharge Summary (Signed)
? ?Physician Discharge Summary  ?Gerhard Natividad ZOX:[id number] DOB: May 10, 1991 DOA: 01/24/2022 ? ?PCP: Gaylan Gerold, DO ? ?Admit date: 01/24/2022 ?Discharge date: 01/27/2022 ?Admitted From: Home ?Disposition: Home ?Recommendations for Outpatient Follow-up:  ?Follow ups as below. ?Please obtain CBC/BMP/Mag at follow up ?Please follow up on the following pending results: None ? ?Home Health: Not indicated ?Equipment/Devices: Not indicated ? ?Discharge Condition: Stable ?CODE STATUS: Full code ? Follow-up Information   ? ? Gaylan Gerold, DO. Schedule an appointment as soon as possible for a visit in 1 week(s).   ?Specialty: Internal Medicine ?Contact information: ?[address] ?[place] [address] ?[phone] ? ? ?  ?  ? ? Armond Hang, MD. Schedule an appointment as soon as possible for a visit in 1 week(s).   ?Specialty: Orthopedic Surgery ?Contact information: ?[address] ?[place] [address] ?[phone] ? ? ?  ?  ? ? [facility]             . Schedule an appointment as soon as possible for a visit in 2 week(s).   ?Contact information: ?[place] [phone] ? ?  ?  ? ?  ?  ? ?  ? ? ?Hospital course ?31 year old M with PMH of IDDM-2, HTN, opioid use disorder on Suboxone, and asthma presenting with right foot infection that has started 4 days prior to presentation, and admitted for right foot cellulitis with abscess and early osteomyelitis and right fifth metatarsal and fifth proximal phalanx.  He was febrile to 100.9.  Slightly tachycardic.  Had leukocytosis to 16.6 with left shift.  Lactic acid negative.  CRP elevated to 20.  ESR 47.  Blood cultures obtained.  Started on broad-spectrum antibiotics.  Orthopedic surgery consulted.  Underwent right foot I&D of an abscess on 3/28. ? ?Superficial and deep wound culture Streptococcus agalactia. Infectious disease consulted.  De-escalated antibiotics to IV ceftriaxone and  p.o. Flagyl.  Given history of opiate dependence, not a candidate for home IV infusion.  Received one dose of Oritavancin IV on the day of discharge.  He is discharged on 7 days of metronidazole until 4/6 followed by Augmentin for approx 5 weeks from 4/7 per ID recommendation.  ID to arrange outpatient follow-up in 2 weeks.  Patient to follow-up with orthopedic surgery in 1 week.  DVT PPx, dressing change, wound care and weightbearing per orthopedic surgery. ? ?In regards to uncontrolled diabetes, home Lantus increased from 23 to 26 units.  Patient to continue home short acting insulin 3 times daily with meals.  He has been advised to check his blood glucose 3 times a day 15 minutes before meals and keep blood glucose log.  Also advised to increase his Lantus by 2 units the next day if his Premeal blood glucose results where persistently higher than 180.  He was counseled on the importance of good diabetic control for wound healing and infection control.  Encouraged to follow-up with his primary care doctor or endocrinologist for further adjustment and evaluation of his diabetes.  ? ?See individual problem list below for more on hospital course. ? ?Problems addressed during this hospitalization ?Problem  ?Right foot cellulitis with abscess and early osteomyelitis in patient with uncontrolled diabetes  ?Sepsis due to diabetic right foot infection/osteomyelitis  ?Uncontrolled IDDM-2 with hyperglycemia, neuropathy and diabetic foot ulcer infection  ?Pseudohyponatremia  ?Opioid Use Disorder, Mild, in Sustained Remission, On Maintenance Therapy, Abuse (Hcc)  ?Neuropathy  ?Bandemia  ?Asthma  ?  ?  Assessment and Plan: ?* Right foot cellulitis with abscess and early osteomyelitis in patient with uncontrolled diabetes ?MRI concerning for right foot cellulitis with possible necrotizing infection and early osteomyelitis of right fifth metatarsal and fifth distal phalanx.  CRP 20.  ESR 47.  Blood cultures NGTD.  MRSA PCR screen  negative.  Superficial and deep wound culture with Streptococcus agalactiae.  ABI normal. ?-S/p I&D by Dr. Ashby Dawes on 3/28.  ?-Wound care per orthopedic surgery-recommended daily dressing change ?-Weightbearing on right heel per orthopedic surgery ?-3/28 vanc and Zosyn> 3/29 CTX and Flagyl>> Oritavancin 3/31>> Flagyl 3/31 to 4/6>> Augmentin for 5 days after 4/7 per infectious disease recommendation. ?-Not a candidate for home IV infusion given opiate dependence ?-Infectious disease to arrange outpatient follow-up in 2 weeks ?-Aspirin 325 mg twice daily for 3 weeks for VTE prophylaxis per orthopedic surgery ?-Heel weightbearing on RLE and daily dressing change per orthopedic surgery ?-Outpatient follow-up with orthopedic surgery in 7 days ?-Extensive discussion on the importance of good diabetic control ? ?Sepsis due to diabetic right foot infection/osteomyelitis ?See above ? ?Uncontrolled IDDM-2 with hyperglycemia, neuropathy and diabetic foot ulcer infection ?A1c 11.2%.  Admits to noncompliance with his insulin.  ?Recent Labs  ?Lab 01/26/22 ?1631 01/26/22 ?2134 01/27/22 ?0349 01/27/22 ?1242 01/27/22 ?1246  ?GLUCAP 239* 271* 261* 335* 308*  ?-Increased home Lantus from 23 to 26 units daily ?-Continue home NovoLog 3 times daily ?-CBG monitoring 3 times a day before meals and CBG log ?-Counseled on adjustment of his long-acting insulin based on his CBG as above ?-Carb modified diet ?-Outpatient follow-up with PCP or endocrinologist in 1 to 2 weeks ?-May consider adding oral agents ? ?Pseudohyponatremia ?Corrects to normal for hyperglycemia. ? ?Opioid use disorder, mild, in sustained remission, on maintenance therapy, abuse ([place]) ?Patient reports taking Suboxone 3 times a day.  Confirmed a narcotic database. ?-Continue home Suboxone to 3 times a day ? ?Neuropathy ?Continue home gabapentin, Cymbalta and Suboxone ? ?Bandemia ?Resolving. ? ?Asthma ?No acute exacerbation.  ? ? ?  ? ?Vital signs ?Vitals:  ? 01/26/22  1439 01/26/22 2137 01/27/22 0555 01/27/22 1545  ?BP: (!) 145/79 124/80 130/81 122/86  ?Pulse: 95 80 83 84  ?Temp:  98 ?F (36.7 ?C) 98.1 ?F (36.7 ?C) 97.9 ?F (36.6 ?C)  ?Resp: '18 17 17 14  ' ?Height:      ?Weight:      ?SpO2: 100% 100% 100% 100%  ?TempSrc:  Oral Oral Oral  ?BMI (Calculated):      ?  ? ?Discharge exam ? ?GENERAL: No apparent distress.  Nontoxic. ?HEENT: MMM.  Vision and hearing grossly intact.  ?NECK: Supple.  No apparent JVD.  ?RESP:  No IWOB.  Fair aeration bilaterally. ?CVS:  RRR. Heart sounds normal.  ?ABD/GI/GU: BS+. Abd soft, NTND.  ?MSK/EXT:  Moves extremities.  Dressing over right foot DCI.  Neurovascular intact in toes. ?SKIN: Dressing over right foot DCI. ?NEURO: Awake and alert. Oriented appropriately.  No apparent focal neuro deficit. ?PSYCH: Calm. Normal affect.  ? ?Discharge Instructions ?Discharge Instructions   ? ? Call MD for:  redness, tenderness, or signs of infection (pain, swelling, redness, odor or green/yellow discharge around incision site)   Complete by: As directed ?  ? Call MD for:  severe uncontrolled pain   Complete by: As directed ?  ? Call MD for:  temperature >100.4   Complete by: As directed ?  ? Diet Carb Modified   Complete by: As directed ?  ? Discharge instructions  Complete by: As directed ?  ? It has been a pleasure taking care of you! ? ?You were hospitalized due to right foot infection for which you have been treated with surgery and antibiotics.  We are discharging you on more antibiotics to complete treatment course.  It is very important that you take your medications as prescribed.  It is also very important that you take your insulin as prescribed to have your blood glucose under good control.  Check your blood glucose at least 3 times a day about 15 minutes before each meals, and write down the numbers.  You may increase your Lantus (long-acting insulin) by 2 units the next day if your blood glucose is persistently over 180.  Follow-up with your primary  care doctor in 1 to 2 weeks or sooner if needed.  Take your blood glucose log to your primary care doctor or endocrinologist at your follow-up. ? ?We strongly recommend not drinking alcohol while taking Fla

## 2022-01-27 NOTE — Addendum Note (Signed)
Addendum  created 01/27/22 0713 by Minerva Ends, CRNA  ? Clinical Note Signed  ?  ?

## 2022-01-27 NOTE — Progress Notes (Signed)
?PROGRESS NOTE ? ?[name] GOT:[id number] DOB: July 05, 1991  ? ?PCP: Gaylan Gerold, DO ? ?Patient is from: Home. ? ?DOA: 01/24/2022 LOS: 3 ? ?Chief complaints ?Chief Complaint  ?Patient presents with  ? Wound Infection  ?  ? ?Brief Narrative / Interim history: ?31 year old M with PMH of IDDM-2, HTN, opioid use disorder on Suboxone, and asthma presenting with right foot infection that has started 4 days prior to presentation, and admitted for right foot cellulitis with abscess and early osteomyelitis and right fifth metatarsal and fifth proximal phalanx.  He was febrile to 100.9.  Slightly tachycardic.  Had leukocytosis to 16.6 with left shift.  Lactic acid negative.  CRP elevated to 20.  ESR 47.  Blood cultures obtained.  Started on broad-spectrum antibiotics.  Orthopedic surgery consulted.  Underwent right foot I&D of an abscess on 3/28. ? ?Superficial wound culture Streptococcus agalactia.  Deep abscess culture with few GPR and moderate GPC's.  Infectious disease consulted.  De-escalated antibiotics to IV ceftriaxone and p.o. Flagyl.  ? ?Subjective: ?Seen and examined earlier this morning.  No major events overnight of this morning.  He had an episode of hypoglycemia to 60s yesterday afternoon.  Now blood sugar in 200s.  Pain well controlled.  Eager to go home but understands the importance of staying in the hospital to treat his foot infection appropriately.  He says he was not aware about osteomyelitis/bone infection.  Extensive counseling on the importance of compliance with his medication and getting his diabetes under good control.  ? ?Objective: ?Vitals:  ? 01/26/22 0609 01/26/22 1439 01/26/22 2137 01/27/22 0555  ?BP: 130/87 (!) 145/79 124/80 130/81  ?Pulse: 87 95 80 83  ?Resp: '17 18 17 17  ' ?Temp: 98.7 ?F (37.1 ?C)  98 ?F (36.7 ?C) 98.1 ?F (36.7 ?C)  ?TempSrc: Oral  Oral Oral  ?SpO2: 100% 100% 100% 100%  ?Weight:      ?Height:      ? ? ?Examination: ? ?GENERAL: No apparent distress.  Nontoxic. ?HEENT:  MMM.  Vision and hearing grossly intact.  ?NECK: Supple.  No apparent JVD.  ?RESP:  No IWOB.  Fair aeration bilaterally. ?CVS:  RRR. Heart sounds normal.  ?ABD/GI/GU: BS+. Abd soft, NTND.  ?MSK/EXT:  Moves extremities.  Dressing over right foot DCI.  Intact sensation in his toes. ?SKIN: Dressing over right foot DCI. ?NEURO: Awake and alert. Oriented appropriately.  No apparent focal neuro deficit. ?PSYCH: Calm. Normal affect.  ? ?Procedures:  ?3/28-I&D of right foot infection ? ?Microbiology summarized: ?3/28-blood cultures NGTD. ?3/28-superficial wound culture with Streptococcus agalactia/GPC's ?3/28-deep abscess culture with few GPR and moderate GPC's ? ?Assessment and Plan: ?* Right foot cellulitis with abscess and early osteomyelitis in patient with uncontrolled diabetes ?MRI concerning for right foot cellulitis with possible necrotizing infection and early osteomyelitis of right fifth metatarsal and fifth distal phalanx.  CRP 20.  ESR 47.  Blood cultures NGTD.  Superficial wound culture with Streptococcus agalactiae.  Deep abscess culture with few GPR and moderate GPC's.  ABI normal. ?-S/p I&D by Dr. Ashby Dawes on 3/28.  ?-Wound care per orthopedic surgery-recommended daily dressing change ?-Weightbearing on right heel per orthopedic surgery ?-ID consulted and de-escalated antibiotics: 3/28 vanc and Zosyn> 3/29 CTX and Flagyl>>> ?-Might not be a candidate for home IV infusion given opiate dependence ?-Follow further culture speciation and sensitivity ?-On Suboxone and tramadol for pain control.  ?-On Lovenox for VTE prophylaxis ? ?Sepsis due to diabetic right foot infection/osteomyelitis ?See above ? ?Uncontrolled IDDM-2 with hyperglycemia, neuropathy  and diabetic foot ulcer infection ?A1c 11.2%.  Admits to noncompliance with his insulin.  Hypoglycemic to 60s yesterday afternoon ?Recent Labs  ?Lab 01/26/22 ?1433 01/26/22 ?1514 01/26/22 ?1631 01/26/22 ?2134 01/27/22 ?6269  ?GLUCAP 65* 169* 239* 271* 261*   ?-Continue SSI-moderate ?-Resume mealtime coverage at 4 units ?-Continue basal insulin at 23 units daily ?-Monitor CBG and adjust insulin as appropriate ?-Appreciate input by diabetic coordinator. ?-Educated on the dangers of uncontrolled diabetes and counseled on the importance of compliance ? ? ?Pseudohyponatremia ?Corrects to normal for hyperglycemia. ? ?Opioid use disorder, mild, in sustained remission, on maintenance therapy, abuse ([place]) ?Patient reports taking Suboxone 3 times a day.  Confirmed a narcotic database. ?-Continue home Suboxone to 3 times a day ? ?Neuropathy ?Continue home gabapentin, Cymbalta and Suboxone ?Continue tramadol ? ?Bandemia ?Likely due to infection.  Improving. ? ?Asthma ?No acute exacerbation.  ?PRN albuterol.  ? ? ? ?  ?DVT prophylaxis:  ?enoxaparin (LOVENOX) injection 40 mg Start: 01/25/22 1200 ?SCDs Start: 01/24/22 2251 ? ?Code Status: Full code ?Family Communication: Updated patient's mother over the phone from bedside yesterday.  None at bedside today. ?Level of care: Med-Surg ?Status is: Inpatient ?Remains inpatient appropriate because: Sepsis from diabetic right foot infection/osteomyelitis requiring IV antibiotics ? ? ?Final disposition: TBD ? ?Consultants:  ?Orthopedic surgery ?Infectious disease ? ?Sch Meds:  ?Scheduled Meds: ? buprenorphine-naloxone  1 tablet Sublingual TID  ? docusate sodium  100 mg Oral BID  ? DULoxetine  60 mg Oral Daily  ? enoxaparin (LOVENOX) injection  40 mg Subcutaneous Q24H  ? gabapentin  100 mg Oral TID  ? insulin aspart  0-15 Units Subcutaneous TID WC  ? insulin aspart  0-5 Units Subcutaneous QHS  ? insulin glargine-yfgn  23 Units Subcutaneous Daily  ? metroNIDAZOLE  500 mg Oral Q12H  ? multivitamin with minerals  1 tablet Oral Daily  ? nicotine  14 mg Transdermal Daily  ? Ensure Max Protein  11 oz Oral BID  ? ?Continuous Infusions: ? cefTRIAXone (ROCEPHIN)  IV 2 g (01/26/22 1328)  ? ?PRN Meds:.ibuprofen, ondansetron **OR** ondansetron (ZOFRAN)  IV, traMADol ? ?Antimicrobials: ?Anti-infectives (From admission, onward)  ? ? Start     Dose/Rate Route Frequency Ordered Stop  ? 01/25/22 1400  cefTRIAXone (ROCEPHIN) 2 g in sodium chloride 0.9 % 100 mL IVPB       ? 2 g ?200 mL/hr over 30 Minutes Intravenous Every 24 hours 01/25/22 1208    ? 01/25/22 0600  vancomycin (VANCOREADY) IVPB 1500 mg/300 mL  Status:  Discontinued       ? 1,500 mg ?150 mL/hr over 120 Minutes Intravenous Every 12 hours 01/24/22 1750 01/25/22 1208  ? 01/24/22 2200  metroNIDAZOLE (FLAGYL) tablet 500 mg       ? 500 mg Oral Every 12 hours 01/24/22 1721 01/31/22 2159  ? 01/24/22 2200  ceFEPIme (MAXIPIME) 2 g in sodium chloride 0.9 % 100 mL IVPB  Status:  Discontinued       ? 2 g ?200 mL/hr over 30 Minutes Intravenous Every 8 hours 01/24/22 1750 01/25/22 1208  ? 01/24/22 2138  vancomycin (VANCOCIN) powder  Status:  Discontinued       ?   As needed 01/24/22 2139 01/24/22 2239  ? 01/24/22 2030  ceFAZolin (ANCEF) IVPB 2g/100 mL premix       ? 2 g ?200 mL/hr over 30 Minutes Intravenous  Once 01/24/22 2028 01/24/22 2050  ? 01/24/22 2028  ceFAZolin (ANCEF) 2-4 GM/100ML-% IVPB       ?  Note to Pharmacy: Cynda Familia: cabinet override  ?    01/24/22 2028 01/24/22 2101  ? 01/24/22 1545  piperacillin-tazobactam (ZOSYN) IVPB 3.375 g       ? 3.375 g ?100 mL/hr over 30 Minutes Intravenous  Once 01/24/22 1540 01/24/22 1611  ? 01/24/22 1545  vancomycin (VANCOREADY) IVPB 2000 mg/400 mL       ? 2,000 mg ?200 mL/hr over 120 Minutes Intravenous  Once 01/24/22 1542 01/24/22 1814  ? ?  ? ? ? ?I have personally reviewed the following labs and images: ?CBC: ?Recent Labs  ?Lab 01/21/22 ?0022 01/24/22 ?1411 01/25/22 ?3710 01/26/22 ?0327 01/27/22 ?6269  ?WBC 10.8* 16.6* 16.3* 11.7* 10.6*  ?NEUTROABS 6.7 14.0*  --   --   --   ?HGB 15.1 15.0 14.2 12.0* 11.8*  ?HCT 43.5 43.9 40.7 35.1* 34.9*  ?MCV 85.3 85.9 84.1 85.4 86.4  ?PLT 249 254 261 264 328  ? ?BMP &GFR ?Recent Labs  ?Lab 01/21/22 ?0022 01/24/22 ?1411  01/25/22 ?4854 01/26/22 ?0327 01/27/22 ?6270  ?NA 134* 134* 133* 134* 134*  ?K 4.5 4.2 3.8 3.7 4.1  ?CL 99 96* 100 98 98  ?CO2 '30 26 26 30 30  ' ?GLUCOSE 336* 239* 296* 237* 249*  ?BUN 5* '7 12 10 12  ' ?CREATININE 0.58* 0.54

## 2022-01-28 ENCOUNTER — Other Ambulatory Visit (HOSPITAL_COMMUNITY): Payer: Self-pay

## 2022-01-28 LAB — AEROBIC CULTURE W GRAM STAIN (SUPERFICIAL SPECIMEN): Gram Stain: NONE SEEN

## 2022-01-28 LAB — AEROBIC/ANAEROBIC CULTURE W GRAM STAIN (SURGICAL/DEEP WOUND): Gram Stain: NONE SEEN

## 2022-01-29 LAB — CULTURE, BLOOD (ROUTINE X 2)
Culture: NO GROWTH
Culture: NO GROWTH
Special Requests: ADEQUATE
Special Requests: ADEQUATE

## 2022-01-30 ENCOUNTER — Other Ambulatory Visit (HOSPITAL_COMMUNITY): Payer: Self-pay

## 2022-01-30 MED ORDER — OXYCODONE HCL 5 MG PO TABS
5.0000 mg | ORAL_TABLET | Freq: Four times a day (QID) | ORAL | 0 refills | Status: DC
  Filled 2022-01-30: qty 28, 7d supply, fill #0

## 2022-02-01 ENCOUNTER — Telehealth: Payer: Self-pay | Admitting: Student

## 2022-02-01 NOTE — Telephone Encounter (Signed)
TOC HFU APPT ? ?Name: Petar, Mucci MRN: [id number]  ?Date: 02/09/2022 Status: Sch  ?Time: 10:15 AM Length: 30  ?Visit Type: OPEN ESTABLISHED [726] Copay: $0.00  ?Provider: Doran Stabler, DO    ? ?

## 2022-02-04 ENCOUNTER — Other Ambulatory Visit (HOSPITAL_COMMUNITY): Payer: Self-pay

## 2022-02-06 ENCOUNTER — Other Ambulatory Visit: Payer: Self-pay | Admitting: Internal Medicine

## 2022-02-06 ENCOUNTER — Other Ambulatory Visit (HOSPITAL_COMMUNITY): Payer: Self-pay

## 2022-02-06 DIAGNOSIS — F1110 Opioid abuse, uncomplicated: Secondary | ICD-10-CM

## 2022-02-07 ENCOUNTER — Other Ambulatory Visit (HOSPITAL_COMMUNITY): Payer: Self-pay

## 2022-02-07 ENCOUNTER — Other Ambulatory Visit: Payer: Self-pay | Admitting: Internal Medicine

## 2022-02-07 ENCOUNTER — Inpatient Hospital Stay: Payer: Self-pay | Admitting: Infectious Diseases

## 2022-02-07 DIAGNOSIS — F1110 Opioid abuse, uncomplicated: Secondary | ICD-10-CM

## 2022-02-08 ENCOUNTER — Other Ambulatory Visit: Payer: Self-pay

## 2022-02-08 ENCOUNTER — Ambulatory Visit (INDEPENDENT_AMBULATORY_CARE_PROVIDER_SITE_OTHER): Payer: Self-pay | Admitting: Infectious Diseases

## 2022-02-08 ENCOUNTER — Other Ambulatory Visit (HOSPITAL_COMMUNITY): Payer: Self-pay

## 2022-02-08 ENCOUNTER — Other Ambulatory Visit: Payer: Self-pay | Admitting: Internal Medicine

## 2022-02-08 ENCOUNTER — Encounter: Payer: Self-pay | Admitting: Infectious Diseases

## 2022-02-08 VITALS — BP 162/95 | HR 65 | Temp 97.3°F | Resp 16 | Wt 190.2 lb

## 2022-02-08 DIAGNOSIS — F1110 Opioid abuse, uncomplicated: Secondary | ICD-10-CM

## 2022-02-08 DIAGNOSIS — Z794 Long term (current) use of insulin: Secondary | ICD-10-CM

## 2022-02-08 DIAGNOSIS — Z5181 Encounter for therapeutic drug level monitoring: Secondary | ICD-10-CM

## 2022-02-08 DIAGNOSIS — M86171 Other acute osteomyelitis, right ankle and foot: Secondary | ICD-10-CM

## 2022-02-08 DIAGNOSIS — F17210 Nicotine dependence, cigarettes, uncomplicated: Secondary | ICD-10-CM

## 2022-02-08 DIAGNOSIS — M869 Osteomyelitis, unspecified: Secondary | ICD-10-CM | POA: Insufficient documentation

## 2022-02-08 DIAGNOSIS — F17200 Nicotine dependence, unspecified, uncomplicated: Secondary | ICD-10-CM

## 2022-02-08 DIAGNOSIS — E1169 Type 2 diabetes mellitus with other specified complication: Secondary | ICD-10-CM

## 2022-02-08 DIAGNOSIS — E118 Type 2 diabetes mellitus with unspecified complications: Secondary | ICD-10-CM

## 2022-02-08 NOTE — Progress Notes (Addendum)
? ?  ? ? ?Patient Active Problem List  ? Diagnosis Date Noted  ? Acute osteomyelitis of right foot ([place]) 01/25/2022  ? Right foot cellulitis with abscess and early osteomyelitis in patient with uncontrolled diabetes 01/24/2022  ? Uncontrolled IDDM-2 with hyperglycemia, neuropathy and diabetic foot ulcer infection 01/24/2022  ? Asthma   ? Callus 10/19/2021  ? Anxiety and depression 03/22/2021  ? Erectile dysfunction 09/15/2020  ? Polycythemia 08/19/2020  ? Opioid use disorder, mild, in sustained remission, on maintenance therapy, abuse ([place]) 08/05/2020  ? Neuropathy 07/23/2020  ? 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 Smoking 08/01/2017  ? ? ?Patient's Medications  ?New Prescriptions  ? No medications on file  ?Previous Medications  ? AMOXICILLIN-CLAVULANATE (AUGMENTIN) 875-125 MG TABLET    Take 1 tablet by mouth every 12 (twelve) hours. Start 02/04/22 ?End 03/07/22  ? ASPIRIN EC 325 MG TABLET    Take 1 tablet (325 mg total) by mouth in the morning and at bedtime for 21 days.  ? BLOOD GLUCOSE MONITORING SUPPL (TRUE METRIX METER) W/DEVICE KIT    USE AS DIRECTED  ? DULOXETINE (CYMBALTA) 60 MG CAPSULE    Take 1 capsule (60 mg total) by mouth daily.  ? GABAPENTIN (NEURONTIN) 100 MG CAPSULE    Take 1 capsule (100 mg total) by mouth 3 (three) times daily.  ? GLUCOSE BLOOD (TRUE METRIX BLOOD GLUCOSE TEST) TEST STRIP    Use as directed  ? INSULIN ASPART (NOVOLOG FLEXPEN) 100 UNIT/ML FLEXPEN    Inject 7 Units into the skin 3 (three) times daily with meals.  ? INSULIN GLARGINE SOLOSTAR (LANTUS) 100 UNIT/ML SOLOSTAR PEN    Inject 26 Units into the skin daily.  ?Modified Medications  ? No medications on file  ?Discontinued Medications  ? No medications on file  ? ? ?Subjective: ?Here for HFU for RT DFU/osteomyelitis. Has had yellowish drainage from the rt foot wound since being discharged. Denies fevers, chills and sweats. Denies nausea,  vomiting and diarrhea. Has completed Metronidazole course last Thursday 4/6 and started taking augmentin twice a since Friday 4/7. Denies missing doses. He was unsure about his follow up with Ortho Dr Kathaleen Bury. We called his office and he has an appointment tomorrow am. Advised to keep the appointment. I can see some sutures inside the wound. I am concerned given the wound has developed slough. He has been using saline and gauzes for wound care after he ran out of the bandages.  ? ?Review of Systems: ?ROS all systems reviewed with pertinent positives and negatives as listed above ? ?Past Medical History:  ?Diagnosis Date  ? Asthma   ? HTN (hypertension) 07/25/2017  ? Lisinopril 92m Daily  ? Hyperglycemia 09/24/2017  ? Type II diabetes mellitus ([place]   ? "dx'd 06/2017"  ? ?Past Surgical History:  ?Procedure Laterality Date  ? ADENOIDECTOMY    ? INCISION AND DRAINAGE ABSCESS Right 01/24/2022  ? Procedure: INCISION AND DRAINAGE ABSCESS;  Surgeon: RArmond Hang MD;  Location: [facility];  Service: Orthopedics;  Laterality: Right;  ? LACERATION REPAIR Left   ? "stitched finger up"  ? TONSILLECTOMY    ? ? ?Social History  ? ?Tobacco Use  ? Smoking status: Every Day  ?  Packs/day: 0.50  ?  Years: 12.00  ?  Pack years: 6.00  ?  Types: Cigarettes  ? Smokeless tobacco: Never  ? Tobacco comments:  ?  0.5 PPD  ?Vaping Use  ?  Vaping Use: Never used  ?Substance Use Topics  ? Alcohol use: Yes  ?  Comment: OCCASSIONAL  ? Drug use: Yes  ?  Types: Marijuana  ?  Comment: 10/17/2017 ~3 times per week  ? ? ?No family history on file. ? ?Allergies  ?Allergen Reactions  ? Vicodin [Hydrocodone-Acetaminophen] Anaphylaxis  ?  Tolerates tylenol with no allergy  ? Eggs Or Egg-Derived Products Nausea And Vomiting  ? Lactose Intolerance (Gi) Rash  ? ? ?Health Maintenance  ?Topic Date Due  ? COVID-19 Vaccine (1) Never done  ? OPHTHALMOLOGY EXAM  Never done  ? HEMOGLOBIN A1C  04/26/2022  ? FOOT EXAM  05/24/2022  ? INFLUENZA VACCINE  05/30/2022   ? URINE MICROALBUMIN  08/16/2022  ? TETANUS/TDAP  04/03/2027  ? Hepatitis C Screening  Completed  ? HIV Screening  Completed  ? HPV VACCINES  Aged Out  ? ? ?Objective: ?BP (!) 162/95   Pulse 65   Temp (!) 97.3 ?F (36.3 ?C) (Temporal)   Resp 16   Wt 190 lb 3.2 oz (86.3 kg)   SpO2 100%   BMI 30.70 kg/m?  ? ? ?Physical Exam ?Constitutional:   ?   Appearance: Normal appearance.  ?HENT:  ?   Head: Normocephalic and atraumatic.   ?   Mouth: Mucous membranes are moist.  ?Eyes: ?   Conjunctiva/sclera: Conjunctivae normal.  ?   Pupils: ? ?Cardiovascular:  ?   Rate and Rhythm: Normal rate and regular rhythm.  ?   Heart sounds:  ? ?Pulmonary:  ?   Effort: Pulmonary effort is normal.  ?   Breath sounds: Normal breath sounds.  ? ?Abdominal:  ?   General: Non distended  ?   Palpations: soft.  ? ?Musculoskeletal:     ?   General:  ?RT Foot ? ? ? ?Skin: ?   General: Skin is warm and dry.  ?   Comments: ? ?Neurological:  ?   General: grossly non focal  ?   Mental Status: awake, alert and oriented to person, place, and time.  ? ?Psychiatric:     ?   Mood and Affect: Mood normal.  ? ?Lab Results ?Lab Results  ?Component Value Date  ? WBC 10.6 (H) 01/27/2022  ? HGB 11.8 (L) 01/27/2022  ? HCT 34.9 (L) 01/27/2022  ? MCV 86.4 01/27/2022  ? PLT 328 01/27/2022  ?  ?Lab Results  ?Component Value Date  ? CREATININE 0.38 (L) 01/27/2022  ? BUN 12 01/27/2022  ? NA 134 (L) 01/27/2022  ? K 4.1 01/27/2022  ? CL 98 01/27/2022  ? CO2 30 01/27/2022  ?  ?Lab Results  ?Component Value Date  ? ALT 14 01/24/2022  ? AST 13 (L) 01/24/2022  ? ALKPHOS 106 01/24/2022  ? BILITOT 1.6 (H) 01/24/2022  ?  ?Lab Results  ?Component Value Date  ? CHOL 108 01/26/2022  ? HDL 28 (L) 01/26/2022  ? [place] 66 01/26/2022  ? TRIG 70 01/26/2022  ? CHOLHDL 3.9 01/26/2022  ? ?No results found for: LABRPR, RPRTITER ?No results found for: HIV1RNAQUANT, HIV1RNAVL, CD4TABS ?  ?Problem List Items Addressed This Visit   ? ?  ? Endocrine  ? Uncontrolled IDDM-2 with  hyperglycemia, neuropathy and diabetic foot ulcer infection  ?  ? Musculoskeletal and Integument  ? Pyogenic inflammation of bone (HCC)  ?  ? Other  ? Smoking  ? Medication monitoring encounter - Primary  ? Relevant Orders  ? CBC  ? Comprehensive metabolic panel  ?  C-reactive protein  ? Sedimentation rate  ? ?# Rt foot Diabetic foot abscess/Osteomyelitis ( 5th metatarsal and 5th proximal phalanx) ?-S/p Rt foot I and D by Ortho 3/28 ?-3/28 wound cx ( 12:13)  strep agalactiae ( before abtx) ?-3/28 wound cx ( 16:41) strep agalactiae  ?  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  ?# Poorly controlled DM( a1c 11.2) with hyperglycemia/Neuropathy - BG control, fu with PCP ? ?# Smoking - counseled ?  ?I have personally spent 42  minutes involved in face-to-face and non-face-to-face activities for this patient on the day of the visit including staff time, counseling of the patient and coordination of care.  ? ?Roger Oliphant, MD ?[facility] ?[facility] ?02/08/2022, 3:41 PM ? ?

## 2022-02-09 ENCOUNTER — Other Ambulatory Visit (HOSPITAL_COMMUNITY): Payer: Self-pay

## 2022-02-09 ENCOUNTER — Ambulatory Visit (INDEPENDENT_AMBULATORY_CARE_PROVIDER_SITE_OTHER): Payer: Self-pay | Admitting: Student

## 2022-02-09 ENCOUNTER — Encounter: Payer: Self-pay | Admitting: Student

## 2022-02-09 VITALS — BP 146/93 | HR 64 | Temp 98.1°F | Ht 66.0 in | Wt 184.6 lb

## 2022-02-09 DIAGNOSIS — E11628 Type 2 diabetes mellitus with other skin complications: Secondary | ICD-10-CM

## 2022-02-09 DIAGNOSIS — E109 Type 1 diabetes mellitus without complications: Secondary | ICD-10-CM

## 2022-02-09 DIAGNOSIS — F1111 Opioid abuse, in remission: Secondary | ICD-10-CM

## 2022-02-09 DIAGNOSIS — L089 Local infection of the skin and subcutaneous tissue, unspecified: Secondary | ICD-10-CM

## 2022-02-09 DIAGNOSIS — E1110 Type 2 diabetes mellitus with ketoacidosis without coma: Secondary | ICD-10-CM

## 2022-02-09 LAB — COMPREHENSIVE METABOLIC PANEL
AG Ratio: 1.1 (calc) (ref 1.0–2.5)
ALT: 13 U/L (ref 9–46)
AST: 14 U/L (ref 10–40)
Albumin: 3.3 g/dL — ABNORMAL LOW (ref 3.6–5.1)
Alkaline phosphatase (APISO): 83 U/L (ref 36–130)
BUN/Creatinine Ratio: 9 (calc) (ref 6–22)
BUN: 5 mg/dL — ABNORMAL LOW (ref 7–25)
CO2: 30 mmol/L (ref 20–32)
Calcium: 8.6 mg/dL (ref 8.6–10.3)
Chloride: 101 mmol/L (ref 98–110)
Creat: 0.56 mg/dL — ABNORMAL LOW (ref 0.60–1.24)
Globulin: 3 g/dL (calc) (ref 1.9–3.7)
Glucose, Bld: 259 mg/dL — ABNORMAL HIGH (ref 65–99)
Potassium: 4.4 mmol/L (ref 3.5–5.3)
Sodium: 137 mmol/L (ref 135–146)
Total Bilirubin: 0.9 mg/dL (ref 0.2–1.2)
Total Protein: 6.3 g/dL (ref 6.1–8.1)

## 2022-02-09 LAB — CBC
HCT: 38.4 % — ABNORMAL LOW (ref 38.5–50.0)
Hemoglobin: 12.5 g/dL — ABNORMAL LOW (ref 13.2–17.1)
MCH: 29.8 pg (ref 27.0–33.0)
MCHC: 32.6 g/dL (ref 32.0–36.0)
MCV: 91.6 fL (ref 80.0–100.0)
MPV: 10.1 fL (ref 7.5–12.5)
Platelets: 357 10*3/uL (ref 140–400)
RBC: 4.19 10*6/uL — ABNORMAL LOW (ref 4.20–5.80)
RDW: 13.2 % (ref 11.0–15.0)
WBC: 6.1 10*3/uL (ref 3.8–10.8)

## 2022-02-09 LAB — C-REACTIVE PROTEIN: CRP: 1.6 mg/L (ref <8.0)

## 2022-02-09 LAB — SEDIMENTATION RATE: Sed Rate: 22 mm/h — ABNORMAL HIGH (ref 0–15)

## 2022-02-09 MED ORDER — TRUE METRIX BLOOD GLUCOSE TEST VI STRP
1.0000 | ORAL_STRIP | Freq: Three times a day (TID) | 12 refills | Status: DC
  Filled 2022-02-09: qty 100, 30d supply, fill #0

## 2022-02-09 MED ORDER — NOVOLOG FLEXPEN 100 UNIT/ML ~~LOC~~ SOPN: 10.0000 [IU] | PEN_INJECTOR | Freq: Three times a day (TID) | SUBCUTANEOUS | Status: DC

## 2022-02-09 MED ORDER — OZEMPIC (0.25 OR 0.5 MG/DOSE) 2 MG/1.5ML ~~LOC~~ SOPN
0.2500 mg | PEN_INJECTOR | SUBCUTANEOUS | 2 refills | Status: AC
  Filled 2022-02-09: qty 1.5, 56d supply, fill #0

## 2022-02-09 MED ORDER — INSULIN GLARGINE SOLOSTAR 100 UNIT/ML ~~LOC~~ SOPN: 28.0000 [IU] | PEN_INJECTOR | Freq: Every day | SUBCUTANEOUS | 11 refills | Status: DC

## 2022-02-09 MED ORDER — BUPRENORPHINE HCL-NALOXONE HCL 8-2 MG SL SUBL
1.0000 | SUBLINGUAL_TABLET | Freq: Three times a day (TID) | SUBLINGUAL | 0 refills | Status: DC
Start: 2022-02-09 — End: 2022-03-09
  Filled 2022-02-09: qty 90, 30d supply, fill #0
  Filled 2022-02-09: qty 42, 14d supply, fill #0
  Filled 2022-02-19 – 2022-02-20 (×3): qty 42, 14d supply, fill #1
  Filled 2022-03-09: qty 42, 14d supply, fill #2

## 2022-02-09 NOTE — Assessment & Plan Note (Signed)
Patient was hospitalized in March for right foot infection with abscess and osteomyelitis.  Status post right foot I&D on 3/28.  His wound culture grew Streptococcus agalactiae. ? ?He was following infectious disease and was given 1 shot of oritavancin.  He finished 7 days of metronidazole and continue Augmentin for 6 weeks total.  He last saw ID yesterday.  CBC showed resolution of white count.  Sed rate and CRP also trending down suggesting resolution of infection. ? ?His wound appears dried. ? ?-Continue Augmentin ?-Patient will follow up with orthopedic for wound care and suture removal ?

## 2022-02-09 NOTE — Assessment & Plan Note (Addendum)
Patient was prescribed 30-day supply of Suboxone after hospitalization.  Reported good pain control with Suboxone.  Denies additional opioid product use or cocaine use.  He ran out of Suboxone the last 2 days. ? ?His last talk screen on 11/2021 appropriate for buprenorphine and positive for cocaine. ? ?-Refill 30 day supply Suboxone 8-2 mg tablets TID ?-Follow-up in 1 month ?-Tox assure at next visit ?

## 2022-02-09 NOTE — Assessment & Plan Note (Addendum)
A1c 11.2 in March.  He is currently doing 28 units of Lantus in the morning and NovoLog 10 units with meals.  Patient however injecting NovoLog after meals.  He has not been checking his blood sugar because running out of test strips.  There was a question of whether his diabetes is time 1 or type II.  Given the early onset with normal BMI, there is suspicion of autoimmune related.  He cannot afford the antibody testing without insurance.  Patient also does not have orange card or CAFA letter.  He is not a candidate for SGLT2 given history of DKA.  He could not tolerate metformin in the past.  Even though we do not know the type of his diabetes, will trial a GLP-1 to see if it will help.  -Continue 28 units of Lantus in the a.m. -Advised patient to give NovoLog before meals. -Start Victoza at 0.25 mg weekly -Refill test strips.  Patient will bring his glucometer to next visit -Encourage patient to fill out financial assistance program form -Follow-up in 4 weeks

## 2022-02-09 NOTE — Patient Instructions (Addendum)
[name], ? ?It was a pleasure seeing you in the clinic today.  ? ?I will refill 30 days supply of your Suboxone.  ? ?2. Diabetes: Please continue 28 units of Lantus and 10 units of Novolog. Please check you blood sugar 30 minutes before meals, give Novolog, then eat 30 minutes after. Do no give novolog if you skip a meal. ? ?I send a prescription of Ozempic to your pharmacy. Please inject 0.25 mg weekly.  ? ?Please follow up in 1 month ? ?Take care,  ? ?Dr. Cyndie Chime  ?

## 2022-02-09 NOTE — Progress Notes (Signed)
? ?  CC: Hospital follow-up ? ?HPI: ? ?[name] is a 31 y.o. with past medical history of hypertension, uncontrolled diabetes, who presents to the clinic for hospital follow-up. ? ?Please see problem based charting for detail ? ?Past Medical History:  ?Diagnosis Date  ? Asthma   ? HTN (hypertension) 07/25/2017  ? Lisinopril 40mg  Daily  ? Hyperglycemia 09/24/2017  ? Type II diabetes mellitus (HCC)   ? "dx'd 06/2017"  ? ?Review of Systems:  per HPI ? ?Physical Exam: ? ?Vitals:  ? 02/09/22 1016  ?BP: (!) 146/93  ?Pulse: 64  ?Temp: 98.1 ?F (36.7 ?C)  ?TempSrc: Oral  ?SpO2: 100%  ?Weight: 184 lb 9.6 oz (83.7 kg)  ?Height: 5\' 6"  (1.676 m)  ? ?Physical Exam ?Constitutional:   ?   General: He is not in acute distress. ?   Appearance: He is not ill-appearing.  ?HENT:  ?   Head: Normocephalic.  ?Eyes:  ?   General:     ?   Right eye: No discharge.     ?   Left eye: No discharge.  ?   Conjunctiva/sclera: Conjunctivae normal.  ?Cardiovascular:  ?   Rate and Rhythm: Normal rate and regular rhythm.  ?Pulmonary:  ?   Effort: Pulmonary effort is normal. No respiratory distress.  ?   Breath sounds: Normal breath sounds.  ?Musculoskeletal:  ?   Comments: Trace edema of right lower extremity.  See picture for wound  ?Neurological:  ?   General: No focal deficit present.  ?   Mental Status: He is alert.  ?Psychiatric:     ?   Mood and Affect: Mood normal.  ?  ? ? ?Assessment & Plan:  ? ?See Encounters Tab for problem based charting. ? ?Patient discussed with Dr. 02/11/22  ?

## 2022-02-16 ENCOUNTER — Inpatient Hospital Stay: Payer: Self-pay | Admitting: Physician Assistant

## 2022-02-16 DIAGNOSIS — E109 Type 1 diabetes mellitus without complications: Secondary | ICD-10-CM

## 2022-02-20 ENCOUNTER — Other Ambulatory Visit (HOSPITAL_COMMUNITY): Payer: Self-pay

## 2022-02-21 ENCOUNTER — Other Ambulatory Visit (HOSPITAL_COMMUNITY): Payer: Self-pay

## 2022-02-21 NOTE — Progress Notes (Signed)
Internal Medicine Clinic Attending  Case discussed with Dr. Nguyen  At the time of the visit.  We reviewed the resident's history and exam and pertinent patient test results.  I agree with the assessment, diagnosis, and plan of care documented in the resident's note. 

## 2022-02-22 ENCOUNTER — Ambulatory Visit: Payer: Self-pay | Admitting: Infectious Diseases

## 2022-02-23 DIAGNOSIS — M79671 Pain in right foot: Secondary | ICD-10-CM | POA: Insufficient documentation

## 2022-02-24 ENCOUNTER — Ambulatory Visit: Payer: Self-pay | Admitting: Infectious Diseases

## 2022-03-09 ENCOUNTER — Other Ambulatory Visit: Payer: Self-pay | Admitting: Student

## 2022-03-09 ENCOUNTER — Encounter (HOSPITAL_BASED_OUTPATIENT_CLINIC_OR_DEPARTMENT_OTHER): Payer: Self-pay | Admitting: General Surgery

## 2022-03-09 ENCOUNTER — Other Ambulatory Visit (HOSPITAL_COMMUNITY): Payer: Self-pay

## 2022-03-09 DIAGNOSIS — F1111 Opioid abuse, in remission: Secondary | ICD-10-CM

## 2022-03-09 MED ORDER — BUPRENORPHINE HCL-NALOXONE HCL 8-2 MG SL SUBL
1.0000 | SUBLINGUAL_TABLET | Freq: Three times a day (TID) | SUBLINGUAL | 0 refills | Status: DC
  Filled 2022-03-09: qty 42, 14d supply, fill #0

## 2022-03-09 NOTE — Telephone Encounter (Signed)
Appointment has been scheduled for 03/16/2022 at 3:45 pm with Dr. Doran Stabler.  Patient sent message via my chart to make him aware. ?

## 2022-03-14 ENCOUNTER — Encounter (HOSPITAL_BASED_OUTPATIENT_CLINIC_OR_DEPARTMENT_OTHER): Payer: Commercial Managed Care - HMO | Attending: General Surgery | Admitting: General Surgery

## 2022-03-14 DIAGNOSIS — M86171 Other acute osteomyelitis, right ankle and foot: Secondary | ICD-10-CM | POA: Insufficient documentation

## 2022-03-14 DIAGNOSIS — L97514 Non-pressure chronic ulcer of other part of right foot with necrosis of bone: Secondary | ICD-10-CM | POA: Insufficient documentation

## 2022-03-14 DIAGNOSIS — E1140 Type 2 diabetes mellitus with diabetic neuropathy, unspecified: Secondary | ICD-10-CM | POA: Insufficient documentation

## 2022-03-14 DIAGNOSIS — E11628 Type 2 diabetes mellitus with other skin complications: Secondary | ICD-10-CM | POA: Diagnosis not present

## 2022-03-14 DIAGNOSIS — E1169 Type 2 diabetes mellitus with other specified complication: Secondary | ICD-10-CM | POA: Diagnosis not present

## 2022-03-14 DIAGNOSIS — E11621 Type 2 diabetes mellitus with foot ulcer: Secondary | ICD-10-CM | POA: Diagnosis present

## 2022-03-14 NOTE — Progress Notes (Signed)
Farley, Roger ([id number]) ?Visit Report for 03/14/2022 ?Chief Complaint Document Details ?Patient Name: Date of Service: ?[name], [name] 03/14/2022 9:00 A M ?Medical Record Number: [id number] ?Patient Account Number: [id number] ?Date of Birth/Sex: Treating RN: ?1991-03-20 (31 y.o. M) ?Primary Care Provider: Gaylan Gerold Other Clinician: ?Referring Provider: ?Treating Provider/Extender: Fredirick Maudlin ?Gaylan Gerold ?Weeks in Treatment: 0 ?Information Obtained from: Patient ?Chief Complaint ?Patients presents for treatment of an open diabetic ulcer on his right fifth metatarsal head. ?Electronic Signature(s) ?Signed: 03/14/2022 12:43:13 PM By: Fredirick Maudlin MD FACS ?Entered By: Fredirick Maudlin on 03/14/2022 12:43:12 ?-------------------------------------------------------------------------------- ?Debridement Details ?Patient Name: Date of Service: ?[name], [name] 03/14/2022 9:00 A M ?Medical Record Number: [id number] ?Patient Account Number: [id number] ?Date of Birth/Sex: Treating RN: ?19-Aug-1991 (31 y.o. Mare Ferrari ?Primary Care Provider: Gaylan Gerold Other Clinician: ?Referring Provider: ?Treating Provider/Extender: Fredirick Maudlin ?Gaylan Gerold ?Weeks in Treatment: 0 ?Debridement Performed for Assessment: Wound #1 Right,Dorsal Foot ?Performed By: Physician Fredirick Maudlin, MD ?Debridement Type: Debridement ?Severity of Tissue Pre Debridement: Fat layer exposed ?Level of Consciousness (Pre-procedure): Awake and Alert ?Pre-procedure Verification/Time Out Yes - 09:53 ?Taken: ?Start Time: 09:53 ?Pain Control: ?Other : Benzocaine 20% spray ?T Area Debrided (L x W): ?otal 1.6 (cm) x 1.3 (cm) = 2.08 (cm?) ?Tissue and other material debrided: ?Slough, Subcutaneous, Skin: Epidermis, Slough ?Level: Skin/Subcutaneous Tissue ?Debridement Description: Excisional ?Instrument: Curette ?Bleeding: Minimum ?Hemostasis Achieved: Pressure ?Procedural Pain: 2 ?Post Procedural Pain: 1 ?Response to Treatment:  Procedure was tolerated well ?Level of Consciousness (Post- Awake and Alert ?procedure): ?Post Debridement Measurements of Total Wound ?Length: (cm) 1.6 ?Width: (cm) 1.3 ?Depth: (cm) 0.4 ?Volume: (cm?) 0.653 ?Character of Wound/Ulcer Post Debridement: Improved ?Severity of Tissue Post Debridement: Fat layer exposed ?Post Procedure Diagnosis ?Same as Pre-procedure ?Electronic Signature(s) ?Signed: 03/14/2022 12:57:52 PM By: Fredirick Maudlin MD FACS ?Signed: 03/14/2022 4:04:33 PM By: Sharyn Creamer RN, BSN ?Entered By: Sharyn Creamer on 03/14/2022 09:57:33 ?-------------------------------------------------------------------------------- ?HPI Details ?Patient Name: Date of Service: ?[name], [name] 03/14/2022 9:00 A M ?Medical Record Number: [id number] ?Patient Account Number: [id number] ?Date of Birth/Sex: Treating RN: ?1991/05/26 (31 y.o. M) ?Primary Care Provider: Gaylan Gerold Other Clinician: ?Referring Provider: ?Treating Provider/Extender: Fredirick Maudlin ?Gaylan Gerold ?Weeks in Treatment: 0 ?History of Present Illness ?HPI Description: ADMISSION ?03/14/2022 ?This is a 31 year old poorly controlled diabetic (last A1c 11.2%). He says the wound opened on his right foot in March. He initially presented to an emergency ?department on the 25th, but apparently the wait time was long and he elected to leave prior to evaluation. The symptoms got worse and he went to an urgent ?care facility on March 28. Based upon the evaluation there, he was sent to the hospital emergency room. He was admitted for IV antibiotics. An MRI was ?performed that was suggestive of osteomyelitis. He underwent incision and drainage of the wound and loose primary closure. He was discharged from the ?hospital on March 31. He was prescribed a 4-week course of oral Augmentin. He saw infectious disease on April 12. Apparently at that visit the wound appeared ?to have deteriorated. He saw orthopedic surgery on 27 April. The wound was opened and  debrided. He was referred to the wound care center for further ?evaluation and management. He says that he has been cleaning the wound with peroxide at home. ?Today, the wound actually looks quite good, compared to the photos in the electronic medical record. There is some senescent skin and eschar piled up around ?the margins. Tendon is exposed at the medial aspect of the wound but  there is some granulation tissue visible. No odor or purulent drainage. ?Electronic Signature(s) ?Signed: 03/14/2022 12:54:05 PM By: Fredirick Maudlin MD FACS ?Entered By: Fredirick Maudlin on 03/14/2022 12:54:05 ?-------------------------------------------------------------------------------- ?Physical Exam Details ?Patient Name: Date of Service: ?[name], [name] 03/14/2022 9:00 A M ?Medical Record Number: [id number] ?Patient Account Number: [id number] ?Date of Birth/Sex: Treating RN: ?July 27, 1991 (31 y.o. M) ?Primary Care Provider: Gaylan Gerold Other Clinician: ?Referring Provider: ?Treating Provider/Extender: Fredirick Maudlin ?Gaylan Gerold ?Weeks in Treatment: 0 ?Constitutional ?. . . . No acute distress. ?Respiratory ?Normal work of breathing on room air.Marland Kitchen ?Cardiovascular ?Marland Kitchen ?Notes ?03/14/2022: He has a wound on the dorsal surface of his right fifth metatarsal head. There is some senescent skin and eschar piled up around the margins. ?Tendon is exposed at the medial aspect of the wound but there is some granulation tissue visible. No odor or purulent drainage. He also has a very thick callus ?on the plantar surface of his right fifth metatarsal head. There does not appear to be any opening underneath the callus at this time. ?Electronic Signature(s) ?Signed: 03/14/2022 12:55:11 PM By: Fredirick Maudlin MD FACS ?Entered By: Fredirick Maudlin on 03/14/2022 12:55:11 ?-------------------------------------------------------------------------------- ?Physician Orders Details ?Patient Name: ?Date of Service: ?[name], [name] 03/14/2022 9:00 A  M ?Medical Record Number: [id number] ?Patient Account Number: [id number] ?Date of Birth/Sex: ?Treating RN: ?09-17-1991 (31 y.o. Mare Ferrari ?Primary Care Provider: Gaylan Gerold ?Other Clinician: ?Referring Provider: ?Treating Provider/Extender: Fredirick Maudlin ?Gaylan Gerold ?Weeks in Treatment: 0 ?Verbal / Phone Orders: No ?Diagnosis Coding ?ICD-10 Coding ?Code Description ?L97.514 Non-pressure chronic ulcer of other part of right foot with necrosis of bone ?E11.8 Type 2 diabetes mellitus with unspecified complications ?M86.171 Other acute osteomyelitis, right ankle and foot ?E11.628 Type 2 diabetes mellitus with other skin complications ?Follow-up Appointments ?ppointment in 1 week. - Dr. Celine Ahr - room 1 on 5/23 @ 10:30 ?Return A ?Bathing/ Shower/ Hygiene ?May shower and wash wound with soap and water. - use liquid dial antibiotic soap, dry well ?Edema Control - Lymphedema / SCD / Other ?Elevate legs to the level of the heart or above for 30 minutes daily and/or when sitting, a frequency of: - when sitting ?Exercise regularly ?Moisturize legs daily. ?Wound Treatment ?Wound #1 - Foot Wound Laterality: Dorsal, Right ?Cleanser: Soap and Water 1 x Per Day/30 Days ?Discharge Instructions: May shower and wash wound with dial antibacterial soap and water prior to dressing change. ?Cleanser: Byram Ancillary Kit - 15 Day Supply (DME) (Generic) 1 x Per Day/30 Days ?Discharge Instructions: Use supplies as instructed; Kit contains: (15) Saline Bullets; (15) 3x3 Gauze; 15 pr Gloves ?Prim Dressing: KerraCel Ag Gelling Fiber Dressing, 2x2 in (silver alginate) (DME) (Generic) 1 x Per Day/30 Days ?ary ?Discharge Instructions: Apply silver alginate to wound bed as instructed ?Secondary Dressing: Woven Gauze Sponge, Non-Sterile 4x4 in (DME) (Generic) 1 x Per Day/30 Days ?Discharge Instructions: Apply over primary dressing as directed. ?Secured With: Elastic Bandage 4 inch (ACE bandage) 1 x Per Day/30 Days ?Discharge Instructions:  Secure with ACE bandage as directed. ?Secured With: [employer], 4.5x3.1 (in/yd) 1 x Per Day/30 Days ?Discharge Instructions: Secure with Kerlix as directed. ?Secured With: 19M Medipore [employer] operational [employer] Surgical T

## 2022-03-14 NOTE — Progress Notes (Signed)
Farley, Roger ([id number]) ?Visit Report for 03/14/2022 ?Abuse Risk Screen Details ?Patient Name: Date of Service: ?[name], [name] 03/14/2022 9:00 A M ?Medical Record Number: [id number] ?Patient Account Number: [id number] ?Date of Birth/Sex: Treating RN: ?[birth year] (31 y.o. [name] ?Primary Care Kore Madlock: Doran Stabler Other Clinician: ?Referring Helem Reesor: ?Treating Jamarius Saha/Extender: Duanne Guess ?Doran Stabler ?Weeks in Treatment: 0 ?Abuse Risk Screen Items ?Answer ?ABUSE RISK SCREEN: ?Has anyone close to you tried to hurt or harm you recentlyo No ?Do you feel uncomfortable with anyone in your familyo No ?Has anyone forced you do things that you didnt want to doo No ?Electronic Signature(s) ?Signed: 03/14/2022 4:04:33 PM By: Redmond Pulling RN, BSN ?Entered By: Redmond Pulling on 03/14/2022 09:40:24 ?-------------------------------------------------------------------------------- ?Activities of Daily Living Details ?Patient Name: Date of Service: ?[name], [name] 03/14/2022 9:00 A M ?Medical Record Number: [id number] ?Patient Account Number: [id number] ?Date of Birth/Sex: Treating RN: ?[birth year] (31 y.o. [name] ?Primary Care Gevin Perea: Doran Stabler Other Clinician: ?Referring Dailee Manalang: ?Treating Eboney Claybrook/Extender: Duanne Guess ?Doran Stabler ?Weeks in Treatment: 0 ?Activities of Daily Living Items ?Answer ?Activities of Daily Living (Please select one for each item) ?Drive Automobile Completely Able ?T Medications ?ake Completely Able ?Use T elephone Completely Able ?Care for Appearance Completely Able ?Use T oilet Completely Able ?Bath / Shower Completely Able ?Dress Self Completely Able ?Feed Self Completely Able ?Walk Completely Able ?Get In / Out Bed Completely Able ?Housework Completely Able ?Prepare Meals Completely Able ?Handle Money Completely Able ?Shop for Self Completely Able ?Electronic Signature(s) ?Signed: 03/14/2022 4:04:33 PM By: Redmond Pulling RN, BSN ?Entered By:  Redmond Pulling on 03/14/2022 09:40:53 ?-------------------------------------------------------------------------------- ?Education Screening Details ?Patient Name: ?Date of Service: ?[name], [name] 03/14/2022 9:00 A M ?Medical Record Number: [id number] ?Patient Account Number: [id number] ?Date of Birth/Sex: ?Treating RN: ?12/23/1990 (31 y.o. [name] ?Primary Care Bunny Kleist: Doran Stabler ?Other Clinician: ?Referring Tashana Haberl: ?Treating Latonja Bobeck/Extender: Duanne Guess ?Doran Stabler ?Weeks in Treatment: 0 ?Learning Preferences/Education Level/Primary Language ?Learning Preference: Explanation, Demonstration, Printed Material ?Highest Education Level: High School ?Preferred Language: English ?Cognitive Barrier ?Language Barrier: No ?Translator Needed: No ?Memory Deficit: No ?Emotional Barrier: No ?Cultural/Religious Beliefs Affecting Medical Care: No ?Physical Barrier ?Impaired Vision: No ?Impaired Hearing: No ?Decreased Hand dexterity: No ?Knowledge/Comprehension ?Knowledge Level: High ?Comprehension Level: High ?Ability to understand written instructions: High ?Ability to understand verbal instructions: High ?Motivation ?Anxiety Level: Calm ?Cooperation: Cooperative ?Education Importance: Acknowledges Need ?Interest in Health Problems: Asks Questions ?Perception: Coherent ?Willingness to Engage in Self-Management High ?Activities: ?Readiness to Engage in Self-Management High ?Activities: ?Electronic Signature(s) ?Signed: 03/14/2022 4:04:33 PM By: Redmond Pulling RN, BSN ?Entered By: Redmond Pulling on 03/14/2022 09:41:26 ?-------------------------------------------------------------------------------- ?Fall Risk Assessment Details ?Patient Name: ?Date of Service: ?[name], [name] 03/14/2022 9:00 A M ?Medical Record Number: [id number] ?Patient Account Number: [id number] ?Date of Birth/Sex: ?Treating RN: ?05/11/1991 (31 y.o. [name] ?Primary Care Yohan Samons: Doran Stabler ?Other Clinician: ?Referring  Jilberto Vanderwall: ?Treating Nijel Flink/Extender: Duanne Guess ?Doran Stabler ?Weeks in Treatment: 0 ?Fall Risk Assessment Items ?Have you had 2 or more falls in the last 12 monthso 0 No ?Have you had any fall that resulted in injury in the last 12 monthso 0 No ?FALLS RISK SCREEN ?History of falling - immediate or within 3 months 0 No ?Secondary diagnosis (Do you have 2 or more medical diagnoseso) 0 No ?Ambulatory aid ?None/bed rest/wheelchair/nurse 0 No ?Crutches/cane/walker 0 No ?Furniture 0 No ?Intravenous therapy Access/Saline/Heparin Lock 0 No ?Gait/Transferring ?Normal/ bed rest/ wheelchair 0 No ?Weak (short steps with or without shuffle, stooped but  able to lift head while walking, may seek 0 No ?support from furniture) ?Impaired (short steps with shuffle, may have difficulty arising from chair, head down, impaired 0 No ?balance) ?Mental Status ?Oriented to own ability 0 No ?Electronic Signature(s) ?Signed: 03/14/2022 4:04:33 PM By: Redmond Pulling RN, BSN ?Entered By: Redmond Pulling on 03/14/2022 09:41:40 ?-------------------------------------------------------------------------------- ?Foot Assessment Details ?Patient Name: ?Date of Service: ?[name], [name] 03/14/2022 9:00 A M ?Medical Record Number: [id number] ?Patient Account Number: [id number] ?Date of Birth/Sex: ?Treating RN: ?[birth year] (31 y.o. [name] ?Primary Care Lashaya Kienitz: Doran Stabler ?Other Clinician: ?Referring Zorian Gunderman: ?Treating Shauntavia Brackin/Extender: Duanne Guess ?Doran Stabler ?Weeks in Treatment: 0 ?Foot Assessment Items ?Site Locations ?+ = Sensation present, - = Sensation absent, C = Callus, U = Ulcer ?R = Redness, W = Warmth, M = Maceration, PU = Pre-ulcerative lesion ?F = Fissure, S = Swelling, D = Dryness ?Assessment ?Right: Left: ?Other Deformity: No No ?Prior Foot Ulcer: No No ?Prior Amputation: No No ?Charcot Joint: No No ?Ambulatory Status: ?Gait: ?Electronic Signature(s) ?Signed: 03/14/2022 4:04:33 PM By: Redmond Pulling RN,  BSN ?Entered By: Redmond Pulling on 03/14/2022 09:43:14 ?-------------------------------------------------------------------------------- ?Nutrition Risk Screening Details ?Patient Name: ?Date of Service: ?Wexler, [name] 03/14/2022 9:00 A M ?Medical Record Number: [id number] ?Patient Account Number: [id number] ?Date of Birth/Sex: ?Treating RN: ?January 28, 1991 (31 y.o. [name] ?Primary Care Jadaya Sommerfield: Doran Stabler ?Other Clinician: ?Referring Tatyana Biber: ?Treating Nelly Scriven/Extender: Duanne Guess ?Doran Stabler ?Weeks in Treatment: 0 ?Height (in): 66 ?Weight (lbs): 180 ?Body Mass Index (BMI): 29 ?Nutrition Risk Screening Items ?Score Screening ?NUTRITION RISK SCREEN: ?I have an illness or condition that made me change the kind and/or amount of food I eat 0 No ?I eat fewer than two meals per day 0 No ?I eat few fruits and vegetables, or milk products 0 No ?I have three or more drinks of beer, liquor or wine almost every day 0 No ?I have tooth or mouth problems that make it hard for me to eat 0 No ?I don't always have enough money to buy the food I need 0 No ?I eat alone most of the time 0 No ?I take three or more different prescribed or over-the-counter drugs a day 0 No ?Without wanting to, I have lost or gained 10 pounds in the last six months 0 No ?I am not always physically able to shop, cook and/or feed myself 0 No ?Nutrition Protocols ?Good Risk Protocol ?Moderate Risk Protocol ?High Risk Proctocol ?Risk Level: Good Risk ?Score: 0 ?Electronic Signature(s) ?Signed: 03/14/2022 4:04:33 PM By: Redmond Pulling RN, BSN ?Entered By: Redmond Pulling on 03/14/2022 09:41:51 ?

## 2022-03-14 NOTE — Progress Notes (Signed)
Neth, Rodman ([id number]) ?Visit Report for 03/14/2022 ?Allergy List Details ?Patient Name: Date of Service: ?Lotspeich, Carolynn Sayers RY 03/14/2022 9:00 A M ?Medical Record Number: [id number] ?Patient Account Number: [id number] ?Date of Birth/Sex: Treating RN: ?Aug 19, 1991 (31 y.o. Cline Cools ?Primary Care Goerge Mohr: Doran Stabler Other Clinician: ?Referring Jurline Folger: ?Treating Alaysiah Browder/Extender: Duanne Guess ?Doran Stabler ?Weeks in Treatment: 0 ?Allergies ?Active Allergies ?No Known Drug Allergies ?Allergy Notes ?Electronic Signature(s) ?Signed: 03/14/2022 4:04:33 PM By: Redmond Pulling RN, BSN ?Entered By: Redmond Pulling on 03/14/2022 09:22:43 ?-------------------------------------------------------------------------------- ?Arrival Information Details ?Patient Name: Date of Service: ?Stotler, Carolynn Sayers RY 03/14/2022 9:00 A M ?Medical Record Number: [id number] ?Patient Account Number: [id number] ?Date of Birth/Sex: Treating RN: ?01/23/1991 (31 y.o. Cline Cools ?Primary Care Rozalia Dino: Doran Stabler Other Clinician: ?Referring Vikrant Pryce: ?Treating Ido Wollman/Extender: Duanne Guess ?Doran Stabler ?Weeks in Treatment: 0 ?Visit Information ?Patient Arrived: Ambulatory ?Arrival Time: 09:07 ?Accompanied By: self ?Transfer Assistance: None ?Patient Identification Verified: Yes ?Secondary Verification Process Completed: Yes ?Patient Requires Transmission-Based Precautions: No ?Patient Has Alerts: Yes ?Patient Alerts: ?ABI: R 1.09, L 1.02 ?TBI: R 0.70, L 0.72 ?Electronic Signature(s) ?Signed: 03/14/2022 4:04:33 PM By: Redmond Pulling RN, BSN ?Entered By: Redmond Pulling on 03/14/2022 12:03:11 ?-------------------------------------------------------------------------------- ?Clinic Level of Care Assessment Details ?Patient Name: Date of Service: ?Jernberg, Carolynn Sayers RY 03/14/2022 9:00 A M ?Medical Record Number: [id number] ?Patient Account Number: [id number] ?Date of Birth/Sex: Treating RN: ?1991-09-25 (31 y.o. Cline Cools ?Primary Care Sammy Douthitt: Doran Stabler Other Clinician: ?Referring Ramani Riva: ?Treating Tauheed Mcfayden/Extender: Duanne Guess ?Doran Stabler ?Weeks in Treatment: 0 ?Clinic Level of Care Assessment Items ?TOOL 1 Quantity Score ?X- 1 0 ?Use when EandM and Procedure is performed on INITIAL visit ?ASSESSMENTS - Nursing Assessment / Reassessment ?X- 1 20 ?General Physical Exam (combine w/ comprehensive assessment (listed just below) when performed on new pt. evals) ?X- 1 25 ?Comprehensive Assessment (HX, ROS, Risk Assessments, Wounds Hx, etc.) ?ASSESSMENTS - Wound and Skin Assessment / Reassessment ?X- 1 10 ?Dermatologic / Skin Assessment (not related to wound area) ?ASSESSMENTS - Ostomy and/or Continence Assessment and Care ?[]  - 0 ?Incontinence Assessment and Management ?[]  - 0 ?Ostomy Care Assessment and Management (repouching, etc.) ?PROCESS - Coordination of Care ?X - Simple Patient / Family Education for ongoing care 1 15 ?[]  - 0 ?Complex (extensive) Patient / Family Education for ongoing care ?X- 1 10 ?Staff obtains Consents, Records, T Results / Process Orders ?est ?[]  - 0 ?Staff telephones HHA, Nursing Homes / Clarify orders / etc ?[]  - 0 ?Routine Transfer to another Facility (non-emergent condition) ?[]  - 0 ?Routine Hospital Admission (non-emergent condition) ?[]  - 0 ?New Admissions / / Ordering NPWT Apligraf, etc. ?, ?[]  - 0 ?Emergency Hospital Admission (emergent condition) ?PROCESS - Special Needs ?[]  - 0 ?Pediatric / Minor Patient Management ?[]  - 0 ?Isolation Patient Management ?[]  - 0 ?Hearing / Language / Visual special needs ?[]  - 0 ?Assessment of Community assistance (transportation, D/C planning, etc.) ?[]  - 0 ?Additional assistance / Altered mentation ?[]  - 0 ?Support Surface(s) Assessment (bed, cushion, seat, etc.) ?INTERVENTIONS - Miscellaneous ?[]  - 0 ?External ear exam ?[]  - 0 ?Patient Transfer (multiple staff / / Similar devices) ?[]  - 0 ?Simple Staple /  Suture removal (25 or less) ?[]  - 0 ?Complex Staple / Suture removal (26 or more) ?[]  - 0 ?Hypo/Hyperglycemic Management (do not check if billed separately) ?[]  - 0 ?Ankle / Brachial Index (ABI) - do not check if billed separately ?Has the patient been seen at the hospital within the last  three years: Yes ?Total Score: 80 ?Level Of Care: New/Established - Level 3 ?Electronic Signature(s) ?Signed: 03/14/2022 4:04:33 PM By: Redmond Pulling RN, BSN ?Entered By: Redmond Pulling on 03/14/2022 11:55:27 ?-------------------------------------------------------------------------------- ?Encounter Discharge Information Details ?Patient Name: ?Date of Service: ?Brouwer, Carolynn Sayers RY 03/14/2022 9:00 A M ?Medical Record Number: [id number] ?Patient Account Number: [id number] ?Date of Birth/Sex: ?Treating RN: ?[birth year] (31 y.o. Cline Cools ?Primary Care Reymundo Winship: Doran Stabler ?Other Clinician: ?Referring Analayah Brooke: ?Treating Lena Gores/Extender: Duanne Guess ?Doran Stabler ?Weeks in Treatment: 0 ?Encounter Discharge Information Items Post Procedure Vitals ?Discharge Condition: Stable ?Temperature (F): 98.5 ?Ambulatory Status: Ambulatory ?Pulse (bpm): 99 ?Discharge Destination: Home ?Respiratory Rate (breaths/min): 18 ?Transportation: Private Auto ?Blood Pressure (mmHg): 135/57 ?Accompanied By: self ?Schedule Follow-up Appointment: Yes ?Clinical Summary of Care: Patient Declined ?Electronic Signature(s) ?Signed: 03/14/2022 4:04:33 PM By: Redmond Pulling RN, BSN ?Entered By: Redmond Pulling on 03/14/2022 11:57:52 ?-------------------------------------------------------------------------------- ?Lower Extremity Assessment Details ?Patient Name: ?Date of Service: ?Doo, Carolynn Sayers RY 03/14/2022 9:00 A M ?Medical Record Number: [id number] ?Patient Account Number: [id number] ?Date of Birth/Sex: ?Treating RN: ?May 03, 1991 (31 y.o. Cline Cools ?Primary Care Dameian Crisman: Doran Stabler ?Other Clinician: ?Referring Maddelynn Moosman: ?Treating  Farah Benish/Extender: Duanne Guess ?Doran Stabler ?Weeks in Treatment: 0 ?Edema Assessment ?Assessed: [Left: No] [Right: Yes] ?Edema: [Left: N] [Right: o] ?Calf ?Left: Right: ?Point of Measurement: 31 cm From Medial Instep 37 cm ?Ankle ?Left: Right: ?Point of Measurement: 10.5 cm From Medial Instep 23.5 cm ?Knee To Floor ?Left: Right: ?From Medial Instep 42 cm ?Vascular Assessment ?Pulses: ?Dorsalis Pedis ?Palpable: [Right:Yes] ?Electronic Signature(s) ?Signed: 03/14/2022 4:04:33 PM By: Redmond Pulling RN, BSN ?Entered By: Redmond Pulling on 03/14/2022 09:33:14 ?-------------------------------------------------------------------------------- ?Multi Wound Chart Details ?Patient Name: ?Date of Service: ?Poser, Carolynn Sayers RY 03/14/2022 9:00 A M ?Medical Record Number: [id number] ?Patient Account Number: [id number] ?Date of Birth/Sex: ?Treating RN: ?[birth year] (31 y.o. M) ?Primary Care Sheena Donegan: Doran Stabler ?Other Clinician: ?Referring Jekhi Bolin: ?Treating Eliz Nigg/Extender: Duanne Guess ?Doran Stabler ?Weeks in Treatment: 0 ?Vital Signs ?Height(in): 66 ?Pulse(bpm): 99 ?Weight(lbs): 180 ?Blood Pressure(mmHg): 135/57 ?Body Mass Index(BMI): 29 ?Temperature(??F): 98.5 ?Respiratory Rate(breaths/min): 18 ?Photos: [N/A:N/A] ?Right, Dorsal Foot N/A N/A ?Wound Location: ?Blister N/A N/A ?Wounding Event: ?Diabetic Wound/Ulcer of the Lower N/A N/A ?Primary Etiology: ?Extremity ?Type II Diabetes, Neuropathy N/A N/A ?Comorbid History: ?01/21/2022 N/A N/A ?Date Acquired: ?0 N/A N/A ?Weeks of Treatment: ?Open N/A N/A ?Wound Status: ?No N/A N/A ?Wound Recurrence: ?1.6x1.3x0.4 N/A N/A ?Measurements L x W x D (cm) ?1.634 N/A N/A ?A (cm?) : ?rea ?0.653 N/A N/A ?Volume (cm?) : ?0.00% N/A N/A ?% Reduction in A rea: ?0.00% N/A N/A ?% Reduction in Volume: ?Grade 3 N/A N/A ?Classification: ?Medium N/A N/A ?Exudate A mount: ?Serosanguineous N/A N/A ?Exudate Type: ?red, brown N/A N/A ?Exudate Color: ?Thickened N/A N/A ?Wound Margin: ?Large (67-100%)  N/A N/A ?Granulation A mount: ?Red N/A N/A ?Granulation Quality: ?Small (1-33%) N/A N/A ?Necrotic A mount: ?Fat Layer (Subcutaneous Tissue): Yes N/A N/A ?Exposed Structures: ?Tendon: Yes ?Fascia: No ?Muscle: No ?Joint: No ?Bone: No

## 2022-03-16 ENCOUNTER — Ambulatory Visit (INDEPENDENT_AMBULATORY_CARE_PROVIDER_SITE_OTHER): Payer: Commercial Managed Care - HMO | Admitting: Student

## 2022-03-16 ENCOUNTER — Other Ambulatory Visit (HOSPITAL_COMMUNITY): Payer: Self-pay

## 2022-03-16 ENCOUNTER — Other Ambulatory Visit: Payer: Self-pay

## 2022-03-16 ENCOUNTER — Encounter: Payer: Self-pay | Admitting: Student

## 2022-03-16 DIAGNOSIS — E1110 Type 2 diabetes mellitus with ketoacidosis without coma: Secondary | ICD-10-CM

## 2022-03-16 DIAGNOSIS — F1111 Opioid abuse, in remission: Secondary | ICD-10-CM

## 2022-03-16 DIAGNOSIS — E109 Type 1 diabetes mellitus without complications: Secondary | ICD-10-CM

## 2022-03-16 DIAGNOSIS — E11628 Type 2 diabetes mellitus with other skin complications: Secondary | ICD-10-CM | POA: Diagnosis not present

## 2022-03-16 DIAGNOSIS — L089 Local infection of the skin and subcutaneous tissue, unspecified: Secondary | ICD-10-CM | POA: Diagnosis not present

## 2022-03-16 DIAGNOSIS — Z794 Long term (current) use of insulin: Secondary | ICD-10-CM

## 2022-03-16 DIAGNOSIS — Z7985 Long-term (current) use of injectable non-insulin antidiabetic drugs: Secondary | ICD-10-CM

## 2022-03-16 MED ORDER — BASAGLAR KWIKPEN 100 UNIT/ML ~~LOC~~ SOPN
28.0000 [IU] | PEN_INJECTOR | Freq: Every day | SUBCUTANEOUS | 2 refills | Status: DC
  Filled 2022-03-16: qty 9, 32d supply, fill #0
  Filled 2022-03-16 – 2022-04-26 (×2): qty 15, 53d supply, fill #0
  Filled 2022-04-27: qty 6, 21d supply, fill #0

## 2022-03-16 MED ORDER — DEXCOM G7 SENSOR MISC
3 refills | Status: DC
  Filled 2022-03-16: qty 1, fill #0
  Filled 2022-03-17: qty 3, 30d supply, fill #0

## 2022-03-16 MED ORDER — DEXCOM G7 SENSOR MISC
1.0000 | 3 refills | Status: DC
  Filled 2022-03-16: qty 1, 3d supply, fill #0

## 2022-03-16 MED ORDER — GLUCOSE BLOOD VI STRP
1.0000 | ORAL_STRIP | Freq: Four times a day (QID) | 12 refills | Status: AC
  Filled 2022-03-16: qty 100, 25d supply, fill #0

## 2022-03-16 MED ORDER — BUPRENORPHINE HCL-NALOXONE HCL 8-2 MG SL SUBL
1.0000 | SUBLINGUAL_TABLET | Freq: Three times a day (TID) | SUBLINGUAL | 0 refills | Status: DC
  Filled 2022-03-24: qty 90, 30d supply, fill #0

## 2022-03-16 MED ORDER — HUMALOG KWIKPEN 100 UNIT/ML ~~LOC~~ SOPN
10.0000 [IU] | PEN_INJECTOR | Freq: Three times a day (TID) | SUBCUTANEOUS | 2 refills | Status: DC
  Filled 2022-03-16 (×2): qty 9, 30d supply, fill #0

## 2022-03-16 MED ORDER — UNIFINE PENTIPS 32G X 4 MM MISC
1.0000 | Freq: Four times a day (QID) | 5 refills | Status: DC
  Filled 2022-03-16: qty 100, 25d supply, fill #0

## 2022-03-16 MED ORDER — DEXCOM G7 SENSOR MISC
1.0000 | 3 refills | Status: DC
  Filled 2022-03-16: qty 1, fill #0

## 2022-03-16 NOTE — Addendum Note (Signed)
Addended byDoran Stabler on: 03/16/2022 05:37 PM   Modules accepted: Orders

## 2022-03-16 NOTE — Assessment & Plan Note (Addendum)
Reports doing well on Suboxone 3 times daily.  Denies any other opioid product use or craving.  Denies any side effects from Suboxone.  His last tox assure February was positive for cocaine.  -Refill Suboxone 8-2 mg SL tablets TID for 30-day supply -Tox assure today -Follow-up in 1 month.  If he continues to do well, can space out the follow-up interval

## 2022-03-16 NOTE — Progress Notes (Signed)
CC: OUD  HPI:  [name] is a 31 y.o. 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  Please see problem based charting for detail  Past Medical History:  Diagnosis Date   Asthma    HTN (hypertension) 07/25/2017   Lisinopril 40mg  Daily   Hyperglycemia 09/24/2017   Type II diabetes mellitus ([place])    "dx'd 06/2017"   Review of Systems:  per HPI  Physical Exam:  Vitals:   03/16/22 1544  BP: 132/74  Pulse: 95  Temp: 98.6 F (37 C)  TempSrc: Oral  SpO2: 100%  Weight: 188 lb 12.8 oz (85.6 kg)  Height: 5\' 6"  (1.676 m)   Physical Exam Constitutional:      General: He is not in acute distress.    Appearance: He is not ill-appearing.  HENT:     Head: Normocephalic.  Eyes:     General:        Right eye: No discharge.        Left eye: No discharge.     Conjunctiva/sclera: Conjunctivae normal.  Cardiovascular:     Rate and Rhythm: Normal rate and regular rhythm.  Pulmonary:     Effort: Pulmonary effort is normal. No respiratory distress.     Breath sounds: Normal breath sounds.  Musculoskeletal:     Comments: Right foot wound appear much improved.  No drainage seen.  The tissue around the wound appears healthy and noninfected.  Right foot not edematous or erythematous.  Skin:    General: Skin is warm.  Neurological:     General: No focal deficit present.     Mental Status: He is alert.  Psychiatric:        Mood and Affect: Mood normal.     Assessment & Plan:   See Encounters Tab for problem based charting.  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  Current regimen: -Lantus 28 units daily -NovoLog 10 units before meals -Ozempic 0.25 mg weekly  Patient reports feeling much better and more energetic.  States that his neuropathy and vision has gotten better as well.  He reports adherence to his regimen and correct use of  his short acting insulin.  Said that his appetite has decreased after starting Ozempic.  Patient reports a possible hypoglycemic event last night.  Said that he experienced mild tremors and sweating, which improved after he ate a snack.  He did not check his blood sugar at that time.  He said he may have eaten a lot less the day prior.  Denies any prior hypoglycemic events.  Unfortunately patient has not been checking his blood sugar because his test strip and meter do not match.  There was a question whether he has type I or type 2 diabetes.  Patient used to be very overweight in the 300s pounds.  He has lost significant weights due to cutting back on eating.  Suspect his current diabetes is due to severe insulin resistance.  He has had multiple DKAs so SGLT2 is not indicated.  Patient did not tolerate metformin in the past.  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  No signs of active infection.  Patient has finished his course of Augmentin.  Sed rate and CRP trending down significantly.  He has been following with wound care weekly and is packing his wound at home daily.  Opioid use disorder, mild, in sustained remission, on maintenance therapy, abuse ([place]) Reports doing well on Suboxone 3 times daily.  Denies any other opioid product use or craving.  Denies any side effects from Suboxone.  His last tox assure February was positive for cocaine.  -Refill Suboxone 8-2 mg SL tablets TID for 30-day supply -Tox assure today -Follow-up in 1 month.  If he continues to do well, can space out the follow-up interval   Patient discussed with Dr.  Evette Doffing

## 2022-03-16 NOTE — Addendum Note (Signed)
Addended byDoran Stabler on: 03/16/2022 05:51 PM   Modules accepted: Orders

## 2022-03-16 NOTE — Assessment & Plan Note (Addendum)
A1c 11.2 in March.  Current regimen: -Lantus 28 units daily -NovoLog 10 units before meals -Ozempic 0.25 mg weekly  Patient reports feeling much better and more energetic.  States that his neuropathy and vision has gotten better as well.  He reports adherence to his regimen and correct use of his short acting insulin.  Said that his appetite has decreased after starting Ozempic.  Patient reports a possible hypoglycemic event last night.  Said that he experienced mild tremors and sweating, which improved after he ate a snack.  He did not check his blood sugar at that time.  He said he may have eaten a lot less the day prior.  Denies any prior hypoglycemic events.  Unfortunately patient has not been checking his blood sugar because his test strip and meter do not match.  There was a question whether he has type I or type 2 diabetes.  Patient used to be very overweight in the 300s pounds.  He has lost significant weights due to cutting back on eating.  Suspect his current diabetes is due to severe insulin resistance.  He has had multiple DKAs so SGLT2 is not indicated.  Patient did not tolerate metformin in the past.  Since he has new insurance, his insulin will be adjusted  -Basaglar 28 units daily -Humalog 10 units before meals -Continue Ozempic 0.25 mg weekly -Order One Touch test strips -Fortunately, his insurance cover Dexcom  -Ophthalmology referral -A1c in 1 month

## 2022-03-16 NOTE — Assessment & Plan Note (Signed)
Right foot wound appears much improved.  No signs of active infection.  Patient has finished his course of Augmentin.  Sed rate and CRP trending down significantly.  He has been following with wound care weekly and is packing his wound at home daily.

## 2022-03-16 NOTE — Patient Instructions (Addendum)
Mr. Fenzel,  It was a pleasure seeing you in the clinic today.  1.  I am glad your foot wound is doing much better.  Please continue to follow-up with the wound center.  2.  Diabetes: Because you have insurance, you insulin will be changed a little bit.  -Inject 28 units of Basaglar once a day -Inject 10 units of Humalog before meals.  Do not give Humalog if you skip a meal. -Continue Ozempic  3.  I sent a 30-day supply of Suboxone to the pharmacy.  Please return in 1 month for Suboxone and A1c to recheck.  Take care,  Dr. Alfonse Spruce

## 2022-03-17 ENCOUNTER — Other Ambulatory Visit (HOSPITAL_COMMUNITY): Payer: Self-pay

## 2022-03-17 NOTE — Progress Notes (Signed)
Internal Medicine Clinic Attending  Case discussed with Dr. Nguyen  At the time of the visit.  We reviewed the resident's history and exam and pertinent patient test results.  I agree with the assessment, diagnosis, and plan of care documented in the resident's note. 

## 2022-03-21 ENCOUNTER — Encounter (HOSPITAL_BASED_OUTPATIENT_CLINIC_OR_DEPARTMENT_OTHER): Payer: Commercial Managed Care - HMO | Admitting: General Surgery

## 2022-03-22 LAB — TOXASSURE SELECT,+ANTIDEPR,UR

## 2022-03-24 ENCOUNTER — Other Ambulatory Visit (HOSPITAL_COMMUNITY): Payer: Self-pay

## 2022-03-28 ENCOUNTER — Other Ambulatory Visit (HOSPITAL_COMMUNITY): Payer: Self-pay

## 2022-04-04 ENCOUNTER — Encounter (HOSPITAL_BASED_OUTPATIENT_CLINIC_OR_DEPARTMENT_OTHER): Payer: Commercial Managed Care - HMO | Attending: General Surgery | Admitting: General Surgery

## 2022-04-04 DIAGNOSIS — E1140 Type 2 diabetes mellitus with diabetic neuropathy, unspecified: Secondary | ICD-10-CM | POA: Insufficient documentation

## 2022-04-04 DIAGNOSIS — E11628 Type 2 diabetes mellitus with other skin complications: Secondary | ICD-10-CM | POA: Insufficient documentation

## 2022-04-04 DIAGNOSIS — F17200 Nicotine dependence, unspecified, uncomplicated: Secondary | ICD-10-CM | POA: Diagnosis not present

## 2022-04-04 DIAGNOSIS — L97514 Non-pressure chronic ulcer of other part of right foot with necrosis of bone: Secondary | ICD-10-CM | POA: Diagnosis present

## 2022-04-04 DIAGNOSIS — M86171 Other acute osteomyelitis, right ankle and foot: Secondary | ICD-10-CM | POA: Diagnosis not present

## 2022-04-05 NOTE — Progress Notes (Signed)
VINCEN, BEJAR ([id number]) Visit Report for 04/04/2022 Chief Complaint Document Details Patient Name: Date of Service: [name], [name] 04/04/2022 10:30 A M Medical Record Number: [id number] Patient Account Number: [id number] Date of Birth/Sex: Treating RN: 09-02-1991 (31 y.o. Ernestene Mention Primary Care Provider: Gaylan Gerold Other Clinician: Referring Provider: Treating Provider/Extender: Noreene Filbert in Treatment: 3 Information Obtained from: Patient Chief Complaint Patients presents for treatment of an open diabetic ulcer on his right fifth metatarsal head. Electronic Signature(s) Signed: 04/04/2022 12:00:54 PM By: Fredirick Maudlin MD FACS Entered By: Fredirick Maudlin on 04/04/2022 12:00:54 -------------------------------------------------------------------------------- Debridement Details Patient Name: Date of Service: [name], [name] 04/04/2022 10:30 A M Medical Record Number: [id number] Patient Account Number: [id number] Date of Birth/Sex: Treating RN: [birth year] (31 y.o. Janyth Contes Primary Care Provider: Gaylan Gerold Other Clinician: Referring Provider: Treating Provider/Extender: Starla Link Weeks in Treatment: 3 Debridement Performed for Assessment: Wound #1 Right,Dorsal Foot Performed By: Physician Fredirick Maudlin, MD Debridement Type: Debridement Severity of Tissue Pre Debridement: Fat layer exposed Level of Consciousness (Pre-procedure): Awake and Alert Pre-procedure Verification/Time Out Yes - 10:44 Taken: Start Time: 10:44 T Area Debrided (L x W): otal 0.5 (cm) x 1 (cm) = 0.5 (cm) Tissue and other material debrided: Non-Viable, Callus, Eschar, Slough, Subcutaneous, Slough Level: Skin/Subcutaneous Tissue Debridement Description: Excisional Instrument: Curette Bleeding: Minimum Hemostasis Achieved: Pressure End Time: 10:46 Procedural Pain: 0 Post Procedural Pain: 0 Response to Treatment: Procedure was  tolerated well Level of Consciousness (Post- Awake and Alert procedure): Post Debridement Measurements of Total Wound Length: (cm) 0.3 Width: (cm) 0.8 Depth: (cm) 0.4 Volume: (cm) 0.075 Character of Wound/Ulcer Post Debridement: Improved Severity of Tissue Post Debridement: Fat layer exposed Post Procedure Diagnosis Same as Pre-procedure Electronic Signature(s) Signed: 04/04/2022 12:24:33 PM By: Fredirick Maudlin MD FACS Signed: 04/04/2022 5:33:26 PM By: Levan Hurst RN, BSN Entered By: Levan Hurst on 04/04/2022 10:45:46 -------------------------------------------------------------------------------- HPI Details Patient Name: Date of Service: [name], [name] 04/04/2022 10:30 A M Medical Record Number: [id number] Patient Account Number: [id number] Date of Birth/Sex: Treating RN: [birth year] (31 y.o. Ernestene Mention Primary Care Provider: Gaylan Gerold Other Clinician: Referring Provider: Treating Provider/Extender: Starla Link Weeks in Treatment: 3 History of Present Illness HPI Description: ADMISSION 03/14/2022 This is a 31 year old poorly controlled diabetic (last A1c 11.2%). He says the wound opened on his right foot in March. He initially presented to an emergency department on the 25th, but apparently the wait time was long and he elected to leave prior to evaluation. The symptoms got worse and he went to an urgent care facility on March 28. Based upon the evaluation there, he was sent to the hospital emergency room. He was admitted for IV antibiotics. An MRI was performed that was suggestive of osteomyelitis. He underwent incision and drainage of the wound and loose primary closure. He was discharged from the hospital on March 31. He was prescribed a 4-week course of oral Augmentin. He saw infectious disease on April 12. Apparently at that visit the wound appeared to have deteriorated. He saw orthopedic surgery on 27 April. The wound was opened and  debrided. He was referred to the wound care center for further evaluation and management. He says that he has been cleaning the wound with peroxide at home. Today, the wound actually looks quite good, compared to the photos in the electronic medical record. There is some senescent skin and eschar piled up around the margins. Tendon is exposed at the medial aspect of the wound  but there is some granulation tissue visible. No odor or purulent drainage. 04/04/2022: The patient has not been seen since his initial visit. I am not sure why he has not been able to attend his clinic visits. He says that his blood sugars have been running in the 150s. Today, the wound is smaller and appears fairly clean with just a little slough and periwound callus accumulation. No significant odor or drainage. Electronic Signature(s) Signed: 04/04/2022 12:03:38 PM By: Fredirick Maudlin MD FACS Entered By: Fredirick Maudlin on 04/04/2022 12:03:38 -------------------------------------------------------------------------------- Physical Exam Details Patient Name: Date of Service: [name], [name] 04/04/2022 10:30 A M Medical Record Number: [id number] Patient Account Number: [id number] Date of Birth/Sex: Treating RN: 07-Feb-1991 (31 y.o. Ernestene Mention Primary Care Provider: Gaylan Gerold Other Clinician: Referring Provider: Treating Provider/Extender: Starla Link Weeks in Treatment: 3 Constitutional . . . . No acute distress. Respiratory Normal work of breathing on room air. Notes 04/04/2022: The wound is smaller and appears fairly clean with just a little slough and periwound callus accumulation. No significant odor or drainage. Electronic Signature(s) Signed: 04/04/2022 12:04:48 PM By: Fredirick Maudlin MD FACS Entered By: Fredirick Maudlin on 04/04/2022 12:04:47 -------------------------------------------------------------------------------- Physician Orders Details Patient Name: Date of  Service: [name], [name] 04/04/2022 10:30 A M Medical Record Number: [id number] Patient Account Number: [id number] Date of Birth/Sex: Treating RN: 1990/12/24 (31 y.o. Janyth Contes Primary Care Provider: Gaylan Gerold Other Clinician: Referring Provider: Treating Provider/Extender: Noreene Filbert in Treatment: 3 Verbal / Phone Orders: No Diagnosis Coding ICD-10 Coding Code Description L97.514 Non-pressure chronic ulcer of other part of right foot with necrosis of bone E11.8 Type 2 diabetes mellitus with unspecified complications F79.024 Other acute osteomyelitis, right ankle and foot E11.628 Type 2 diabetes mellitus with other skin complications Follow-up Appointments ppointment in 1 week. - Dr. Celine Ahr - Room 1 - Tuesday 6/13 at 10:30 am Return A Bathing/ Shower/ Hygiene May shower and wash wound with soap and water. - use liquid dial antibiotic soap, dry well Edema Control - Lymphedema / SCD / Other Elevate legs to the level of the heart or above for 30 minutes daily and/or when sitting, a frequency of: - when sitting Exercise regularly Moisturize legs daily. Wound Treatment Wound #1 - Foot Wound Laterality: Dorsal, Right Cleanser: Soap and Water 1 x Per Day/30 Days Discharge Instructions: May shower and wash wound with dial antibacterial soap and water prior to dressing change. Cleanser: Wound Cleanser 1 x Per Day/30 Days Discharge Instructions: Cleanse the wound with wound cleanser prior to applying a clean dressing using gauze sponges, not tissue or cotton balls. Cleanser: Byram Ancillary Kit - 15 Day Supply (Generic) 1 x Per Day/30 Days Discharge Instructions: Use supplies as instructed; Kit contains: (15) Saline Bullets; (15) 3x3 Gauze; 15 pr Gloves Prim Dressing: KerraCel Ag Gelling Fiber Dressing, 2x2 in (silver alginate) (Generic) 1 x Per Day/30 Days ary Discharge Instructions: Apply silver alginate to wound bed as instructed Secondary  Dressing: Woven Gauze Sponge, Non-Sterile 4x4 in (Generic) 1 x Per Day/30 Days Discharge Instructions: Apply over primary dressing as directed. Secured With: 52M Medipore Public affairs consultant Surgical T 2x10 (in/yd) 1 x Per Day/30 Days ape Discharge Instructions: Secure with tape as directed. Electronic Signature(s) Signed: 04/04/2022 12:05:04 PM By: Fredirick Maudlin MD FACS Entered By: Fredirick Maudlin on 04/04/2022 12:05:04 -------------------------------------------------------------------------------- Problem List Details Patient Name: Date of Service: [name], [name] 04/04/2022 10:30 A M Medical Record Number: [id number] Patient Account Number: [id number] Date of Birth/Sex:  Treating RN: 1990/11/07 (31 y.o. Janyth Contes Primary Care Provider: Gaylan Gerold Other Clinician: Referring Provider: Treating Provider/Extender: Starla Link Weeks in Treatment: 3 Active Problems ICD-10 Encounter Code Description Active Date MDM Diagnosis L97.514 Non-pressure chronic ulcer of other part of right foot with necrosis of bone 03/14/2022 No Yes E11.8 Type 2 diabetes mellitus with unspecified complications 5/46/5681 No Yes M86.171 Other acute osteomyelitis, right ankle and foot 03/14/2022 No Yes E11.628 Type 2 diabetes mellitus with other skin complications 2/75/1700 No Yes Inactive Problems Resolved Problems Electronic Signature(s) Signed: 04/04/2022 12:00:29 PM By: Fredirick Maudlin MD FACS Entered By: Fredirick Maudlin on 04/04/2022 12:00:28 -------------------------------------------------------------------------------- Progress Note Details Patient Name: Date of Service: [name], [name] 04/04/2022 10:30 A M Medical Record Number: [id number] Patient Account Number: [id number] Date of Birth/Sex: Treating RN: 02-06-1991 (31 y.o. Ernestene Mention Primary Care Provider: Gaylan Gerold Other Clinician: Referring Provider: Treating Provider/Extender: Noreene Filbert in Treatment: 3 Subjective Chief Complaint Information obtained from Patient Patients presents for treatment of an open diabetic ulcer on his right fifth metatarsal head. History of Present Illness (HPI) ADMISSION 03/14/2022 This is a 31 year old poorly controlled diabetic (last A1c 11.2%). He says the wound opened on his right foot in March. He initially presented to an emergency department on the 25th, but apparently the wait time was long and he elected to leave prior to evaluation. The symptoms got worse and he went to an urgent care facility on March 28. Based upon the evaluation there, he was sent to the hospital emergency room. He was admitted for IV antibiotics. An MRI was performed that was suggestive of osteomyelitis. He underwent incision and drainage of the wound and loose primary closure. He was discharged from the hospital on March 31. He was prescribed a 4-week course of oral Augmentin. He saw infectious disease on April 12. Apparently at that visit the wound appeared to have deteriorated. He saw orthopedic surgery on 27 April. The wound was opened and debrided. He was referred to the wound care center for further evaluation and management. He says that he has been cleaning the wound with peroxide at home. Today, the wound actually looks quite good, compared to the photos in the electronic medical record. There is some senescent skin and eschar piled up around the margins. Tendon is exposed at the medial aspect of the wound but there is some granulation tissue visible. No odor or purulent drainage. 04/04/2022: The patient has not been seen since his initial visit. I am not sure why he has not been able to attend his clinic visits. He says that his blood sugars have been running in the 150s. Today, the wound is smaller and appears fairly clean with just a little slough and periwound callus accumulation. No significant odor or drainage. Patient History Information obtained  from Patient. Family History Diabetes - Mother,Paternal Grandparents,Maternal Grandparents, Heart Disease - Mother, Hypertension - Mother,Father, Stroke - Mother. Social History Current every day smoker - 2 a day, Marital Status - Single, Alcohol Use - Never, Drug Use - No History, Caffeine Use - Daily - soda, red bull. Medical History Endocrine Patient has history of Type II Diabetes Neurologic Patient has history of Neuropathy Medical A Surgical History Notes nd Endocrine uncontrolled Objective Constitutional No acute distress. Vitals Time Taken: 10:30 AM, Height: 66 in, Weight: 180 lbs, BMI: 29, Temperature: 98.4 F, Pulse: 99 bpm, Respiratory Rate: 16 breaths/min, Blood Pressure: 136/89 mmHg, Capillary Blood Glucose: 149 mg/dl. General Notes: glucose per  pt report Respiratory Normal work of breathing on room air. General Notes: 04/04/2022: The wound is smaller and appears fairly clean with just a little slough and periwound callus accumulation. No significant odor or drainage. Integumentary (Hair, Skin) Wound #1 status is Open. Original cause of wound was Blister. The date acquired was: 01/21/2022. The wound has been in treatment 3 weeks. The wound is located on the Right,Dorsal Foot. The wound measures 0.3cm length x 0.8cm width x 0.4cm depth; 0.188cm^2 area and 0.075cm^3 volume. There is Fat Layer (Subcutaneous Tissue) exposed. There is no tunneling noted, however, there is undermining starting at 5:00 and ending at 9:00 with a maximum distance of 0.3cm. There is a medium amount of serosanguineous drainage noted. The wound margin is thickened. There is large (67-100%) red, pink granulation within the wound bed. There is a small (1-33%) amount of necrotic tissue within the wound bed including Adherent Slough. 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 There was a Excisional Skin/Subcutaneous Tissue Debridement with a total area of 0.5 sq cm performed by Fredirick Maudlin, MD. With the following instrument(s): Curette to remove Non-Viable tissue/material. Material removed includes Eschar, Callus, Subcutaneous Tissue, and Slough. No specimens were taken. A time out was conducted at 10:44, prior to the start of the procedure. A Minimum amount of bleeding was controlled with Pressure. The procedure was tolerated well with a pain level of 0 throughout and a pain level of 0 following the procedure. Post Debridement Measurements: 0.3cm length x 0.8cm width x 0.4cm depth; 0.075cm^3 volume. Character of Wound/Ulcer Post Debridement is improved. Severity of Tissue Post Debridement is: Fat layer exposed. Post procedure Diagnosis Wound #1: Same as Pre-Procedure Plan Follow-up Appointments: Return Appointment in 1 week. - Dr. Celine Ahr - Room 1 - Tuesday 6/13 at 10:30 am Bathing/ Shower/ Hygiene: May shower and wash wound with soap and water. - use liquid dial antibiotic soap, dry well Edema Control - Lymphedema / SCD / Other: Elevate legs to the level of the heart or above for 30 minutes daily and/or when sitting, a frequency of: - when sitting Exercise regularly Moisturize legs daily. WOUND #1: - Foot Wound Laterality: Dorsal, Right Cleanser: Soap and Water 1 x Per Day/30 Days Discharge Instructions: May shower and wash wound with dial antibacterial soap and water prior to dressing change. Cleanser: Wound Cleanser 1 x Per Day/30 Days Discharge Instructions: Cleanse the wound with wound cleanser prior to applying a clean dressing using gauze sponges, not tissue or  cotton balls. Cleanser: Byram Ancillary Kit - 15 Day Supply (Generic) 1 x Per Day/30 Days Discharge Instructions: Use supplies as instructed; Kit contains: (15) Saline Bullets; (15) 3x3 Gauze; 15 pr Gloves Prim Dressing: KerraCel Ag Gelling Fiber Dressing, 2x2 in (silver alginate) (Generic) 1 x Per Day/30 Days ary Discharge Instructions: Apply silver alginate to wound bed as instructed Secondary Dressing: Woven Gauze Sponge, Non-Sterile 4x4 in (Generic) 1 x Per Day/30 Days Discharge Instructions: Apply over primary dressing as directed. Secured With: 72M Medipore Public affairs consultant Surgical T 2x10 (in/yd) 1 x Per Day/30 Days ape Discharge Instructions: Secure with tape as directed. 04/04/2022: The wound is smaller and  appears fairly clean with just a little slough and periwound callus accumulation. No significant odor or drainage. I debrided the slough and periwound callus from the wound today. We will continue using silver alginate. Follow-up in 1 week. Electronic Signature(s) Signed: 04/04/2022 12:05:26 PM By: Fredirick Maudlin MD FACS Entered By: Fredirick Maudlin on 04/04/2022 12:05:26 -------------------------------------------------------------------------------- HxROS Details Patient Name: Date of Service: [name], [name] 04/04/2022 10:30 A M Medical Record Number: [id number] Patient Account Number: [id number] Date of Birth/Sex: Treating RN: 1991/04/19 (31 y.o. Ernestene Mention Primary Care Provider: Gaylan Gerold Other Clinician: Referring Provider: Treating Provider/Extender: Noreene Filbert in Treatment: 3 Information Obtained From Patient Endocrine Medical History: Positive for: Type II Diabetes Past Medical History Notes: uncontrolled Time with diabetes: 2019 Treated with: Insulin Blood sugar tested every day: Yes Tested : 2 Neurologic Medical History: Positive for: Neuropathy Immunizations Pneumococcal Vaccine: Received Pneumococcal Vaccination:  No Implantable Devices None Family and Social History Diabetes: Yes - Mother,Paternal Grandparents,Maternal Grandparents; Heart Disease: Yes - Mother; Hypertension: Yes - Mother,Father; Stroke: Yes - Mother; Current every day smoker - 2 a day; Marital Status - Single; Alcohol Use: Never; Drug Use: No History; Caffeine Use: Daily - soda, red bull; Financial Concerns: No; Food, Clothing or Shelter Needs: No; Support System Lacking: No; Transportation Concerns: No Electronic Signature(s) Signed: 04/04/2022 12:24:33 PM By: Fredirick Maudlin MD FACS Signed: 04/05/2022 5:06:14 PM By: Baruch Gouty RN, BSN Entered By: Fredirick Maudlin on 04/04/2022 12:03:53 -------------------------------------------------------------------------------- SuperBill Details Patient Name: Date of Service: [name], [name] 04/04/2022 Medical Record Number: [id number] Patient Account Number: [id number] Date of Birth/Sex: Treating RN: 19-Jun-1991 (31 y.o. Ernestene Mention Primary Care Provider: Gaylan Gerold Other Clinician: Referring Provider: Treating Provider/Extender: Starla Link Weeks in Treatment: 3 Diagnosis Coding ICD-10 Codes Code Description [phone] Non-pressure chronic ulcer of other part of right foot with necrosis of bone E11.8 Type 2 diabetes mellitus with unspecified complications L49.179 Other acute osteomyelitis, right ankle and foot E11.628 Type 2 diabetes mellitus with other skin complications Facility Procedures CPT4 Code: [id number] Description: 48016 - DEB SUBQ TISSUE 20 SQ CM/< ICD-10 Diagnosis Description L97.514 Non-pressure chronic ulcer of other part of right foot with necrosis of bone Modifier: Quantity: 1 Physician Procedures : CPT4 Code Description Modifier [id number] - WC PHYS LEVEL 3 - EST PT 25 ICD-10 Diagnosis Description L97.514 Non-pressure chronic ulcer of other part of right foot with necrosis of bone E11.8 Type 2 diabetes mellitus with unspecified  complications  L54.492 Other acute osteomyelitis, right ankle and foot E11.628 Type 2 diabetes mellitus with other skin complications Quantity: 1 : [id number] 11042 - WC PHYS SUBQ TISS 20 SQ CM ICD-10 Diagnosis Description L97.514 Non-pressure chronic ulcer of other part of right foot with necrosis of bone Quantity: 1 Electronic Signature(s) Signed: 04/04/2022 12:05:46 PM By: Fredirick Maudlin MD FACS Signed: 04/04/2022 12:05:46 PM By: Fredirick Maudlin MD FACS Entered By: Fredirick Maudlin on 04/04/2022 12:05:46

## 2022-04-05 NOTE — Progress Notes (Signed)
[name], [name] ([id number]) Visit Report for 04/04/2022 Arrival Information Details Patient Name: Date of Service: [name] 04/04/2022 10:30 A M Medical Record Number: [id number] Patient Account Number: [id number] Date of Birth/Sex: Treating RN: 1991-01-10 (31 y.o. Janyth Contes Primary Care Amay Mijangos: Gaylan Gerold Other Clinician: Referring Eliceo Gladu: Treating Yuvia Plant/Extender: Noreene Filbert in Treatment: 3 Visit Information History Since Last Visit Added or deleted any medications: No Patient Arrived: Ambulatory Any new allergies or adverse reactions: No Arrival Time: 10:30 Had a fall or experienced change in No Accompanied By: alone activities of daily living that may affect Transfer Assistance: None risk of falls: Patient Identification Verified: Yes Signs or symptoms of abuse/neglect since last visito No Secondary Verification Process Completed: Yes Hospitalized since last visit: No Patient Requires Transmission-Based Precautions: No Implantable device outside of the clinic excluding No Patient Has Alerts: Yes cellular tissue based products placed in the center Patient Alerts: ABI: R 1.09, L 1.02 since last visit: TBI: R 0.70, L 0.72 Has Dressing in Place as Prescribed: Yes Pain Present Now: No Electronic Signature(s) Signed: 04/04/2022 5:33:26 PM By: Levan Hurst RN, BSN Entered By: Levan Hurst on 04/04/2022 10:31:05 -------------------------------------------------------------------------------- Encounter Discharge Information Details Patient Name: Date of Service: [name] 04/04/2022 10:30 A M Medical Record Number: [id number] Patient Account Number: [id number] Date of Birth/Sex: Treating RN: 1991-07-09 (31 y.o. Janyth Contes Primary Care Naryah Clenney: Gaylan Gerold Other Clinician: Referring Milda Lindvall: Treating Merrily Tegeler/Extender: Noreene Filbert in Treatment: 3 Encounter Discharge Information  Items Post Procedure Vitals Discharge Condition: Stable Temperature (F): 98.4 Ambulatory Status: Ambulatory Pulse (bpm): 99 Discharge Destination: Home Respiratory Rate (breaths/min): 16 Transportation: Private Auto Blood Pressure (mmHg): 136/89 Accompanied By: alone Schedule Follow-up Appointment: Yes Clinical Summary of Care: Patient Declined Electronic Signature(s) Signed: 04/04/2022 5:33:26 PM By: Levan Hurst RN, BSN Entered By: Levan Hurst on 04/04/2022 12:01:40 -------------------------------------------------------------------------------- Lower Extremity Assessment Details Patient Name: Date of Service: [name], [name] 04/04/2022 10:30 A M Medical Record Number: [id number] Patient Account Number: [id number] Date of Birth/Sex: Treating RN: 09/30/1991 (31 y.o. Janyth Contes Primary Care [name]: Gaylan Gerold Other Clinician: Referring Deniesha Stenglein: Treating Toniqua Melamed/Extender: Starla Link Weeks in Treatment: 3 Edema Assessment Assessed: [Left: No] [Right: No] Edema: [Left: N] [Right: o] Calf Left: Right: Point of Measurement: 31 cm From Medial Instep 37 cm Ankle Left: Right: Point of Measurement: 10.5 cm From Medial Instep 23.5 cm Vascular Assessment Pulses: Dorsalis Pedis Palpable: [Right:Yes] Electronic Signature(s) Signed: 04/04/2022 5:33:26 PM By: Levan Hurst RN, BSN Entered By: Levan Hurst on 04/04/2022 10:31:44 -------------------------------------------------------------------------------- Multi Wound Chart Details Patient Name: Date of Service: [name] 04/04/2022 10:30 A M Medical Record Number: [id number] Patient Account Number: [id number] Date of Birth/Sex: Treating RN: 01-02-1991 (31 y.o. Ernestene Mention Primary Care Akire Rennert: Gaylan Gerold Other Clinician: Referring Michiel Sivley: Treating Bonny Egger/Extender: Starla Link Weeks in Treatment: 3 Vital Signs Height(in): 66 Capillary Blood  Glucose(mg/dl): 149 Weight(lbs): 180 Pulse(bpm): 99 Body Mass Index(BMI): 29 Blood Pressure(mmHg): 136/89 Temperature(F): 98.4 Respiratory Rate(breaths/min): 16 Photos: [1:Right, Dorsal Foot] [N/A:N/A N/A] Wound Location: [1:Blister] [N/A:N/A] Wounding Event: [1:Diabetic Wound/Ulcer of the Lower] [N/A:N/A] Primary Etiology: [1:Extremity Type II Diabetes, Neuropathy] [N/A:N/A] Comorbid History: [1:01/21/2022] [N/A:N/A] Date Acquired: [1:3] [N/A:N/A] Weeks of Treatment: [1:Open] [N/A:N/A] Wound Status: [1:No] [N/A:N/A] Wound Recurrence: [1:0.3x0.8x0.4] [N/A:N/A] Measurements L x W x D (cm) [1:0.188] [N/A:N/A] A (cm) : rea [1:0.075] [N/A:N/A] Volume (cm) : [1:88.50%] [N/A:N/A] % Reduction in A [1:rea: 88.50%] [N/A:N/A] % Reduction in Volume: [1:5] Starting Position  1 (o'clock): [1:9] Ending Position 1 (o'clock): [1:0.3] Maximum Distance 1 (cm): [1:Yes] [N/A:N/A] Undermining: [1:Grade 3] [N/A:N/A] Classification: [1:Medium] [N/A:N/A] Exudate A mount: [1:Serosanguineous] [N/A:N/A] Exudate Type: [1:red, brown] [N/A:N/A] Exudate Color: [1:Thickened] [N/A:N/A] Wound Margin: [1:Large (67-100%)] [N/A:N/A] Granulation A mount: [1:Red, Pink] [N/A:N/A] Granulation Quality: [1:Small (1-33%)] [N/A:N/A] Necrotic A mount: [1:Fat Layer (Subcutaneous Tissue): Yes N/A] Exposed Structures: [1:Fascia: No Tendon: No Muscle: No Joint: No Bone: No Medium (34-66%)] [N/A:N/A] Epithelialization: [1:Debridement - Excisional] [N/A:N/A] Debridement: Pre-procedure Verification/Time Out 10:44 [N/A:N/A] Taken: [1:Necrotic/Eschar, Callus,] [N/A:N/A] Tissue Debrided: [1:Subcutaneous, Slough Skin/Subcutaneous Tissue] [N/A:N/A] Level: [1:0.5] [N/A:N/A] Debridement A (sq cm): [1:rea Curette] [N/A:N/A] Instrument: [1:Minimum] [N/A:N/A] Bleeding: [1:Pressure] [N/A:N/A] Hemostasis Achieved: [1:0] [N/A:N/A] Procedural Pain: [1:0] [N/A:N/A] Post Procedural Pain: Debridement Treatment Response: Procedure was  tolerated well [N/A:N/A] Post Debridement Measurements L x 0.3x0.8x0.4 [N/A:N/A] W x D (cm) [1:0.075] [N/A:N/A] Post Debridement Volume: (cm) [1:Debridement] [N/A:N/A] Treatment Notes Electronic Signature(s) Signed: 04/04/2022 12:00:48 PM By: Fredirick Maudlin MD FACS Signed: 04/05/2022 5:06:14 PM By: Baruch Gouty RN, BSN Entered By: Fredirick Maudlin on 04/04/2022 12:00:48 -------------------------------------------------------------------------------- Multi-Disciplinary Care Plan Details Patient Name: Date of Service: DAGMAWI, VENABLE 04/04/2022 10:30 A M Medical Record Number: [id number] Patient Account Number: [id number] Date of Birth/Sex: Treating RN: 06-02-1991 (31 y.o. Janyth Contes Primary Care Patsie Mccardle: Gaylan Gerold Other Clinician: Referring Ithan Touhey: Treating Tawn Fitzner/Extender: Starla Link Weeks in Treatment: 3 Active Inactive Nutrition Nursing Diagnoses: Impaired glucose control: actual or potential Goals: Patient/caregiver verbalizes understanding of need to maintain therapeutic glucose control per primary care physician Date Initiated: 03/14/2022 Target Resolution Date: 04/11/2022 Goal Status: Active Interventions: Assess patient nutrition upon admission and as needed per policy Provide education on elevated blood sugars and impact on wound healing Notes: Wound/Skin Impairment Nursing Diagnoses: Impaired tissue integrity Knowledge deficit related to smoking impact on wound healing Knowledge deficit related to ulceration/compromised skin integrity Goals: Patient will demonstrate a reduced rate of smoking or cessation of smoking Date Initiated: 03/14/2022 Target Resolution Date: 04/11/2022 Goal Status: Active Patient/caregiver will verbalize understanding of skin care regimen Date Initiated: 03/14/2022 Target Resolution Date: 04/11/2022 Goal Status: Active Ulcer/skin breakdown will have a volume reduction of 30% by week 4 Date Initiated:  03/14/2022 Target Resolution Date: 04/11/2022 Goal Status: Active Interventions: Assess patient/caregiver ability to obtain necessary supplies Assess patient/caregiver ability to perform ulcer/skin care regimen upon admission and as needed Assess ulceration(s) every visit Provide education on smoking Provide education on ulcer and skin care Notes: Electronic Signature(s) Signed: 04/04/2022 5:33:26 PM By: Levan Hurst RN, BSN Entered By: Levan Hurst on 04/04/2022 10:43:39 -------------------------------------------------------------------------------- Pain Assessment Details Patient Name: Date of Service: [name], [name] 04/04/2022 10:30 A M Medical Record Number: [id number] Patient Account Number: [id number] Date of Birth/Sex: Treating RN: 04/10/1991 (31 y.o. Janyth Contes Primary Care Zanaya Baize: Gaylan Gerold Other Clinician: Referring Orit Sanville: Treating Karter Hellmer/Extender: Starla Link Weeks in Treatment: 3 Active Problems Location of Pain Severity and Description of Pain Patient Has Paino No Site Locations Pain Management and Medication Current Pain Management: Electronic Signature(s) Signed: 04/04/2022 5:33:26 PM By: Levan Hurst RN, BSN Entered By: Levan Hurst on 04/04/2022 10:31:38 -------------------------------------------------------------------------------- Patient/Caregiver Education Details Patient Name: Date of Service: Rhea Belton 6/6/2023andnbsp10:30 [place] Record Number: [id number] Patient Account Number: [id number] Date of Birth/Gender: Treating RN: August 07, 1991 (31 y.o. Janyth Contes Primary Care Physician: Gaylan Gerold Other Clinician: Referring Physician: Treating Physician/Extender: Noreene Filbert in Treatment: 3 Education Assessment Education Provided To: Patient Education Topics Provided Wound/Skin Impairment: Methods: Explain/Verbal Responses: State  content correctly Electronic  Signature(s) Signed: 04/04/2022 5:33:26 PM By: Levan Hurst RN, BSN Entered By: Levan Hurst on 04/04/2022 10:44:02 -------------------------------------------------------------------------------- Wound Assessment Details Patient Name: Date of Service: [name], [name] 04/04/2022 10:30 A M Medical Record Number: [id number] Patient Account Number: [id number] Date of Birth/Sex: Treating RN: Feb 08, 1991 (31 y.o. Janyth Contes Primary Care Chereese Cilento: Gaylan Gerold Other Clinician: Referring Tamecia Mcdougald: Treating Shiza Thelen/Extender: Starla Link Weeks in Treatment: 3 Wound Status Wound Number: 1 Primary Etiology: Diabetic Wound/Ulcer of the Lower Extremity Wound Location: Right, Dorsal Foot Wound Status: Open Wounding Event: Blister Comorbid History: Type II Diabetes, Neuropathy Date Acquired: 01/21/2022 Weeks Of Treatment: 3 Clustered Wound: No Photos Wound Measurements Length: (cm) 0.3 Width: (cm) 0.8 Depth: (cm) 0.4 Area: (cm) 0.188 Volume: (cm) 0.075 % Reduction in Area: 88.5% % Reduction in Volume: 88.5% Epithelialization: Medium (34-66%) Tunneling: No Undermining: Yes Starting Position (o'clock): 5 Ending Position (o'clock): 9 Maximum Distance: (cm) 0.3 Wound Description Classification: Grade 3 Wound Margin: Thickened Exudate Amount: Medium Exudate Type: Serosanguineous Exudate Color: red, brown Foul Odor After Cleansing: No Slough/Fibrino Yes Wound Bed Granulation Amount: Large (67-100%) Exposed Structure Granulation Quality: Red, Pink Fascia Exposed: No Necrotic Amount: Small (1-33%) Fat Layer (Subcutaneous Tissue) Exposed: Yes Necrotic Quality: Adherent Slough Tendon Exposed: No Muscle Exposed: No Joint Exposed: No Bone Exposed: No Treatment Notes Wound #1 (Foot) Wound Laterality: Dorsal, Right Cleanser Soap and Water Discharge Instruction: May shower and wash wound with dial antibacterial soap and water prior to dressing  change. Wound Cleanser Discharge Instruction: Cleanse the wound with wound cleanser prior to applying a clean dressing using gauze sponges, not tissue or cotton balls. [place] Ancillary Kit - 15 Day Supply Discharge Instruction: Use supplies as instructed; Kit contains: (15) Saline Bullets; (15) 3x3 Gauze; 15 pr Gloves Peri-Wound Care Topical Primary Dressing KerraCel Ag Gelling Fiber Dressing, 2x2 in (silver alginate) Discharge Instruction: Apply silver alginate to wound bed as instructed Secondary Dressing Woven Gauze Sponge, Non-Sterile 4x4 in Discharge Instruction: Apply over primary dressing as directed. Secured With [employer] Surgical T 2x10 (in/yd) ape Discharge Instruction: Secure with tape as directed. Compression Wrap Compression Stockings Add-Ons Electronic Signature(s) Signed: 04/04/2022 5:33:26 PM By: Levan Hurst RN, BSN Entered By: Levan Hurst on 04/04/2022 10:36:53 -------------------------------------------------------------------------------- Vitals Details Patient Name: Date of Service: [name], [name] 04/04/2022 10:30 A M Medical Record Number: [id number] Patient Account Number: [id number] Date of Birth/Sex: Treating RN: 11-26-1990 (31 y.o. Janyth Contes Primary Care Tanganika Barradas: Gaylan Gerold Other Clinician: Referring Kerith Sherley: Treating Calix Heinbaugh/Extender: Starla Link Weeks in Treatment: 3 Vital Signs Time Taken: 10:30 Temperature (F): 98.4 Height (in): 66 Pulse (bpm): 99 Weight (lbs): 180 Respiratory Rate (breaths/min): 16 Body Mass Index (BMI): 29 Blood Pressure (mmHg): 136/89 Capillary Blood Glucose (mg/dl): 149 Reference Range: 80 - 120 mg / dl Notes glucose per pt report Electronic Signature(s) Signed: 04/04/2022 5:33:26 PM By: Levan Hurst RN, BSN Entered By: Levan Hurst on 04/04/2022 10:43:06

## 2022-04-11 ENCOUNTER — Encounter (HOSPITAL_BASED_OUTPATIENT_CLINIC_OR_DEPARTMENT_OTHER): Payer: Commercial Managed Care - HMO | Admitting: General Surgery

## 2022-04-25 ENCOUNTER — Ambulatory Visit (HOSPITAL_BASED_OUTPATIENT_CLINIC_OR_DEPARTMENT_OTHER): Payer: Commercial Managed Care - HMO | Admitting: General Surgery

## 2022-04-27 ENCOUNTER — Other Ambulatory Visit (HOSPITAL_COMMUNITY): Payer: Self-pay

## 2022-04-27 ENCOUNTER — Other Ambulatory Visit: Payer: Self-pay | Admitting: Student

## 2022-04-27 DIAGNOSIS — F1111 Opioid abuse, in remission: Secondary | ICD-10-CM

## 2022-04-28 ENCOUNTER — Other Ambulatory Visit: Payer: Self-pay | Admitting: Student

## 2022-04-28 ENCOUNTER — Other Ambulatory Visit (HOSPITAL_COMMUNITY): Payer: Self-pay

## 2022-04-28 DIAGNOSIS — F1111 Opioid abuse, in remission: Secondary | ICD-10-CM

## 2022-04-28 MED ORDER — BUPRENORPHINE HCL-NALOXONE HCL 8-2 MG SL SUBL
1.0000 | SUBLINGUAL_TABLET | Freq: Three times a day (TID) | SUBLINGUAL | 0 refills | Status: DC
  Filled 2022-04-28: qty 90, 30d supply, fill #0

## 2022-04-28 NOTE — Telephone Encounter (Signed)
Last ToxAssure   03/16/2022.  Last visit 02/09/2022. Next office visit 05/01/2022.

## 2022-05-01 ENCOUNTER — Encounter: Payer: Commercial Managed Care - HMO | Admitting: Student

## 2022-05-01 ENCOUNTER — Other Ambulatory Visit (HOSPITAL_COMMUNITY): Payer: Self-pay

## 2022-05-08 ENCOUNTER — Other Ambulatory Visit (HOSPITAL_COMMUNITY): Payer: Self-pay

## 2022-05-08 ENCOUNTER — Encounter: Payer: Commercial Managed Care - HMO | Admitting: Student

## 2022-05-17 ENCOUNTER — Other Ambulatory Visit (HOSPITAL_COMMUNITY): Payer: Self-pay

## 2022-05-17 ENCOUNTER — Encounter: Payer: Self-pay | Admitting: Student

## 2022-05-17 ENCOUNTER — Other Ambulatory Visit: Payer: Self-pay

## 2022-05-17 ENCOUNTER — Ambulatory Visit (INDEPENDENT_AMBULATORY_CARE_PROVIDER_SITE_OTHER): Payer: Commercial Managed Care - HMO | Admitting: Student

## 2022-05-17 VITALS — BP 131/81 | HR 93 | Temp 98.2°F | Ht 66.0 in | Wt 200.0 lb

## 2022-05-17 DIAGNOSIS — Z794 Long term (current) use of insulin: Secondary | ICD-10-CM | POA: Diagnosis not present

## 2022-05-17 DIAGNOSIS — E119 Type 2 diabetes mellitus without complications: Secondary | ICD-10-CM

## 2022-05-17 DIAGNOSIS — E1110 Type 2 diabetes mellitus with ketoacidosis without coma: Secondary | ICD-10-CM

## 2022-05-17 DIAGNOSIS — F1111 Opioid abuse, in remission: Secondary | ICD-10-CM | POA: Diagnosis not present

## 2022-05-17 DIAGNOSIS — E109 Type 1 diabetes mellitus without complications: Secondary | ICD-10-CM

## 2022-05-17 DIAGNOSIS — N529 Male erectile dysfunction, unspecified: Secondary | ICD-10-CM | POA: Diagnosis not present

## 2022-05-17 LAB — BASIC METABOLIC PANEL
Anion gap: 10 (ref 5–15)
BUN: 6 mg/dL (ref 6–20)
CO2: 27 mmol/L (ref 22–32)
Calcium: 9 mg/dL (ref 8.9–10.3)
Chloride: 98 mmol/L (ref 98–111)
Creatinine, Ser: 0.65 mg/dL (ref 0.61–1.24)
GFR, Estimated: >60 mL/min (ref >60)
Glucose, Bld: 501 mg/dL (ref 70–99)
Potassium: 4.5 mmol/L (ref 3.5–5.1)
Sodium: 135 mmol/L (ref 135–145)

## 2022-05-17 LAB — URINALYSIS, ROUTINE W REFLEX MICROSCOPIC
Bilirubin Urine: NEGATIVE
Glucose, UA: >=500 mg/dL — AB
Hgb urine dipstick: NEGATIVE
Ketones, ur: NEGATIVE mg/dL
Leukocytes,Ua: NEGATIVE
Nitrite: NEGATIVE
Protein, ur: NEGATIVE mg/dL
Specific Gravity, Urine: 1.024 (ref 1.005–1.030)
pH: 6 (ref 5.0–8.0)

## 2022-05-17 LAB — GLUCOSE, CAPILLARY
Glucose-Capillary: 508 mg/dL (ref 70–99)
Glucose-Capillary: 494 mg/dL — ABNORMAL HIGH (ref 70–99)

## 2022-05-17 LAB — POCT GLYCOSYLATED HEMOGLOBIN (HGB A1C): Hemoglobin A1C: 9.1 % — AB (ref 4.0–5.6)

## 2022-05-17 MED ORDER — FREESTYLE LITE TEST VI STRP
ORAL_STRIP | 0 refills | Status: AC
  Filled 2022-05-17: qty 100, 25d supply, fill #0

## 2022-05-17 MED ORDER — SILDENAFIL CITRATE 50 MG PO TABS: 50.0000 mg | ORAL_TABLET | ORAL | 0 refills | Status: DC | PRN

## 2022-05-17 MED ORDER — SEMAGLUTIDE(0.25 OR 0.5MG/DOS) 2 MG/3ML ~~LOC~~ SOPN
0.2500 mg | PEN_INJECTOR | SUBCUTANEOUS | 2 refills | Status: DC
  Filled 2022-05-17: qty 3, 28d supply, fill #0

## 2022-05-17 MED ORDER — BASAGLAR KWIKPEN 100 UNIT/ML ~~LOC~~ SOPN
28.0000 [IU] | PEN_INJECTOR | Freq: Every day | SUBCUTANEOUS | 2 refills | Status: DC
  Filled 2022-05-17: qty 15, 53d supply, fill #0

## 2022-05-17 MED ORDER — BLOOD GLUCOSE MONITOR KIT
PACK | 0 refills | Status: AC
  Filled 2022-05-17: qty 1, 1d supply, fill #0

## 2022-05-17 MED ORDER — TRULICITY 0.75 MG/0.5ML ~~LOC~~ SOAJ
0.7500 mg | SUBCUTANEOUS | 2 refills | Status: DC
  Filled 2022-05-17 – 2022-08-27 (×3): qty 2, 28d supply, fill #0

## 2022-05-17 MED ORDER — INSULIN ASPART 100 UNIT/ML IJ SOLN
10.0000 [IU] | Freq: Once | INTRAMUSCULAR | Status: AC
  Administered 2022-05-17: 10 [IU] via SUBCUTANEOUS

## 2022-05-17 MED ORDER — BUPRENORPHINE HCL-NALOXONE HCL 8-2 MG SL SUBL
1.0000 | SUBLINGUAL_TABLET | Freq: Three times a day (TID) | SUBLINGUAL | 0 refills | Status: DC
  Filled 2022-06-02: qty 90, 30d supply, fill #0

## 2022-05-17 MED ORDER — FREESTYLE LANCETS MISC
0 refills | Status: AC
  Filled 2022-05-17: qty 100, 25d supply, fill #0

## 2022-05-17 NOTE — Assessment & Plan Note (Addendum)
Patient presents to clinic with CBG of 508.  Patient denies any symptoms of nausea, vomiting, abdominal pain or diarrhea.  He state that he feels well.  He did not administer his daily long-acting insulin today.  Said he just ate a big meal and drink a cup of sweet tea.  Patient reports adherence to his long-acting which is 28 units of Basaglar daily.  He has not been using his Humalog for the last 3 days.  He also ran out of his Ozempic 2 weeks ago.  Patient currently not checking his blood sugar because he did not receive his glucometer.  States that he cannot afford the Dexcom.  Patient denies any hypoglycemic events.  Recheck A1c improved from 11.2 to 9.1.  Fortunately patient does not have DKA, stat BMP did not show evidence of anion gap metabolic acidosis and UA was negative for ketonuria.  He was given 10 units of NovoLog in the clinic and advised to administer his long-acting at home right after this visit.  Repeat CBG mildly improved at 494.  -At this time there is no data for [place] to adjust his insulin so we will continue current regimen -Basaglar 28 units daily -Humalog 10 units 3 times daily -Will switch Ozempic to Trulicity due to insurance coverage.  Starting with 0.75 mg weekly -Prescribed glucometer and test strips supply -Follow-up in 4 weeks for CBG monitoring
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
## 2022-05-17 NOTE — Assessment & Plan Note (Addendum)
This is a chronic issue for the last few years.  Patient reports issue of erection.  He is not sure about ejaculation because of his inability to obtaining erection.  Patient also does not have morning erection.  He denies any change in his hair pattern or thinning of his facial hair.  Physical exam reveals normal anatomy of his penis.  No curvature or Peyronie's disease.  Normal sized testicles.  His erectile dysfunction is likely secondary to his uncontrolled diabetes.  I have low suspicion for hypotestosteronism at this time.  He was given generic Viagra in the past but it was not effective.  He would like to try it the brand name Viagra at this time.  -Viagra 50 mg as needed.  30 pills prescription.  Good Rx printed -Continue to work on his diabetes control

## 2022-05-17 NOTE — Addendum Note (Signed)
Addended byDoran Stabler on: 05/17/2022 02:20 PM   Modules accepted: Orders

## 2022-05-17 NOTE — Patient Instructions (Addendum)
Mr. Hudgins,  It was nice seeing you in the clinic today.  Here is a summary what we talked about:  1.  Your blood sugar is very high today.  Please let us know if you have any symptoms of nausea, vomiting abdominal pain.  Please inject 20 units of Basaglar when you go home.  I also prescribed a glucometer kit at the [facility] outpatient pharmacy.  2.  I have refilled your Ozempic.  3.  I sent a refill for your Suboxone.  4.  Lets try Viagra for your erectile dysfunction.  Let me know if the medication is not working.  Please return in 1 month  Take care  Dr. Alfonse Spruce
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
## 2022-05-22 LAB — TOXASSURE SELECT,+ANTIDEPR,UR

## 2022-05-25 NOTE — Progress Notes (Signed)
Internal Medicine Clinic Attending  Case discussed with Dr. Nguyen  at the time of the visit.  We reviewed the resident's history and exam and pertinent patient test results.  I agree with the assessment, diagnosis, and plan of care documented in the resident's note.  

## 2022-05-29 ENCOUNTER — Other Ambulatory Visit (HOSPITAL_COMMUNITY): Payer: Self-pay

## 2022-06-02 ENCOUNTER — Other Ambulatory Visit (HOSPITAL_COMMUNITY): Payer: Self-pay

## 2022-06-09 ENCOUNTER — Other Ambulatory Visit (HOSPITAL_COMMUNITY): Payer: Self-pay

## 2022-06-09 MED ORDER — BASAGLAR KWIKPEN 100 UNIT/ML ~~LOC~~ SOPN
28.0000 [IU] | PEN_INJECTOR | Freq: Every day | SUBCUTANEOUS | 2 refills | Status: DC
  Filled 2022-06-09: qty 9, 32d supply, fill #0

## 2022-06-09 MED ORDER — ONETOUCH VERIO W/DEVICE KIT
PACK | Freq: Every day | 2 refills | Status: AC
  Filled 2022-06-09: qty 1, 30d supply, fill #0

## 2022-06-09 MED ORDER — TRULICITY 0.75 MG/0.5ML ~~LOC~~ SOAJ
0.7500 mg | SUBCUTANEOUS | 5 refills | Status: DC
  Filled 2022-06-09: qty 2, 28d supply, fill #0

## 2022-06-09 MED ORDER — FARXIGA 5 MG PO TABS
5.0000 mg | ORAL_TABLET | Freq: Every day | ORAL | 1 refills | Status: DC
  Filled 2022-06-09: qty 90, 90d supply, fill #0

## 2022-06-09 MED ORDER — BD LANCET ULTRAFINE 33G MISC
Freq: Every day | 3 refills | Status: AC
  Filled 2022-06-09: qty 100, 90d supply, fill #0

## 2022-06-09 MED ORDER — "PEN NEEDLES 5/16"" 31G X 8 MM MISC"
3 refills | Status: AC
  Filled 2022-06-09: qty 100, 25d supply, fill #0

## 2022-06-09 MED ORDER — KROGER BLOOD GLUCOSE TEST VI STRP
ORAL_STRIP | Freq: Every day | 12 refills | Status: AC
  Filled 2022-06-09: qty 50, 50d supply, fill #0

## 2022-06-09 MED ORDER — TADALAFIL 10 MG PO TABS
ORAL_TABLET | ORAL | 2 refills | Status: DC
  Filled 2022-06-09: qty 30, 15d supply, fill #0
  Filled 2022-08-17: qty 30, 15d supply, fill #1

## 2022-06-12 ENCOUNTER — Other Ambulatory Visit (HOSPITAL_COMMUNITY): Payer: Self-pay

## 2022-06-22 ENCOUNTER — Other Ambulatory Visit: Payer: Self-pay | Admitting: Student

## 2022-06-22 DIAGNOSIS — F1111 Opioid abuse, in remission: Secondary | ICD-10-CM

## 2022-06-23 ENCOUNTER — Other Ambulatory Visit (HOSPITAL_COMMUNITY): Payer: Self-pay

## 2022-06-26 ENCOUNTER — Other Ambulatory Visit (HOSPITAL_COMMUNITY): Payer: Self-pay

## 2022-06-26 MED ORDER — BUPRENORPHINE HCL-NALOXONE HCL 8-2 MG SL SUBL
1.0000 | SUBLINGUAL_TABLET | Freq: Three times a day (TID) | SUBLINGUAL | 0 refills | Status: DC
  Filled 2022-06-26 – 2022-07-03 (×2): qty 90, 30d supply, fill #0

## 2022-06-26 NOTE — Telephone Encounter (Signed)
Last visit 05/17/2022 with instructions to return in 4wks No future appts scheduled Message sent to attending for review-pcp unavailable Message also sent to front office for scheduling

## 2022-06-28 ENCOUNTER — Other Ambulatory Visit (HOSPITAL_COMMUNITY): Payer: Self-pay

## 2022-07-04 ENCOUNTER — Other Ambulatory Visit (HOSPITAL_COMMUNITY): Payer: Self-pay

## 2022-07-11 ENCOUNTER — Encounter: Payer: Commercial Managed Care - HMO | Admitting: Internal Medicine

## 2022-07-11 NOTE — Progress Notes (Deleted)
CC: OUD and follow up  HPI:  [name] is a 31 y.o. with medical history of T2DM, Asthma, OUD presenting to [facility] for OUD and DMII follow up.   Please see problem-based list for further details, assessments, and plans.  Past Medical History:  Diagnosis Date   Asthma    HTN (hypertension) 07/25/2017   Lisinopril 63m Daily   Hyperglycemia 09/24/2017   [place]    "dx'd 06/2017"    Current Outpatient Medications (Endocrine & Metabolic):    dapagliflozin propanediol (FARXIGA) 5 MG TABS tablet, Take 1 tablet (5 mg total) by mouth daily.   Dulaglutide (TRULICITY) 07.82MNF/6.2ZHSOPN, Inject 0.75 mg into the skin once a week.   Dulaglutide (TRULICITY) 00.86MVH/8.4ONSOPN, Inject 0.75 mg into the skin once a week.   Insulin Glargine (BASAGLAR KWIKPEN) 100 UNIT/ML, Inject 28 Units into the skin daily.   Insulin Glargine (BASAGLAR KWIKPEN) 100 UNIT/ML, Inject 28 Units into the skin daily.   insulin lispro (HUMALOG KWIKPEN) 100 UNIT/ML KwikPen, Inject 10 Units into the skin 3 (three) times daily.  Current Outpatient Medications (Cardiovascular):    sildenafil (VIAGRA) 50 MG tablet, Take 1 tablet (50 mg total) by mouth as needed for erectile dysfunction. Take 1 tablet as needed 1 hour before sexual activity.  You can increase it to a maximum dose of 100 mg once a day if there is incomplete response.   tadalafil (CIALIS) 10 MG tablet, Take 2 tablets by mouth daily as needed for sexual activity; administer approximately 361m before sexual activity; do not use more than 1 dose per 24hrs   Current Outpatient Medications (Analgesics):    buprenorphine-naloxone (SUBOXONE) 8-2 mg SUBL SL tablet, Place 1 tablet under the tongue 3 (three) times daily.   Current Outpatient Medications (Other):    B-D ULTRA-FINE 33 LANCETS MISC, Use one lancet once daily.   blood glucose meter kit and supplies KIT, Use up to four times daily as directed.   Blood Glucose Monitoring  Suppl (ONETOUCH VERIO) w/Device KIT, Use once daily.   Blood Glucose Monitoring Suppl (TRUE METRIX METER) w/Device KIT, USE AS DIRECTED   Continuous Blood Gluc Sensor (DEXCOM G7 SENSOR) MISC, Apply 1 sensor every 10 days   DULoxetine (CYMBALTA) 60 MG capsule, Take 1 capsule (60 mg total) by mouth daily.   gabapentin (NEURONTIN) 100 MG capsule, Take 1 capsule (100 mg total) by mouth 3 (three) times daily.   glucose blood (FREESTYLE LITE) test strip, use up to 4 times daily to check blood sugar   glucose blood ([employer] BLOOD GLUCOSE TEST) test strip, Use to test once daily.   glucose blood test strip, 1 each by Other route 4 (four) times daily. Use as instructed   Insulin Pen Needle (PEN NEEDLES 31GX5/16") 31G X 8 MM MISC, Use 1 (one) Syringe as directed   Insulin Pen Needle (UNIFINE PENTIPS) 32G X 4 MM MISC, Use 4 times daily   Lancets (FREESTYLE) lancets, use up to 4 times daily to check blood sugar  Review of Systems:  Review of system negative unless stated in the problem list or HPI.    Physical Exam:  There were no vitals filed for this visit.  Physical Exam General: NAD HENT: NCAT Lungs: CTAB, no wheeze, rhonchi or rales.  Cardiovascular: Normal heart sounds, no r/m/g, 2+ pulses in all extremities. No LE edema Abdomen: No TTP, normal bowel sounds MSK: No asymmetry or muscle atrophy.  Skin: no lesions noted on exposed skin  Neuro: Alert and oriented x4. CN grossly intact Psych: Normal mood and normal affect   Assessment & Plan:   No problem-specific Assessment & Plan notes found for this encounter.   See Encounters Tab for problem based charting.  Patient discussed with Dr. {NAMES:[id number]::"[name]","[name]","[name]","[name]","[name]","[name]","[name]","[name]"} Roger Schuller, MD [name]. [facility] Res[facility], PGY-2   DMII On Trulicity 5.14 mg weekly, Insulin glargine 28 units, and humalog 10 units TID 04/2022 A1c 9.1  OUD Utox  On  Suboxone 8-2 TID

## 2022-08-08 ENCOUNTER — Other Ambulatory Visit (HOSPITAL_COMMUNITY): Payer: Self-pay

## 2022-08-08 ENCOUNTER — Other Ambulatory Visit: Payer: Self-pay | Admitting: Internal Medicine

## 2022-08-08 DIAGNOSIS — F1111 Opioid abuse, in remission: Secondary | ICD-10-CM

## 2022-08-08 MED ORDER — BUPRENORPHINE HCL-NALOXONE HCL 8-2 MG SL SUBL
1.0000 | SUBLINGUAL_TABLET | Freq: Three times a day (TID) | SUBLINGUAL | 0 refills | Status: DC
  Filled 2022-08-08: qty 90, 30d supply, fill #0

## 2022-08-08 NOTE — Telephone Encounter (Signed)
Please have this patient schedule OUD follow up. Thanks.

## 2022-08-08 NOTE — Telephone Encounter (Signed)
LOV 05/14/22. No-show 07/11/22.

## 2022-08-09 ENCOUNTER — Other Ambulatory Visit (HOSPITAL_COMMUNITY): Payer: Self-pay

## 2022-08-18 ENCOUNTER — Other Ambulatory Visit (HOSPITAL_COMMUNITY): Payer: Self-pay

## 2022-08-28 ENCOUNTER — Other Ambulatory Visit (HOSPITAL_COMMUNITY): Payer: Self-pay

## 2022-09-05 ENCOUNTER — Other Ambulatory Visit (HOSPITAL_COMMUNITY): Payer: Self-pay

## 2022-09-26 ENCOUNTER — Other Ambulatory Visit: Payer: Self-pay | Admitting: Internal Medicine

## 2022-09-26 DIAGNOSIS — F1111 Opioid abuse, in remission: Secondary | ICD-10-CM

## 2022-09-27 NOTE — Telephone Encounter (Signed)
Last Appointment 07/11/2022.  No future appointments.  Last ToxAssure 05/17/2022.

## 2022-09-28 ENCOUNTER — Other Ambulatory Visit (HOSPITAL_COMMUNITY): Payer: Self-pay

## 2022-09-28 MED ORDER — BUPRENORPHINE HCL-NALOXONE HCL 8-2 MG SL SUBL
1.0000 | SUBLINGUAL_TABLET | Freq: Three times a day (TID) | SUBLINGUAL | 0 refills | Status: DC
  Filled 2022-09-28: qty 90, 30d supply, fill #0

## 2022-09-29 ENCOUNTER — Other Ambulatory Visit (HOSPITAL_COMMUNITY): Payer: Self-pay

## 2022-11-03 ENCOUNTER — Other Ambulatory Visit (HOSPITAL_COMMUNITY): Payer: Self-pay

## 2022-11-05 ENCOUNTER — Other Ambulatory Visit: Payer: Self-pay | Admitting: Student

## 2022-11-05 DIAGNOSIS — F1111 Opioid abuse, in remission: Secondary | ICD-10-CM

## 2022-11-06 MED ORDER — BUPRENORPHINE HCL-NALOXONE HCL 8-2 MG SL SUBL
1.0000 | SUBLINGUAL_TABLET | Freq: Three times a day (TID) | SUBLINGUAL | 0 refills | Status: DC
  Filled 2022-11-06: qty 90, 30d supply, fill #0

## 2022-11-07 ENCOUNTER — Other Ambulatory Visit (HOSPITAL_COMMUNITY): Payer: Self-pay

## 2022-12-24 ENCOUNTER — Other Ambulatory Visit: Payer: Self-pay | Admitting: Student

## 2022-12-24 DIAGNOSIS — F1111 Opioid abuse, in remission: Secondary | ICD-10-CM

## 2022-12-26 ENCOUNTER — Other Ambulatory Visit (HOSPITAL_COMMUNITY): Payer: Self-pay

## 2022-12-26 MED ORDER — BUPRENORPHINE HCL-NALOXONE HCL 8-2 MG SL SUBL
1.0000 | SUBLINGUAL_TABLET | Freq: Three times a day (TID) | SUBLINGUAL | 0 refills | Status: DC
  Filled 2022-12-26: qty 90, 30d supply, fill #0

## 2022-12-26 NOTE — Telephone Encounter (Signed)
Last rx written 11/06/22. LOV 05/17/22.

## 2023-02-01 ENCOUNTER — Ambulatory Visit (INDEPENDENT_AMBULATORY_CARE_PROVIDER_SITE_OTHER): Payer: Commercial Managed Care - HMO | Admitting: Podiatry

## 2023-02-01 DIAGNOSIS — L84 Corns and callosities: Secondary | ICD-10-CM | POA: Diagnosis not present

## 2023-02-01 DIAGNOSIS — M79675 Pain in left toe(s): Secondary | ICD-10-CM

## 2023-02-01 DIAGNOSIS — B351 Tinea unguium: Secondary | ICD-10-CM | POA: Diagnosis not present

## 2023-02-01 DIAGNOSIS — M79674 Pain in right toe(s): Secondary | ICD-10-CM

## 2023-02-01 DIAGNOSIS — E1142 Type 2 diabetes mellitus with diabetic polyneuropathy: Secondary | ICD-10-CM | POA: Diagnosis not present

## 2023-02-01 NOTE — Progress Notes (Signed)
  Subjective:  Patient ID: [name], male    DOB: 07/28/1991,  MRN: [id number]  Chief Complaint  Patient presents with   Nail Problem    DFC A1C-7.0    32 y.o. male presents with the above complaint. History confirmed with patient. Patient presenting with pain related to dystrophic thickened elongated nails. Patient is unable to trim own nails related to nail dystrophy and/or mobility issues.  Patient does  have a history of T2DM. Patient does/does not have callus present located at the bilateral plantar 5th met head and medial aspect right hallux causing pain.   Objective:  Physical Exam: warm, good capillary refill nail exam onychomycosis of the toenails, onycholysis, and dystrophic nails protective sensation absent DP and PT pulses nonpalpable Left Foot:  Pain with palpation of nails due to elongation and dystrophic growth.  Keratotic lesion subfifth metatarsal head. Right Foot: Pain with palpation of nails due to elongation and dystrophic growth.  Hyperkeratotic lesion subfifth metatarsal head.  Hyperkeratotic lesion with subcutaneous fibrotic slough at the medial aspect of the right hallux.  Maceration of the underlying tissue but no direct open ulceration.  Assessment:   1. Pain due to onychomycosis of toenails of both feet   2. Pre-ulcerative calluses   3. DM type 2 with diabetic peripheral neuropathy      Plan:  Patient was evaluated and treated and all questions answered.  Patient educated on diabetes. Discussed proper diabetic foot care and discussed risks and complications of disease. Educated patient in depth on reasons to return to the office immediately should he/she discover anything concerning or new on the feet. All questions answered. Discussed proper shoes as well.  -Patient will need ABI PVR testing in regards to her abnormal pedal pulse exam unable to palpate DP or PT pulses  #Hyperkeratotic lesions/pre ulcerative calluses present subfifth metatarsal head  bilaterally and medial aspect of the right hallux All symptomatic hyperkeratoses x 3 separate lesions were safely debrided with a sterile #10 blade to patient's level of comfort without incident. We discussed preventative and palliative care of these lesions including supportive and accommodative shoegear, padding, prefabricated and custom molded accommodative orthoses, use of a pumice stone and lotions/creams daily.  #Onychomycosis with pain  -Nails palliatively debrided as below. -Educated on self-care  Procedure: Nail Debridement Rationale: Pain Type of Debridement: manual, sharp debridement. Instrumentation: Nail nipper, rotary burr. Number of Nails: 10  Return in about 4 weeks (around 03/01/2023) for f/u R hallux callus.         Everitt Amber, DPM [facility]

## 2023-02-05 ENCOUNTER — Ambulatory Visit (HOSPITAL_COMMUNITY): Payer: Commercial Managed Care - HMO | Attending: Podiatry

## 2023-02-15 ENCOUNTER — Other Ambulatory Visit: Payer: Self-pay | Admitting: Student

## 2023-02-15 DIAGNOSIS — F1111 Opioid abuse, in remission: Secondary | ICD-10-CM

## 2023-02-15 MED ORDER — BUPRENORPHINE HCL-NALOXONE HCL 8-2 MG SL SUBL
1.0000 | SUBLINGUAL_TABLET | Freq: Three times a day (TID) | SUBLINGUAL | 0 refills | Status: DC
Start: 2023-02-15 — End: 2023-04-05
  Filled 2023-02-15: qty 90, 30d supply, fill #0

## 2023-02-16 ENCOUNTER — Other Ambulatory Visit (HOSPITAL_COMMUNITY): Payer: Self-pay

## 2023-02-16 ENCOUNTER — Other Ambulatory Visit: Payer: Self-pay

## 2023-02-21 ENCOUNTER — Other Ambulatory Visit (HOSPITAL_COMMUNITY): Payer: Self-pay

## 2023-02-21 ENCOUNTER — Encounter: Payer: Commercial Managed Care - HMO | Admitting: Student

## 2023-02-21 ENCOUNTER — Other Ambulatory Visit: Payer: Self-pay | Admitting: Student

## 2023-02-21 DIAGNOSIS — E109 Type 1 diabetes mellitus without complications: Secondary | ICD-10-CM

## 2023-02-27 ENCOUNTER — Other Ambulatory Visit (HOSPITAL_COMMUNITY): Payer: Self-pay

## 2023-03-01 ENCOUNTER — Ambulatory Visit (INDEPENDENT_AMBULATORY_CARE_PROVIDER_SITE_OTHER): Payer: Commercial Managed Care - HMO | Admitting: Podiatry

## 2023-03-01 DIAGNOSIS — Z91199 Patient's noncompliance with other medical treatment and regimen due to unspecified reason: Secondary | ICD-10-CM

## 2023-03-01 NOTE — Progress Notes (Signed)
Pt was a no show for apt, CG 

## 2023-03-20 ENCOUNTER — Other Ambulatory Visit (HOSPITAL_COMMUNITY): Payer: Self-pay

## 2023-03-20 MED ORDER — BASAGLAR KWIKPEN 100 UNIT/ML ~~LOC~~ SOPN
20.0000 [IU] | PEN_INJECTOR | Freq: Every day | SUBCUTANEOUS | 2 refills | Status: DC
  Filled 2023-03-20: qty 18, 90d supply, fill #0

## 2023-03-20 MED ORDER — OZEMPIC (0.25 OR 0.5 MG/DOSE) 2 MG/3ML ~~LOC~~ SOPN
0.2500 mg | PEN_INJECTOR | SUBCUTANEOUS | 5 refills | Status: DC
  Filled 2023-03-20: qty 3, 28d supply, fill #0

## 2023-03-26 ENCOUNTER — Encounter: Payer: Self-pay | Admitting: *Deleted

## 2023-04-05 ENCOUNTER — Other Ambulatory Visit (HOSPITAL_COMMUNITY): Payer: Self-pay

## 2023-04-05 ENCOUNTER — Other Ambulatory Visit: Payer: Self-pay | Admitting: Student

## 2023-04-05 DIAGNOSIS — F1111 Opioid abuse, in remission: Secondary | ICD-10-CM

## 2023-04-05 MED ORDER — BUPRENORPHINE HCL-NALOXONE HCL 8-2 MG SL SUBL
1.0000 | SUBLINGUAL_TABLET | Freq: Three times a day (TID) | SUBLINGUAL | 0 refills | Status: DC
Start: 2023-04-05 — End: 2023-04-16
  Filled 2023-04-05: qty 45, 15d supply, fill #0

## 2023-04-05 NOTE — Telephone Encounter (Signed)
LOV was 05/17/22. I called pt to schedule an appt  and informed pt to keep his appt; stated he understands. Call transferred to front office - appt schedule w/Dr [facility] Tuesday 6/11.

## 2023-04-05 NOTE — Telephone Encounter (Signed)
Pt has not had appointment for OUD since 04/2022, he has an appointment scheduled with Dr. Cliffton Asters for next week. Will refill a 2 week supply and if feasible and toxassure within normal limits can extend.

## 2023-04-09 ENCOUNTER — Other Ambulatory Visit (HOSPITAL_COMMUNITY): Payer: Self-pay

## 2023-04-16 ENCOUNTER — Other Ambulatory Visit (HOSPITAL_COMMUNITY): Payer: Self-pay

## 2023-04-16 ENCOUNTER — Other Ambulatory Visit: Payer: Self-pay

## 2023-04-16 ENCOUNTER — Ambulatory Visit (INDEPENDENT_AMBULATORY_CARE_PROVIDER_SITE_OTHER): Payer: Commercial Managed Care - HMO

## 2023-04-16 VITALS — BP 123/77 | HR 87 | Temp 98.4°F | Wt 187.9 lb

## 2023-04-16 DIAGNOSIS — F1111 Opioid abuse, in remission: Secondary | ICD-10-CM | POA: Diagnosis not present

## 2023-04-16 DIAGNOSIS — Z7985 Long-term (current) use of injectable non-insulin antidiabetic drugs: Secondary | ICD-10-CM

## 2023-04-16 DIAGNOSIS — E109 Type 1 diabetes mellitus without complications: Secondary | ICD-10-CM

## 2023-04-16 DIAGNOSIS — Z794 Long term (current) use of insulin: Secondary | ICD-10-CM

## 2023-04-16 DIAGNOSIS — E1110 Type 2 diabetes mellitus with ketoacidosis without coma: Secondary | ICD-10-CM

## 2023-04-16 LAB — GLUCOSE, CAPILLARY: Glucose-Capillary: 206 mg/dL — ABNORMAL HIGH (ref 70–99)

## 2023-04-16 LAB — POCT GLYCOSYLATED HEMOGLOBIN (HGB A1C): Hemoglobin A1C: 11 % — AB (ref 4.0–5.6)

## 2023-04-16 MED ORDER — OZEMPIC (0.25 OR 0.5 MG/DOSE) 2 MG/3ML ~~LOC~~ SOPN
0.2500 mg | PEN_INJECTOR | SUBCUTANEOUS | 5 refills | Status: DC
Start: 2023-04-16 — End: 2023-07-26
  Filled 2023-04-16: qty 3, 28d supply, fill #0

## 2023-04-16 MED ORDER — BUPRENORPHINE HCL-NALOXONE HCL 8-2 MG SL SUBL
1.0000 | SUBLINGUAL_TABLET | Freq: Three times a day (TID) | SUBLINGUAL | 0 refills | Status: DC
Start: 2023-04-16 — End: 2023-06-08
  Filled 2023-04-16: qty 90, 30d supply, fill #0

## 2023-04-16 NOTE — Patient Instructions (Addendum)
[name], it was a pleasure seeing you today!  Today we discussed: Diabetes -continue taking Basaglar 20 units daily.  Restart Ozempic 0.25 mg once a week.  Return in 1 month for follow-up.  Please bring log of fasting blood sugars. Opioid use disorder -continue Suboxone.  Refill 30-day supply.  I have ordered the following labs today:   Lab Orders         Glucose, capillary         Microalbumin / Creatinine Urine Ratio         ToxAssure Select,+Antidepr,UR         POC Hbg A1C      Tests ordered today:  none  Referrals ordered today:    Referral Orders         Ambulatory referral to Ophthalmology      I have ordered the following medication/changed the following medications:   Stop the following medications: Medications Discontinued During This Encounter  Medication Reason   Dulaglutide (TRULICITY) 0.75 MG/0.5ML SOPN Patient Preference   Dulaglutide (TRULICITY) 0.75 MG/0.5ML SOPN Patient Preference   Semaglutide,0.25 or 0.5MG /DOS, (OZEMPIC, 0.25 OR 0.5 MG/DOSE,) 2 MG/3ML SOPN Reorder   insulin lispro (HUMALOG KWIKPEN) 100 UNIT/ML KwikPen Non-compliance   Insulin Glargine (BASAGLAR KWIKPEN) 100 UNIT/ML Non-compliance   buprenorphine-naloxone (SUBOXONE) 8-2 mg SUBL SL tablet Reorder     Start the following medications: Meds ordered this encounter  Medications   Semaglutide,0.25 or 0.5MG /DOS, (OZEMPIC, 0.25 OR 0.5 MG/DOSE,) 2 MG/3ML SOPN    Sig: Inject 0.25 mg into the skin once a week.    Dispense:  3 mL    Refill:  5   buprenorphine-naloxone (SUBOXONE) 8-2 mg SUBL SL tablet    Sig: Place 1 tablet under the tongue 3 (three) times daily.    Dispense:  90 tablet    Refill:  0    IM program     Follow-up:  4 weeks    Please make sure to arrive 15 minutes prior to your next appointment. If you arrive late, you may be asked to reschedule.   We look forward to seeing you next time. Please call our clinic at [phone] if you have any questions or  concerns. The best time to call is Monday-Friday from 9am-4pm, but there is someone available 24/7. If after hours or the weekend, call the main hospital number and ask for the Internal Medicine Resident On-Call. If you need medication refills, please notify your pharmacy one week in advance and they will send [place] a request.  Thank you for letting us take part in your care. Wishing you the best!  Thank you, Adron Bene, MD

## 2023-04-16 NOTE — Assessment & Plan Note (Addendum)
Patient presents for follow-up of his type 2 diabetes.  A1c is increased from 9.1-11.0.  He has been taking Basaglar 28 units daily but this only for about a week.  Reports his blood sugars have been about 130 fasting.  He also took Ozempic in the past and reports being able to tolerate this well.  Will start this again for him.  History of right foot wound but no signs of infection on exam today.  Follows closely with podiatry. -Continue Basaglar 28 units daily -Restart Ozempic 0.25 mg weekly -Ophthalmology referral -Urine microalbumin creatinine ratio -Return in 1 month to review glucose log

## 2023-04-16 NOTE — Progress Notes (Signed)
   CC: OUD follow up   HPI:  [name] is a 32 y.o. male with past medical history as below who presents for OUD and DM follow up. Please see detailed assessment and plan for HPI.  Past Medical History:  Diagnosis Date   Asthma    HTN (hypertension) 07/25/2017   Lisinopril 40mg  Daily   Hyperglycemia 09/24/2017   Type II diabetes mellitus (HCC)    "dx'd 06/2017"   Review of Systems:  Please see detailed assessment and plan for pertinent ROS.  Physical Exam:  Vitals:   04/16/23 1519  BP: 123/77  Pulse: 87  Temp: 98.4 F (36.9 C)  TempSrc: Oral  SpO2: 100%  Weight: 187 lb 14.4 oz (85.2 kg)   Physical Exam Constitutional:      General: He is not in acute distress. HENT:     Head: Normocephalic and atraumatic.  Eyes:     Extraocular Movements: Extraocular movements intact.  Cardiovascular:     Rate and Rhythm: Normal rate and regular rhythm.     Heart sounds: No murmur heard. Pulmonary:     Effort: Pulmonary effort is normal.     Breath sounds: No wheezing or rales.  Musculoskeletal:     Right lower leg: No edema.     Left lower leg: No edema.  Skin:    General: Skin is warm and dry.  Neurological:     Mental Status: He is alert.      Assessment & Plan:   See Encounters Tab for problem based charting.  Ketosis-prone diabetes mellitus (HCC) Patient presents for follow-up of his type 2 diabetes.  A1c is increased from 9.1-11.0.  He has been taking Basaglar 28 units daily but this only for about a week.  Reports his blood sugars have been about 130 fasting.  He also took Ozempic in the past and reports being able to tolerate this well.  Will start this again for him.  History of right foot wound but no signs of infection on exam today.  Follows closely with podiatry. -Continue Basaglar 28 units daily -Restart Ozempic 0.25 mg weekly -Ophthalmology referral -Urine microalbumin creatinine ratio -Return in 1 month to review glucose log  Opioid use  disorder, mild, in sustained remission, on maintenance therapy, abuse (HCC) Patient presents for follow-up of his OUD.  He has not been seen in some time for this.  PDMP reviewed and appropriate.  Tox assure last time with unexpected oxy.  Will repeat today.  He denies cravings or relapses.  Denies constipation. -Continue Suboxone 3 tablets daily -Tox assure -Follow-up in 1 month  Patient discussed with Dr. Antony Contras

## 2023-04-16 NOTE — Assessment & Plan Note (Signed)
Patient presents for follow-up of his OUD.  He has not been seen in some time for this.  PDMP reviewed and appropriate.  Tox assure last time with unexpected oxy.  Will repeat today.  He denies cravings or relapses.  Denies constipation. -Continue Suboxone 3 tablets daily -Tox assure -Follow-up in 1 month

## 2023-04-17 LAB — MICROALBUMIN / CREATININE URINE RATIO
Creatinine, Urine: 55 mg/dL
Microalb/Creat Ratio: <5 mg/g creat (ref 0–29)
Microalbumin, Urine: <3 ug/mL

## 2023-04-19 LAB — TOXASSURE SELECT,+ANTIDEPR,UR

## 2023-04-20 ENCOUNTER — Other Ambulatory Visit (HOSPITAL_COMMUNITY): Payer: Self-pay

## 2023-04-27 NOTE — Progress Notes (Signed)
Internal Medicine Clinic Attending  Case discussed with Dr. White  At the time of the visit.  We reviewed the resident's history and exam and pertinent patient test results.  I agree with the assessment, diagnosis, and plan of care documented in the resident's note.  

## 2023-05-18 ENCOUNTER — Ambulatory Visit (HOSPITAL_COMMUNITY)
Admission: EM | Admit: 2023-05-18 | Discharge: 2023-05-18 | Disposition: A | Discharge status: Home / Self Care | Payer: Commercial Managed Care - HMO | Source: Home / Self Care

## 2023-05-22 ENCOUNTER — Other Ambulatory Visit: Payer: Self-pay

## 2023-05-22 ENCOUNTER — Emergency Department (HOSPITAL_COMMUNITY): Payer: Commercial Managed Care - HMO

## 2023-05-22 ENCOUNTER — Inpatient Hospital Stay (HOSPITAL_COMMUNITY)
Admission: EM | Admit: 2023-05-22 | Discharge: 2023-05-24 | DRG: 709 | Discharge status: Against Medical Advice | Payer: Commercial Managed Care - HMO | Attending: Internal Medicine | Admitting: Internal Medicine

## 2023-05-22 DIAGNOSIS — Z885 Allergy status to narcotic agent status: Secondary | ICD-10-CM | POA: Diagnosis not present

## 2023-05-22 DIAGNOSIS — N4829 Other inflammatory disorders of penis: Secondary | ICD-10-CM | POA: Diagnosis not present

## 2023-05-22 DIAGNOSIS — F1110 Opioid abuse, uncomplicated: Secondary | ICD-10-CM | POA: Diagnosis present

## 2023-05-22 DIAGNOSIS — I10 Essential (primary) hypertension: Secondary | ICD-10-CM | POA: Diagnosis present

## 2023-05-22 DIAGNOSIS — N529 Male erectile dysfunction, unspecified: Secondary | ICD-10-CM | POA: Diagnosis present

## 2023-05-22 DIAGNOSIS — E871 Hypo-osmolality and hyponatremia: Secondary | ICD-10-CM | POA: Diagnosis present

## 2023-05-22 DIAGNOSIS — Z7984 Long term (current) use of oral hypoglycemic drugs: Secondary | ICD-10-CM | POA: Diagnosis not present

## 2023-05-22 DIAGNOSIS — Z794 Long term (current) use of insulin: Secondary | ICD-10-CM

## 2023-05-22 DIAGNOSIS — Z79891 Long term (current) use of opiate analgesic: Secondary | ICD-10-CM

## 2023-05-22 DIAGNOSIS — E739 Lactose intolerance, unspecified: Secondary | ICD-10-CM | POA: Diagnosis present

## 2023-05-22 DIAGNOSIS — J45909 Unspecified asthma, uncomplicated: Secondary | ICD-10-CM | POA: Diagnosis present

## 2023-05-22 DIAGNOSIS — E1165 Type 2 diabetes mellitus with hyperglycemia: Secondary | ICD-10-CM | POA: Diagnosis present

## 2023-05-22 DIAGNOSIS — F17210 Nicotine dependence, cigarettes, uncomplicated: Secondary | ICD-10-CM | POA: Diagnosis present

## 2023-05-22 DIAGNOSIS — Z79899 Other long term (current) drug therapy: Secondary | ICD-10-CM

## 2023-05-22 DIAGNOSIS — N4821 Abscess of corpus cavernosum and penis: Secondary | ICD-10-CM | POA: Diagnosis not present

## 2023-05-22 DIAGNOSIS — F1190 Opioid use, unspecified, uncomplicated: Secondary | ICD-10-CM | POA: Insufficient documentation

## 2023-05-22 LAB — BASIC METABOLIC PANEL
Anion gap: 10 (ref 5–15)
BUN: 8 mg/dL (ref 6–20)
CO2: 27 mmol/L (ref 22–32)
Calcium: 8.8 mg/dL — ABNORMAL LOW (ref 8.9–10.3)
Chloride: 95 mmol/L — ABNORMAL LOW (ref 98–111)
Creatinine, Ser: 0.75 mg/dL (ref 0.61–1.24)
GFR, Estimated: >60 mL/min (ref >60)
Glucose, Bld: 338 mg/dL — ABNORMAL HIGH (ref 70–99)
Potassium: 4.1 mmol/L (ref 3.5–5.1)
Sodium: 132 mmol/L — ABNORMAL LOW (ref 135–145)

## 2023-05-22 LAB — HIV ANTIBODY (ROUTINE TESTING W REFLEX): HIV Screen 4th Generation wRfx: NONREACTIVE

## 2023-05-22 LAB — URINALYSIS, ROUTINE W REFLEX MICROSCOPIC
Bacteria, UA: NONE SEEN
Bilirubin Urine: NEGATIVE
Glucose, UA: >=500 mg/dL — AB
Ketones, ur: NEGATIVE mg/dL
Leukocytes,Ua: NEGATIVE
Nitrite: NEGATIVE
Protein, ur: NEGATIVE mg/dL
Specific Gravity, Urine: 1.025 (ref 1.005–1.030)
pH: 6 (ref 5.0–8.0)

## 2023-05-22 LAB — CBC
HCT: 42.9 % (ref 39.0–52.0)
Hemoglobin: 14.9 g/dL (ref 13.0–17.0)
MCH: 29.4 pg (ref 26.0–34.0)
MCHC: 34.7 g/dL (ref 30.0–36.0)
MCV: 84.8 fL (ref 80.0–100.0)
Platelets: 342 10*3/uL (ref 150–400)
RBC: 5.06 MIL/uL (ref 4.22–5.81)
RDW: 11.3 % — ABNORMAL LOW (ref 11.5–15.5)
WBC: 9.1 10*3/uL (ref 4.0–10.5)
nRBC: 0 % (ref 0.0–0.2)

## 2023-05-22 LAB — GLUCOSE, CAPILLARY: Glucose-Capillary: 360 mg/dL — ABNORMAL HIGH (ref 70–99)

## 2023-05-22 MED ORDER — PIPERACILLIN-TAZOBACTAM 3.375 G IVPB
3.3750 g | Freq: Three times a day (TID) | INTRAVENOUS | Status: DC
  Administered 2023-05-23 – 2023-05-24 (×5): 3.375 g via INTRAVENOUS
  Filled 2023-05-22 (×5): qty 50

## 2023-05-22 MED ORDER — INSULIN ASPART 100 UNIT/ML IJ SOLN
0.0000 [IU] | INTRAMUSCULAR | Status: DC
  Administered 2023-05-22: 9 [IU] via SUBCUTANEOUS
  Administered 2023-05-23: 7 [IU] via SUBCUTANEOUS
  Administered 2023-05-23: 1 [IU] via SUBCUTANEOUS
  Administered 2023-05-23 (×2): 2 [IU] via SUBCUTANEOUS
  Administered 2023-05-23: 7 [IU] via SUBCUTANEOUS
  Administered 2023-05-24: 1 [IU] via SUBCUTANEOUS
  Administered 2023-05-24: 7 [IU] via SUBCUTANEOUS
  Administered 2023-05-24 (×2): 3 [IU] via SUBCUTANEOUS
  Filled 2023-05-22: qty 0.09

## 2023-05-22 MED ORDER — INSULIN GLARGINE-YFGN 100 UNIT/ML ~~LOC~~ SOLN
14.0000 [IU] | Freq: Every day | SUBCUTANEOUS | Status: DC
  Administered 2023-05-22 – 2023-05-24 (×2): 14 [IU] via SUBCUTANEOUS
  Filled 2023-05-22 (×3): qty 0.14

## 2023-05-22 MED ORDER — PIPERACILLIN-TAZOBACTAM 3.375 G IVPB 30 MIN
3.3750 g | Freq: Once | INTRAVENOUS | Status: AC
  Administered 2023-05-22: 3.375 g via INTRAVENOUS
  Filled 2023-05-22: qty 50

## 2023-05-22 MED ORDER — SODIUM CHLORIDE 0.9 % IV SOLN: INTRAVENOUS | Status: AC

## 2023-05-22 MED ORDER — VANCOMYCIN HCL 2000 MG/400ML IV SOLN
2000.0000 mg | Freq: Once | INTRAVENOUS | Status: AC
  Administered 2023-05-22: 2000 mg via INTRAVENOUS
  Filled 2023-05-22 (×2): qty 400

## 2023-05-22 MED ORDER — IBUPROFEN 200 MG PO TABS
400.0000 mg | ORAL_TABLET | Freq: Four times a day (QID) | ORAL | Status: DC | PRN
  Administered 2023-05-23 – 2023-05-24 (×3): 400 mg via ORAL
  Filled 2023-05-22 (×3): qty 2

## 2023-05-22 MED ORDER — VANCOMYCIN HCL 1250 MG/250ML IV SOLN
1250.0000 mg | Freq: Two times a day (BID) | INTRAVENOUS | Status: DC
  Administered 2023-05-23 – 2023-05-24 (×3): 1250 mg via INTRAVENOUS
  Filled 2023-05-22 (×3): qty 250

## 2023-05-22 MED ORDER — IOHEXOL 300 MG/ML  SOLN
100.0000 mL | Freq: Once | INTRAMUSCULAR | Status: AC | PRN
  Administered 2023-05-22: 100 mL via INTRAVENOUS

## 2023-05-22 NOTE — H&P (Signed)
History and Physical    [name] ZOX:[id number] DOB: 1991-03-18 DOA: 05/22/2023  PCP: Olegario Messier, MD  Patient coming from: Home  Chief Complaint: Swelling of groin and penis  HPI: [name] is a 32 y.o. male with medical history significant of insulin-dependent type 2 diabetes, hypertension, asthma, opioid use disorder on Suboxone, erectile dysfunction presented to ED with swelling of his groin and penis in the setting of using a self injection of Trimix which was prescribed for erectile dysfunction and involves an injection to the base of the penis.  Vital signs stable.  Labs showing no leukocytosis, sodium 132, chloride 95, glucose 338, bicarb 27, anion gap 10, creatinine 0.7, UA negative for ketones or signs of infection, HIV antibody nonreactive, GC chlamydia probe pending, RPR pending.  CT pelvis with contrast showing multiloculated abscess at the base of the penis on the right.  Urology saw the patient in the ED and recommended admission for IV antibiotics (Vanc + Zosyn), aggressive glycemic management, and will plan for operative I&D/drain placement tomorrow.  Recommended keeping n.p.o. at midnight.  Patient is reporting 1 week history of progressively worsening swelling of his groin and penis.  He uses Trimix injections for erectile dysfunction which involves an injection at the base of the penis where his swelling initially started.  Denies any pain or discharge from his penis.  Denies fevers or chills.  He takes Basaglar 28 units daily for his diabetes and states he was prescribed Ozempic which he has not started yet.  States his blood sugar is high because he had lemonade today and has not taken his insulin yet.  No other complaints.  Denies cough, shortness of breath, or chest pain.  Review of Systems:  Review of Systems  All other systems reviewed and are negative.   Past Medical History:  Diagnosis Date   Asthma    HTN (hypertension) 07/25/2017   Lisinopril  40mg  Daily   Hyperglycemia 09/24/2017   Type II diabetes mellitus (HCC)    "dx'd 06/2017"    Past Surgical History:  Procedure Laterality Date   ADENOIDECTOMY     INCISION AND DRAINAGE ABSCESS Right 01/24/2022   Procedure: INCISION AND DRAINAGE ABSCESS;  Surgeon: Netta Cedars, MD;  Location: [facility];  Service: Orthopedics;  Laterality: Right;   LACERATION REPAIR Left    "stitched finger up"   TONSILLECTOMY       reports that he has been smoking cigarettes. He has a 6 pack-year smoking history. He has never used smokeless tobacco. He reports current alcohol use. He reports current drug use. Drug: Marijuana.  Allergies  Allergen Reactions   Vicodin [Hydrocodone-Acetaminophen] Anaphylaxis    Tolerates tylenol with no allergy   Egg-Derived Products Nausea And Vomiting   Lactose Intolerance (Gi) Rash    No family history on file.  Prior to Admission medications   Medication Sig Start Date End Date Taking? Authorizing Provider  B-D ULTRA-FINE 33 LANCETS MISC Use one lancet once daily. 06/08/22     blood glucose meter kit and supplies KIT Use up to four times daily as directed. 05/17/22   Doran Stabler, DO  Blood Glucose Monitoring Suppl (ONETOUCH VERIO) w/Device KIT Use once daily. 06/08/22     Blood Glucose Monitoring Suppl (TRUE METRIX METER) w/Device KIT USE AS DIRECTED 06/22/21   Miguel Aschoff, MD  buprenorphine-naloxone (SUBOXONE) 8-2 mg SUBL SL tablet Place 1 tablet under the tongue 3 (three) times daily. 04/16/23   Reymundo Poll, MD  Continuous Blood Gluc Sensor (  DEXCOM G7 SENSOR) MISC Apply 1 sensor every 10 days 03/16/22   Doran Stabler, DO  dapagliflozin propanediol (FARXIGA) 5 MG TABS tablet Take 1 tablet (5 mg total) by mouth daily. 06/08/22     DULoxetine (CYMBALTA) 60 MG capsule Take 1 capsule (60 mg total) by mouth daily. 01/03/22   Carmel Sacramento, MD  gabapentin (NEURONTIN) 100 MG capsule Take 1 capsule (100 mg total) by mouth 3 (three) times daily. 01/03/22 12/29/22   Carmel Sacramento, MD  glucose blood (FREESTYLE LITE) test strip use up to 4 times daily to check blood sugar 05/17/22     glucose blood ([employer] BLOOD GLUCOSE TEST) test strip Use to test once daily. 06/08/22     glucose blood test strip 1 each by Other route 4 (four) times daily. Use as instructed 03/16/22   Doran Stabler, DO  Insulin Glargine [facility]) 100 UNIT/ML Inject 28 Units into the skin daily. 05/17/22 10/25/22  Doran Stabler, DO  Insulin Glargine [facility]) 100 UNIT/ML Inject 28 Units into the skin daily. 06/08/22     Insulin Pen Needle (PEN NEEDLES 31GX5/16") 31G X 8 MM MISC Use 1 (one) Syringe as directed 06/08/22     Insulin Pen Needle (UNIFINE PENTIPS) 32G X 4 MM MISC Use 4 times daily 03/16/22   Doran Stabler, DO  Lancets (FREESTYLE) lancets use up to 4 times daily to check blood sugar 05/17/22     Semaglutide,0.25 or 0.5MG /DOS, (OZEMPIC, 0.25 OR 0.5 MG/DOSE,) 2 MG/3ML SOPN Inject 0.25 mg into the skin once a week. 04/16/23   Adron Bene, MD  sildenafil (VIAGRA) 50 MG tablet Take 1 tablet (50 mg total) by mouth as needed for erectile dysfunction. Take 1 tablet as needed 1 hour before sexual activity.  You can increase it to a maximum dose of 100 mg once a day if there is incomplete response. 05/17/22 05/17/23  Doran Stabler, DO  tadalafil (CIALIS) 10 MG tablet Take 2 tablets by mouth daily as needed for sexual activity; administer approximately before sexual activity; do not use more than 1 dose per 24hrs 06/08/22       Physical Exam: Vitals:   05/22/23 1225 05/22/23 1244 05/22/23 1702  BP: (!) 130/95  132/78  Pulse: 93  90  Resp: 18  16  Temp: 98 F (36.7 C)    TempSrc: Oral    SpO2: 99%  99%  Weight:  90.7 kg   Height:  5\' 6"  (1.676 m)     Physical Exam Vitals reviewed.  Constitutional:      General: He is not in acute distress. HENT:     Head: Normocephalic and atraumatic.  Eyes:     Extraocular Movements: Extraocular movements intact.  Cardiovascular:      Rate and Rhythm: Normal rate and regular rhythm.     Pulses: Normal pulses.  Pulmonary:     Effort: Pulmonary effort is normal. No respiratory distress.     Breath sounds: Normal breath sounds. No wheezing or rales.  Abdominal:     General: Bowel sounds are normal. There is no distension.     Palpations: Abdomen is soft.     Tenderness: There is no abdominal tenderness. There is no guarding.  Musculoskeletal:     Cervical back: Normal range of motion.     Right lower leg: No edema.     Left lower leg: No edema.  Skin:    General: Skin is warm and dry.  Neurological:     General: No  focal deficit present.     Mental Status: He is alert and oriented to person, place, and time.     Labs on Admission: I have personally reviewed following labs and imaging studies  CBC: Recent Labs  Lab 05/22/23 1716  WBC 9.1  HGB 14.9  HCT 42.9  MCV 84.8  PLT 342   Basic Metabolic Panel: Recent Labs  Lab 05/22/23 1716  NA 132*  K 4.1  CL 95*  CO2 27  GLUCOSE 338*  BUN 8  CREATININE 0.75  CALCIUM 8.8*   GFR: Estimated Creatinine Clearance: 142.5 mL/min (by C-G formula based on SCr of 0.75 mg/dL). Liver Function Tests: No results for input(s): "AST", "ALT", "ALKPHOS", "BILITOT", "PROT", "ALBUMIN" in the last 168 hours. No results for input(s): "LIPASE", "AMYLASE" in the last 168 hours. No results for input(s): "AMMONIA" in the last 168 hours. Coagulation Profile: No results for input(s): "INR", "PROTIME" in the last 168 hours. Cardiac Enzymes: No results for input(s): "CKTOTAL", "CKMB", "CKMBINDEX", "TROPONINI" in the last 168 hours. BNP (last 3 results) No results for input(s): "PROBNP" in the last 8760 hours. HbA1C: No results for input(s): "HGBA1C" in the last 72 hours. CBG: No results for input(s): "GLUCAP" in the last 168 hours. Lipid Profile: No results for input(s): "CHOL", "HDL", "LDLCALC", "TRIG", "CHOLHDL", "LDLDIRECT" in the last 72 hours. Thyroid Function  Tests: No results for input(s): "TSH", "T4TOTAL", "FREET4", "T3FREE", "THYROIDAB" in the last 72 hours. Anemia Panel: No results for input(s): "VITAMINB12", "FOLATE", "FERRITIN", "TIBC", "IRON", "RETICCTPCT" in the last 72 hours. Urine analysis:    Component Value Date/Time   COLORURINE YELLOW 05/22/2023 1718   APPEARANCEUR CLEAR 05/22/2023 1718   LABSPEC 1.025 05/22/2023 1718   PHURINE 6.0 05/22/2023 1718   GLUCOSEU >=500 (A) 05/22/2023 1718   HGBUR MODERATE (A) 05/22/2023 1718   BILIRUBINUR NEGATIVE 05/22/2023 1718   KETONESUR NEGATIVE 05/22/2023 1718   PROTEINUR NEGATIVE 05/22/2023 1718   NITRITE NEGATIVE 05/22/2023 1718   LEUKOCYTESUR NEGATIVE 05/22/2023 1718    Radiological Exams on Admission: CT PELVIS W CONTRAST  Result Date: 05/22/2023 CLINICAL DATA:  Soft tissue swelling over mons pubis with penile swelling and ulceration, initial encounter EXAM: CT PELVIS WITH CONTRAST TECHNIQUE: Multidetector CT imaging of the pelvis was performed using the standard protocol following the bolus administration of intravenous contrast. RADIATION DOSE REDUCTION: This exam was performed according to the departmental dose-optimization program which includes automated exposure control, adjustment of the mA and/or kV according to patient size and/or use of iterative reconstruction technique. CONTRAST:  OMNIPAQUE IOHEXOL 300 MG/ML  SOLN COMPARISON:  None Available. FINDINGS: Urinary Tract: Bladder is partially distended. Some wall thickening is noted likely related to incomplete distension. Bowel: No obstructive or inflammatory changes of the visualized bowel are seen. Vascular/Lymphatic: Variant anatomy with a left-sided IVC is noted. Reproductive: Prostate is within normal limits. There is a 5.3 x 3.9 cm enhancing fluid collection identified along the right lateral aspect of the penile shaft at its base consistent with a focal abscess. This extends for approximately 5 cm in craniocaudad projection.  Overlying soft tissue edema is noted. No skin wound or drainage is identified. Reactive bilateral inguinal adenopathy is noted. Other:  No free fluid is noted. Musculoskeletal: No bony abnormality is noted. IMPRESSION: Changes consistent with multiloculated abscess at the base of the penis on the right. This does not appear to involve the corpora cavernosa. Electronically Signed   By: Alcide Clever M.D.   On: 05/22/2023 19:51    Assessment  and Plan  Penile abscess In the setting of patient using penile injections for severe erectile dysfunction refractory to p.o. meds and secondary to severe diabetes.  No fever, leukocytosis, or signs of sepsis. CT pelvis with contrast showing multiloculated abscess at the base of the penis on the right.  Urology consulted and planning on taking the patient to the OR in the morning for I&D/drain placement.  Continue vancomycin and Zosyn, aggressive glycemic management.  Keep n.p.o. after midnight.  Insulin-dependent type 2 diabetes Poorly controlled-A1c 11.0 on 04/16/2023.  Glucose in the 300s without signs of DKA.  He takes Basaglar 28 units daily, placed on 14 units daily for now as he is n.p.o. for surgery.  Also placed on sensitive sliding scale insulin every 4 hours.  He will need close follow-up with his PCP for management of his diabetes.  Hypertension Stable, not on antihypertensives at home.  Asthma Stable, no signs of acute exacerbation.  Not on inhalers at home.  Opioid use disorder Continue Suboxone.  DVT prophylaxis: SCDs Code Status: Full Code (discussed with the patient) Level of care: Med-Surg Admission status: It is my clinical opinion that admission to INPATIENT is reasonable and necessary because of the expectation that this patient will require hospital care that crosses at least 2 midnights to treat this condition based on the medical complexity of the problems presented.  Given the aforementioned information, the predictability of an adverse  outcome is felt to be significant.   Roger Giovanni MD [facility]  If 7PM-7AM, please contact night-coverage [email or web address]  05/22/2023, 9:53 PM

## 2023-05-22 NOTE — Progress Notes (Signed)
A consult was received from an ED provider for piperacillin/tazobactam and vancomycin per pharmacy dosing.  The patient's profile has been reviewed for ht/wt/allergies/indication/available labs.    Allergies  Allergen Reactions   Vicodin [Hydrocodone-Acetaminophen] Anaphylaxis    Tolerates tylenol with no allergy   Egg-Derived Products Nausea And Vomiting   Lactose Intolerance (Gi) Rash    A one time order has been placed for vancomycin 2000 mg IV once + piperacillin/tazobactam 3.375 g IV once to infuse over 30 minutes.    Further antibiotics/pharmacy consults should be ordered by admitting physician if indicated.                       Thank you, Cindi Carbon, PharmD 05/22/2023  8:22 PM

## 2023-05-22 NOTE — Progress Notes (Signed)
Pharmacy Antibiotic Note  [name] is a 32 y.o. male admitted on 05/22/2023 with penile abscess.  Pharmacy has been consulted for vancomycin and zosyn dosing.  Pt received 1st doses in the ED  Plan: Zosyn 3.375g IV Q8H infused over 4hrs. Vancomycin 1250mg  IV q12h (AUC 522.4, Scr 0.8) Follow renal function, cultures and clinical course  Height: 5\' 6"  (167.6 cm) Weight: 90.7 kg (200 lb) IBW/kg (Calculated) : 63.8  Temp (24hrs), Avg:98.1 F (36.7 C), Min:98 F (36.7 C), Max:98.1 F (36.7 C)  Recent Labs  Lab 05/22/23 1716  WBC 9.1  CREATININE 0.75    Estimated Creatinine Clearance: 142.5 mL/min (by C-G formula based on SCr of 0.75 mg/dL).    Allergies  Allergen Reactions   Vicodin [Hydrocodone-Acetaminophen] Anaphylaxis    Tolerates tylenol with no allergy   Egg-Derived Products Nausea And Vomiting   Lactose Intolerance (Gi) Rash    Antimicrobials this admission: 7/23 zosyn >> 7/23 vanc >>   Dose adjustments this admission:   Microbiology results:   Thank you for allowing pharmacy to be a part of this patient's care. Arley Phenix RPh 05/22/2023, 11:10 PM

## 2023-05-22 NOTE — Consult Note (Signed)
Reason for Consult:Penile Abscess, Severe Erectile Dysfunction  Referring Physician: Sederick Jacobsen is an 32 y.o. male.   HPI:   1 - Penile Abscess - large R>L penile base swelling and likely abscess 4 days after penile injection for ED. Reports some local swelling and malaize by no fevers. CT with impressive fluid collection w/o gas in Rt penile skin / mons area, some eschar on exam. Glucose 330s, WBC 9s, Cr 0.75.  2 - Severe Erectile Dysfunction - uses penile injections (trimix) for severe ED refracotry to PO meds and 2/2 severe diabetes.  PMH sig for DM2 (A1c 11s, prior cutaneous abscesses)  Today "[name]" is seen as urgent consult for above. Last meal yesterday.   Past Medical History:  Diagnosis Date   Asthma    HTN (hypertension) 07/25/2017   Lisinopril 40mg  Daily   Hyperglycemia 09/24/2017   Type II diabetes mellitus (HCC)    "dx'd 06/2017"    Past Surgical History:  Procedure Laterality Date   ADENOIDECTOMY     INCISION AND DRAINAGE ABSCESS Right 01/24/2022   Procedure: INCISION AND DRAINAGE ABSCESS;  Surgeon: Netta Cedars, MD;  Location: [facility];  Service: Orthopedics;  Laterality: Right;   LACERATION REPAIR Left    "stitched finger up"   TONSILLECTOMY      No family history on file.  Social History:  reports that he has been smoking cigarettes. He has a 6 pack-year smoking history. He has never used smokeless tobacco. He reports current alcohol use. He reports current drug use. Drug: Marijuana.  Allergies:  Allergies  Allergen Reactions   Vicodin [Hydrocodone-Acetaminophen] Anaphylaxis    Tolerates tylenol with no allergy   Egg-Derived Products Nausea And Vomiting   Lactose Intolerance (Gi) Rash    Medications: I have reviewed the patient's current medications.  Results for orders placed or performed during the hospital encounter of 05/22/23 (from the past 48 hour(s))  HIV Antibody (routine testing w rflx)     Status: None   Collection  Time: 05/22/23  5:16 PM  Result Value Ref Range   HIV Screen 4th Generation wRfx Non Reactive Non Reactive    Comment: Performed at [facility], [address] [place], [place] [address]  CBC     Status: Abnormal   Collection Time: 05/22/23  5:16 PM  Result Value Ref Range   WBC 9.1 4.0 - 10.5 K/uL   RBC 5.06 4.22 - 5.81 MIL/uL   Hemoglobin 14.9 13.0 - 17.0 g/dL   HCT 60.4 54.0 - 98.1 %   MCV 84.8 80.0 - 100.0 fL   MCH 29.4 26.0 - 34.0 pg   MCHC 34.7 30.0 - 36.0 g/dL   RDW 19.1 (L) 47.8 - 29.5 %   Platelets 342 150 - 400 K/uL   nRBC 0.0 0.0 - 0.2 %    Comment: Performed at [facility], [address] [place], [place] [address]  Basic metabolic panel     Status: Abnormal   Collection Time: 05/22/23  5:16 PM  Result Value Ref Range   Sodium 132 (L) 135 - 145 mmol/L   Potassium 4.1 3.5 - 5.1 mmol/L   Chloride 95 (L) 98 - 111 mmol/L   CO2 27 22 - 32 mmol/L   Glucose, Bld 338 (H) 70 - 99 mg/dL    Comment: Glucose reference range applies only to samples taken after fasting for at least 8 hours.   BUN 8 6 - 20 mg/dL   Creatinine, Ser  0.75 0.61 - 1.24 mg/dL   Calcium 8.8 (L) 8.9 - 10.3 mg/dL   GFR, Estimated >16 >10 mL/min    Comment: (NOTE) Calculated using the CKD-EPI Creatinine Equation (2021)    Anion gap 10 5 - 15    Comment: Performed at [facility], [address] [place], [place] [address]  Urinalysis, Routine w reflex microscopic -Urine, Clean Catch     Status: Abnormal   Collection Time: 05/22/23  5:18 PM  Result Value Ref Range   Color, Urine YELLOW YELLOW   APPearance CLEAR CLEAR   Specific Gravity, Urine 1.025 1.005 - 1.030   pH 6.0 5.0 - 8.0   Glucose, UA >=500 (A) NEGATIVE mg/dL   Hgb urine dipstick MODERATE (A) NEGATIVE   Bilirubin Urine NEGATIVE NEGATIVE   Ketones, ur NEGATIVE NEGATIVE mg/dL   Protein, ur NEGATIVE NEGATIVE mg/dL   Nitrite NEGATIVE NEGATIVE   Leukocytes,Ua NEGATIVE NEGATIVE   RBC / HPF 0-5 0 -  5 RBC/hpf   WBC, UA 0-5 0 - 5 WBC/hpf   Bacteria, UA NONE SEEN NONE SEEN   Squamous Epithelial / HPF 0-5 0 - 5 /HPF    Comment: Performed at [facility], [address] [place], [place] [address]    CT PELVIS W CONTRAST  Result Date: 05/22/2023 CLINICAL DATA:  Soft tissue swelling over mons pubis with penile swelling and ulceration, initial encounter EXAM: CT PELVIS WITH CONTRAST TECHNIQUE: Multidetector CT imaging of the pelvis was performed using the standard protocol following the bolus administration of intravenous contrast. RADIATION DOSE REDUCTION: This exam was performed according to the departmental dose-optimization program which includes automated exposure control, adjustment of the mA and/or kV according to patient size and/or use of iterative reconstruction technique. CONTRAST:  OMNIPAQUE IOHEXOL 300 MG/ML  SOLN COMPARISON:  None Available. FINDINGS: Urinary Tract: Bladder is partially distended. Some wall thickening is noted likely related to incomplete distension. Bowel: No obstructive or inflammatory changes of the visualized bowel are seen. Vascular/Lymphatic: Variant anatomy with a left-sided IVC is noted. Reproductive: Prostate is within normal limits. There is a 5.3 x 3.9 cm enhancing fluid collection identified along the right lateral aspect of the penile shaft at its base consistent with a focal abscess. This extends for approximately 5 cm in craniocaudad projection. Overlying soft tissue edema is noted. No skin wound or drainage is identified. Reactive bilateral inguinal adenopathy is noted. Other:  No free fluid is noted. Musculoskeletal: No bony abnormality is noted. IMPRESSION: Changes consistent with multiloculated abscess at the base of the penis on the right. This does not appear to involve the corpora cavernosa. Electronically Signed   By: Alcide Clever M.D.   On: 05/22/2023 19:51    Review of Systems Blood pressure 132/78, pulse 90, temperature 98  F (36.7 C), temperature source Oral, resp. rate 16, height 5\' 6"  (1.676 m), weight 90.7 kg, SpO2 99%. Physical Exam Vitals reviewed.  HENT:     Head: Normocephalic.  Eyes:     Pupils: Pupils are equal, round, and reactive to light.  Cardiovascular:     Rate and Rhythm: Normal rate.  Pulmonary:     Effort: Pulmonary effort is normal.  Abdominal:     General: Abdomen is flat.     Comments: Stigmata of prior weight loss  Genitourinary:    Comments: Area of fluctence and abtou 2/2cm eschar Rt base of penis area with some extenstion towards mons region. NO crepitus / skin breakdwon / expressable fluid.  Musculoskeletal:        General: Normal range of motion.     Cervical back: Normal range of motion.  Skin:    General: Skin is warm.  Neurological:     General: No focal deficit present.     Mental Status: He is alert.     Assessment/Plan:  Impressive soft tissue infection but w/o necrotizing process or frank sepsis. Rec admission for IV ABX, agree with vanc + zosyn for now as could be gram + or gram - or polymicrobial given GU location, aggressive glycemic management, and will plan for operative I+D / drain placement tomorrow. May require serial debridements pending severity. Risks (including soft tissue loss / poor cosmesis), benefits, alternatives, expected course discussed.   We directly discussed that this is likely 2/2 DM and encouraged glycemic control.   NPO p MN, OR likely early afternoon tomorrow.   Loletta Parish. 05/22/2023, 9:20 PM

## 2023-05-22 NOTE — ED Triage Notes (Signed)
Pt reports x1 week of discomfort and swelling to his groin and base of his penis. Pt reports giving self tri-mix shots for E.D. x1 week ago.  Has been using med x5 months without previous issue. Denies burning or discharge. On day 3 of penicillin that was laying around his house.

## 2023-05-22 NOTE — ED Notes (Signed)
Having a difficult time obtaining an IV   patient was stuck twice by myself and has been stuck by another with a failure too   he is now being attempted with an ultrasound iv

## 2023-05-22 NOTE — ED Provider Notes (Signed)
[place] EMERGENCY DEPARTMENT AT [facility] Provider Note   CSN: [id number] Arrival date & time: 05/22/23  1216     History  Chief Complaint  Patient presents with   Groin Swelling    [name] is a 32 y.o. male presents emergency department with swelling of the groin and penis.  He had onset of pain and swelling in the groin and penis for the past week.  He also used a self injection of Trimix which she is prescribed for erectile dysfunction which were involves an injection to the base of the penis.  He has been using that for 5 months.  He has had worsening swelling of the mons pubis and base of the shaft of the penis without significant pain.  He has a history of diabetes.  He complains of a large painless ulcer at the shaft of his penis.  He denies testicle pain or penile discharge.  He is never had anything like this before.  He has had recent unprotected intercourse.  HPI     Home Medications Prior to Admission medications   Medication Sig Start Date End Date Taking? Authorizing Provider  B-D ULTRA-FINE 33 LANCETS MISC Use one lancet once daily. 06/08/22     blood glucose meter kit and supplies KIT Use up to four times daily as directed. 05/17/22   Doran Stabler, DO  Blood Glucose Monitoring Suppl (ONETOUCH VERIO) w/Device KIT Use once daily. 06/08/22     Blood Glucose Monitoring Suppl (TRUE METRIX METER) w/Device KIT USE AS DIRECTED 06/22/21   Miguel Aschoff, MD  buprenorphine-naloxone (SUBOXONE) 8-2 mg SUBL SL tablet Place 1 tablet under the tongue 3 (three) times daily. 04/16/23   Reymundo Poll, MD  Continuous Blood Gluc Sensor (DEXCOM G7 SENSOR) MISC Apply 1 sensor every 10 days 03/16/22   Doran Stabler, DO  dapagliflozin propanediol (FARXIGA) 5 MG TABS tablet Take 1 tablet (5 mg total) by mouth daily. 06/08/22     DULoxetine (CYMBALTA) 60 MG capsule Take 1 capsule (60 mg total) by mouth daily. 01/03/22   Carmel Sacramento, MD  gabapentin (NEURONTIN) 100 MG  capsule Take 1 capsule (100 mg total) by mouth 3 (three) times daily. 01/03/22 12/29/22  Carmel Sacramento, MD  glucose blood (FREESTYLE LITE) test strip use up to 4 times daily to check blood sugar 05/17/22     glucose blood ([employer] BLOOD GLUCOSE TEST) test strip Use to test once daily. 06/08/22     glucose blood test strip 1 each by Other route 4 (four) times daily. Use as instructed 03/16/22   Doran Stabler, DO  Insulin Glargine [facility]) 100 UNIT/ML Inject 28 Units into the skin daily. 05/17/22 10/25/22  Doran Stabler, DO  Insulin Glargine [facility]) 100 UNIT/ML Inject 28 Units into the skin daily. 06/08/22     Insulin Pen Needle (PEN NEEDLES 31GX5/16") 31G X 8 MM MISC Use 1 (one) Syringe as directed 06/08/22     Insulin Pen Needle (UNIFINE PENTIPS) 32G X 4 MM MISC Use 4 times daily 03/16/22   Doran Stabler, DO  Lancets (FREESTYLE) lancets use up to 4 times daily to check blood sugar 05/17/22     Semaglutide,0.25 or 0.5MG /DOS, (OZEMPIC, 0.25 OR 0.5 MG/DOSE,) 2 MG/3ML SOPN Inject 0.25 mg into the skin once a week. 04/16/23   Adron Bene, MD  sildenafil (VIAGRA) 50 MG tablet Take 1 tablet (50 mg total) by mouth as needed for erectile dysfunction. Take 1 tablet as needed 1 hour before sexual  activity.  You can increase it to a maximum dose of 100 mg once a day if there is incomplete response. 05/17/22 05/17/23  Doran Stabler, DO  tadalafil (CIALIS) 10 MG tablet Take 2 tablets by mouth daily as needed for sexual activity; administer approximately before sexual activity; do not use more than 1 dose per 24hrs 06/08/22         Allergies    Vicodin [hydrocodone-acetaminophen], Egg-derived products, and Lactose intolerance (gi)    Review of Systems   Review of Systems  Physical Exam Updated Vital Signs BP 132/78 (BP Location: Left Arm)   Pulse 90   Temp 98 F (36.7 C) (Oral)   Resp 16   Ht 5\' 6"  (1.676 m)   Wt 90.7 kg   SpO2 99%   BMI 32.28 kg/m  Physical Exam Vitals and nursing note  reviewed.  Constitutional:      General: He is not in acute distress.    Appearance: He is well-developed. He is not diaphoretic.  HENT:     Head: Normocephalic and atraumatic.  Eyes:     General: No scleral icterus.    Conjunctiva/sclera: Conjunctivae normal.  Cardiovascular:     Rate and Rhythm: Normal rate and regular rhythm.     Heart sounds: Normal heart sounds.  Pulmonary:     Effort: Pulmonary effort is normal. No respiratory distress.     Breath sounds: Normal breath sounds.  Abdominal:     Palpations: Abdomen is soft.     Tenderness: There is no abdominal tenderness.  Genitourinary:    Comments: Exam chaperoned.  Swelling and erythema noted to the mons pubis and base of the shaft of the penis.  There is no discharge, no testicular pain to palpation.  There is a quarter sized painless ulcer of the shaft base on the right side.  No discharge.  Palpable lymphadenopathy noted. Musculoskeletal:     Cervical back: Normal range of motion and neck supple.  Skin:    General: Skin is warm and dry.  Neurological:     Mental Status: He is alert.  Psychiatric:        Behavior: Behavior normal.     ED Results / Procedures / Treatments   Labs (all labs ordered are listed, but only abnormal results are displayed) Labs Reviewed  URINALYSIS, ROUTINE W REFLEX MICROSCOPIC - Abnormal; Notable for the following components:      Result Value   Glucose, UA >=500 (*)    Hgb urine dipstick MODERATE (*)    All other components within normal limits  CBC - Abnormal; Notable for the following components:   RDW 11.3 (*)    All other components within normal limits  BASIC METABOLIC PANEL - Abnormal; Notable for the following components:   Sodium 132 (*)    Chloride 95 (*)    Glucose, Bld 338 (*)    Calcium 8.8 (*)    All other components within normal limits  HIV ANTIBODY (ROUTINE TESTING W REFLEX)  RPR  GC/CHLAMYDIA PROBE AMP ([place]) NOT AT [facility]    EKG None  Radiology CT  PELVIS W CONTRAST  Result Date: 05/22/2023 CLINICAL DATA:  Soft tissue swelling over mons pubis with penile swelling and ulceration, initial encounter EXAM: CT PELVIS WITH CONTRAST TECHNIQUE: Multidetector CT imaging of the pelvis was performed using the standard protocol following the bolus administration of intravenous contrast. RADIATION DOSE REDUCTION: This exam was performed according to the departmental dose-optimization program which includes automated exposure  control, adjustment of the mA and/or kV according to patient size and/or use of iterative reconstruction technique. CONTRAST:  OMNIPAQUE IOHEXOL 300 MG/ML  SOLN COMPARISON:  None Available. FINDINGS: Urinary Tract: Bladder is partially distended. Some wall thickening is noted likely related to incomplete distension. Bowel: No obstructive or inflammatory changes of the visualized bowel are seen. Vascular/Lymphatic: Variant anatomy with a left-sided IVC is noted. Reproductive: Prostate is within normal limits. There is a 5.3 x 3.9 cm enhancing fluid collection identified along the right lateral aspect of the penile shaft at its base consistent with a focal abscess. This extends for approximately 5 cm in craniocaudad projection. Overlying soft tissue edema is noted. No skin wound or drainage is identified. Reactive bilateral inguinal adenopathy is noted. Other:  No free fluid is noted. Musculoskeletal: No bony abnormality is noted. IMPRESSION: Changes consistent with multiloculated abscess at the base of the penis on the right. This does not appear to involve the corpora cavernosa. Electronically Signed   By: Alcide Clever M.D.   On: 05/22/2023 19:51    Procedures Procedures    Medications Ordered in ED Medications  vancomycin (VANCOREADY) IVPB 2000 mg/400 mL (has no administration in time range)  iohexol (OMNIPAQUE) 300 MG/ML solution 100 mL (100 mLs Intravenous Contrast Given 05/22/23 1858)  piperacillin-tazobactam (ZOSYN) IVPB 3.375 g  (3.375 g Intravenous New Bag/Given 05/22/23 2023)    ED Course/ Medical Decision Making/ A&P Clinical Course as of 05/22/23 2127  Tue May 22, 2023  1831 Glucose(!): 338 [AH]    Clinical Course User Index [AH] Arthor Captain, PA-C                             Medical Decision Making This patient presents to the ED for concern of penis swelling, this involves an extensive number of treatment options, and is a complaint that carries with it a high risk of complications and morbidity.  Infection, when he is gangrene, cellulitis, reactive lymphadenopathy, STI, trauma  Co morbidities:      Diabetes, penile injections, hemoglobin A1c of 11  Social Determinants of Health:       SDOH Screenings Depression (PHQ2-9): Low Risk  (04/16/2023) Social Connections: Unknown (03/01/2022)     Received from [facility] Tobacco Use: High Risk (04/16/2023)   Additional history:  {Additional history obtained from EMR   Lab Tests:  I Ordered, and personally interpreted labs.  The pertinent results include:   Urine which is Moding moderate hemoglobin, HIV and RPR, HIV nonreactive.  CBC without elevated white blood cell count, BMP shows a glucose of 338  Imaging Studies:  I ordered imaging studies including CT pelvis with contrast I independently visualized and interpreted imaging which showed large loculated abscess including the shaft and base of the penis I agree with the radiologist interpretation  Cardiac Monitoring/ECG:    Medicines ordered and prescription drug management:  I ordered medication including Medications vancomycin (VANCOREADY) IVPB 2000 mg/400 mL (has no administration in time range) iohexol (OMNIPAQUE) 300 MG/ML solution 100 mL (100 mLs Intravenous Contrast Given 05/22/23 1858) piperacillin-tazobactam (ZOSYN) IVPB 3.375 g (3.375 g Intravenous New Bag/Given 05/22/23 2023) for penis infection Reevaluation of the patient after these medicines showed that the patient stayed  the same I have reviewed the patients home medicines and have made adjustments as needed  Test Considered:         Critical Interventions:       Broad-spectrum antibiotics  Consultations  Obtained: Patient seen in the emergency department by Dr. Berneice Heinrich.  Unfortunately the OR is completely booked up at this time.  Will need admission and n.p.o. status after midnight.  He will have surgical intervention tomorrow.  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  He has had a consult with urology.  Will need admission for I&D of the penis tomorrow.  Comorbidities include diabetes.  He is receiving IV antibiotics including vancomycin and Zosyn.   Dispostion:  After consideration of the diagnostic results and the patients response to treatment, I feel that the patent would benefit from admission for surgery..    Amount and/or Complexity of Data Reviewed Labs: ordered. Decision-making details documented in ED Course. Radiology: ordered.  Risk Prescription drug management. Decision regarding hospitalization. Emergency major surgery.   Patient here with pain and swelling of the penis.  There is no tenderness however differential diagnosis includes cellulitis versus deep space infection.  I am also concerned for potential lymphogranuloma venereum or syphilis.  STI testing ordered.  Patient is in agreement with plan for testing for HIV syphilis and GC chlamydia.  I have also ordered a CT pelvis with contrast.  Final Clinical Impression(s) / ED Diagnoses Final diagnoses:  Abscess of corpus cavernosum and penis    Rx / DC Orders ED Discharge Orders     None         Arthor Captain, PA-C 05/22/23 2127    Bethann Berkshire, MD 05/23/23 1315

## 2023-05-23 ENCOUNTER — Encounter (HOSPITAL_COMMUNITY): Payer: Self-pay | Admitting: Internal Medicine

## 2023-05-23 ENCOUNTER — Inpatient Hospital Stay (HOSPITAL_COMMUNITY): Payer: Commercial Managed Care - HMO | Admitting: Anesthesiology

## 2023-05-23 ENCOUNTER — Encounter (HOSPITAL_COMMUNITY)
Admission: EM | Discharge status: Against Medical Advice | Payer: Self-pay | Source: Home / Self Care | Attending: Internal Medicine

## 2023-05-23 DIAGNOSIS — I10 Essential (primary) hypertension: Secondary | ICD-10-CM | POA: Diagnosis not present

## 2023-05-23 DIAGNOSIS — N4821 Abscess of corpus cavernosum and penis: Secondary | ICD-10-CM | POA: Diagnosis not present

## 2023-05-23 DIAGNOSIS — J45909 Unspecified asthma, uncomplicated: Secondary | ICD-10-CM | POA: Diagnosis not present

## 2023-05-23 DIAGNOSIS — F17210 Nicotine dependence, cigarettes, uncomplicated: Secondary | ICD-10-CM

## 2023-05-23 HISTORY — PX: IRRIGATION AND DEBRIDEMENT ABSCESS: SHX5252

## 2023-05-23 LAB — BASIC METABOLIC PANEL
Anion gap: 7 (ref 5–15)
BUN: 8 mg/dL (ref 6–20)
CO2: 28 mmol/L (ref 22–32)
Calcium: 8.7 mg/dL — ABNORMAL LOW (ref 8.9–10.3)
Chloride: 99 mmol/L (ref 98–111)
Creatinine, Ser: 0.58 mg/dL — ABNORMAL LOW (ref 0.61–1.24)
GFR, Estimated: >60 mL/min (ref >60)
Glucose, Bld: 186 mg/dL — ABNORMAL HIGH (ref 70–99)
Potassium: 3.3 mmol/L — ABNORMAL LOW (ref 3.5–5.1)
Sodium: 134 mmol/L — ABNORMAL LOW (ref 135–145)

## 2023-05-23 LAB — GC/CHLAMYDIA PROBE AMP (~~LOC~~) NOT AT ARMC
Chlamydia: NEGATIVE
Comment: NEGATIVE
Comment: NORMAL
Neisseria Gonorrhea: NEGATIVE

## 2023-05-23 LAB — CBC
HCT: 40.2 % (ref 39.0–52.0)
Hemoglobin: 13.7 g/dL (ref 13.0–17.0)
MCH: 28.6 pg (ref 26.0–34.0)
MCHC: 34.1 g/dL (ref 30.0–36.0)
MCV: 83.9 fL (ref 80.0–100.0)
Platelets: 320 10*3/uL (ref 150–400)
RBC: 4.79 MIL/uL (ref 4.22–5.81)
RDW: 11.1 % — ABNORMAL LOW (ref 11.5–15.5)
WBC: 9.5 10*3/uL (ref 4.0–10.5)
nRBC: 0 % (ref 0.0–0.2)

## 2023-05-23 LAB — AEROBIC/ANAEROBIC CULTURE W GRAM STAIN (SURGICAL/DEEP WOUND)

## 2023-05-23 LAB — GLUCOSE, CAPILLARY
Glucose-Capillary: 157 mg/dL — ABNORMAL HIGH (ref 70–99)
Glucose-Capillary: 153 mg/dL — ABNORMAL HIGH (ref 70–99)
Glucose-Capillary: 135 mg/dL — ABNORMAL HIGH (ref 70–99)
Glucose-Capillary: 179 mg/dL — ABNORMAL HIGH (ref 70–99)
Glucose-Capillary: 152 mg/dL — ABNORMAL HIGH (ref 70–99)
Glucose-Capillary: 310 mg/dL — ABNORMAL HIGH (ref 70–99)
Glucose-Capillary: 343 mg/dL — ABNORMAL HIGH (ref 70–99)

## 2023-05-23 LAB — SURGICAL PCR SCREEN
MRSA, PCR: NEGATIVE
Staphylococcus aureus: POSITIVE — AB

## 2023-05-23 LAB — RPR: RPR Ser Ql: NONREACTIVE

## 2023-05-23 IMAGING — CR DG FOOT COMPLETE 3+V*R*
3 series · 3 of 3 positions shown · non-contrast
Comparison: None.

CLINICAL DATA: Right foot pain and swelling.

EXAM:
RIGHT FOOT COMPLETE - 3+ VIEW

[foot ap]
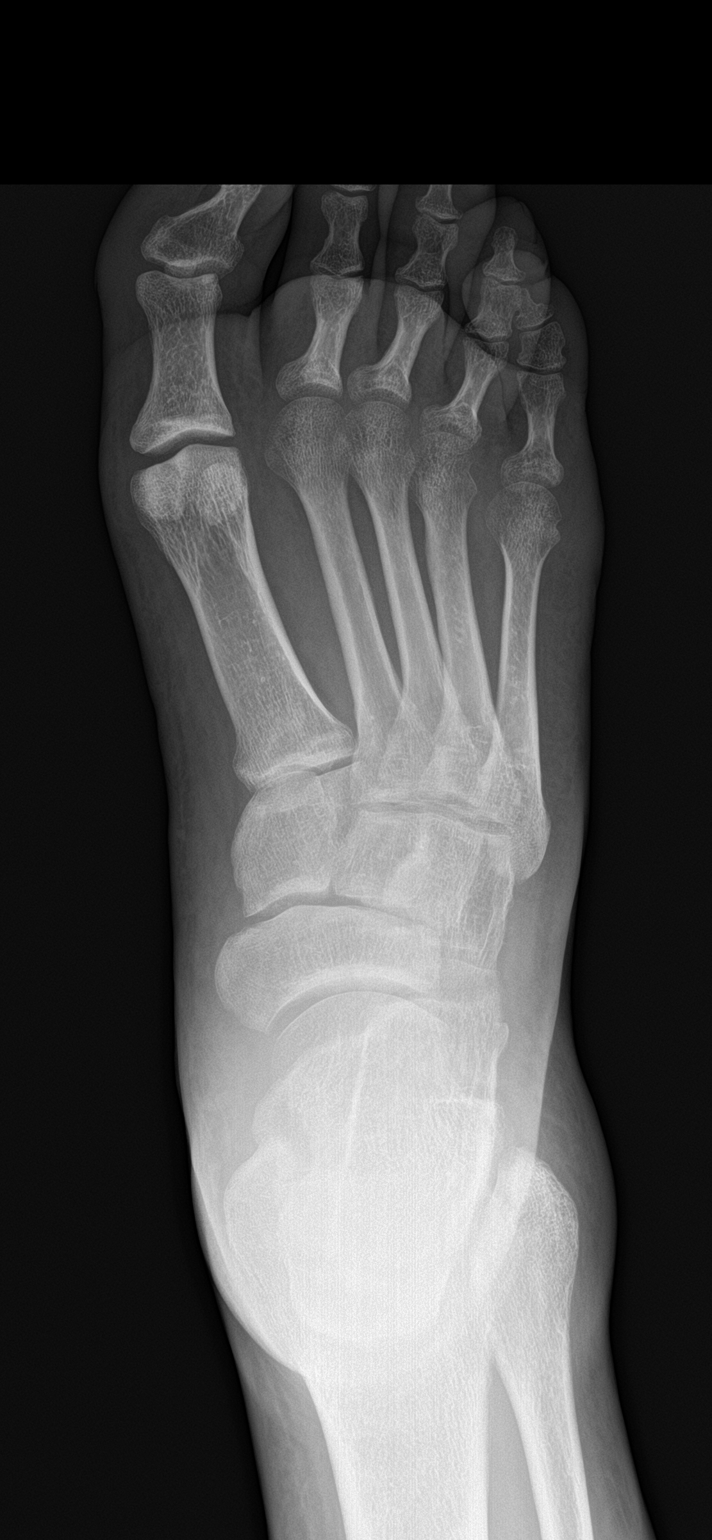

[foot obl]
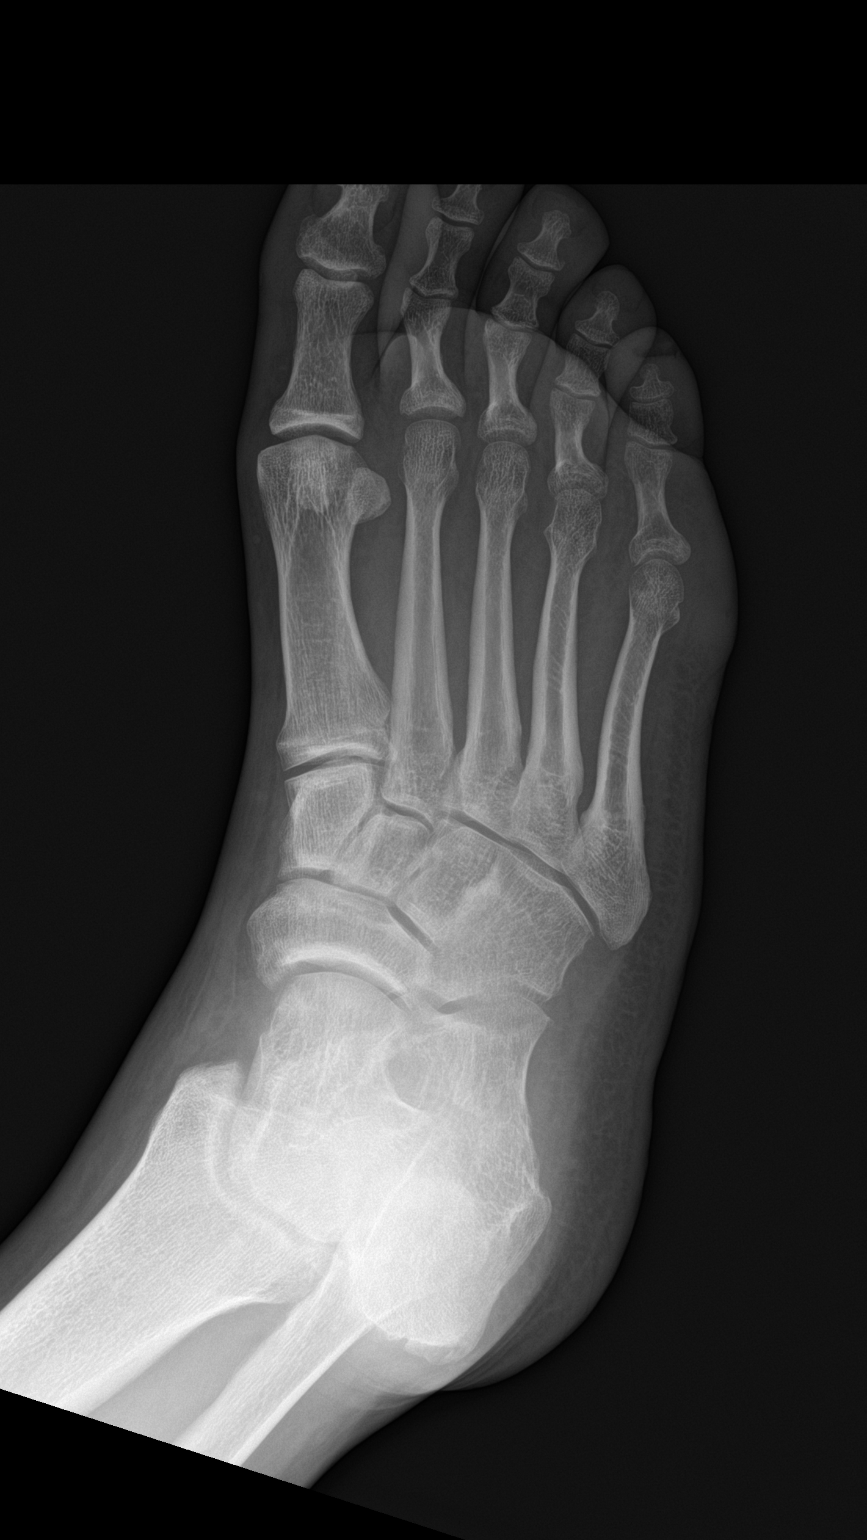

[foot lat]
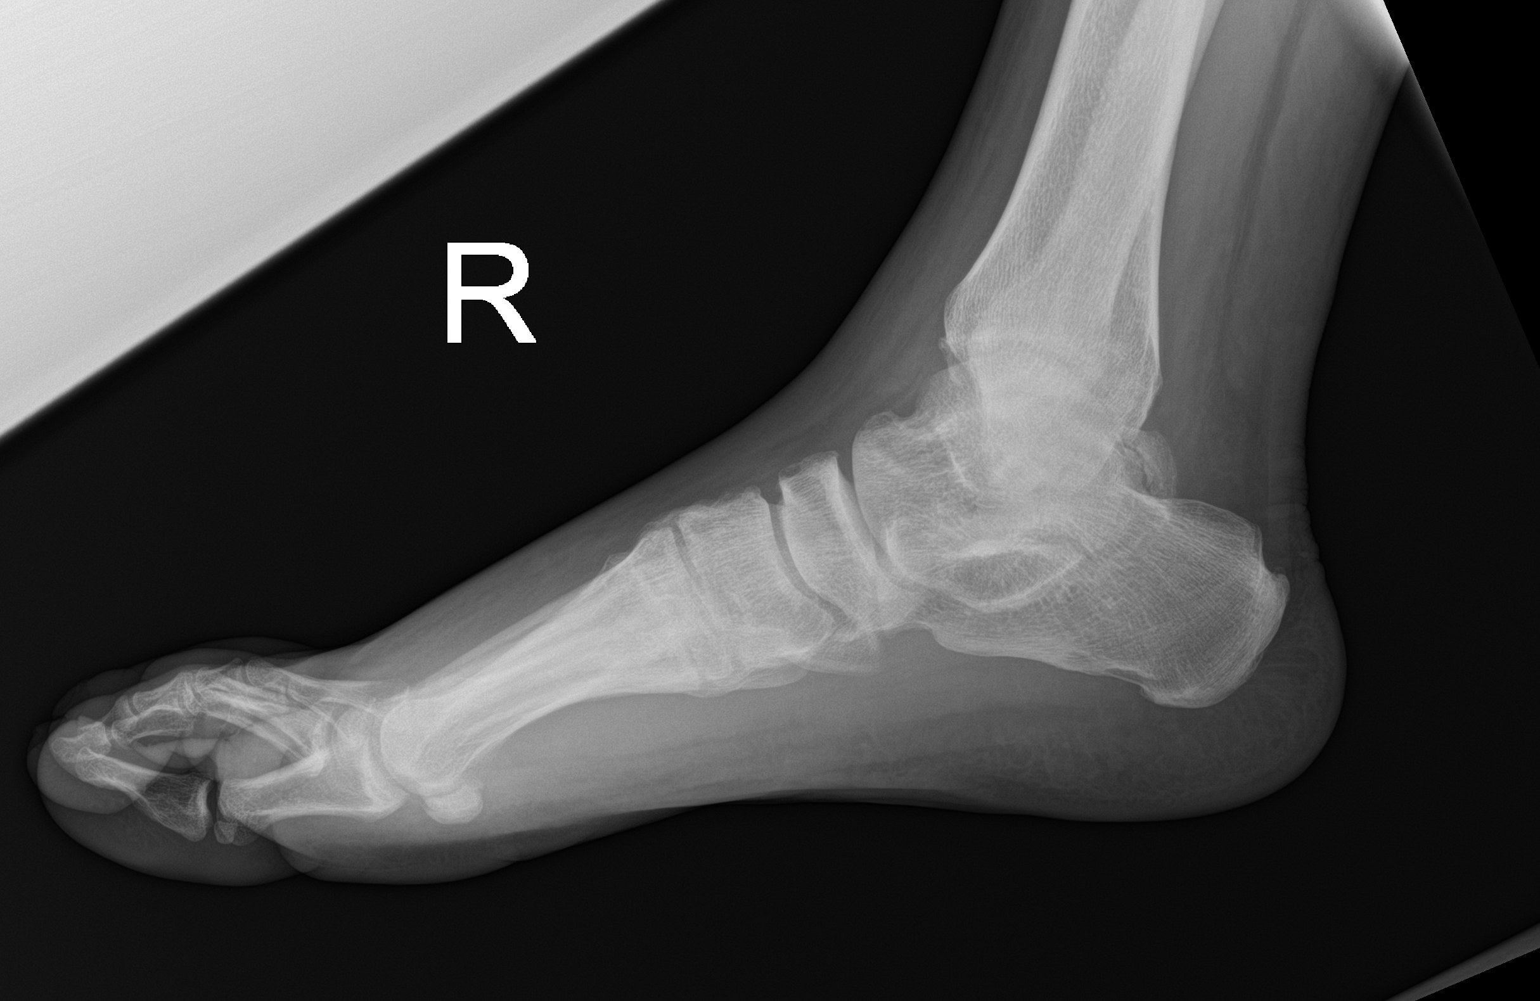

[3 of 3 positions shown; findings below may reference images not displayed]

FINDINGS: There is no evidence of fracture or dislocation. There is no
evidence of arthropathy or other focal bone abnormality. Soft
tissues are unremarkable.
IMPRESSION: Negative.

## 2023-05-23 SURGERY — IRRIGATION AND DEBRIDEMENT ABSCESS
Anesthesia: General

## 2023-05-23 MED ORDER — PROPOFOL 10 MG/ML IV BOLUS
INTRAVENOUS | Status: DC | PRN
Start: 2023-05-23 — End: 2023-05-23
  Administered 2023-05-23: 200 mg via INTRAVENOUS

## 2023-05-23 MED ORDER — LACTATED RINGERS IV SOLN: INTRAVENOUS | Status: DC

## 2023-05-23 MED ORDER — FENTANYL CITRATE PF 50 MCG/ML IJ SOSY: 25.0000 ug | PREFILLED_SYRINGE | INTRAMUSCULAR | Status: DC | PRN

## 2023-05-23 MED ORDER — ACETAMINOPHEN 10 MG/ML IV SOLN: 1000.0000 mg | Freq: Once | INTRAVENOUS | Status: DC | PRN

## 2023-05-23 MED ORDER — PROPOFOL 10 MG/ML IV BOLUS
INTRAVENOUS | Status: AC
  Filled 2023-05-23: qty 20

## 2023-05-23 MED ORDER — MUPIROCIN 2 % EX OINT
1.0000 | TOPICAL_OINTMENT | Freq: Two times a day (BID) | CUTANEOUS | Status: DC
  Administered 2023-05-23 – 2023-05-24 (×3): 1 via NASAL
  Filled 2023-05-23: qty 22

## 2023-05-23 MED ORDER — ONDANSETRON HCL 4 MG/2ML IJ SOLN
INTRAMUSCULAR | Status: AC
  Filled 2023-05-23: qty 2

## 2023-05-23 MED ORDER — LIDOCAINE 2% (20 MG/ML) 5 ML SYRINGE
INTRAMUSCULAR | Status: DC | PRN
  Administered 2023-05-23: 100 mg via INTRAVENOUS

## 2023-05-23 MED ORDER — OXYCODONE HCL 5 MG PO TABS: 5.0000 mg | ORAL_TABLET | Freq: Once | ORAL | Status: DC | PRN

## 2023-05-23 MED ORDER — OXYCODONE HCL 5 MG/5ML PO SOLN: 5.0000 mg | Freq: Once | ORAL | Status: DC | PRN

## 2023-05-23 MED ORDER — CHLORHEXIDINE GLUCONATE CLOTH 2 % EX PADS
6.0000 | MEDICATED_PAD | Freq: Every day | CUTANEOUS | Status: DC
  Administered 2023-05-23: 6 via TOPICAL

## 2023-05-23 MED ORDER — SODIUM CHLORIDE 0.9 % IR SOLN
Status: DC | PRN
  Administered 2023-05-23: 3000 mL

## 2023-05-23 MED ORDER — POTASSIUM CHLORIDE CRYS ER 20 MEQ PO TBCR
40.0000 meq | EXTENDED_RELEASE_TABLET | ORAL | Status: AC
  Administered 2023-05-23 (×2): 40 meq via ORAL
  Filled 2023-05-23 (×2): qty 2

## 2023-05-23 MED ORDER — ONDANSETRON HCL 4 MG/2ML IJ SOLN
INTRAMUSCULAR | Status: DC | PRN
Start: 2023-05-23 — End: 2023-05-23
  Administered 2023-05-23: 4 mg via INTRAVENOUS

## 2023-05-23 MED ORDER — FENTANYL CITRATE (PF) 100 MCG/2ML IJ SOLN
INTRAMUSCULAR | Status: AC
  Filled 2023-05-23: qty 2

## 2023-05-23 MED ORDER — MIDAZOLAM HCL 5 MG/5ML IJ SOLN
INTRAMUSCULAR | Status: DC | PRN
  Administered 2023-05-23: 2 mg via INTRAVENOUS

## 2023-05-23 MED ORDER — BUPIVACAINE HCL (PF) 0.5 % IJ SOLN
INTRAMUSCULAR | Status: AC
  Filled 2023-05-23: qty 30

## 2023-05-23 MED ORDER — ORAL CARE MOUTH RINSE: 15.0000 mL | Freq: Once | OROMUCOSAL | Status: AC

## 2023-05-23 MED ORDER — ROCURONIUM BROMIDE 10 MG/ML (PF) SYRINGE
PREFILLED_SYRINGE | INTRAVENOUS | Status: AC
  Filled 2023-05-23: qty 10

## 2023-05-23 MED ORDER — BUPIVACAINE HCL (PF) 0.5 % IJ SOLN
INTRAMUSCULAR | Status: DC | PRN
  Administered 2023-05-23: 20 mL

## 2023-05-23 MED ORDER — CHLORHEXIDINE GLUCONATE 0.12 % MT SOLN
15.0000 mL | Freq: Once | OROMUCOSAL | Status: AC
  Administered 2023-05-23: 15 mL via OROMUCOSAL

## 2023-05-23 MED ORDER — SUCCINYLCHOLINE CHLORIDE 200 MG/10ML IV SOSY
PREFILLED_SYRINGE | INTRAVENOUS | Status: AC
  Filled 2023-05-23: qty 10

## 2023-05-23 MED ORDER — 0.9 % SODIUM CHLORIDE (POUR BTL) OPTIME
TOPICAL | Status: DC | PRN
  Administered 2023-05-23: 1000 mL

## 2023-05-23 MED ORDER — ONDANSETRON HCL 4 MG/2ML IJ SOLN: 4.0000 mg | Freq: Once | INTRAMUSCULAR | Status: DC | PRN

## 2023-05-23 MED ORDER — FENTANYL CITRATE (PF) 100 MCG/2ML IJ SOLN
INTRAMUSCULAR | Status: DC | PRN
  Administered 2023-05-23: 50 ug via INTRAVENOUS

## 2023-05-23 MED ORDER — MIDAZOLAM HCL 2 MG/2ML IJ SOLN
INTRAMUSCULAR | Status: AC
  Filled 2023-05-23: qty 2

## 2023-05-23 MED ORDER — DEXAMETHASONE SODIUM PHOSPHATE 10 MG/ML IJ SOLN
INTRAMUSCULAR | Status: AC
  Filled 2023-05-23: qty 1

## 2023-05-23 SURGICAL SUPPLY — 37 items
ADH SKN CLS APL DERMABOND .7 (GAUZE/BANDAGES/DRESSINGS) ×1
APL SKNCLS STERI-STRIP NONHPOA (GAUZE/BANDAGES/DRESSINGS) ×1
BAG COUNTER SPONGE SURGICOUNT (BAG) IMPLANT
BAG SPNG CNTER NS LX DISP (BAG)
BENZOIN TINCTURE PRP APPL 2/3 (GAUZE/BANDAGES/DRESSINGS) ×1 IMPLANT
BLADE HEX COATED 2.75 (ELECTRODE) ×1 IMPLANT
BLADE SURG 15 STRL LF DISP TIS (BLADE) ×1 IMPLANT
BLADE SURG 15 STRL SS (BLADE) ×1
BNDG GAUZE DERMACEA FLUFF 4 (GAUZE/BANDAGES/DRESSINGS) ×1 IMPLANT
BNDG GZE DERMACEA 4 6PLY (GAUZE/BANDAGES/DRESSINGS) ×1
DERMABOND ADVANCED .7 DNX12 (GAUZE/BANDAGES/DRESSINGS) ×1 IMPLANT
DRAIN PENROSE 0.25X18 (DRAIN) ×1 IMPLANT
DRAIN PENROSE 0.5X18 (DRAIN) ×1 IMPLANT
DRAPE LAPAROTOMY T 98X78 PEDS (DRAPES) ×1 IMPLANT
ELECT REM PT RETURN 15FT ADLT (MISCELLANEOUS) ×1 IMPLANT
GAUZE PAD ABD 8X10 STRL (GAUZE/BANDAGES/DRESSINGS) IMPLANT
GAUZE SPONGE 4X4 12PLY STRL (GAUZE/BANDAGES/DRESSINGS) ×1 IMPLANT
GLOVE SURG LX STRL 7.5 STRW (GLOVE) ×1 IMPLANT
GOWN SRG XL LVL 4 BRTHBL STRL (GOWNS) ×1 IMPLANT
GOWN STRL NON-REIN XL LVL4 (GOWNS) ×1
KIT BASIN OR (CUSTOM PROCEDURE TRAY) ×1 IMPLANT
KIT TURNOVER KIT A (KITS) IMPLANT
NDL HYPO 22X1.5 SAFETY MO (MISCELLANEOUS) IMPLANT
NEEDLE HYPO 22X1.5 SAFETY MO (MISCELLANEOUS) IMPLANT
NS IRRIG 1000ML POUR BTL (IV SOLUTION) IMPLANT
PACK BASIC VI WITH GOWN DISP (CUSTOM PROCEDURE TRAY) ×1 IMPLANT
PENCIL SMOKE EVACUATOR (MISCELLANEOUS) IMPLANT
SPONGE T-LAP 4X18 ~~LOC~~+RFID (SPONGE) ×2 IMPLANT
SUPPORT SCROTAL LG STRP (MISCELLANEOUS) ×1 IMPLANT
SUT MNCRL AB 4-0 PS2 18 (SUTURE) ×1 IMPLANT
SUT SILK 0 (SUTURE) ×1
SUT SILK 0 30XBRD TIE 6 (SUTURE) ×1 IMPLANT
SUT VIC AB 3-0 SH 27 (SUTURE) ×1
SUT VIC AB 3-0 SH 27XBRD (SUTURE) ×1 IMPLANT
SYR 20ML LL LF (SYRINGE) ×1 IMPLANT
SYR CONTROL 10ML LL (SYRINGE) IMPLANT
WATER STERILE IRR 1000ML POUR (IV SOLUTION) IMPLANT

## 2023-05-23 NOTE — Anesthesia Preprocedure Evaluation (Signed)
Anesthesia Evaluation  Patient identified by MRN, date of birth, ID band Patient awake    Reviewed: Allergy & Precautions, H&P , NPO status , Patient's Chart, lab work & pertinent test results  Airway Mallampati: III  TM Distance: <3 FB Neck ROM: Full    Dental no notable dental hx.    Pulmonary asthma , Current Smoker and Patient abstained from smoking.   Pulmonary exam normal breath sounds clear to auscultation       Cardiovascular hypertension, Pt. on medications Normal cardiovascular exam Rhythm:Regular Rate:Normal     Neuro/Psych negative neurological ROS  negative psych ROS   GI/Hepatic negative GI ROS,,,(+)     substance abuse  cocaine use  Endo/Other  diabetes, Poorly Controlled, Type 2, Insulin Dependent    Renal/GU negative Renal ROS  negative genitourinary   Musculoskeletal negative musculoskeletal ROS (+)    Abdominal   Peds negative pediatric ROS (+)  Hematology negative hematology ROS (+)   Anesthesia Other Findings On suboxone  Reproductive/Obstetrics negative OB ROS                             Anesthesia Physical Anesthesia Plan  ASA: 3  Anesthesia Plan: General   Post-op Pain Management: Toradol IV (intra-op)*   Induction: Intravenous  PONV Risk Score and Plan: 1 and Ondansetron and Treatment may vary due to age or medical condition  Airway Management Planned: LMA  Additional Equipment:   Intra-op Plan:   Post-operative Plan: Extubation in OR  Informed Consent: I have reviewed the patients History and Physical, chart, labs and discussed the procedure including the risks, benefits and alternatives for the proposed anesthesia with the patient or authorized representative who has indicated his/her understanding and acceptance.     Dental advisory given  Plan Discussed with: CRNA and Surgeon  Anesthesia Plan Comments:        Anesthesia Quick  Evaluation

## 2023-05-23 NOTE — Brief Op Note (Signed)
05/23/2023  3:25 PM  PATIENT:  [name]  32 y.o. male  PRE-OPERATIVE DIAGNOSIS:  penile abscess  POST-OPERATIVE DIAGNOSIS:  penile abscess  PROCEDURE:  Procedure(s): IRRIGATION AND DEBRIDEMENT ABSCESS, PENILE ABSCESS (N/A)  SURGEON:  Surgeons and Role:    * Tirza Senteno, Delbert Phenix., MD - Primary  PHYSICIAN ASSISTANT:   ASSISTANTS: none   ANESTHESIA:   local and general  EBL:  20mL   BLOOD ADMINISTERED:none  DRAINS: Penrose drain in the peno-scrotal area, left    LOCAL MEDICATIONS USED:  MARCAINE     SPECIMEN:  Source of Specimen:  penile abscess fluid  DISPOSITION OF SPECIMEN:   microbiology  COUNTS:  YES  TOURNIQUET:  * No tourniquets in log *  DICTATION: .Other Dictation: Dictation Number [id number]  PLAN OF CARE: Admit to inpatient   PATIENT DISPOSITION:  PACU - hemodynamically stable.   Delay start of Pharmacological VTE agent (>24hrs) due to surgical blood loss or risk of bleeding: not applicable

## 2023-05-23 NOTE — Anesthesia Postprocedure Evaluation (Signed)
Anesthesia Post Note  Patient: [name]  Procedure(s) Performed: IRRIGATION AND DEBRIDEMENT ABSCESS, PENILE ABSCESS     Patient location during evaluation: PACU Anesthesia Type: General Level of consciousness: awake and alert Pain management: pain level controlled Vital Signs Assessment: post-procedure vital signs reviewed and stable Respiratory status: spontaneous breathing, nonlabored ventilation, respiratory function stable and patient connected to nasal cannula oxygen Cardiovascular status: blood pressure returned to baseline and stable Postop Assessment: no apparent nausea or vomiting Anesthetic complications: no  No notable events documented.  Last Vitals:  Vitals:   05/23/23 1537 05/23/23 1545  BP:  (!) 137/94  Pulse: 78 75  Resp: 19 20  Temp:    SpO2: 100% 96%    Last Pain:  Vitals:   05/23/23 1545  TempSrc:   PainSc: 0-No pain                 Halli Equihua S

## 2023-05-23 NOTE — Anesthesia Procedure Notes (Signed)
Procedure Name: Intubation Date/Time: 05/23/2023 2:48 PM  Performed by: Elisabeth Cara, CRNAPre-anesthesia Checklist: Patient identified, Patient being monitored, Timeout performed, Emergency Drugs available and Suction available Patient Re-evaluated:Patient Re-evaluated prior to induction Oxygen Delivery Method: Circle system utilized Preoxygenation: Pre-oxygenation with 100% oxygen Induction Type: IV induction, Rapid sequence and Cricoid Pressure applied Laryngoscope Size: Mac and 4 Grade View: Grade II Tube type: Oral Tube size: 7.5 mm Number of attempts: 2 Airway Equipment and Method: Stylet Placement Confirmation: ETT inserted through vocal cords under direct vision, positive ETCO2 and breath sounds checked- equal and bilateral Secured at: 23 cm Tube secured with: Tape Dental Injury: Teeth and Oropharynx as per pre-operative assessment  Comments: Smooth IV induction. DL X 1 by CRNA. Grade 2-3 view. - ETCO2. ETT removed. DL X 1 by DR Okey Dupre. Grade 2 view. + ETCO2. BBS=. ATOI. ETT secured at 23 cm at the lip

## 2023-05-23 NOTE — Transfer of Care (Signed)
Immediate Anesthesia Transfer of Care Note  Patient: [name]  Procedure(s) Performed: IRRIGATION AND DEBRIDEMENT ABSCESS, PENILE ABSCESS  Patient Location: PACU  Anesthesia Type:General  Level of Consciousness: awake, alert , oriented, and patient cooperative  Airway & Oxygen Therapy: Patient Spontanous Breathing and Patient connected to face mask oxygen  Post-op Assessment: Report given to RN, Post -op Vital signs reviewed and stable, and Patient moving all extremities  Post vital signs: Reviewed and stable  Last Vitals:  Vitals Value Taken Time  BP 144/102 05/23/23 1532  Temp    Pulse 80 05/23/23 1535  Resp 13 05/23/23 1535  SpO2 100 % 05/23/23 1535  Vitals shown include unfiled device data.  Last Pain:  Vitals:   05/23/23 1352  TempSrc: Oral  PainSc: 4       Patients Stated Pain Goal: 3 (05/23/23 0844)  Complications: No notable events documented.

## 2023-05-23 NOTE — TOC Initial Note (Signed)
Transition of Care [facility]) - Initial/Assessment Note    Patient Details  Name: [name] MRN: [id number] Date of Birth: 02/09/1991  Transition of Care [facility] CM/SW Contact:    Lanier Clam, RN Phone Number: 05/23/2023, 11:41 AM  Clinical Narrative:   SA resources added to AVS.                 Expected Discharge Plan: Home/Self Care Barriers to Discharge: Continued Medical Work up   Patient Goals and CMS Choice Patient states their goals for this hospitalization and ongoing recovery are:: Home CMS Medicare.gov Compare Post Acute Care list provided to:: Patient   [place] ownership interest in [facility].provided to:: Patient    Expected Discharge Plan and Services                                              Prior Living Arrangements/Services                       Activities of Daily Living Home Assistive Devices/Equipment: None ADL Screening (condition at time of admission) Patient's cognitive ability adequate to safely complete daily activities?: Yes Is the patient deaf or have difficulty hearing?: No Does the patient have difficulty seeing, even when wearing glasses/contacts?: No Does the patient have difficulty concentrating, remembering, or making decisions?: No Patient able to express need for assistance with ADLs?: Yes Does the patient have difficulty dressing or bathing?: No Independently performs ADLs?: Yes (appropriate for developmental age) Does the patient have difficulty walking or climbing stairs?: No Weakness of Legs: None Weakness of Arms/Hands: None  Permission Sought/Granted                  Emotional Assessment              Admission diagnosis:  Penile abscess [N48.21] Abscess of corpus cavernosum and penis [N48.21] Patient Active Problem List   Diagnosis Date Noted   Penile abscess 05/22/2023   Insulin dependent type 2 diabetes mellitus (HCC) 01/24/2022   Asthma    Callus 10/19/2021    Anxiety and depression 03/22/2021   Erectile dysfunction 09/15/2020   Opioid use disorder 08/05/2020   Neuropathy 07/23/2020   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 [place] [address] Phone: [phone] Fax: [phone]  [facility] [address] [place] [address] Phone: [phone] Fax: [phone]  [facility] [address] [place]), [place] - [address] [place] [address] Phone: [phone] Fax: [phone]     Social Determinants of Health (SDOH) Social History: SDOH Screenings   Food Insecurity: No Food Insecurity (05/22/2023)  Housing: Low Risk  (05/22/2023)  Transportation Needs: No Transportation Needs (05/22/2023)  Utilities: Not At Risk (05/22/2023)  Depression (PHQ2-9): Low Risk  (04/16/2023)  Social Connections: Unknown (03/01/2022)   Received from [facility]  Tobacco Use: High Risk (05/23/2023)   SDOH Interventions:     Readmission Risk Interventions     No data to display

## 2023-05-23 NOTE — Plan of Care (Signed)
  Problem: Education: Goal: Knowledge of General Education information will improve Description: Including pain rating scale, medication(s)/side effects and non-pharmacologic comfort measures Outcome: Adequate for Discharge   Problem: Health Behavior/Discharge Planning: Goal: Ability to manage health-related needs will improve Outcome: Adequate for Discharge   Problem: Clinical Measurements: Goal: Ability to maintain clinical measurements within normal limits will improve Outcome: Adequate for Discharge   Problem: Pain Managment: Goal: General experience of comfort will improve Outcome: Adequate for Discharge

## 2023-05-23 NOTE — Progress Notes (Signed)
Patient blood sugar on arrival to short stay is 179.  Patient had rapid acting insulin within last 4 hours.  Notified anesthesia who stated no pre-op diabetic protocol for patient at this time

## 2023-05-23 NOTE — Progress Notes (Signed)
Progress Note   Patient: [name] ZOX:[id number] DOB: February 04, 1991 DOA: 05/22/2023     1 DOS: the patient was seen and examined on 05/23/2023    Subjective:  Patient seen and examined at bedside this morning in the presence of the mother Tells me the swelling in the pineal area is the same Denies nausea vomiting abdominal pain chest pain cough or urinary complaints Patient being planned for surgical intervention by urologist today   Brief hospital course: From HPI "Timur Nibert is a 32 y.o. male with medical history significant of insulin-dependent type 2 diabetes, hypertension, asthma, opioid use disorder on Suboxone, erectile dysfunction presented to ED with swelling of his groin and penis in the setting of using a self injection of Trimix which was prescribed for erectile dysfunction and involves an injection to the base of the penis.  CT pelvis with contrast showing multiloculated abscess at the base of the penis on the right.  Urology saw the patient in the ED and recommended admission for IV antibiotics (Vanc + Zosyn), aggressive glycemic management, and will plan for operative I&D/drain placement hospitalist on board for acute management "  Assessment and Plan:  Penile abscess in the setting of self ingestion of medication for severe erectile dysfunction secondary to severe uncontrolled diabetes Patient did not meet criteria for sepsis I reviewed patient's CT scan of the pelvis that showed findings of multiloculated abscesses Plan of care discussed with urologist planning incision and drainage today Patient currently n.p.o., will resume diet after surgical intervention Will continue current antibiotic therapy  Follow-up on HIV, GC chlamydia, RPR  Insulin-dependent type 2 diabetes with hyperglycemia Poorly controlled-A1c 11.0 on 04/16/2023.  Glucose in the 300s without signs of DKA.  outpatient follow-up with PCP for better glycemic control Continue glucose  monitoring Continue current sliding scale as well as long-acting insulin  Hypertension Stable, not on antihypertensives at home. Continue to monitor blood pressure closely  Asthma Currently not in acute exacerbation, not on inhalers at home.   Opioid use disorder Continue Suboxone.   DVT prophylaxis: Continue SCD given planned surgical intervention  Code Status: Full Code (discussed with the patient)   Physical Exam:  General: Seen this morning not in acute distress HENT:     Head: normocephalic/atraumatic Eyes:     Extraocular Movements: Extraocular movements intact.  Cardiovascular:     Rate and Rhythm: Normal rate and regular rhythm.     Pulses: Normal pulses.  Pulmonary:     Effort: Pulmonary effort is normal. No respiratory distress.  Abdominal:     General: Bowel sounds are normal. There is no distension.  Musculoskeletal: No edema moves all extremities nonfocal Genitourinary: Swelling involving the base of the penis with no obvious discharge Skin:    General: Skin is warm and dry.  Neurological:     General: No focal deficit present.     Mental Status: He is alert an   Vitals:   05/22/23 2300 05/23/23 0356 05/23/23 0823 05/23/23 1153  BP: (!) 131/90 123/80 122/79 103/65  Pulse: 88 83 79 74  Resp: 20 18 18 18   Temp: 98.1 F (36.7 C) 98.5 F (36.9 C) 97.8 F (36.6 C) 98.9 F (37.2 C)  TempSrc: Oral Oral Oral Oral  SpO2: 98% 97% 98% 100%  Weight:      Height:        Data Reviewed: I have reviewed patient's CT scan of the abdomen pelvis, labs, vitals, urologist documentation, nursing documentation  Family Communication: Discussed with patient's mother  present at bedside    Time spent: 55 minutes spent taking care of patient as well as reviewing patient's chart and discussing with consulting physician  Author: Loyce Dys, MD 05/23/2023 1:30 PM  For on call review [email or web address].

## 2023-05-23 NOTE — Plan of Care (Signed)

## 2023-05-23 NOTE — Progress Notes (Signed)
Day of Surgery   Subjective/Chief Complaint:   1 - Penile Abscess - large R>L penile base swelling and likely abscess 4 days after penile injection for ED. Reports some local swelling and malaize by no fevers. CT with impressive fluid collection w/o gas in Rt penile skin / mons area, some eschar on exam. Glucose 330s, WBC 9s, Cr 0.75.     Today "Tammy Sours" is stable. Still no fevers. Glucose much improved.     Objective: Vital signs in last 24 hours: Temp:  [98 F (36.7 C)-98.5 F (36.9 C)] 98.5 F (36.9 C) (07/24 0356) Pulse Rate:  [83-93] 83 (07/24 0356) Resp:  [16-20] 18 (07/24 0356) BP: (123-132)/(78-116) 123/80 (07/24 0356) SpO2:  [97 %-99 %] 97 % (07/24 0356) Weight:  [90.7 kg] 90.7 kg (07/23 1244) Last BM Date : 05/20/23  Intake/Output from previous day: 07/23 0701 - 07/24 0700 In: 835 [P.O.:120; I.V.:315; IV Piggyback:400] Out: -  Intake/Output this shift: Total I/O In: 835 [P.O.:120; I.V.:315; IV Piggyback:400] Out: -   NAD Non-labored breathing on RA RRR SNTND, No CVAT Stable large penile base / mons swellign with some eschar, stil no crepitus.   Lab Results:  Recent Labs    05/22/23 1716 05/23/23 0509  WBC 9.1 9.5  HGB 14.9 13.7  HCT 42.9 40.2  PLT 342 320   BMET Recent Labs    05/22/23 1716 05/23/23 0509  NA 132* 134*  K 4.1 3.3*  CL 95* 99  CO2 27 28  GLUCOSE 338* 186*  BUN 8 8  CREATININE 0.75 0.58*  CALCIUM 8.8* 8.7*   PT/INR No results for input(s): "LABPROT", "INR" in the last 72 hours. ABG No results for input(s): "PHART", "HCO3" in the last 72 hours.  Invalid input(s): "PCO2", "PO2"  Studies/Results: CT PELVIS W CONTRAST  Result Date: 05/22/2023 CLINICAL DATA:  Soft tissue swelling over mons pubis with penile swelling and ulceration, initial encounter EXAM: CT PELVIS WITH CONTRAST TECHNIQUE: Multidetector CT imaging of the pelvis was performed using the standard protocol following the bolus administration of intravenous contrast.  RADIATION DOSE REDUCTION: This exam was performed according to the departmental dose-optimization program which includes automated exposure control, adjustment of the mA and/or kV according to patient size and/or use of iterative reconstruction technique. CONTRAST:  OMNIPAQUE IOHEXOL 300 MG/ML  SOLN COMPARISON:  None Available. FINDINGS: Urinary Tract: Bladder is partially distended. Some wall thickening is noted likely related to incomplete distension. Bowel: No obstructive or inflammatory changes of the visualized bowel are seen. Vascular/Lymphatic: Variant anatomy with a left-sided IVC is noted. Reproductive: Prostate is within normal limits. There is a 5.3 x 3.9 cm enhancing fluid collection identified along the right lateral aspect of the penile shaft at its base consistent with a focal abscess. This extends for approximately 5 cm in craniocaudad projection. Overlying soft tissue edema is noted. No skin wound or drainage is identified. Reactive bilateral inguinal adenopathy is noted. Other:  No free fluid is noted. Musculoskeletal: No bony abnormality is noted. IMPRESSION: Changes consistent with multiloculated abscess at the base of the penis on the right. This does not appear to involve the corpora cavernosa. Electronically Signed   By: Alcide Clever M.D.   On: 05/22/2023 19:51    Anti-infectives: Anti-infectives (From admission, onward)    Start     Dose/Rate Route Frequency Ordered Stop   05/23/23 1200  vancomycin (VANCOREADY) IVPB 1250 mg/250 mL        1,250 mg 166.7 mL/hr over 90 Minutes Intravenous  Every 12 hours 05/22/23 2346     05/23/23 0400  piperacillin-tazobactam (ZOSYN) IVPB 3.375 g        3.375 g 12.5 mL/hr over 240 Minutes Intravenous Every 8 hours 05/22/23 2311     05/22/23 2030  piperacillin-tazobactam (ZOSYN) IVPB 3.375 g        3.375 g 100 mL/hr over 30 Minutes Intravenous  Once 05/22/23 2017 05/22/23 2242   05/22/23 2030  vancomycin (VANCOREADY) IVPB 2000 mg/400 mL         2,000 mg 200 mL/hr over 120 Minutes Intravenous  Once 05/22/23 2022 05/23/23 0123       Assessment/Plan:  Proceed as planned with I+D very large penile abscess. Risks, benefits, alternatives, expected peri-op course including need for possible staged approach discussed again.    Loletta Parish. 05/23/2023

## 2023-05-24 ENCOUNTER — Other Ambulatory Visit (HOSPITAL_COMMUNITY): Payer: Self-pay

## 2023-05-24 ENCOUNTER — Encounter (HOSPITAL_COMMUNITY): Payer: Self-pay | Admitting: Urology

## 2023-05-24 DIAGNOSIS — N4821 Abscess of corpus cavernosum and penis: Secondary | ICD-10-CM | POA: Diagnosis not present

## 2023-05-24 LAB — BASIC METABOLIC PANEL
Anion gap: 8 (ref 5–15)
CO2: 25 mmol/L (ref 22–32)
Calcium: 8.4 mg/dL — ABNORMAL LOW (ref 8.9–10.3)
Chloride: 101 mmol/L (ref 98–111)
Creatinine, Ser: 0.54 mg/dL — ABNORMAL LOW (ref 0.61–1.24)
GFR, Estimated: >60 mL/min (ref >60)
Glucose, Bld: 234 mg/dL — ABNORMAL HIGH (ref 70–99)
Potassium: 4.4 mmol/L (ref 3.5–5.1)
Sodium: 134 mmol/L — ABNORMAL LOW (ref 135–145)

## 2023-05-24 LAB — CBC WITH DIFFERENTIAL/PLATELET
Abs Immature Granulocytes: 0.03 10*3/uL (ref 0.00–0.07)
Basophils Relative: 1 %
Eosinophils Absolute: 0.6 10*3/uL — ABNORMAL HIGH (ref 0.0–0.5)
Eosinophils Relative: 5 %
HCT: 37.3 % — ABNORMAL LOW (ref 39.0–52.0)
Hemoglobin: 12.6 g/dL — ABNORMAL LOW (ref 13.0–17.0)
Immature Granulocytes: 0 %
Lymphocytes Relative: 32 %
Lymphs Abs: 3.5 10*3/uL (ref 0.7–4.0)
MCH: 29.2 pg (ref 26.0–34.0)
MCHC: 33.8 g/dL (ref 30.0–36.0)
MCV: 86.3 fL (ref 80.0–100.0)
Monocytes Absolute: 0.5 10*3/uL (ref 0.1–1.0)
Monocytes Relative: 4 %
Neutro Abs: 6.3 10*3/uL (ref 1.7–7.7)
Platelets: 277 10*3/uL (ref 150–400)
RDW: 11.1 % — ABNORMAL LOW (ref 11.5–15.5)

## 2023-05-24 LAB — GLUCOSE, CAPILLARY
Glucose-Capillary: 126 mg/dL — ABNORMAL HIGH (ref 70–99)
Glucose-Capillary: 217 mg/dL — ABNORMAL HIGH (ref 70–99)
Glucose-Capillary: 246 mg/dL — ABNORMAL HIGH (ref 70–99)
Glucose-Capillary: 314 mg/dL — ABNORMAL HIGH (ref 70–99)

## 2023-05-24 LAB — AEROBIC/ANAEROBIC CULTURE W GRAM STAIN (SURGICAL/DEEP WOUND)

## 2023-05-24 MED ORDER — CHLORHEXIDINE GLUCONATE CLOTH 2 % EX PADS
6.0000 | MEDICATED_PAD | Freq: Every day | CUTANEOUS | Status: DC
  Administered 2023-05-24: 6 via TOPICAL

## 2023-05-24 MED ORDER — IBUPROFEN 400 MG PO TABS
400.0000 mg | ORAL_TABLET | Freq: Four times a day (QID) | ORAL | 0 refills | Status: DC | PRN
  Filled 2023-05-24: qty 30, 8d supply, fill #0

## 2023-05-24 MED ORDER — BUPRENORPHINE HCL-NALOXONE HCL 8-2 MG SL SUBL
1.0000 | SUBLINGUAL_TABLET | Freq: Three times a day (TID) | SUBLINGUAL | Status: DC
  Administered 2023-05-24: 1 via SUBLINGUAL
  Filled 2023-05-24: qty 1

## 2023-05-24 MED ORDER — DOXYCYCLINE HYCLATE 50 MG PO CAPS
100.0000 mg | ORAL_CAPSULE | Freq: Two times a day (BID) | ORAL | 0 refills | Status: AC
  Filled 2023-05-24: qty 40, 10d supply, fill #0

## 2023-05-24 MED ORDER — AMOXICILLIN-POT CLAVULANATE 875-125 MG PO TABS: 1.0000 | ORAL_TABLET | Freq: Two times a day (BID) | ORAL | Status: DC

## 2023-05-24 NOTE — Discharge Summary (Signed)
Physician Discharge Summary   Patient: [name] MRN: [id number] DOB: 1991/05/12  Admit date:     05/22/2023  Discharge date: 05/24/23  Discharge Physician: Loyce Dys   PCP: Olegario Messier, MD     Discharge Diagnoses: Penile abscess in the setting of self ingestion of medication for severe erectile dysfunction secondary to severe uncontrolled diabetes Insulin-dependent type 2 diabetes with hyperglycemia Hypertension Asthma Opioid use disorder  Hospital Course: [name] is a 32 y.o. male with medical history significant of insulin-dependent type 2 diabetes, hypertension, asthma, opioid use disorder on Suboxone, erectile dysfunction presented to ED with swelling of his groin and penis in the setting of using a self injection of Trimix which was prescribed for erectile dysfunction and involves an injection to the base of the penis.  CT pelvis with contrast showing multiloculated abscess at the base of the penis on the right.  Urology saw the patient in the ED and recommended admission for IV antibiotics (Vanc + Zosyn), aggressive glycemic management, and will plan for operative I&D/drain placement hospitalist on board for acute management   Patient underwent irrigation and debridement of abscess on 05/23/2023. Wound culture currently growing gram-positive cocci in clusters and patient was informed to wait until we get identification and sensitivity results however he expressed the fact that he will not be able to wait since he has to go to work to be able to pay his bills and therefore decided to sign out AGAINST MEDICAL ADVICE.  He has promised to follow-up with urology.   Consultants: Urology Procedures performed: Debridement and drainage of perineal abscess Disposition: Home Diet recommendation:  Carb modified diet DISCHARGE MEDICATION: Allergies as of 05/24/2023       Reactions   Vicodin [hydrocodone-acetaminophen] Anaphylaxis   Tolerates tylenol with no allergy    Egg-derived Products Nausea And Vomiting   Lactose Intolerance (gi) Rash        Medication List     STOP taking these medications    DULoxetine 60 MG capsule Commonly known as: CYMBALTA   PENICILLIN V POTASSIUM PO   sildenafil 50 MG tablet Commonly known as: Viagra   tadalafil 10 MG tablet Commonly known as: CIALIS       TAKE these medications    Basaglar KwikPen 100 UNIT/ML Inject 28 Units into the skin daily.   Basaglar KwikPen 100 UNIT/ML Inject 28 Units into the skin daily.   blood glucose meter kit and supplies Kit Use up to four times daily as directed.   buprenorphine-naloxone 8-2 mg Subl SL tablet Commonly known as: SUBOXONE Place 1 tablet under the tongue 3 (three) times daily.   Dexcom G7 Sensor Misc Apply 1 sensor every 10 days   doxycycline 50 MG capsule Commonly known as: VIBRAMYCIN Take 2 capsules (100 mg total) by mouth 2 (two) times daily for 10 days.   Farxiga 5 MG Tabs tablet Generic drug: dapagliflozin propanediol Take 1 tablet (5 mg total) by mouth daily.   freestyle lancets use up to 4 times daily to check blood sugar   B-D ULTRA-FINE 33 LANCETS Misc Use one lancet once daily.   gabapentin 100 MG capsule Commonly known as: Neurontin Take 1 capsule (100 mg total) by mouth 3 (three) times daily.   glucose blood test strip 1 each by Other route 4 (four) times daily. Use as instructed   FREESTYLE LITE test strip Generic drug: glucose blood use up to 4 times daily to check blood sugar   [employer] Blood Glucose Test test  strip Generic drug: glucose blood Use to test once daily.   ibuprofen 400 MG tablet Commonly known as: ADVIL Take 1 tablet (400 mg total) by mouth every 6 (six) hours as needed for mild pain (or Fever >/= 101).   Ozempic (0.25 or 0.5 MG/DOSE) 2 MG/3ML Sopn Generic drug: Semaglutide(0.25 or 0.5MG /DOS) Inject 0.25 mg into the skin once a week.   True Metrix Meter w/Device Kit USE AS DIRECTED   OneTouch  Verio w/Device Kit Use once daily.   Unifine Pentips 32G X 4 MM Misc Generic drug: Insulin Pen Needle Use 4 times daily   PEN NEEDLES 31GX5/16" 31G X 8 MM Misc Use 1 (one) Syringe as directed        Discharge Exam: Filed Weights   05/22/23 1244  Weight: 90.7 kg    General: Seen this morning not in acute distress HENT:     Head: normocephalic/atraumatic Eyes:     Extraocular Movements: Extraocular movements intact.  Cardiovascular:     Rate and Rhythm: Normal rate and regular rhythm.     Pulses: Normal pulses.  Pulmonary:     Effort: Pulmonary effort is normal. No respiratory distress.  Abdominal:     General: Bowel sounds are normal. There is no distension.  Musculoskeletal: No edema moves all extremities nonfocal Genitourinary: Swelling involving the base of the penis with no obvious discharge Skin:    General: Skin is warm and dry.  Neurological:     General: No focal deficit present.     Mental Status: He is alert an  Condition at discharge: good     Discharge time spent:  35 minutes.  Signed: Loyce Dys, MD [facility] 05/24/2023

## 2023-05-24 NOTE — Progress Notes (Signed)
1 Day Post-Op   Subjective/Chief Complaint:   1 - Penile Abscess -now s/p I and D. Culture with GPC in clusters. Still remains afebrile and HDS. Pain well controlled.   Objective: Vital signs in last 24 hours: Temp:  [97.8 F (36.6 C)-98.9 F (37.2 C)] 97.9 F (36.6 C) (07/25 0428) Pulse Rate:  [65-97] 74 (07/25 0428) Resp:  [14-23] 20 (07/25 0428) BP: (103-146)/(65-103) 115/72 (07/25 0428) SpO2:  [96 %-100 %] 100 % (07/25 0428) Last BM Date : 05/23/23  Intake/Output from previous day: 07/24 0701 - 07/25 0700 In: 2182 [P.O.:480; I.V.:1220; IV Piggyback:482] Out: 800 [Urine:800] Intake/Output this shift: No intake/output data recorded.  NAD Non-labored breathing on RA RRR SNTND, No CVAT I and D area well appearing. Loosely approximated with nylon stitches. Penrose drain in place.   Lab Results:  Recent Labs    05/23/23 0509 05/24/23 0433  WBC 9.5 10.9*  HGB 13.7 12.6*  HCT 40.2 37.3*  PLT 320 277   BMET Recent Labs    05/23/23 0509 05/24/23 0433  NA 134* 134*  K 3.3* 4.4  CL 99 101  CO2 28 25  GLUCOSE 186* 234*  BUN 8 10  CREATININE 0.58* 0.54*  CALCIUM 8.7* 8.4*   PT/INR No results for input(s): "LABPROT", "INR" in the last 72 hours. ABG No results for input(s): "PHART", "HCO3" in the last 72 hours.  Invalid input(s): "PCO2", "PO2"  Studies/Results: CT PELVIS W CONTRAST  Result Date: 05/22/2023 CLINICAL DATA:  Soft tissue swelling over mons pubis with penile swelling and ulceration, initial encounter EXAM: CT PELVIS WITH CONTRAST TECHNIQUE: Multidetector CT imaging of the pelvis was performed using the standard protocol following the bolus administration of intravenous contrast. RADIATION DOSE REDUCTION: This exam was performed according to the departmental dose-optimization program which includes automated exposure control, adjustment of the mA and/or kV according to patient size and/or use of iterative reconstruction technique. CONTRAST:   OMNIPAQUE IOHEXOL 300 MG/ML  SOLN COMPARISON:  None Available. FINDINGS: Urinary Tract: Bladder is partially distended. Some wall thickening is noted likely related to incomplete distension. Bowel: No obstructive or inflammatory changes of the visualized bowel are seen. Vascular/Lymphatic: Variant anatomy with a left-sided IVC is noted. Reproductive: Prostate is within normal limits. There is a 5.3 x 3.9 cm enhancing fluid collection identified along the right lateral aspect of the penile shaft at its base consistent with a focal abscess. This extends for approximately 5 cm in craniocaudad projection. Overlying soft tissue edema is noted. No skin wound or drainage is identified. Reactive bilateral inguinal adenopathy is noted. Other:  No free fluid is noted. Musculoskeletal: No bony abnormality is noted. IMPRESSION: Changes consistent with multiloculated abscess at the base of the penis on the right. This does not appear to involve the corpora cavernosa. Electronically Signed   By: Alcide Clever M.D.   On: 05/22/2023 19:51    Anti-infectives: Anti-infectives (From admission, onward)    Start     Dose/Rate Route Frequency Ordered Stop   05/23/23 1200  vancomycin (VANCOREADY) IVPB 1250 mg/250 mL        1,250 mg 166.7 mL/hr over 90 Minutes Intravenous Every 12 hours 05/22/23 2346     05/23/23 0400  piperacillin-tazobactam (ZOSYN) IVPB 3.375 g        3.375 g 12.5 mL/hr over 240 Minutes Intravenous Every 8 hours 05/22/23 2311     05/22/23 2030  piperacillin-tazobactam (ZOSYN) IVPB 3.375 g        3.375 g 100 mL/hr  over 30 Minutes Intravenous  Once 05/22/23 2017 05/22/23 2242   05/22/23 2030  vancomycin (VANCOREADY) IVPB 2000 mg/400 mL        2,000 mg 200 mL/hr over 120 Minutes Intravenous  Once 05/22/23 2022 05/23/23 0123       Assessment/Plan:  Continue penrose. Pt to discharge with penrose in place  Antibiotics  and duration per primary team. Follow up final cultures from I and D Plan to follow  up with [facility] in 3 weeks. Appt requested  Discussed with Dr. Jodie Echevaria MD, PhD 05/24/2023

## 2023-05-24 NOTE — Inpatient Diabetes Management (Signed)
Inpatient Diabetes Program Recommendations  AACE/ADA: New Consensus Statement on Inpatient Glycemic Control (2015)  Target Ranges:  Prepandial:   less than 140 mg/dL      Peak postprandial:   less than 180 mg/dL (1-2 hours)      Critically ill patients:  140 - 180 mg/dL    Latest Reference Range & Units 05/22/23 23:37 05/23/23 03:54 05/23/23 07:47 05/23/23 12:05 05/23/23 13:53 05/23/23 15:37 05/23/23 17:15 05/23/23 20:14  Glucose-Capillary 70 - 99 mg/dL 657 (H) 846 (H) 962 (H) 135 (H) 179 (H) 152 (H) 310 (H) 343 (H)  (H): Data is abnormally high  Latest Reference Range & Units 05/24/23 00:11 05/24/23 04:25 05/24/23 08:05 05/24/23 11:43  Glucose-Capillary 70 - 99 mg/dL 952 (H) 841 (H) 324 (H) 217 (H)  (H): Data is abnormally high     Home DM Meds:  Basaglar 28 units QAM        Ozempic 0.25 mg Qweek   Current Orders: Semglee 14 units Daily Novolog 0-9 units Q4 hours    Current A1c was 11% (June 2024 at PCP appt)--Pt saw PCP Dr. Cliffton Asters w/ [facility]--A1c rose from 9.1% to 11%--Per PCP notes, pt had just started back on Basaglar 1 week prior to visit--PCP re-prescribed Ozempic 0.25 mg Qweek     MD- Lantus NOT given yest AM b/c pt was NPO  CBGs elevated since 4am today--pt got Semglee insulin this AM  Please consider:  1. Increase Semglee to 20 units Daily  2. Start Novolog Meal Coverage: Novolog 4 units TID with meals HOLD if pt NPO HOLD if pt eats <50% meals     --Will follow patient during hospitalization--  Ambrose Finland RN, MSN, CDCES Diabetes Coordinator Inpatient Glycemic Control Team Team Pager: [phone] (8a-5p)

## 2023-05-25 ENCOUNTER — Telehealth: Payer: Self-pay

## 2023-05-25 NOTE — Op Note (Signed)
NAME: [name], [name] MEDICAL RECORD NO: [id number] ACCOUNT NO: [id number] DATE OF BIRTH: 1991-10-11 PHYSICIAN: Sebastian Ache, MD  Operative Report   DATE OF PROCEDURE: 05/23/2023  PREOPERATIVE DIAGNOSES:  Large penile abscess, severe diabetes.  PROCEDURE:  Incision and drainage of penile abscess.  ESTIMATED BLOOD LOSS:  20 mL  COMPLICATIONS:  None.  SPECIMEN:  Abscess fluid for Gram stain and culture.  FINDINGS:  Very large loculated penile abscess, mostly involving the left base penile scrotal tissue with encroachment on the mons.  No evidence of urethral or corporal involvement.  DRAINS:  Penrose drain to wound drainage.  INDICATIONS:  The patient is a 32 year old man with history of severe diabetes and sequela including severe erectile dysfunction.  He manages this with penile injections.  He performed this approximately 4 to 5 days ago after which he noted increasing  swelling and pain and induration in the area. He presented to the emergency room yesterday and CT imaging revealed a fairly large but fortunately superficial abscess.  He was not floridly septic, but glucose was in the 300s.  He was admitted, placed on  medical therapy with plan for incision and drainage in the semi-urgent setting, and he presents for this now.  Informed consent was obtained and placed in medical record.  PROCEDURE IN DETAIL:  The patient was verified, procedure being incision and drainage of large penile abscess was confirmed.  Procedure timeout was performed.  Intravenous antibiotics were verified.  General endotracheal anesthesia induced.  The patient  was placed in lithotomy position.  Sterile field was created, prepped and draped the patient's penis, perineum, proximal thighs, scrotum and mons area using iodine.  Foley catheter was placed per urethra to allow palpable urethral identification.   Incision was made approximately 4 cm in length at the area of maximum induration in the left dorsal  base of the penile shaft and a very large pocket of purulence was immediately entered. Gram stain and culture swabs were obtained from this fluid which  was expressed.  There was at least 100 mL of frank purulence.  The area was copiously irrigated with a Pulsavac device, shows complete resolution of all obvious purulent material.  The entire wound was digitally inspected and fortunately there was no  palpable evidence of corporal involvement, true scrotal compartment involvement or inguinal ring involvement.  This was quite favorable. A small counter incision was made in the dependent most aspect of this, which was in the superficial scrotal tissue  but not within the scrotal compartment itself through which a 1/4-inch Penrose drain was placed, such that it traversed in inferior counterincision and the primary incision.  The sutures were placed x 2 with nylon and the initial incision was loosely  reapproximated using interrupted nylon x 2, which resulted in acceptable but purposely loose tissue reapproximation.  We achieved the goals of tissue debridement and drainage as the urethra and corporal bodies were not grossly involved, decision made to  remove the catheter at the end of the case.  A dressing of ABD pad and mesh underwear was placed after the wound area was instilled with approximately 20 mL of Marcaine.  The patient was taken to postanesthesia care unit in stable condition.  Plan for  continued inpatient admission.   SHW D: 05/23/2023 3:31:45 pm T: 05/23/2023 9:45:00 pm  JOB: [id number]/ [id number]

## 2023-05-25 NOTE — Transitions of Care (Post Inpatient/ED Visit) (Signed)
05/25/2023  Name: [name] MRN: [id number] DOB: [birth year]  Today's TOC FU Call Status: Today's TOC FU Call Status:: Successful TOC FU Call Competed TOC FU Call Complete Date: 05/25/23  Transition Care Management Follow-up Telephone Call Date of Discharge: 05/24/23 Discharge Facility: [facility]) Type of Discharge: Inpatient Admission Primary Inpatient Discharge Diagnosis:: abscess How have you been since you were released from the hospital?: Better Any questions or concerns?: No  Items Reviewed: Did you receive and understand the discharge instructions provided?: Yes Medications obtained,verified, and reconciled?: Yes (Medications Reviewed) Any new allergies since your discharge?: No Dietary orders reviewed?: Yes Do you have support at home?: Yes People in Home: parent(s)  Medications Reviewed Today: Medications Reviewed Today     Reviewed by Karena Addison, LPN (Licensed Practical Nurse) on 05/25/23 at 0840  Med List Status: <None>   Medication Order Taking? Sig Documenting Provider Last Dose Status Informant  B-D ULTRA-FINE 33 LANCETS MISC [id number]  Use one lancet once daily.   Active Self  blood glucose meter kit and supplies KIT [id number]  Use up to four times daily as directed. Doran Stabler, DO  Active Self  Blood Glucose Monitoring Suppl [facility] VERIO) w/Device KIT [id number]  Use once daily.   Active Self  Blood Glucose Monitoring Suppl (TRUE METRIX METER) w/Device KIT [id number] No USE AS DIRECTED Miguel Aschoff, MD Taking Active Self  buprenorphine-naloxone (SUBOXONE) 8-2 mg SUBL SL tablet [id number] No Place 1 tablet under the tongue 3 (three) times daily. Reymundo Poll, MD 05/21/2023 Active Self  Continuous Blood Gluc Sensor (DEXCOM G7 SENSOR) MISC [id number]  Apply 1 sensor every 10 days Doran Stabler, DO  Active Self  dapagliflozin propanediol (FARXIGA) 5 MG TABS tablet [id number] No Take 1 tablet (5 mg total) by mouth daily.  Patient not  taking: Reported on 05/22/2023    Completed Course Active Self  doxycycline (VIBRAMYCIN) 50 MG capsule [id number]  Take 2 capsules (100 mg total) by mouth 2 (two) times daily for 10 days. Loyce Dys, MD  Active   gabapentin (NEURONTIN) 100 MG capsule [id number] No Take 1 capsule (100 mg total) by mouth 3 (three) times daily. Carmel Sacramento, MD Taking Expired 12/29/22 2359 Self  glucose blood (FREESTYLE LITE) test strip [id number]  use up to 4 times daily to check blood sugar   Active Self  glucose blood ([employer] BLOOD GLUCOSE TEST) test strip [id number]  Use to test once daily.   Active Self  glucose blood test strip [id number]  1 each by Other route 4 (four) times daily. Use as instructed Doran Stabler, DO  Active Self  ibuprofen (ADVIL) 400 MG tablet [id number]  Take 1 tablet (400 mg total) by mouth every 6 (six) hours as needed for mild pain (or Fever >/= 101). Loyce Dys, MD  Active   Insulin Glargine [facility] KWIKPEN) 100 UNIT/ML [id number]  Inject 28 Units into the skin daily. Doran Stabler, DO  Expired 10/25/22 2359   Insulin Glargine (BASAGLAR KWIKPEN) 100 UNIT/ML [id number] No Inject 28 Units into the skin daily.  05/21/2023 Active Self  Insulin Pen Needle (PEN NEEDLES 31GX5/16") 31G X 8 MM MISC [id number]  Use 1 (one) Syringe as directed   Active Self  Insulin Pen Needle (UNIFINE PENTIPS) 32G X 4 MM MISC [id number]  Use 4 times daily Doran Stabler, DO  Active Self  Lancets (FREESTYLE) lancets [id number]  use up to 4 times daily to check blood sugar   Active Self  Semaglutide,0.25 or 0.5MG /DOS, (OZEMPIC, 0.25  OR 0.5 MG/DOSE,) 2 MG/3ML SOPN [id number] No Inject 0.25 mg into the skin once a week.  Patient not taking: Reported on 05/22/2023   Adron Bene, MD Not Taking Active Self            Home Care and Equipment/Supplies: Were Home Health Services Ordered?: NA Any new equipment or medical supplies ordered?: NA  Functional Questionnaire: Do you need assistance with bathing/showering or  dressing?: No Do you need assistance with meal preparation?: No Do you need assistance with eating?: No Do you have difficulty maintaining continence: No Do you need assistance with getting out of bed/getting out of a chair/moving?: No Do you have difficulty managing or taking your medications?: No  Follow up appointments reviewed: PCP Follow-up appointment confirmed?: No (no avail appt times, sent message to staff to schedule) MD Provider Line Number:[phone] Given: No Specialist Hospital Follow-up appointment confirmed?: NA Do you need transportation to your follow-up appointment?: No Do you understand care options if your condition(s) worsen?: Yes-patient verbalized understanding    SIGNATURE Karena Addison, LPN [facility] Nurse Health Advisor Direct Dial [phone]

## 2023-05-29 ENCOUNTER — Encounter: Payer: Commercial Managed Care - HMO | Admitting: Internal Medicine

## 2023-05-29 NOTE — Progress Notes (Deleted)
4wk f/u  Asthma  T2DM Last HbA1c on *** was ***. Current regimen is basaglar 28 units daily, ozempic 0.25 mg weekly***. He does*** check CBG at home and they have been running ***. Urine microalbumin-creatinine ratio checked at last OV ***. Plan:  Neuropathy Gabapentin   Anxiety Mdd  OUD Tolerating current dose well without cravings or relapses***. 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 Current dose of suboxone is 8-2 mg TID. PDMP reviewed and appropriate, last fill 04/20/2023.  Plan:

## 2023-06-08 ENCOUNTER — Other Ambulatory Visit: Payer: Self-pay | Admitting: Internal Medicine

## 2023-06-08 ENCOUNTER — Other Ambulatory Visit (HOSPITAL_COMMUNITY): Payer: Self-pay

## 2023-06-08 DIAGNOSIS — F1111 Opioid abuse, in remission: Secondary | ICD-10-CM

## 2023-06-08 MED ORDER — BUPRENORPHINE HCL-NALOXONE HCL 8-2 MG SL SUBL
1.0000 | SUBLINGUAL_TABLET | Freq: Three times a day (TID) | SUBLINGUAL | 0 refills | Status: DC
Start: 2023-06-08 — End: 2023-07-11
  Filled 2023-06-08: qty 90, 30d supply, fill #0

## 2023-06-08 NOTE — Telephone Encounter (Signed)
Will refill, given risk of patient not having this medication. Last Toxassure in 6/17 within expected limits. Pt does need a follow up appointment in one month for repeat toxassure.

## 2023-06-08 NOTE — Telephone Encounter (Signed)
Pt needs a f/u appt per Dr Nooruddin. Thanks

## 2023-06-08 NOTE — Telephone Encounter (Signed)
Last rx written -04/16/23. Last OV - 04/16/23. Next OV - has not been scheduled. TOX - 04/16/23.

## 2023-06-12 ENCOUNTER — Other Ambulatory Visit (HOSPITAL_COMMUNITY): Payer: Self-pay

## 2023-06-12 MED ORDER — SULFAMETHOXAZOLE-TRIMETHOPRIM 800-160 MG PO TABS
1.0000 | ORAL_TABLET | Freq: Two times a day (BID) | ORAL | 0 refills | Status: DC
  Filled 2023-06-12: qty 14, 7d supply, fill #0

## 2023-07-11 ENCOUNTER — Other Ambulatory Visit (HOSPITAL_COMMUNITY): Payer: Self-pay

## 2023-07-11 ENCOUNTER — Other Ambulatory Visit: Payer: Self-pay | Admitting: Student

## 2023-07-11 DIAGNOSIS — F1111 Opioid abuse, in remission: Secondary | ICD-10-CM

## 2023-07-11 MED ORDER — BUPRENORPHINE HCL-NALOXONE HCL 8-2 MG SL SUBL
1.0000 | SUBLINGUAL_TABLET | Freq: Three times a day (TID) | SUBLINGUAL | 0 refills | Status: DC
Start: 2023-07-11 — End: 2023-08-12
  Filled 2023-07-11: qty 90, 30d supply, fill #0

## 2023-07-17 NOTE — Telephone Encounter (Signed)
Patient sent message via my chart to contact our office to schedule an appointment. 

## 2023-07-22 ENCOUNTER — Other Ambulatory Visit: Payer: Self-pay

## 2023-07-22 ENCOUNTER — Emergency Department (HOSPITAL_COMMUNITY): Payer: Commercial Managed Care - HMO

## 2023-07-22 ENCOUNTER — Emergency Department (HOSPITAL_COMMUNITY)
Admission: EM | Admit: 2023-07-22 | Discharge: 2023-07-22 | Disposition: A | Discharge status: Home / Self Care | Payer: Commercial Managed Care - HMO | Attending: Emergency Medicine | Admitting: Emergency Medicine

## 2023-07-22 DIAGNOSIS — J45909 Unspecified asthma, uncomplicated: Secondary | ICD-10-CM | POA: Insufficient documentation

## 2023-07-22 DIAGNOSIS — E119 Type 2 diabetes mellitus without complications: Secondary | ICD-10-CM | POA: Insufficient documentation

## 2023-07-22 DIAGNOSIS — R0789 Other chest pain: Secondary | ICD-10-CM | POA: Insufficient documentation

## 2023-07-22 DIAGNOSIS — Z794 Long term (current) use of insulin: Secondary | ICD-10-CM | POA: Diagnosis not present

## 2023-07-22 DIAGNOSIS — I10 Essential (primary) hypertension: Secondary | ICD-10-CM | POA: Insufficient documentation

## 2023-07-22 DIAGNOSIS — Z79899 Other long term (current) drug therapy: Secondary | ICD-10-CM | POA: Insufficient documentation

## 2023-07-22 DIAGNOSIS — R1110 Vomiting, unspecified: Secondary | ICD-10-CM | POA: Insufficient documentation

## 2023-07-22 LAB — COMPREHENSIVE METABOLIC PANEL
ALT: 14 U/L (ref 0–44)
AST: 18 U/L (ref 15–41)
Albumin: 4.1 g/dL (ref 3.5–5.0)
Alkaline Phosphatase: 98 U/L (ref 38–126)
Anion gap: 10 (ref 5–15)
BUN: 10 mg/dL (ref 6–20)
CO2: 27 mmol/L (ref 22–32)
Calcium: 9 mg/dL (ref 8.9–10.3)
Chloride: 102 mmol/L (ref 98–111)
Creatinine, Ser: 0.56 mg/dL — ABNORMAL LOW (ref 0.61–1.24)
GFR, Estimated: >60 mL/min (ref >60)
Glucose, Bld: 202 mg/dL — ABNORMAL HIGH (ref 70–99)
Potassium: 3.9 mmol/L (ref 3.5–5.1)
Sodium: 139 mmol/L (ref 135–145)
Total Bilirubin: 0.7 mg/dL (ref 0.3–1.2)
Total Protein: 7.8 g/dL (ref 6.5–8.1)

## 2023-07-22 LAB — CBC
HCT: 43.4 % (ref 39.0–52.0)
Hemoglobin: 14.9 g/dL (ref 13.0–17.0)
MCH: 30 pg (ref 26.0–34.0)
MCHC: 34.3 g/dL (ref 30.0–36.0)
MCV: 87.3 fL (ref 80.0–100.0)
Platelets: 258 10*3/uL (ref 150–400)
RBC: 4.97 MIL/uL (ref 4.22–5.81)
RDW: 11.6 % (ref 11.5–15.5)
WBC: 6.5 10*3/uL (ref 4.0–10.5)
nRBC: 0 % (ref 0.0–0.2)

## 2023-07-22 LAB — TROPONIN I (HIGH SENSITIVITY)
Troponin I (High Sensitivity): <2 ng/L (ref <18)
Troponin I (High Sensitivity): <2 ng/L (ref <18)

## 2023-07-22 LAB — D-DIMER, QUANTITATIVE: D-Dimer, Quant: <0.27 ug/mL-FEU (ref 0.00–0.50)

## 2023-07-22 LAB — LIPASE, BLOOD: Lipase: 18 U/L (ref 11–51)

## 2023-07-22 MED ORDER — KETOROLAC TROMETHAMINE 30 MG/ML IJ SOLN
30.0000 mg | Freq: Once | INTRAMUSCULAR | Status: AC
  Administered 2023-07-22: 30 mg via INTRAVENOUS
  Filled 2023-07-22: qty 1

## 2023-07-22 MED ORDER — SODIUM CHLORIDE 0.9 % IV BOLUS
1000.0000 mL | Freq: Once | INTRAVENOUS | Status: AC
  Administered 2023-07-22: 1000 mL via INTRAVENOUS

## 2023-07-22 MED ORDER — LIDOCAINE 5 % EX PTCH
1.0000 | MEDICATED_PATCH | CUTANEOUS | Status: DC
  Administered 2023-07-22: 1 via TRANSDERMAL
  Filled 2023-07-22: qty 1

## 2023-07-22 MED ORDER — ONDANSETRON HCL 4 MG/2ML IJ SOLN
4.0000 mg | Freq: Once | INTRAMUSCULAR | Status: AC
  Administered 2023-07-22: 4 mg via INTRAVENOUS
  Filled 2023-07-22: qty 2

## 2023-07-22 MED ORDER — MAALOX MAX 400-400-40 MG/5ML PO SUSP: 10.0000 mL | Freq: Four times a day (QID) | ORAL | 0 refills | Status: DC | PRN

## 2023-07-22 MED ORDER — ALUM & MAG HYDROXIDE-SIMETH 200-200-20 MG/5ML PO SUSP
30.0000 mL | Freq: Once | ORAL | Status: AC
  Administered 2023-07-22: 30 mL via ORAL
  Filled 2023-07-22: qty 30

## 2023-07-22 MED ORDER — PANTOPRAZOLE SODIUM 40 MG IV SOLR
40.0000 mg | Freq: Once | INTRAVENOUS | Status: AC
  Administered 2023-07-22: 40 mg via INTRAVENOUS
  Filled 2023-07-22: qty 10

## 2023-07-22 NOTE — ED Notes (Signed)
Assumed care of pt,found him asleep in bed.  Pt quickly became alert and oriented answering all questions.  Reports he continues to have a pain score of 6/10. No issues or concerns at this time.

## 2023-07-22 NOTE — ED Provider Notes (Signed)
[name] EMERGENCY DEPARTMENT AT [facility] Provider Note   CSN: [id number] Arrival date & time: 07/22/23  1452     History  Chief Complaint  Patient presents with   Chest Pain    [name] is a 32 y.o. male with history of diabetes mellitus, hypertension, and asthma who presents to the ED today for chest pain.  Patient reports that he has been having persistent, sharp pain at the right side of his sternum since this morning with associated vomiting.  Pain is worse with deep inspiration.  Denies fever, dyspnea, abdominal pain, diarrhea, or dysuria. Pain does not radiate to the back. He has not tried any medications to help with the pain prior to arrival. No additional complaints or concerns at this time.    Home Medications Prior to Admission medications   Medication Sig Start Date End Date Taking? Authorizing Provider  alum & mag hydroxide-simeth (MAALOX MAX) 400-400-40 MG/5ML suspension Take 10 mLs by mouth every 6 (six) hours as needed for indigestion. 07/22/23 08/21/23 Yes Maxwell Marion, PA-C  B-D ULTRA-FINE 33 LANCETS MISC Use one lancet once daily. 06/08/22     blood glucose meter kit and supplies KIT Use up to four times daily as directed. 05/17/22   Doran Stabler, DO  Blood Glucose Monitoring Suppl (ONETOUCH VERIO) w/Device KIT Use once daily. 06/08/22     Blood Glucose Monitoring Suppl (TRUE METRIX METER) w/Device KIT USE AS DIRECTED 06/22/21   Miguel Aschoff, MD  buprenorphine-naloxone (SUBOXONE) 8-2 mg SUBL SL tablet Place 1 tablet under the tongue 3 (three) times daily. 07/11/23   Nooruddin, Jason Fila, MD  Continuous Blood Gluc Sensor (DEXCOM G7 SENSOR) MISC Apply 1 sensor every 10 days 03/16/22   Doran Stabler, DO  dapagliflozin propanediol (FARXIGA) 5 MG TABS tablet Take 1 tablet (5 mg total) by mouth daily. Patient not taking: Reported on 05/22/2023 06/08/22     gabapentin (NEURONTIN) 100 MG capsule Take 1 capsule (100 mg total) by mouth 3 (three) times daily.  01/03/22 12/29/22  Carmel Sacramento, MD  glucose blood (FREESTYLE LITE) test strip use up to 4 times daily to check blood sugar 05/17/22     glucose blood ([employer] BLOOD GLUCOSE TEST) test strip Use to test once daily. 06/08/22     glucose blood test strip 1 each by Other route 4 (four) times daily. Use as instructed 03/16/22   Doran Stabler, DO  ibuprofen (ADVIL) 400 MG tablet Take 1 tablet (400 mg total) by mouth every 6 (six) hours as needed for mild pain (or Fever >/= 101). 05/24/23   Loyce Dys, MD  Insulin Glargine (BASAGLAR KWIKPEN) 100 UNIT/ML Inject 28 Units into the skin daily. 05/17/22 10/25/22  Doran Stabler, DO  Insulin Glargine [facility]) 100 UNIT/ML Inject 28 Units into the skin daily. 06/08/22     Insulin Pen Needle (PEN NEEDLES 31GX5/16") 31G X 8 MM MISC Use 1 (one) Syringe as directed 06/08/22     Insulin Pen Needle (UNIFINE PENTIPS) 32G X 4 MM MISC Use 4 times daily 03/16/22   Doran Stabler, DO  Lancets (FREESTYLE) lancets use up to 4 times daily to check blood sugar 05/17/22     Semaglutide,0.25 or 0.5MG /DOS, (OZEMPIC, 0.25 OR 0.5 MG/DOSE,) 2 MG/3ML SOPN Inject 0.25 mg into the skin once a week. Patient not taking: Reported on 05/22/2023 04/16/23   Adron Bene, MD  sulfamethoxazole-trimethoprim (BACTRIM DS) 800-160 MG tablet Take 1 tablet by mouth 2 (two) times daily. 06/12/23  Allergies    Vicodin [hydrocodone-acetaminophen], Egg-derived products, and Lactose intolerance (gi)    Review of Systems   Review of Systems  Cardiovascular:  Positive for chest pain.  All other systems reviewed and are negative.   Physical Exam Updated Vital Signs BP (!) 166/96   Pulse 77   Temp 98.8 F (37.1 C) (Oral)   Resp 16   Ht 5\' 6"  (1.676 m)   Wt 90 kg   SpO2 99%   BMI 32.02 kg/m  Physical Exam Vitals and nursing note reviewed.  Constitutional:      Appearance: Normal appearance.  HENT:     Head: Normocephalic and atraumatic.     Mouth/Throat:     Mouth: Mucous membranes  are moist.  Eyes:     Conjunctiva/sclera: Conjunctivae normal.     Pupils: Pupils are equal, round, and reactive to light.  Cardiovascular:     Rate and Rhythm: Normal rate and regular rhythm.     Pulses: Normal pulses.     Heart sounds: Normal heart sounds.  Pulmonary:     Effort: Pulmonary effort is normal.     Breath sounds: Normal breath sounds.  Abdominal:     Palpations: Abdomen is soft.     Tenderness: There is no abdominal tenderness.  Skin:    General: Skin is warm and dry.     Findings: No rash.  Neurological:     General: No focal deficit present.     Mental Status: He is alert.     Sensory: No sensory deficit.     Motor: No weakness.  Psychiatric:        Mood and Affect: Mood normal.        Behavior: Behavior normal.     ED Results / Procedures / Treatments   Labs (all labs ordered are listed, but only abnormal results are displayed) Labs Reviewed  COMPREHENSIVE METABOLIC PANEL - Abnormal; Notable for the following components:      Result Value   Glucose, Bld 202 (*)    Creatinine, Ser 0.56 (*)    All other components within normal limits  CBC  LIPASE, BLOOD  D-DIMER, QUANTITATIVE  TROPONIN I (HIGH SENSITIVITY)  TROPONIN I (HIGH SENSITIVITY)    EKG EKG Interpretation Date/Time:  Sunday July 22 2023 15:17:45 EDT Ventricular Rate:  92 PR Interval:  139 QRS Duration:  132 QT Interval:  373 QTC Calculation: 462 R Axis:   -78  Text Interpretation: 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 FINDINGS: Evaluation is limited by rotation. The cardiomediastinal silhouette is unchanged in contour. No pleural effusion. No pneumothorax. No acute pleuroparenchymal abnormality. IMPRESSION: No acute  cardiopulmonary abnormality. Electronically Signed   By: Meda Klinefelter M.D.   On: 07/22/2023 17:01    Procedures Procedures: not indicated.   Medications Ordered in ED Medications  lidocaine (LIDODERM) 5 % 1 patch (1 patch Transdermal Patch Applied 07/22/23 1953)  ketorolac (TORADOL) 30 MG/ML injection 30 mg (30 mg Intravenous Given 07/22/23 1628)  ondansetron (ZOFRAN) injection 4 mg (4 mg Intravenous Given 07/22/23 1628)  sodium chloride 0.9 % bolus 1,000 mL (0 mLs Intravenous Stopped 07/22/23 1743)  alum & mag hydroxide-simeth (MAALOX/MYLANTA) 200-200-20 MG/5ML suspension 30 mL (30 mLs Oral Given 07/22/23 1741)  pantoprazole (PROTONIX) injection 40 mg (40 mg Intravenous Given 07/22/23 1845)    ED Course/ Medical Decision Making/ A&P                                 Medical Decision Making Amount and/or Complexity of Data Reviewed Labs: ordered. Radiology: ordered.  Risk OTC drugs. Prescription drug management.   This patient presents to the ED for concern of chest pain, this involves an extensive number of treatment options, and is a complaint that carries with it a high risk of complications and morbidity.   Differential diagnosis includes: ACS, pneumonia, pneumothorax, pleurisy, costochondritis, GERD, gastritis, etc.   Comorbidities  See HPI above   Additional History  Additional history obtained from patient's previous records.   Cardiac Monitoring / EKG  The patient was maintained on a cardiac monitor.  I personally viewed and interpreted the cardiac monitored which showed: sinus rhythm with a heart rate of 92 bpm.   Lab Tests  I ordered and personally interpreted labs.  The pertinent results include:   Negative troponin Evaluated glucose 202, decreased Cr 0.56, other CMP is unremarkable No signs of infection or anemia on CBC Lipase is unremarkable. D-dimer is negative.   Imaging Studies  I ordered imaging studies including CXR  I independently  visualized and interpreted imaging which showed: no acute cardiopulmonary disease. I agree with the radiologist interpretation   Problem List / ED Course / Critical Interventions / Medication Management  Chest pain I ordered medications including: Toradol, Zofran, and NS for pain  Reevaluation of the patient after these medicines showed that the patient stayed the same.  Denies improvement with the medication.  Maalox ordered. Patient reports some improvement with Maalox provided some relief. I then ordered Pantoprazole which he said didn't really make a difference. I then ordered a lidocaine patch. I have reviewed the patients home medicines and have made adjustments as needed.   Social Determinants of Health  Tobacco use   Test / Admission - Considered  Discussed results with patient. All questions were answered. He is hemodynamically stable and safe for discharge home. Prescription for Maalox to the pharmacy. Return precautions provided.       Final Clinical Impression(s) / ED Diagnoses Final diagnoses:  Atypical chest pain    Rx / DC Orders ED Discharge Orders          Ordered    alum & mag hydroxide-simeth (MAALOX MAX) 400-400-40 MG/5ML suspension  Every 6 hours PRN        07/22/23 1929              Maxwell Marion, PA-C 07/22/23 2042    Pricilla Loveless, MD 07/22/23 2232

## 2023-07-22 NOTE — ED Notes (Signed)
Provider bedside.

## 2023-07-22 NOTE — ED Notes (Signed)
RN called lab, they will run d-dimer w/ blood in the lab.

## 2023-07-22 NOTE — Discharge Instructions (Addendum)
As discussed, your labs and imaging rule out heart attack or possible blood clot.  Your chest pain could be due to inflammation of the chest wall or acid reflux. I have sent a prescription of Maalox to your pharmacy.  You can take this medication every 6 hours as needed for the pain.  You can also pick up lidocaine patches over-the-counter.  You can apply them once a day to the area of pain.  Follow-up with your primary care provider in the next 3 to 5 days for reevaluation. I have provided you with information for cardiology if you would like to follow up with them for further evaluation of your atypical chest pain.  Get help right away if: Your chest pain is worse. You have a cough that gets worse, or you cough up blood. You have very bad (severe) pain in your belly (abdomen). You pass out (faint). You have either of these for no clear reason: Sudden chest discomfort. Sudden discomfort in your arms, back, neck, or jaw. You have shortness of breath at any time. You suddenly start to sweat, or your skin gets clammy. You feel sick to your stomach (nauseous). You throw up (vomit). You suddenly feel lightheaded or dizzy. You feel very weak or tired. Your heart starts to beat fast, or it feels like it is skipping beats.

## 2023-07-22 NOTE — ED Triage Notes (Signed)
Pt c/o chest pain and vomiting since this morning

## 2023-07-24 ENCOUNTER — Other Ambulatory Visit (HOSPITAL_COMMUNITY): Payer: Self-pay

## 2023-07-26 ENCOUNTER — Ambulatory Visit (HOSPITAL_COMMUNITY)
Admission: EM | Admit: 2023-07-26 | Discharge: 2023-07-26 | Disposition: A | Discharge status: Home / Self Care | Payer: Managed Care, Other (non HMO) | Attending: Family Medicine | Admitting: Family Medicine

## 2023-07-26 ENCOUNTER — Encounter (HOSPITAL_COMMUNITY): Payer: Self-pay

## 2023-07-26 ENCOUNTER — Other Ambulatory Visit (HOSPITAL_COMMUNITY): Payer: Self-pay

## 2023-07-26 DIAGNOSIS — I10 Essential (primary) hypertension: Secondary | ICD-10-CM | POA: Diagnosis not present

## 2023-07-26 DIAGNOSIS — R112 Nausea with vomiting, unspecified: Secondary | ICD-10-CM

## 2023-07-26 DIAGNOSIS — R0600 Dyspnea, unspecified: Secondary | ICD-10-CM

## 2023-07-26 DIAGNOSIS — R0789 Other chest pain: Secondary | ICD-10-CM

## 2023-07-26 MED ORDER — ONDANSETRON HCL 4 MG PO TABS
4.0000 mg | ORAL_TABLET | Freq: Three times a day (TID) | ORAL | 0 refills | Status: DC | PRN
  Filled 2023-07-26: qty 20, 7d supply, fill #0

## 2023-07-26 MED ORDER — CARVEDILOL 6.25 MG PO TABS
6.2500 mg | ORAL_TABLET | Freq: Two times a day (BID) | ORAL | 0 refills | Status: DC
  Filled 2023-07-26: qty 60, 30d supply, fill #0

## 2023-07-26 MED ORDER — FAMOTIDINE 20 MG PO TABS
20.0000 mg | ORAL_TABLET | Freq: Two times a day (BID) | ORAL | 0 refills | Status: DC
  Filled 2023-07-26: qty 30, 15d supply, fill #0

## 2023-07-26 NOTE — ED Triage Notes (Signed)
Patient here today with c/o acid reflux and chest tightness X 4 days. He has also had some loss of appetite and SOB. Sunday he had vomited. Has been nauseous since. He has also been having some diarrhea. He went to ED 4 days ago. Has been taking Maalox with no relief.

## 2023-07-26 NOTE — ED Provider Notes (Signed)
[facility]    CSN: [id number] Arrival date & time: 07/26/23  1322      History   Chief Complaint Chief Complaint  Patient presents with   Gastroesophageal Reflux    HPI [name] is a 32 y.o. male.  Patient with a history of type 2 insulin-dependent diabetes, hypertension, asthma, opiate use disorder,  presents today with chest tightness x 4 days, poor appetite and shortness of breath.  Patient reports that he last vomited 4 days ago however has been nauseous since that time.  He is also been taking Maalox which was prescribed to him at the ED without improvement of symptoms.  Of note patient is hypertensive on arrival with a blood pressure of 160/106.  Patient was also hypertensive when he was last seen in the emergency department.  On chart review patient at some point was prescribed lisinopril 40 mg daily for management of hypertension however patient reports he has not taken lisinopril in several years.  On repeat with a manual cuff patient blood pressure remains elevated 160/92. He reports chest pain has remain persistent since his ED visit 4 days ago.  He characterizes the chest pain as a burning sensation but also reports heavy sensation on his chest as well.  The pain does not wax and wane and is persistent. Patient has no swelling of the extremities although endorses sensation in his abdomen.  He has been taking the Maalox which was prescribed to him from the ED without any improvement in GI symptoms and has continued to have nausea although has not vomited over the last 4 days.  Denies any other symptoms such as abdominal pain, diarrhea, weakness or generalized bodyaches.  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   Smoking 08/01/2017    Past Surgical History:  Procedure Laterality Date   ADENOIDECTOMY     INCISION AND DRAINAGE ABSCESS Right 01/24/2022   Procedure: INCISION AND DRAINAGE ABSCESS;  Surgeon: Netta Cedars, MD;  Location: [facility];  Service: Orthopedics;  Laterality: Right;   IRRIGATION AND DEBRIDEMENT ABSCESS N/A 05/23/2023   Procedure: IRRIGATION AND DEBRIDEMENT ABSCESS, PENILE ABSCESS;  Surgeon: Loletta Parish., MD;  Location: [facility];  Service: Urology;  Laterality: N/A;   LACERATION REPAIR Left    "stitched finger up"   TONSILLECTOMY         Home Medications    Prior to Admission medications   Medication Sig Start Date End Date Taking? Authorizing Provider  carvedilol (COREG) 6.25 MG tablet Take 1 tablet (6.25 mg total) by mouth 2 (two) times daily with a meal. 07/26/23 08/25/23 Yes Bing Neighbors, NP  famotidine (PEPCID) 20 MG tablet Take 1 tablet (20 mg total) by mouth 2 (two) times daily. 07/26/23  Yes Bing Neighbors, NP  ondansetron (ZOFRAN) 4 MG tablet Take 1 tablet (4 mg total) by mouth every 8 (eight) hours as needed for nausea or vomiting. 07/26/23  Yes Bing Neighbors, NP  B-D ULTRA-FINE 33 LANCETS MISC Use one lancet once daily. 06/08/22     blood glucose meter kit and supplies KIT Use up to four times daily as directed. 05/17/22   Doran Stabler, DO  Blood Glucose Monitoring Suppl (ONETOUCH VERIO) w/Device KIT Use once daily. 06/08/22     Blood Glucose Monitoring Suppl (TRUE METRIX METER) w/Device KIT USE AS DIRECTED 06/22/21   Miguel Aschoff, MD  buprenorphine-naloxone (SUBOXONE) 8-2 mg SUBL SL tablet Place 1 tablet under the tongue 3 (three) times daily. 07/11/23   Nooruddin, Jason Fila, MD  Continuous  Blood Gluc Sensor (DEXCOM G7 SENSOR) MISC Apply 1 sensor every 10 days 03/16/22   Doran Stabler, DO  glucose blood (FREESTYLE LITE) test strip use up to 4 times daily to check blood sugar 05/17/22     glucose blood ([employer] BLOOD GLUCOSE TEST) test strip Use to test once daily. 06/08/22     glucose blood test strip 1 each by Other route 4 (four) times daily. Use as instructed 03/16/22   Doran Stabler, DO  Insulin Glargine [facility]) 100 UNIT/ML Inject 28 Units into the skin daily. 05/17/22 10/25/22  Doran Stabler, DO  Insulin Glargine [facility]) 100 UNIT/ML Inject 28 Units into the skin daily. 06/08/22     Insulin Pen Needle (PEN NEEDLES 31GX5/16") 31G X 8 MM MISC Use 1 (one) Syringe as directed 06/08/22     Insulin Pen Needle (UNIFINE PENTIPS) 32G X 4 MM MISC Use 4 times daily 03/16/22   Doran Stabler, DO  Lancets (FREESTYLE) lancets use up to 4 times daily to check blood sugar 05/17/22       Family History History reviewed. No pertinent family history.  Social History Social History   Tobacco Use   Smoking status: Some Days    Current packs/day: 0.50    Average packs/day: 0.5 packs/day for 12.0 years (6.0 ttl pk-yrs)    Types: Cigarettes   Smokeless tobacco: Never   Tobacco comments:    0.5 PPD  Vaping Use   Vaping status: Never Used  Substance Use Topics   Alcohol use: Not Currently    Comment: OCCASSIONAL   Drug use: Not Currently    Types: Marijuana    Comment: 10/17/2017 ~3 times per week     Allergies   Vicodin [hydrocodone-acetaminophen], Egg-derived products, and Lactose intolerance (gi)   Review of Systems Review of Systems Pertinent negatives listed in HPI   Physical Exam Triage Vital Signs ED Triage Vitals  Encounter Vitals Group     BP 07/26/23 1416 (!) 160/106     Systolic BP Percentile --      Diastolic BP Percentile --      Pulse Rate 07/26/23 1416 89     Resp 07/26/23 1416 16     Temp 07/26/23 1416 98.1 F (36.7 C)     Temp Source 07/26/23  1416 Oral     SpO2 07/26/23 1416 95 %     Weight 07/26/23 1416 200 lb (90.7 kg)     Height 07/26/23 1416 5\' 6"  (1.676 m)     Head Circumference --      Peak Flow --      Pain Score 07/26/23 1415 10     Pain Loc --      Pain Education --      Exclude from Growth Chart --    No data found.  Updated Vital Signs BP (!) 160/106 (BP Location: Left Arm)   Pulse 89   Temp 98.1  F (36.7 C) (Oral)   Resp 16   Ht 5\' 6"  (1.676 m)   Wt 200 lb (90.7 kg)   SpO2 95%   BMI 32.28 kg/m   Visual Acuity Right Eye Distance:   Left Eye Distance:   Bilateral Distance:    Right Eye Near:   Left Eye Near:    Bilateral Near:     Physical Exam Vitals reviewed.  Constitutional:      Appearance: Normal appearance.  HENT:     Head: Normocephalic and atraumatic.     Nose: Nose normal.  Eyes:     Extraocular Movements: Extraocular movements intact.     Pupils: Pupils are equal, round, and reactive to light.  Cardiovascular:     Rate and Rhythm: Normal rate and regular rhythm.     Heart sounds: Murmur heard.  Pulmonary:     Effort: Pulmonary effort is normal.     Breath sounds: Normal breath sounds.  Chest:    Abdominal:     General: There is no distension.     Tenderness: There is no abdominal tenderness. There is no right CVA tenderness, left CVA tenderness or guarding.  Musculoskeletal:     Cervical back: Normal range of motion and neck supple.     Right lower leg: No edema.     Left lower leg: No edema.  Lymphadenopathy:     Cervical: No cervical adenopathy.  Skin:    Capillary Refill: Capillary refill takes less than 2 seconds.  Neurological:     General: No focal deficit present.     Mental Status: He is alert.      UC Treatments / Results  Labs (all labs ordered are listed, but only abnormal results are displayed) Labs Reviewed - No data to display  EKG Normal sinus rhythm with a right bundle branch block and left anterior bifascicular block rate 79  Radiology No  results found.  Procedures Procedures (including critical care time)  Medications Ordered in UC Medications - No data to display  Initial Impression / Assessment and Plan / UC Course  I have reviewed the triage vital signs and the nursing notes.  Pertinent labs & imaging results that were available during my care of the patient were reviewed by me and considered in my medical decision making (see chart for details).   Atypical chest pain ongoing x 4 days after being evaluated in the ED with negative troponins.  Patient has risk factors present for heart disease such as a history of opiate use disorder, type 2 diabetes, hypertension uncontrolled therefore will have patient follow-up with cardiology for further workup and evaluation of chest pain.  Given blood pressure is elevated today I am going to start patient on carvedilol 6.25 twice daily to improve workload and for hypertension management.  Cardiology referral placed.  Also advised patient to discontinue drinking the Maalox as I am concerned that this could possibly increase his blood sugar due to poorly controlled diabetes.  Will trial famotidine as needed twice daily and prescribe Zofran for nausea.  Despite recent negative workup at the ED patient is encouraged that if his chest pain does not improve or if it worsens to go to the ED for further workup and evaluation.  He is advised to keep follow-up with cardiology. Final Clinical Impressions(s) / UC Diagnoses   Final diagnoses:  Atypical chest pain  Dyspnea, unspecified type  Nausea and vomiting, unspecified vomiting type  Accelerated hypertension     Discharge Instructions  I have referred you to cardiology due to ongoing chest pain and poorly controlled blood pressure. Blood pressure uncontrolled increased the risk for heart disease and heart attack. Please follow-up with cardiology office once appointment is scheduled, and their office will call you directly to schedule the  appointment.  Your chest pain worsens or becomes severe prior to seeing the cardiologist go back to the emergency department for evaluation.  Carvedilol 6.25 twice daily this to treat your elevated blood pressure and increase how your heart pumps.   Schedule a follow-up appointment with your regular doctor as it should be time for you to have a follow-up appointment to check on your diabetes.      ED Prescriptions     Medication Sig Dispense Auth. Provider   carvedilol (COREG) 6.25 MG tablet Take 1 tablet (6.25 mg total) by mouth 2 (two) times daily with a meal. 60 tablet Bing Neighbors, NP   famotidine (PEPCID) 20 MG tablet Take 1 tablet (20 mg total) by mouth 2 (two) times daily. 30 tablet Bing Neighbors, NP   ondansetron (ZOFRAN) 4 MG tablet Take 1 tablet (4 mg total) by mouth every 8 (eight) hours as needed for nausea or vomiting. 20 tablet Bing Neighbors, NP      PDMP not reviewed this encounter.   Bing Neighbors, NP 07/26/23 1531

## 2023-07-26 NOTE — Discharge Instructions (Addendum)
I have referred you to cardiology due to ongoing chest pain and poorly controlled blood pressure. Blood pressure uncontrolled increased the risk for heart disease and heart attack. Please follow-up with cardiology office once appointment is scheduled, and their office will call you directly to schedule the appointment.  Your chest pain worsens or becomes severe prior to seeing the cardiologist go back to the emergency department for evaluation.  Carvedilol 6.25 twice daily this to treat your elevated blood pressure and increase how your heart pumps.   Schedule a follow-up appointment with your regular doctor as it should be time for you to have a follow-up appointment to check on your diabetes.

## 2023-07-29 NOTE — Progress Notes (Deleted)
Cardiology Office Note:    Date:  07/29/2023   ID:  [name], DOB Jan 10, 1991, MRN [id number]  PCP:  Olegario Messier, MD   [facility] Providers Cardiologist:  None { Click to update primary MD,subspecialty MD or APP then REFRESH:1}    Referring MD: Olegario Messier, MD   No chief complaint on file. ***  History of Present Illness:    [name] is a 32 y.o. male with a hx of asthma, HTN, DM2, referral for difficult to control HTN. He was in the ED 9/26 with GERD; chronic CP; frequent ED visits. His BP was 160/106 mmHg. TTE 08/24/2017 EF was normal.  Past Medical History:  Diagnosis Date   Asthma    HTN (hypertension) 07/25/2017   Lisinopril 40mg  Daily   Hyperglycemia 09/24/2017   Type II diabetes mellitus (HCC)    "dx'd 06/2017"    Past Surgical History:  Procedure Laterality Date   ADENOIDECTOMY     INCISION AND DRAINAGE ABSCESS Right 01/24/2022   Procedure: INCISION AND DRAINAGE ABSCESS;  Surgeon: Netta Cedars, MD;  Location: [facility];  Service: Orthopedics;  Laterality: Right;   IRRIGATION AND DEBRIDEMENT ABSCESS N/A 05/23/2023   Procedure: IRRIGATION AND DEBRIDEMENT ABSCESS, PENILE ABSCESS;  Surgeon: Loletta Parish., MD;  Location: [facility];  Service: Urology;  Laterality: N/A;   LACERATION REPAIR Left    "stitched finger up"   TONSILLECTOMY      Current Medications: No outpatient medications have been marked as taking for the 07/30/23 encounter (Appointment) with Maisie Fus, MD.     Allergies:   Vicodin [hydrocodone-acetaminophen], Egg-derived products, and Lactose intolerance (gi)   Social History   Socioeconomic History   Marital status: Single    Spouse name: Not on file   Number of children: Not on file   Years of education: Not on file   Highest education level: Not on file  Occupational History   Not on file  Tobacco Use   Smoking status: Some Days    Current packs/day: 0.50    Average packs/day: 0.5 packs/day  for 12.0 years (6.0 ttl pk-yrs)    Types: Cigarettes   Smokeless tobacco: Never   Tobacco comments:    0.5 PPD  Vaping Use   Vaping status: Never Used  Substance and Sexual Activity   Alcohol use: Not Currently    Comment: OCCASSIONAL   Drug use: Not Currently    Types: Marijuana    Comment: 10/17/2017 ~3 times per week   Sexual activity: Never  Other Topics Concern   Not on file  Social History Narrative   Not on file   Social Determinants of Health   Financial Resource Strain: Not on file  Food Insecurity: No Food Insecurity (05/22/2023)   Hunger Vital Sign    Worried About Running Out of Food in the Last Year: Never true    Ran Out of Food in the Last Year: Never true  Transportation Needs: No Transportation Needs (05/22/2023)   PRAPARE - Administrator, Civil Service (Medical): No    Lack of Transportation (Non-Medical): No  Physical Activity: Not on file  Stress: Not on file  Social Connections: Unknown (03/01/2022)   Received from [facility]   Social Network    Social Network: Not on file     Family History: The patient's ***family history is not on file.  ROS:   Please see the history of present illness.    *** All other  systems reviewed and are negative.  EKGs/Labs/Other Studies Reviewed:    The following studies were reviewed today: ***      Recent Labs: 07/22/2023: ALT 14; BUN 10; Creatinine, Ser 0.56; Hemoglobin 14.9; Platelets 258; Potassium 3.9; Sodium 139  Recent Lipid Panel    Component Value Date/Time   CHOL 108 01/26/2022 0327   TRIG 70 01/26/2022 0327   HDL 28 (L) 01/26/2022 0327   CHOLHDL 3.9 01/26/2022 0327   VLDL 14 01/26/2022 0327   LDLCALC 66 01/26/2022 0327     Risk Assessment/Calculations:   {Does this patient have ATRIAL FIBRILLATION?[phone]}  No BP recorded.  {Refresh Note OR Click here to enter BP  :1}***         Physical Exam:    VS:  There were no vitals taken for this visit.    Wt  Readings from Last 3 Encounters:  07/26/23 200 lb (90.7 kg)  07/22/23 198 lb 6.6 oz (90 kg)  05/22/23 200 lb (90.7 kg)     GEN: *** Well nourished, well developed in no acute distress HEENT: Normal NECK: No JVD; No carotid bruits LYMPHATICS: No lymphadenopathy CARDIAC: ***RRR, no murmurs, rubs, gallops RESPIRATORY:  Clear to auscultation without rales, wheezing or rhonchi  ABDOMEN: Soft, non-tender, non-distended MUSCULOSKELETAL:  No edema; No deformity  SKIN: Warm and dry NEUROLOGIC:  Alert and oriented x 3 PSYCHIATRIC:  Normal affect   ASSESSMENT:    HTN PLAN:    In order of problems listed above:  ***      {Are you ordering a CV Procedure (e.g. stress test, cath, DCCV, TEE, etc)?   Press F2        :[id number]}    Medication Adjustments/Labs and Tests Ordered: Current medicines are reviewed at length with the patient today.  Concerns regarding medicines are outlined above.  No orders of the defined types were placed in this encounter.  No orders of the defined types were placed in this encounter.   There are no Patient Instructions on file for this visit.   Signed, Maisie Fus, MD  07/29/2023 5:08 PM    [facility]

## 2023-07-30 ENCOUNTER — Ambulatory Visit: Payer: Managed Care, Other (non HMO) | Attending: Internal Medicine | Admitting: Internal Medicine

## 2023-08-12 ENCOUNTER — Other Ambulatory Visit: Payer: Self-pay | Admitting: Student

## 2023-08-12 DIAGNOSIS — F1111 Opioid abuse, in remission: Secondary | ICD-10-CM

## 2023-08-13 ENCOUNTER — Other Ambulatory Visit (HOSPITAL_COMMUNITY): Payer: Self-pay

## 2023-08-13 MED ORDER — BUPRENORPHINE HCL-NALOXONE HCL 8-2 MG SL SUBL
1.0000 | SUBLINGUAL_TABLET | Freq: Three times a day (TID) | SUBLINGUAL | 0 refills | Status: DC
Start: 2023-08-13 — End: 2023-09-06
  Filled 2023-08-13: qty 90, 30d supply, fill #0

## 2023-08-14 ENCOUNTER — Other Ambulatory Visit (HOSPITAL_COMMUNITY): Payer: Self-pay

## 2023-09-06 ENCOUNTER — Other Ambulatory Visit: Payer: Self-pay | Admitting: Student

## 2023-09-06 DIAGNOSIS — F1111 Opioid abuse, in remission: Secondary | ICD-10-CM

## 2023-09-06 NOTE — Telephone Encounter (Signed)
Last rx written 08/13/23. Last OV 04/16/23. TOX 04/16/23.

## 2023-09-10 ENCOUNTER — Other Ambulatory Visit (HOSPITAL_COMMUNITY): Payer: Self-pay

## 2023-09-10 MED ORDER — BUPRENORPHINE HCL-NALOXONE HCL 8-2 MG SL SUBL
1.0000 | SUBLINGUAL_TABLET | Freq: Three times a day (TID) | SUBLINGUAL | 0 refills | Status: DC
Start: 2023-09-10 — End: 2023-10-09
  Filled 2023-09-10: qty 90, 30d supply, fill #0

## 2023-10-09 ENCOUNTER — Other Ambulatory Visit: Payer: Self-pay | Admitting: Student

## 2023-10-09 DIAGNOSIS — F1111 Opioid abuse, in remission: Secondary | ICD-10-CM

## 2023-10-09 MED ORDER — BUPRENORPHINE HCL-NALOXONE HCL 8-2 MG SL SUBL
1.0000 | SUBLINGUAL_TABLET | Freq: Three times a day (TID) | SUBLINGUAL | 0 refills | Status: DC
Start: 2023-10-09 — End: 2023-11-08
  Filled 2023-10-09: qty 90, 30d supply, fill #0

## 2023-10-10 ENCOUNTER — Other Ambulatory Visit (HOSPITAL_COMMUNITY): Payer: Self-pay

## 2023-10-19 ENCOUNTER — Encounter: Payer: Managed Care, Other (non HMO) | Admitting: Internal Medicine

## 2023-10-19 NOTE — Progress Notes (Deleted)
OUD OP regimen is suboxone 8-2 mg TID. Last ToxAssure 03/2023 with unexpected results suggesting cocaine use and duloxetine. PDMP reviewed and appropriate. Denies symptoms of withdrawal or relapses. Plan:  T2DM HbA1c last checked ***, today ***. OP regimen is basaglar 28 units daily. He does*** check her blood sugar at home. Urine microalbumin/creatinine ratio last checked ***. Plan: Emphasized importance of annual diabetic eye exam.   HTN BP ***. OP regimen is carvedilol 6.25 mg BID which he is*** compliant with. BMP last checked ***. Plan:  HCM FLU EYE

## 2023-11-08 ENCOUNTER — Other Ambulatory Visit: Payer: Self-pay | Admitting: Student

## 2023-11-08 DIAGNOSIS — F1111 Opioid abuse, in remission: Secondary | ICD-10-CM

## 2023-11-08 NOTE — Telephone Encounter (Signed)
 Last rx written 12/09/22. Last OV 04/16/23. No f/u appt. TOX 04/16/23.

## 2023-11-12 ENCOUNTER — Other Ambulatory Visit (HOSPITAL_COMMUNITY): Payer: Self-pay

## 2023-11-12 MED ORDER — BUPRENORPHINE HCL-NALOXONE HCL 8-2 MG SL SUBL
1.0000 | SUBLINGUAL_TABLET | Freq: Three times a day (TID) | SUBLINGUAL | 0 refills | Status: DC
Start: 2023-11-12 — End: 2023-12-09
  Filled 2023-11-12: qty 90, 30d supply, fill #0

## 2023-11-21 ENCOUNTER — Other Ambulatory Visit (HOSPITAL_COMMUNITY): Payer: Self-pay

## 2023-12-05 ENCOUNTER — Ambulatory Visit (HOSPITAL_COMMUNITY)
Admission: EM | Admit: 2023-12-05 | Discharge: 2023-12-05 | Disposition: A | Discharge status: Home / Self Care | Payer: Commercial Managed Care - HMO | Attending: Emergency Medicine | Admitting: Emergency Medicine

## 2023-12-05 ENCOUNTER — Encounter (HOSPITAL_COMMUNITY): Payer: Self-pay

## 2023-12-05 DIAGNOSIS — T304 Corrosion of unspecified body region, unspecified degree: Secondary | ICD-10-CM | POA: Diagnosis not present

## 2023-12-05 MED ORDER — SILVER SULFADIAZINE 1 % EX CREA
TOPICAL_CREAM | CUTANEOUS | Status: AC
Start: 2023-12-05 — End: ?
  Filled 2023-12-05: qty 85

## 2023-12-05 MED ORDER — SILVER SULFADIAZINE 1 % EX CREA
TOPICAL_CREAM | Freq: Every day | CUTANEOUS | Status: DC
Start: 2023-12-05 — End: 2023-12-05

## 2023-12-05 NOTE — ED Provider Notes (Signed)
 [facility]    CSN: [id number] Arrival date & time: 12/05/23  1505      History   Chief Complaint Chief Complaint  Patient presents with   Burn    HPI [name] is a 33 y.o. male.   Patient presents with chemical burn from a chemical called caustic to his left wrist.  Denies exposure to chemical anywhere else on his body.  Denies inhalation or ingestion of chemical.  Patient endorses numbness and tingling to the area of the chemical burn.   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   Smoking 08/01/2017    Past Surgical History:  Procedure Laterality Date   ADENOIDECTOMY     INCISION AND DRAINAGE ABSCESS Right 01/24/2022   Procedure: INCISION AND DRAINAGE ABSCESS;  Surgeon: Barton Drape, MD;  Location: [facility];  Service: Orthopedics;  Laterality: Right;   IRRIGATION AND DEBRIDEMENT ABSCESS N/A 05/23/2023   Procedure: IRRIGATION AND DEBRIDEMENT ABSCESS, PENILE ABSCESS;  Surgeon: Alvaro Ricardo KATHEE Mickey., MD;  Location: [facility];  Service: Urology;  Laterality: N/A;   LACERATION REPAIR Left    stitched finger up   TONSILLECTOMY         Home Medications    Prior to Admission medications   Medication Sig Start Date End Date Taking? Authorizing Provider  B-D ULTRA-FINE 33 LANCETS MISC Use one lancet once daily. 06/08/22  Yes   blood glucose meter kit and supplies KIT Use up to four times daily as directed. 05/17/22  Yes Nguyen, Quan, DO  Blood  Glucose Monitoring Suppl (ONETOUCH VERIO) w/Device KIT Use once daily. 06/08/22  Yes   Blood Glucose Monitoring Suppl (TRUE METRIX METER) w/Device KIT USE AS DIRECTED 06/22/21  Yes Trudy Mliss Dragon, MD  buprenorphine -naloxone  (SUBOXONE ) 8-2 mg SUBL SL tablet Place 1 tablet under the tongue 3 (three) times daily. 11/12/23  Yes Nooruddin, Saad, MD  Continuous Blood Gluc Sensor (DEXCOM G7 SENSOR) MISC Apply 1 sensor every 10 days 03/16/22  Yes Nguyen, Quan, DO  famotidine  (PEPCID ) 20 MG tablet Take 1 tablet (20 mg total) by mouth 2 (two) times daily. 07/26/23  Yes Arloa Suzen RAMAN, NP  glucose blood (FREESTYLE LITE) test strip use up to 4 times daily to check blood sugar 05/17/22  Yes   glucose blood ([employer] BLOOD GLUCOSE TEST) test strip Use to test once daily. 06/08/22  Yes   glucose blood test strip 1 each by Other route 4 (four) times daily. Use as instructed 03/16/22  Yes Nguyen, Quan, DO  Insulin  Glargine (BASAGLAR  KWIKPEN) 100 UNIT/ML Inject 28 Units into the skin daily. 06/08/22  Yes   Insulin  Pen Needle (PEN NEEDLES 31GX5/16) 31G X 8 MM MISC Use 1 (one) Syringe as directed 06/08/22  Yes   Insulin  Pen Needle (UNIFINE PENTIPS) 32G X 4 MM MISC Use 4 times daily 03/16/22  Yes Nguyen, Quan, DO  Lancets (FREESTYLE) lancets use up to 4 times daily to check blood  sugar 05/17/22  Yes   ondansetron  (ZOFRAN ) 4 MG tablet Take 1 tablet (4 mg total) by mouth every 8 (eight) hours as needed for nausea or vomiting. 07/26/23  Yes Arloa Suzen RAMAN, NP  carvedilol  (COREG ) 6.25 MG tablet Take 1 tablet (6.25 mg total) by mouth 2 (two) times daily with a meal. 07/26/23 08/25/23  Arloa Suzen RAMAN, NP  Insulin  Glargine (BASAGLAR  KWIKPEN) 100 UNIT/ML Inject 28 Units into the skin daily. 05/17/22 10/25/22  Nguyen, Quan, DO    Family History History reviewed. No pertinent family history.  Social History Social History   Tobacco Use   Smoking status: Some Days    Current packs/day: 0.50    Average packs/day: 0.5  packs/day for 12.0 years (6.0 ttl pk-yrs)    Types: Cigarettes   Smokeless tobacco: Never   Tobacco comments:    0.5 PPD  Vaping Use   Vaping status: Never Used  Substance Use Topics   Alcohol use: Not Currently    Comment: OCCASSIONAL   Drug use: Not Currently    Types: Marijuana    Comment: 10/17/2017 ~3 times per week     Allergies   Vicodin [hydrocodone-acetaminophen ], Egg-derived products, and Lactose intolerance (gi)   Review of Systems Review of Systems  Skin:  Positive for color change.     Physical Exam Triage Vital Signs ED Triage Vitals  Encounter Vitals Group     BP 12/05/23 1606 130/87     Systolic BP Percentile --      Diastolic BP Percentile --      Pulse Rate 12/05/23 1606 87     Resp 12/05/23 1606 16     Temp 12/05/23 1608 98.7 F (37.1 C)     Temp Source 12/05/23 1608 Oral     SpO2 12/05/23 1606 98 %     Weight --      Height --      Head Circumference --      Peak Flow --      Pain Score --      Pain Loc --      Pain Education --      Exclude from Growth Chart --    No data found.  Updated Vital Signs BP 130/87 (BP Location: Left Arm)   Pulse 87   Temp 98.7 F (37.1 C) (Oral)   Resp 16   SpO2 98%   Visual Acuity Right Eye Distance:   Left Eye Distance:   Bilateral Distance:    Right Eye Near:   Left Eye Near:    Bilateral Near:     Physical Exam Vitals and nursing note reviewed.  Constitutional:      General: He is awake. He is not in acute distress.    Appearance: Normal appearance. He is well-developed and well-groomed. He is not ill-appearing.  Skin:    Findings: Burn present.     Comments: Superficial chemical burn noted to posterior aspect of left wrist/forearm.  Neurological:     Mental Status: He is alert.  Psychiatric:        Behavior: Behavior is cooperative.      UC Treatments / Results  Labs (all labs ordered are listed, but only abnormal results are displayed) Labs Reviewed - No data to  display  EKG   Radiology No results found.  Procedures Procedures (including critical care time)  Medications Ordered in UC Medications  silver  sulfADIAZINE  (SILVADENE ) 1 % cream ( Topical Given 12/05/23 1623)    Initial Impression /  Assessment and Plan / UC Course  I have reviewed the triage vital signs and the nursing notes.  Pertinent labs & imaging results that were available during my care of the patient were reviewed by me and considered in my medical decision making (see chart for details).     Patient presented with chemical burn from a chemical caustic to his left wrist.  Denies exposure to chemical anywhere else on his body.  Denies inhalation or ingestion of chemical.  Upon assessment superficial chemical burn noted to posterior aspect of left wrist/forearm.  Ordered Silvadene  to apply to the burn.  Had clinical staff send Silvadene  at home with patient for him to apply over the next few days.  Discussed strict return precautions. Final Clinical Impressions(s) / UC Diagnoses   Final diagnoses:  Chemical burn     Discharge Instructions      Apply Silvadene  once or twice daily.  Keep the area clean, dry, and covered over the next few days.  Return here if symptoms persists or worsens.    ED Prescriptions   None    PDMP not reviewed this encounter.   Johnie Flaming A, NP 12/05/23 [phone]

## 2023-12-05 NOTE — ED Triage Notes (Signed)
 Patient reports he was using a type of chemical today at work, when it splash on his left arm. Patient reports some numbness and tingling in his arm.

## 2023-12-05 NOTE — Discharge Instructions (Addendum)
Apply Silvadene once or twice daily.  Keep the area clean, dry, and covered over the next few days.  Return here if symptoms persists or worsens.

## 2023-12-09 ENCOUNTER — Other Ambulatory Visit: Payer: Self-pay | Admitting: Student

## 2023-12-09 DIAGNOSIS — F1111 Opioid abuse, in remission: Secondary | ICD-10-CM

## 2023-12-11 ENCOUNTER — Other Ambulatory Visit (HOSPITAL_COMMUNITY): Payer: Self-pay

## 2023-12-11 MED ORDER — BUPRENORPHINE HCL-NALOXONE HCL 8-2 MG SL SUBL
1.0000 | SUBLINGUAL_TABLET | Freq: Three times a day (TID) | SUBLINGUAL | 0 refills | Status: DC
  Filled 2023-12-11 – 2023-12-12 (×2): qty 90, 30d supply, fill #0

## 2023-12-12 ENCOUNTER — Other Ambulatory Visit (HOSPITAL_COMMUNITY): Payer: Self-pay

## 2023-12-13 ENCOUNTER — Other Ambulatory Visit (HOSPITAL_COMMUNITY): Payer: Self-pay

## 2024-01-08 ENCOUNTER — Other Ambulatory Visit: Payer: Self-pay | Admitting: Student

## 2024-01-08 DIAGNOSIS — F1111 Opioid abuse, in remission: Secondary | ICD-10-CM

## 2024-01-09 ENCOUNTER — Other Ambulatory Visit (HOSPITAL_COMMUNITY): Payer: Self-pay

## 2024-01-09 MED ORDER — BUPRENORPHINE HCL-NALOXONE HCL 8-2 MG SL SUBL
1.0000 | SUBLINGUAL_TABLET | Freq: Three times a day (TID) | SUBLINGUAL | 0 refills | Status: DC
  Filled 2024-01-09 – 2024-01-10 (×2): qty 90, 30d supply, fill #0

## 2024-01-09 NOTE — Telephone Encounter (Signed)
 Patient is scheduled to see you on 3/17.

## 2024-01-10 ENCOUNTER — Other Ambulatory Visit (HOSPITAL_COMMUNITY): Payer: Self-pay

## 2024-01-14 ENCOUNTER — Encounter: Admitting: Student

## 2024-01-16 ENCOUNTER — Encounter: Admitting: Student

## 2024-01-17 ENCOUNTER — Ambulatory Visit: Admitting: Student

## 2024-01-17 ENCOUNTER — Other Ambulatory Visit (HOSPITAL_COMMUNITY): Payer: Self-pay

## 2024-01-17 VITALS — BP 134/80 | HR 117 | Temp 98.4°F | Ht 66.0 in | Wt 198.5 lb

## 2024-01-17 DIAGNOSIS — Z7985 Long-term (current) use of injectable non-insulin antidiabetic drugs: Secondary | ICD-10-CM

## 2024-01-17 DIAGNOSIS — Z794 Long term (current) use of insulin: Secondary | ICD-10-CM

## 2024-01-17 DIAGNOSIS — T304 Corrosion of unspecified body region, unspecified degree: Secondary | ICD-10-CM

## 2024-01-17 DIAGNOSIS — E119 Type 2 diabetes mellitus without complications: Secondary | ICD-10-CM | POA: Diagnosis not present

## 2024-01-17 DIAGNOSIS — R21 Rash and other nonspecific skin eruption: Secondary | ICD-10-CM | POA: Diagnosis not present

## 2024-01-17 DIAGNOSIS — E1110 Type 2 diabetes mellitus with ketoacidosis without coma: Secondary | ICD-10-CM

## 2024-01-17 DIAGNOSIS — L84 Corns and callosities: Secondary | ICD-10-CM

## 2024-01-17 DIAGNOSIS — F1190 Opioid use, unspecified, uncomplicated: Secondary | ICD-10-CM

## 2024-01-17 MED ORDER — CLINDAMYCIN PHOSPHATE 1 % EX GEL
Freq: Two times a day (BID) | CUTANEOUS | 0 refills | Status: AC
  Filled 2024-01-17: qty 30, 30d supply, fill #0

## 2024-01-17 MED ORDER — BASAGLAR KWIKPEN 100 UNIT/ML ~~LOC~~ SOPN
28.0000 [IU] | PEN_INJECTOR | Freq: Every day | SUBCUTANEOUS | 2 refills | Status: DC
  Filled 2024-01-17: qty 9, 32d supply, fill #0

## 2024-01-17 MED ORDER — DEXCOM G7 SENSOR MISC
5 refills | Status: DC
  Filled 2024-01-17: qty 1, 10d supply, fill #0

## 2024-01-17 MED ORDER — BASAGLAR KWIKPEN 100 UNIT/ML ~~LOC~~ SOPN
20.0000 [IU] | PEN_INJECTOR | Freq: Every day | SUBCUTANEOUS | 2 refills | Status: DC
  Filled 2024-01-17 (×2): qty 15, 75d supply, fill #0

## 2024-01-17 NOTE — Assessment & Plan Note (Addendum)
 Patient's prescribed current regimen is Basaglar 28 units, and Ozempic 0.25 mg weekly.  He borrowed his friend's Dexcom, and he says that his sugars can range from 200-350. His previous A1c was 11.0, however today we were unable to get a fingerstick A1c as he is a difficult stick.  He is currently only taking Basaglar 10 units at night, he has not been taking the Ozempic.  Given that he states his glucose ranges are still elevated, will increase to 20 as he was supposed to be on 28 previously.  Prescribe Dexcom for him as well.  Plan: - Basaglar 20 units at night - Dexcom prescribed

## 2024-01-17 NOTE — Assessment & Plan Note (Addendum)
 Patient states that for the last few days he has been having pain when he sits. He denies any drainage from the area.  He denies any change in his bowel habits like constipation or diarrhea.  He is not sexually active.  This is the first time he has had these.  Please see photo in physical exam tab.  On my exam, there are multiple raised small seem to be pimples on the right gluteus fold.  There is a hardened area underneath these as well extending laterally.  Overall, could be early hidradenitis suppurativa, will treat with clindamycin cream.

## 2024-01-17 NOTE — Assessment & Plan Note (Signed)
 Patient states that he used to work at a [employer], and burned himself with sodium hydroxide.  He does have a well-healing burn on his left wrist, however he recently realized that he has to burns located on his left leg posteriorly.  They seem to be healed, however they are open.  Please see photo in physical exam section.  He does have good pulses in his feet bilaterally.  Provided him instructions on how to demise the risk for infection such as wearing pants to make sure the area is clean.

## 2024-01-17 NOTE — Patient Instructions (Addendum)
 Thank you so much for coming to the clinic today!   I have sent in some antibiotic gel for your rash on the back. The phone number for the foot doctor is [phone], and ask to see Dr. Annamary Rummage.   For your diabetes, we are checking your A1c today, and also I have sent in the meter. Please start taking 20 units of the lantus  If you have any questions please feel free to the call the clinic at anytime at [phone]. It was a pleasure seeing you!  Best, Dr. Thomasene Ripple

## 2024-01-17 NOTE — Progress Notes (Signed)
 CC: Opioid use disorder and diabetes follow-up  HPI:  [name] is a 33 y.o. male living with a history stated below and presents today for opioid use disorder and diabetes follow-up. Please see problem based assessment and plan for additional details.  Past Medical History:  Diagnosis Date   Asthma    HTN (hypertension) 07/25/2017   Lisinopril 40mg  Daily   Hyperglycemia 09/24/2017   Type II diabetes mellitus (HCC)    "dx'd 06/2017"    Current Outpatient Medications on File Prior to Visit  Medication Sig Dispense Refill   B-D ULTRA-FINE 33 LANCETS MISC Use one lancet once daily. 100 each 3   blood glucose meter kit and supplies KIT Use up to four times daily as directed. 1 each 0   Blood Glucose Monitoring Suppl (ONETOUCH VERIO) w/Device KIT Use once daily. 1 kit 2   Blood Glucose Monitoring Suppl (TRUE METRIX METER) w/Device KIT USE AS DIRECTED 1 kit 0   buprenorphine-naloxone (SUBOXONE) 8-2 mg SUBL SL tablet Place 1 tablet under the tongue 3 (three) times daily. 90 tablet 0   carvedilol (COREG) 6.25 MG tablet Take 1 tablet (6.25 mg total) by mouth 2 (two) times daily with a meal. 60 tablet 0   famotidine (PEPCID) 20 MG tablet Take 1 tablet (20 mg total) by mouth 2 (two) times daily. 30 tablet 0   glucose blood (FREESTYLE LITE) test strip use up to 4 times daily to check blood sugar 100 each 0   glucose blood ([employer] BLOOD GLUCOSE TEST) test strip Use to test once daily. 30 strip 12   glucose blood test strip 1 each by Other route 4 (four) times daily. Use as instructed 100 each 12   Insulin Pen Needle (PEN NEEDLES 31GX5/16") 31G X 8 MM MISC Use 1 (one) Syringe as directed 100 each 3   Insulin Pen Needle (UNIFINE PENTIPS) 32G X 4 MM MISC Use 4 times daily 100 each 5   Lancets (FREESTYLE) lancets use up to 4 times daily to check blood sugar 100 each 0   ondansetron (ZOFRAN) 4 MG tablet Take 1 tablet (4 mg total) by mouth every 8 (eight) hours as needed for nausea or  vomiting. 20 tablet 0   No current facility-administered medications on file prior to visit.    No family history on file.  Social History   Socioeconomic History   Marital status: Single    Spouse name: Not on file   Number of children: Not on file   Years of education: Not on file   Highest education level: Not on file  Occupational History   Not on file  Tobacco Use   Smoking status: Some Days    Current packs/day: 0.50    Average packs/day: 0.5 packs/day for 12.0 years (6.0 ttl pk-yrs)    Types: Cigarettes   Smokeless tobacco: Never   Tobacco comments:    0.5 PPD  Vaping Use   Vaping status: Never Used  Substance and Sexual Activity   Alcohol use: Not Currently    Comment: OCCASSIONAL   Drug use: Not Currently    Types: Marijuana    Comment: 10/17/2017 ~3 times per week   Sexual activity: Never  Other Topics Concern   Not on file  Social History Narrative   Not on file   Social Drivers of Health   Financial Resource Strain: Not on file  Food Insecurity: No Food Insecurity (05/22/2023)   Hunger Vital Sign    Worried About Running  Out of Food in the Last Year: Never true    Ran Out of Food in the Last Year: Never true  Transportation Needs: No Transportation Needs (05/22/2023)   PRAPARE - Administrator, Civil Service (Medical): No    Lack of Transportation (Non-Medical): No  Physical Activity: Not on file  Stress: Not on file  Social Connections: Unknown (03/01/2022)   Received from [facility], [facility]   Social Network    Social Network: Not on file  Intimate Partner Violence: Not At Risk (05/22/2023)   Humiliation, Afraid, Rape, and Kick questionnaire    Fear of Current or Ex-Partner: No    Emotionally Abused: No    Physically Abused: No    Sexually Abused: No    Review of Systems: ROS negative except for what is noted on the assessment and plan.  Vitals:   01/17/24 1320  BP: 134/80  Pulse: (!) 117  Temp: 98.4 F (36.9 C)   TempSrc: Oral  SpO2: 99%  Weight: 198 lb 8 oz (90 kg)  Height: 5\' 6"  (1.676 m)    Physical Exam: Constitutional: well-appearing male  in no acute distress Cardiovascular: regular rate and rhythm, no m/r/g, in tact DP/PT pulses Pulmonary/Chest: normal work of breathing on room air, lungs clear to auscultation bilaterally Abdominal: soft, non-tender, non-distended MSK: normal bulk and tone Neurological: alert & oriented x 3, 5/5 strength in bilateral upper and lower extremities, normal gait Skin: warm and dry, multiple raised areas in right intergluteal fold. Two large areas reminiscient of chemical burns on left posterior calf, no surrounding erythema. Multiple callouses present on bilateral feet        Assessment & Plan:   Opioid use disorder Patient presents for follow-up regarding his opioid use disorder.  He is currently on Suboxone 3 tablets, will check tox assure today.  He denies any cravings or relapses.  Pending results of tox assure, can likely extend prescription with 2 refills.  Insulin dependent type 2 diabetes mellitus (HCC) Patient's prescribed current regimen is Basaglar 28 units, and Ozempic 0.25 mg weekly.  He borrowed his friend's Dexcom, and he says that his sugars can range from 200-350. His previous A1c was 11.0, however today we were unable to get a fingerstick A1c as he is a difficult stick.  He is currently only taking Basaglar 10 units at night, he has not been taking the Ozempic.  Given that he states his glucose ranges are still elevated, will increase to 20 as he was supposed to be on 28 previously.  Prescribe Dexcom for him as well.  Plan: - Basaglar 20 units at night - Dexcom prescribed  Chemical burn Patient states that he used to work at a [employer], and burned himself with sodium hydroxide.  He does have a well-healing burn on his left wrist, however he recently realized that he has to burns located on his left leg posteriorly.  They seem to  be healed, however they are open.  Please see photo in physical exam section.  He does have good pulses in his feet bilaterally.  Provided him instructions on how to demise the risk for infection such as wearing pants to make sure the area is clean.  Rash Patient states that for the last few days he has been having pain when he sits. He denies any drainage from the area.  He denies any change in his bowel habits like constipation or diarrhea.  He is not sexually active.  This is  the first time he has had these.  Please see photo in physical exam tab.  On my exam, there are multiple raised small seem to be pimples on the right gluteus fold.  There is a hardened area underneath these as well extending laterally.  Overall, could be early hidradenitis suppurativa, will treat with clindamycin cream.    Callus Patient with multiple calluses on on his feet bilaterally, has previously seen a podiatrist, provided him number with podiatrist office to schedule an appointment.     Patient discussed with Dr. Kae Heller [name], M.D. [facility] Internal Medicine, PGY-2 Pager: [phone] Date 01/17/2024 Time 3:22 PM

## 2024-01-17 NOTE — Assessment & Plan Note (Signed)
 Patient presents for follow-up regarding his opioid use disorder.  He is currently on Suboxone 3 tablets, will check tox assure today.  He denies any cravings or relapses.  Pending results of tox assure, can likely extend prescription with 2 refills.

## 2024-01-17 NOTE — Assessment & Plan Note (Signed)
 Patient with multiple calluses on on his feet bilaterally, has previously seen a podiatrist, provided him number with podiatrist office to schedule an appointment.

## 2024-01-18 LAB — HEMOGLOBIN A1C
Est. average glucose Bld gHb Est-mCnc: 249 mg/dL
Hgb A1c MFr Bld: 10.3 % — ABNORMAL HIGH (ref 4.8–5.6)

## 2024-01-21 LAB — TOXASSURE SELECT,+ANTIDEPR,UR

## 2024-01-22 ENCOUNTER — Encounter: Payer: Self-pay | Admitting: Student

## 2024-01-26 NOTE — Progress Notes (Signed)
 Internal Medicine Clinic Attending  Case discussed with the resident at the time of the visit.  We reviewed the resident's history and exam and pertinent patient test results.  I agree with the assessment, diagnosis, and plan of care documented in the resident's note.

## 2024-01-28 ENCOUNTER — Other Ambulatory Visit: Payer: Self-pay | Admitting: Student

## 2024-01-28 DIAGNOSIS — F1111 Opioid abuse, in remission: Secondary | ICD-10-CM

## 2024-01-29 ENCOUNTER — Other Ambulatory Visit (HOSPITAL_COMMUNITY): Payer: Self-pay

## 2024-01-31 ENCOUNTER — Other Ambulatory Visit: Payer: Self-pay | Admitting: Student

## 2024-01-31 DIAGNOSIS — F1111 Opioid abuse, in remission: Secondary | ICD-10-CM

## 2024-02-01 ENCOUNTER — Other Ambulatory Visit (HOSPITAL_COMMUNITY): Payer: Self-pay

## 2024-02-01 MED ORDER — BUPRENORPHINE HCL-NALOXONE HCL 8-2 MG SL SUBL
1.0000 | SUBLINGUAL_TABLET | Freq: Three times a day (TID) | SUBLINGUAL | 0 refills | Status: DC
  Filled 2024-02-11: qty 90, 30d supply, fill #0

## 2024-02-07 ENCOUNTER — Ambulatory Visit (HOSPITAL_COMMUNITY)
Admission: EM | Admit: 2024-02-07 | Discharge: 2024-02-07 | Disposition: A | Discharge status: Home / Self Care | Attending: Family Medicine | Admitting: Family Medicine

## 2024-02-07 ENCOUNTER — Encounter (HOSPITAL_COMMUNITY): Payer: Self-pay | Admitting: Emergency Medicine

## 2024-02-07 DIAGNOSIS — L84 Corns and callosities: Secondary | ICD-10-CM | POA: Diagnosis not present

## 2024-02-07 MED ORDER — DOXYCYCLINE HYCLATE 100 MG PO CAPS: 100.0000 mg | ORAL_CAPSULE | Freq: Two times a day (BID) | ORAL | 0 refills | Status: DC

## 2024-02-07 NOTE — ED Triage Notes (Signed)
 Reports had calluses on bottom of feet for years. Reports got pedicure couple days ago and now callus on right foot is causing pain. Hasn't taken anything for pain

## 2024-02-11 ENCOUNTER — Other Ambulatory Visit (HOSPITAL_COMMUNITY): Payer: Self-pay

## 2024-02-12 NOTE — ED Provider Notes (Signed)
 [facility]   [id number] 02/07/24 Arrival Time: 1859  ASSESSMENT & PLAN:  1. Callus of foot    Large calluses of foot; very painful. Really needs to see a podiatrist. I do not appreciate active infection but will have him begin: Meds ordered this encounter  Medications   doxycycline (VIBRAMYCIN) 100 MG capsule    Sig: Take 1 capsule (100 mg total) by mouth 2 (two) times daily.    Dispense:  20 capsule    Refill:  0   Orders Placed This Encounter  Procedures   Ambulatory referral to Podiatry    Referral Priority:   Routine    Referral Type:   Consultation    Referral Reason:   Specialty Services Required    Requested Specialty:   Podiatry    Number of Visits Requested:   1    Recommend:  Follow-up Information     Schedule an appointment as soon as possible for a visit  with [facility]).   Contact information: [address] [place],  [place]  [address]  [phone]                Reviewed expectations re: course of current medical issues. Questions answered. Outlined signs and symptoms indicating need for more acute intervention. Patient verbalized understanding. After Visit Summary given.  SUBJECTIVE: History from: patient. [name] is a 33 y.o. male who reports calluses on bottom of feet for years. R foot very painful.  Reports got pedicure couple days ago and now callus on right foot is causing pain. Hasn't taken anything for pain  Denies fever/drainage.    Past Surgical History:  Procedure Laterality Date   ADENOIDECTOMY     INCISION AND DRAINAGE ABSCESS Right 01/24/2022   Procedure: INCISION AND DRAINAGE ABSCESS;  Surgeon: Netta Cedars, MD;  Location: [facility];  Service: Orthopedics;  Laterality: Right;   IRRIGATION AND DEBRIDEMENT ABSCESS N/A 05/23/2023   Procedure: IRRIGATION AND DEBRIDEMENT ABSCESS, PENILE ABSCESS;  Surgeon: Loletta Parish., MD;  Location: [facility];  Service: Urology;   Laterality: N/A;   LACERATION REPAIR Left    "stitched finger up"   TONSILLECTOMY        OBJECTIVE:  Vitals:   02/07/24 1920  BP: 120/79  Pulse: (!) 103  Resp: 15  Temp: 97.9 F (36.6 C)  TempSrc: Oral  SpO2: 96%    General appearance: alert; no distress HEENT: [name]; AT Neck: supple with FROM Resp: unlabored respirations Extremities: RLE: large and thick calluses on planter foot; without areas of fluctuance or bleeding; TTP diffusely Psychological: alert and cooperative; normal mood and affect  Imaging: No results found.    Allergies  Allergen Reactions   Vicodin [Hydrocodone-Acetaminophen] Anaphylaxis    Tolerates tylenol with no allergy   Egg-Derived Products Nausea And Vomiting   Lactose Intolerance (Gi) Rash    Past Medical History:  Diagnosis Date   Asthma    HTN (hypertension) 07/25/2017   Lisinopril 40mg  Daily   Hyperglycemia 09/24/2017   Type II diabetes mellitus (HCC)    "dx'd 06/2017"   Social History   Socioeconomic History   Marital status: Single    Spouse name: Not on file   Number of children: Not on file   Years of education: Not on file   Highest education level: Not on file  Occupational History   Not on file  Tobacco Use   Smoking status: Some Days    Current packs/day: 0.50  Average packs/day: 0.5 packs/day for 12.0 years (6.0 ttl pk-yrs)    Types: Cigarettes   Smokeless tobacco: Never   Tobacco comments:    0.5 PPD  Vaping Use   Vaping status: Never Used  Substance and Sexual Activity   Alcohol use: Not Currently    Comment: OCCASSIONAL   Drug use: Not Currently    Types: Marijuana    Comment: 10/17/2017 ~3 times per week   Sexual activity: Never  Other Topics Concern   Not on file  Social History Narrative   Not on file   Social Drivers of Health   Financial Resource Strain: Not on file  Food Insecurity: No Food Insecurity (05/22/2023)   Hunger Vital Sign    Worried About Running Out of Food in the Last Year:  Never true    Ran Out of Food in the Last Year: Never true  Transportation Needs: No Transportation Needs (05/22/2023)   PRAPARE - Administrator, Civil Service (Medical): No    Lack of Transportation (Non-Medical): No  Physical Activity: Not on file  Stress: Not on file  Social Connections: Unknown (03/01/2022)   Received from [facility], [facility]   Social Network    Social Network: Not on file   No family history on file. Past Surgical History:  Procedure Laterality Date   ADENOIDECTOMY     INCISION AND DRAINAGE ABSCESS Right 01/24/2022   Procedure: INCISION AND DRAINAGE ABSCESS;  Surgeon: Ali Ink, MD;  Location: [facility];  Service: Orthopedics;  Laterality: Right;   IRRIGATION AND DEBRIDEMENT ABSCESS N/A 05/23/2023   Procedure: IRRIGATION AND DEBRIDEMENT ABSCESS, PENILE ABSCESS;  Surgeon: Melody Spurling., MD;  Location: [facility];  Service: Urology;  Laterality: N/A;   LACERATION REPAIR Left    "stitched finger up"   TONSILLECTOMY         Afton Albright, MD 02/12/24 1021

## 2024-02-13 ENCOUNTER — Other Ambulatory Visit (HOSPITAL_COMMUNITY): Payer: Self-pay

## 2024-02-13 ENCOUNTER — Encounter: Payer: Self-pay | Admitting: Podiatry

## 2024-02-13 ENCOUNTER — Ambulatory Visit (INDEPENDENT_AMBULATORY_CARE_PROVIDER_SITE_OTHER)

## 2024-02-13 ENCOUNTER — Ambulatory Visit (INDEPENDENT_AMBULATORY_CARE_PROVIDER_SITE_OTHER): Admitting: Podiatry

## 2024-02-13 DIAGNOSIS — L02611 Cutaneous abscess of right foot: Secondary | ICD-10-CM

## 2024-02-13 DIAGNOSIS — L97512 Non-pressure chronic ulcer of other part of right foot with fat layer exposed: Secondary | ICD-10-CM

## 2024-02-13 MED ORDER — DOXYCYCLINE HYCLATE 100 MG PO TABS
100.0000 mg | ORAL_TABLET | Freq: Two times a day (BID) | ORAL | 1 refills | Status: DC
Start: 2024-02-13 — End: 2024-03-17
  Filled 2024-02-13: qty 30, 15d supply, fill #0
  Filled 2024-03-12: qty 30, 15d supply, fill #1

## 2024-02-13 NOTE — Progress Notes (Signed)
 Subjective:   Patient ID: [name], male   DOB: 33 y.o.   MRN: [id number]   HPI Patient presents stating he has had quite a bit of pain in his right foot and was sent over by urgent care.  States that he was put on antibiotics which helped a little bit he does not have the same redness but still has pain and his sugar is not in good control last A1c 10.3   ROS      Objective:  Physical Exam  Neurovascular status found to be diminished as far as neurologically vascular status intact.  Severe keratotic lesion subfifth metatarsal head bilateral first metatarsal and bilateral with fluid around the fifth MPJ right localized no proximal edema or edema drainage noted.     Assessment:  Inflammatory condition with the probability for infection of the right fifth MPJ localized     Plan:  Sterile prep sterilized mentation debrided tissue right and I did note there was some localized drainage consistent with infective process appears to be staph.  I went ahead today and since his local I flushed the area out applied sterile dressing and offloaded it and instructed on wet-to-dry dressings at home.  Placed on doxycycline strict instructions if any erythema edema or drainage were to occur to reappoint immediately if not healed uneventfully and reviewed x-rays  X-rays were negative for signs of bony changes appears to be soft tissue processes

## 2024-02-14 ENCOUNTER — Ambulatory Visit: Admitting: Podiatry

## 2024-02-27 ENCOUNTER — Ambulatory Visit: Admitting: Podiatrist

## 2024-02-28 ENCOUNTER — Ambulatory Visit: Payer: Self-pay

## 2024-02-28 NOTE — Telephone Encounter (Signed)
  Chief Complaint: blurred vision that comes and goes for 1 month Symptoms: right eye worse than left Frequency: 1 month Pertinent Negatives: Patient denies headache, speech problems Disposition: [] ED /[] Urgent Care (no appt availability in office) / [x] Appointment(In office/virtual)/ []  [facility]/ [] Home Care/ [] Refused Recommended Disposition /[] [facility]/ []  Follow-up with PCP Additional Notes:  Copied from CRM [id number]. Topic: Clinical - Red Word Triage >> Feb 28, 2024 11:10 AM Tisa Forester wrote: Red Word that prompted transfer to Nurse Triage: blur eye vision Patient orginally call to schedule a followup for diabetes and to get eyes and feet checked due to red word blur vision reason transfer to nurse triage Reason for Disposition  [1] Diabetes mellitus AND [2] no eye exam in > 12 months  Answer Assessment - Initial Assessment Questions 1. DESCRIPTION: "How has your vision changed?" (e.g., complete vision loss, blurred vision, double vision, floaters, etc.)     Blurred vision 2. LOCATION: "One or both eyes?" If one, ask: "Which eye?"     Both R >L 3. SEVERITY: "Can you see anything?" If Yes, ask: "What can you see?" (e.g., fine print)     Can see but it comes and goes  4. ONSET: "When did this begin?" "Did it start suddenly or has this been gradual?"     1 month 5. PATTERN: "Does this come and go, or has it been constant since it started?"     Comes and goes 6. PAIN: "Is there any pain in your eye(s)?"  (Scale 1-10; or mild, moderate, severe)   - NONE (0): No pain.   - MILD (1-3): Doesn't interfere with normal activities.   - MODERATE (4-7): Interferes with normal activities or awakens from sleep.    - SEVERE (8-10): Excruciating pain, unable to do any normal activities.     no 7. CONTACTS-GLASSES: "Do you wear contacts or glasses?"     neither 8. CAUSE: "What do you think is causing this visual problem?"     diabetes 9. OTHER SYMPTOMS: "Do you have any  other symptoms?" (e.g., confusion, headache, arm or leg weakness, speech problems)     no  Protocols used: Vision Loss or Change-A-AH

## 2024-02-29 ENCOUNTER — Ambulatory Visit: Payer: Self-pay | Admitting: Internal Medicine

## 2024-02-29 NOTE — Progress Notes (Deleted)
 Patient complains of blurry vision of *** eyes that has been occurring for ***. This has*** never happened before. He first noticed it ***. He has not*** been evaluated by an eye doctor. He has not headache, dizziness, syncope.*** He wears eye glasses/cotnacts***. Please note he has uncontrolled diabetes, see addition assessment and plan regarding this, and hypertension.  Plan:  T2DM HbA1c last checked 12/2023 10.3%. OP regimen is basaglar  20 units daily*** and has not been taking prescribed ozempic . He has not began using Dexcom.*** Urine microalbumin/creatinine ratio last checked 03/2023 WNL.Last eye exam ***. Plan:  Emphasized importance of annual diabetic eye exam.

## 2024-03-05 ENCOUNTER — Ambulatory Visit: Admitting: Podiatrist

## 2024-03-12 ENCOUNTER — Other Ambulatory Visit (HOSPITAL_COMMUNITY): Payer: Self-pay

## 2024-03-17 ENCOUNTER — Ambulatory Visit (INDEPENDENT_AMBULATORY_CARE_PROVIDER_SITE_OTHER): Admitting: Student

## 2024-03-17 ENCOUNTER — Ambulatory Visit: Admitting: Podiatry

## 2024-03-17 ENCOUNTER — Other Ambulatory Visit (HOSPITAL_COMMUNITY): Payer: Self-pay

## 2024-03-17 VITALS — BP 155/96 | HR 98 | Temp 98.1°F | Ht 66.0 in | Wt 191.6 lb

## 2024-03-17 DIAGNOSIS — F1190 Opioid use, unspecified, uncomplicated: Secondary | ICD-10-CM | POA: Diagnosis not present

## 2024-03-17 DIAGNOSIS — Z794 Long term (current) use of insulin: Secondary | ICD-10-CM | POA: Diagnosis not present

## 2024-03-17 DIAGNOSIS — T304 Corrosion of unspecified body region, unspecified degree: Secondary | ICD-10-CM | POA: Diagnosis not present

## 2024-03-17 DIAGNOSIS — E119 Type 2 diabetes mellitus without complications: Secondary | ICD-10-CM

## 2024-03-17 DIAGNOSIS — F1111 Opioid abuse, in remission: Secondary | ICD-10-CM

## 2024-03-17 MED ORDER — BUPRENORPHINE HCL-NALOXONE HCL 8-2 MG SL SUBL
1.0000 | SUBLINGUAL_TABLET | Freq: Three times a day (TID) | SUBLINGUAL | 0 refills | Status: DC
Start: 2024-03-19 — End: 2024-04-30
  Filled 2024-03-19: qty 90, 30d supply, fill #0

## 2024-03-17 NOTE — Patient Instructions (Addendum)
 Thank you, [name] for allowing us  to provide your care today.   I have ordered the following medication/changed the following medications:   - Refill for suboxone  in two days  - I will call you with results for your urine.   -Follow up with Podiatry and Ophthalmology  - Consider getting you Pneumonia vaccine  -Follow up with [name] for Diabetes education  Follow up: 1 month for Diabetes follow up and for  Suboxone   Should you have any questions or concerns please call the internal medicine clinic at [phone].     Jose Ngo, MD [facility]
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
## 2024-03-17 NOTE — Assessment & Plan Note (Addendum)
 Curently on Suboxone  8-2mg  TID, has been taking this daily. Las refill on 02/11/2024 for 30 days. Still has six pills at home and would not need a refill until two more days. He states that he has been using any other drugs after his relapse last month. Denies any constipation. No cravings at this time.   -Cw Suboxone  8-2mg  TID (refill on 03/19/2024)  -Utox today

## 2024-03-17 NOTE — Assessment & Plan Note (Addendum)
 Lab Results  Component Value Date   HGBA1C 10.3 (H) 01/17/2024   HGBA1C 11.0 (A) 04/16/2023   HGBA1C 9.1 (A) 05/17/2022   Poorly controlled T2DM. A1c 2 months ago 10.3. He is currently using 20 units of basaglar . Was prescribed a dexcom but is not currently using one. Denies hypoglycemia or sxs of hypoglycemia. States glucose has not been higher than 220.  He has not seen donna, unsure if he can afford Dexcom. Will refer her to have an appointment with her as he would also benefit from nutrition counseling. Has several calluses over his bilateral feet. None of which look infected today. Saw podiatry a month ago, needs follow up with them and should book an appointment. High risk given calluses and onychomycosis. DP pulses weak but recent ABIs WNL  -FU in 1 month for A1C -Consider rechecking Lipid panel  -schedule follow-up with podiatry given calluses for maintenance  -daily foot exams  -cw 20 units of basaglar - referral to [facility] for dexcom -Encourage pt to take PCV-20 vaccine, declined at this visit
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
## 2024-03-18 ENCOUNTER — Other Ambulatory Visit (HOSPITAL_COMMUNITY): Payer: Self-pay

## 2024-03-19 ENCOUNTER — Other Ambulatory Visit (HOSPITAL_COMMUNITY): Payer: Self-pay

## 2024-03-20 ENCOUNTER — Other Ambulatory Visit (HOSPITAL_COMMUNITY): Payer: Self-pay

## 2024-03-21 LAB — TOXASSURE SELECT,+ANTIDEPR,UR

## 2024-03-25 NOTE — Progress Notes (Signed)
 Internal Medicine Clinic Attending  Case discussed with the resident at the time of the visit.  We reviewed the resident's history and exam and pertinent patient test results.  I agree with the assessment, diagnosis, and plan of care documented in the resident's note.

## 2024-04-11 ENCOUNTER — Ambulatory Visit: Admitting: Podiatry

## 2024-04-11 ENCOUNTER — Encounter: Payer: Self-pay | Admitting: Podiatry

## 2024-04-11 VITALS — Ht 66.0 in | Wt 191.0 lb

## 2024-04-11 DIAGNOSIS — L84 Corns and callosities: Secondary | ICD-10-CM

## 2024-04-11 DIAGNOSIS — E1142 Type 2 diabetes mellitus with diabetic polyneuropathy: Secondary | ICD-10-CM

## 2024-04-11 NOTE — Progress Notes (Signed)
 Subjective:   Patient ID: [name], male   DOB: 33 y.o.   MRN: [id number]   HPI Patient presents stating that he has the severe lesions and he is not getting infection but he is worried about it due to his poor diabetic control   ROS      Objective:  Physical Exam  Vascular status intact neurologically he does have significant disease with severe keratotic lesions subfifth metatarsal bilateral     Assessment:  Chronic lesion formation bilateral with diabetes is complicating factor     Plan:  Debridement of lesion aggressive this can be done on a as needed basis do not see any signs of breakdown of skin infection currently

## 2024-04-28 ENCOUNTER — Encounter: Admitting: Student

## 2024-04-28 ENCOUNTER — Encounter: Admitting: Dietician

## 2024-04-30 ENCOUNTER — Other Ambulatory Visit: Payer: Self-pay | Admitting: Student

## 2024-04-30 ENCOUNTER — Other Ambulatory Visit (HOSPITAL_COMMUNITY): Payer: Self-pay

## 2024-04-30 ENCOUNTER — Other Ambulatory Visit: Payer: Self-pay

## 2024-04-30 DIAGNOSIS — F1111 Opioid abuse, in remission: Secondary | ICD-10-CM

## 2024-04-30 MED ORDER — BUPRENORPHINE HCL-NALOXONE HCL 8-2 MG SL SUBL
1.0000 | SUBLINGUAL_TABLET | Freq: Three times a day (TID) | SUBLINGUAL | 0 refills | Status: DC
  Filled 2024-04-30 – 2024-05-14 (×3): qty 90, 30d supply, fill #0

## 2024-05-02 ENCOUNTER — Ambulatory Visit (HOSPITAL_COMMUNITY)
Admission: EM | Admit: 2024-05-02 | Discharge: 2024-05-02 | Disposition: A | Discharge status: Home / Self Care | Attending: Emergency Medicine | Admitting: Emergency Medicine

## 2024-05-02 ENCOUNTER — Encounter (HOSPITAL_COMMUNITY): Payer: Self-pay

## 2024-05-02 DIAGNOSIS — L089 Local infection of the skin and subcutaneous tissue, unspecified: Secondary | ICD-10-CM

## 2024-05-02 DIAGNOSIS — E1165 Type 2 diabetes mellitus with hyperglycemia: Secondary | ICD-10-CM

## 2024-05-02 LAB — POCT FASTING CBG KUC MANUAL ENTRY: POCT Glucose (KUC): 356 mg/dL — AB (ref 70–99)

## 2024-05-02 MED ORDER — CEPHALEXIN 500 MG PO CAPS: 500.0000 mg | ORAL_CAPSULE | Freq: Four times a day (QID) | ORAL | 0 refills | Status: AC

## 2024-05-02 NOTE — ED Triage Notes (Addendum)
 Patient states he thinks he was bitten by a bug 3-4 days ago. Patient has a raised are to the right neck/hairline and states the pain radiates into the right shoulder.  Patient states he took an Advil  yesterday.  Patient added that he had an insect bite tot he left side.

## 2024-05-02 NOTE — Discharge Instructions (Signed)
 Talk with your internal medicine doctor about your diabetes. It is not well controlled right now and you may need more or different medicines to help control it.
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
## 2024-05-12 ENCOUNTER — Other Ambulatory Visit (HOSPITAL_COMMUNITY): Payer: Self-pay

## 2024-05-14 ENCOUNTER — Other Ambulatory Visit: Payer: Self-pay

## 2024-05-28 ENCOUNTER — Ambulatory Visit: Payer: Self-pay | Admitting: Podiatry

## 2024-05-30 ENCOUNTER — Ambulatory Visit: Payer: Self-pay | Admitting: Podiatry

## 2024-05-30 DIAGNOSIS — E1140 Type 2 diabetes mellitus with diabetic neuropathy, unspecified: Secondary | ICD-10-CM

## 2024-05-30 DIAGNOSIS — L84 Corns and callosities: Secondary | ICD-10-CM

## 2024-05-30 DIAGNOSIS — E1149 Type 2 diabetes mellitus with other diabetic neurological complication: Secondary | ICD-10-CM

## 2024-05-31 NOTE — Progress Notes (Signed)
 Subjective:   Patient ID: [name], male   DOB: 33 y.o.   MRN: [id number]   HPI Patient presents stating these lesions have gotten severe again on both my feet and painful   ROS      Objective:  Physical Exam  Neurovascular status intact with severe lesion formation plantar aspect fifth metatarsal head bilateral that are thick and painful when pressed     Assessment:  Chronic lesion formation fifth metatarsal bilateral with patient being a diabetic who is doing a better job of taking care of himself but does work weightbearing job     Plan:  Deep debridement of lesions no breakdown of tissue noted we will have to continue the same course at this time

## 2024-06-18 ENCOUNTER — Other Ambulatory Visit: Payer: Self-pay | Admitting: Student

## 2024-06-18 DIAGNOSIS — F1111 Opioid abuse, in remission: Secondary | ICD-10-CM

## 2024-06-19 ENCOUNTER — Other Ambulatory Visit: Payer: Self-pay

## 2024-06-19 MED ORDER — BUPRENORPHINE HCL-NALOXONE HCL 8-2 MG SL SUBL
1.0000 | SUBLINGUAL_TABLET | Freq: Three times a day (TID) | SUBLINGUAL | 0 refills | Status: DC
  Filled 2024-06-19: qty 90, 30d supply, fill #0

## 2024-06-25 ENCOUNTER — Other Ambulatory Visit: Payer: Self-pay

## 2024-06-26 ENCOUNTER — Other Ambulatory Visit (HOSPITAL_COMMUNITY): Payer: Self-pay

## 2024-06-26 ENCOUNTER — Other Ambulatory Visit: Payer: Self-pay

## 2024-07-14 ENCOUNTER — Ambulatory Visit: Payer: Self-pay | Admitting: Podiatry

## 2024-07-24 ENCOUNTER — Ambulatory Visit: Payer: Self-pay | Admitting: Podiatry

## 2024-08-07 ENCOUNTER — Ambulatory Visit: Payer: Self-pay

## 2024-08-07 NOTE — Telephone Encounter (Signed)
 Called pt to see if he could come in this afternoon instead of waiting until Monday; stated he could not. Instructed if his symptoms worsen to go to [facility] - stated he would.

## 2024-08-07 NOTE — Telephone Encounter (Signed)
 FYI Only or Action Required?: FYI only for provider.  Patient was last seen in primary care on 03/17/2024 by Volney Leash, MD.  [facility] Nurse Triage reporting Blurred Vision.  Symptoms began several months ago.  Interventions attempted: Nothing.  Symptoms are: gradually worsening.  Triage Disposition: See PCP When Office is Open (Within 3 Days)  Patient/caregiver understands and will follow disposition?: Yes  Copied from CRM [id number]. Topic: Clinical - Red Word Triage >> Aug 07, 2024  9:37 AM Miquel SAILOR wrote: Red Word that prompted transfer to Nurse Triage: Diabetes issue/having issues with erection due to diabetes/blurry vision for 2-3 months has not changed Reason for Disposition  [1] Brief (now gone) blurred vision AND [2] unexplained  Answer Assessment - Initial Assessment Questions Vision used to be perfect- last few months it would be blurry for a few days then clear up. Thought it was his sugar being high--Running 300-500. He was supposed to get a ophthalmology referral last visit to assess and no one ever called him to set it up.   Starting a few days ago blurry more often. But his sugars have been controlled. Lately it has been 150's-300. It has been closer to 150 more often. Blurry vision seems more like dry eyes to him. He is able to blink a bunch and usually clear it up. Sometimes unilateral sometimes bilateral. No correlation to activity or sugar levels that he has seen. Denies any CVA sx, CP, or SOB. Appt found for Monday and placed on waitlist for sooner. Pt understands ED/UC/callback instructions.  Patient having issues with Erectile dysfunction as well. Happening more frequently as well.   1. DESCRIPTION: How has your vision changed? (e.g., complete vision loss, blurred vision, double vision, floaters, etc.)     Blurred vision  2. LOCATION: One or both eyes? If one, ask: Which eye?     Sometimes one eye- either one, and sometimes both 3. SEVERITY: Can  you see anything? If Yes, ask: What can you see? (e.g., fine print)     Affects near and far-  4. ONSET: When did this begin? Did it start suddenly or has this been gradual?     4-15months  5. PATTERN: Does this come and go, or has it been constant since it started?     Comes and goes- worse the last few days- 6. PAIN: Is there any pain in your eye(s)?  (Scale 1-10; or mild, moderate, severe)     Denies pain- feels like dry eyes  7. CONTACTS-GLASSES: Do you wear contacts or glasses?     Denies need 8. CAUSE: What do you think is causing this visual problem?     diabetes 9. OTHER SYMPTOMS: Do you have any other symptoms? (e.g., confusion, headache, arm or leg weakness, speech problems)     Occasional bilateral foot paresthesias  Protocols used: Vision Loss or Change-A-AH

## 2024-08-10 ENCOUNTER — Other Ambulatory Visit: Payer: Self-pay | Admitting: Student

## 2024-08-10 DIAGNOSIS — F1111 Opioid abuse, in remission: Secondary | ICD-10-CM

## 2024-08-11 ENCOUNTER — Ambulatory Visit: Payer: Self-pay | Admitting: Student

## 2024-08-12 ENCOUNTER — Other Ambulatory Visit: Payer: Self-pay

## 2024-08-12 ENCOUNTER — Ambulatory Visit: Payer: Self-pay

## 2024-08-12 VITALS — BP 130/87 | HR 68 | Temp 98.3°F | Ht 66.0 in | Wt 191.4 lb

## 2024-08-12 DIAGNOSIS — E1110 Type 2 diabetes mellitus with ketoacidosis without coma: Secondary | ICD-10-CM

## 2024-08-12 DIAGNOSIS — F1190 Opioid use, unspecified, uncomplicated: Secondary | ICD-10-CM

## 2024-08-12 DIAGNOSIS — Z91141 Patient's other noncompliance with medication regimen due to financial hardship: Secondary | ICD-10-CM

## 2024-08-12 DIAGNOSIS — H538 Other visual disturbances: Secondary | ICD-10-CM | POA: Insufficient documentation

## 2024-08-12 DIAGNOSIS — Z8249 Family history of ischemic heart disease and other diseases of the circulatory system: Secondary | ICD-10-CM

## 2024-08-12 DIAGNOSIS — R030 Elevated blood-pressure reading, without diagnosis of hypertension: Secondary | ICD-10-CM | POA: Insufficient documentation

## 2024-08-12 DIAGNOSIS — N529 Male erectile dysfunction, unspecified: Secondary | ICD-10-CM

## 2024-08-12 DIAGNOSIS — Z794 Long term (current) use of insulin: Secondary | ICD-10-CM

## 2024-08-12 DIAGNOSIS — E119 Type 2 diabetes mellitus without complications: Secondary | ICD-10-CM

## 2024-08-12 DIAGNOSIS — Z833 Family history of diabetes mellitus: Secondary | ICD-10-CM

## 2024-08-12 DIAGNOSIS — F17210 Nicotine dependence, cigarettes, uncomplicated: Secondary | ICD-10-CM

## 2024-08-12 LAB — POCT GLYCOSYLATED HEMOGLOBIN (HGB A1C): HbA1c, POC (controlled diabetic range): 11.6 % — AB (ref 0.0–7.0)

## 2024-08-12 LAB — GLUCOSE, CAPILLARY: Glucose-Capillary: 420 mg/dL — ABNORMAL HIGH (ref 70–99)

## 2024-08-12 MED ORDER — BASAGLAR KWIKPEN 100 UNIT/ML ~~LOC~~ SOPN
20.0000 [IU] | PEN_INJECTOR | Freq: Every day | SUBCUTANEOUS | 2 refills | Status: DC
  Filled 2024-08-12: qty 6, 30d supply, fill #0
  Filled 2024-09-21 – 2024-09-29 (×2): qty 6, 30d supply, fill #1

## 2024-08-12 MED ORDER — TADALAFIL 10 MG PO TABS
10.0000 mg | ORAL_TABLET | ORAL | 1 refills | Status: DC | PRN
  Filled 2024-08-12: qty 20, 30d supply, fill #0
  Filled 2024-09-21 – 2024-09-29 (×2): qty 20, 30d supply, fill #1

## 2024-08-12 MED ORDER — METFORMIN HCL 500 MG PO TABS
1000.0000 mg | ORAL_TABLET | Freq: Two times a day (BID) | ORAL | 2 refills | Status: AC
  Filled 2024-08-12: qty 120, 30d supply, fill #0
  Filled 2024-09-29: qty 120, 30d supply, fill #1

## 2024-08-12 MED ORDER — BUPRENORPHINE HCL-NALOXONE HCL 8-2 MG SL SUBL
1.0000 | SUBLINGUAL_TABLET | Freq: Three times a day (TID) | SUBLINGUAL | 0 refills | Status: DC
  Filled 2024-08-12: qty 90, 30d supply, fill #0

## 2024-08-12 NOTE — Assessment & Plan Note (Signed)
 Patient states that he has trouble initiating erection and does not get any morning erections.  Denies any issues with early ejaculation or achieving ejaculation.  Patient is currently still smoking and also has type 2 diabetes that is not controlled which also contributes to these issues.  Did counsel patient on continued control of diabetes and considering smoking cessation.  Patient states that he is trying to cut back on cigarettes at this time.  Patient has been previously trialed on Cialis  and states this worked well enough  Plan: In the absence of any cardiac history, will restart Cialis  10 mg.  Counseling given to patient on only taking this medication once a day

## 2024-08-12 NOTE — Assessment & Plan Note (Signed)
 Patient's A1c today is 11.6, up from 10.3 previously Patient states that due to cost he has not been able to use his basglar and instead has been self titrating his insulin  with lantus  Patient states that he gets dexcom from one of his friends. Patient states that he rarely checks his blood sugars, but when he does they are usually high. He states one blood sugar was about 220 after he had a snack. Blood sugar today is in the 400s.   Plan: Will trial patient on metformin . Regimen will be as follows to minimize side effects:  Week 1 : 500 mg in the morning  Week 2 : 500 mg in the morning and the evening Week 3: 1000 mg in the morning and 500 in the evening Week 4: 1000 mg in the morning and 1000 mg in the evening  Encouraged patient to check blood sugars with glucometer at least once a day fasting and then 1-2 hours after his biggest meal and write these down. Patient is self pay, so encouraged him to fill out financial assistance paperwork as CGM should be covered with this  Will start patient on basaglar  20 units as patient has been on 28 units previously. Did caution patient against self titrating   Microalbumine/ creatinine ratio ordered at this visit

## 2024-08-12 NOTE — Progress Notes (Signed)
 Established Patient Office Visit  Subjective   Patient ID: [name], male    DOB: Sep 18, 1991  Age: 33 y.o. MRN: [id number]  Chief Complaint  Patient presents with   Diabetes   Referral    Podiatry, patient has calluses on both of his feet     HPI Patient is a 33 year old male with past medical history uncontrolled type 2 diabetes mellitus with diabetic neuropathy, erectile dysfunction, tobacco use that presents today for follow-up.  See problem-based assessment for details.   ROS See Problem-based assessment for details   Objective:     BP 130/87 (BP Location: Left Arm, Patient Position: Sitting, Cuff Size: Normal)   Pulse 68   Temp 98.3 F (36.8 C) (Oral)   Ht 5' 6 (1.676 m)   Wt 191 lb 6.4 oz (86.8 kg)   SpO2 97%   BMI 30.89 kg/m  BP Readings from Last 3 Encounters:  08/12/24 130/87  05/02/24 (!) 135/90  03/17/24 (!) 155/96   Wt Readings from Last 3 Encounters:  08/12/24 191 lb 6.4 oz (86.8 kg)  04/11/24 191 lb (86.6 kg)  03/17/24 191 lb 9.6 oz (86.9 kg)      Physical Exam Constitution: Alert, no acute distress Eyes: No conjunctival injection noted bilaterally, extraocular movements intact.  Pupils are reactive to light Heart: Regular rate and rhythm, no murmurs. Lungs: Effort normal, lungs clear to auscultation Feet: Palpable DP pulses bilaterally, no open wounds noted  Results for orders placed or performed in visit on 08/12/24  Glucose, capillary  Result Value Ref Range   Glucose-Capillary 420 (H) 70 - 99 mg/dL  POC Hbg J8R  Result Value Ref Range   Hemoglobin A1C     HbA1c POC (<> result, manual entry)     HbA1c, POC (prediabetic range)     HbA1c, POC (controlled diabetic range) 11.6 (A) 0.0 - 7.0 %      The ASCVD Risk score (Arnett DK, et al., 2019) failed to calculate for the following reasons:   The 2019 ASCVD risk score is only valid for ages 34 to 48    Assessment & Plan:   Problem List Items Addressed This Visit      Opioid use disorder   Patient reports that he has been taking about 2-3 of his suboxone  tablets a day. Cost has been a barrier for patient to be able to get suboxone  which is why he has not been filling it regularly. Last tox assure was in June and was clear of any unexpected substances   Plan: Told patient to continue suboxone  as prescribed If cost is a barrier, please inform us        Erectile dysfunction   Patient states that he has trouble initiating erection and does not get any morning erections.  Denies any issues with early ejaculation or achieving ejaculation.  Patient is currently still smoking and also has type 2 diabetes that is not controlled which also contributes to these issues.  Did counsel patient on continued control of diabetes and considering smoking cessation.  Patient states that he is trying to cut back on cigarettes at this time.  Patient has been previously trialed on Cialis  and states this worked well enough  Plan: In the absence of any cardiac history, will restart Cialis  10 mg.  Counseling given to patient on only taking this medication once a day      Insulin  dependent type 2 diabetes mellitus (HCC)   Patient's A1c today is 11.6, up from 10.3  previously Patient states that due to cost he has not been able to use his basglar and instead has been self titrating his insulin  with lantus  Patient states that he gets dexcom from one of his friends. Patient states that he rarely checks his blood sugars, but when he does they are usually high. He states one blood sugar was about 220 after he had a snack. Blood sugar today is in the 400s.   Plan: Will trial patient on metformin . Regimen will be as follows to minimize side effects:  Week 1 : 500 mg in the morning  Week 2 : 500 mg in the morning and the evening Week 3: 1000 mg in the morning and 500 in the evening Week 4: 1000 mg in the morning and 1000 mg in the evening  Encouraged patient to check blood sugars with  glucometer at least once a day fasting and then 1-2 hours after his biggest meal and write these down. Patient is self pay, so encouraged him to fill out financial assistance paperwork as CGM should be covered with this  Will start patient on basaglar  20 units as patient has been on 28 units previously. Did caution patient against self titrating   Microalbumine/ creatinine ratio ordered at this visit         Relevant Medications   metFORMIN  (GLUCOPHAGE ) 500 MG tablet   Insulin  Glargine (BASAGLAR  KWIKPEN) 100 UNIT/ML   Elevated blood pressure reading in office without diagnosis of hypertension   Patient's blood pressure today was initially 146/96 but on repeat was 130/87.  At this time will not add any blood pressure management.  If patient in the future has elevated blood pressure readings might benefit from monitoring at home and addition of a ARB      Blurry vision   Patient states that this has been going on for about a year.  States that his eyes feel grainy and dry.  This blurry vision comes and goes.  Patient states that he notices them more during transition of seasons.  States that he will use lubricating eyedrops and this helps some but the blurry vision comes back.  Patient states that he noticed the blurry vision affects his near and far vision as well.  Peripheral vision and extraocular movements intact on exam.  Plan: Patient is self-pay at this time so did recommend following up with an optometrist for diabetic eye exam and for blurry vision.      Other Visit Diagnoses       Type 2 diabetes mellitus with ketoacidosis without coma, unspecified whether long term insulin  use (HCC)    -  Primary   Relevant Medications   metFORMIN  (GLUCOPHAGE ) 500 MG tablet   Insulin  Glargine (BASAGLAR  KWIKPEN) 100 UNIT/ML   Other Relevant Orders   POC Hbg A1C (Completed)   Microalbumin / Creatinine Urine Ratio        Return in about 3 months (around 11/12/2024).    Saloni Lablanc D'Mello,  DO Patient seen with Dr.Machen

## 2024-08-12 NOTE — Assessment & Plan Note (Signed)
 Patient reports that he has been taking about 2-3 of his suboxone  tablets a day. Cost has been a barrier for patient to be able to get suboxone  which is why he has not been filling it regularly. Last tox assure was in June and was clear of any unexpected substances   Plan: Told patient to continue suboxone  as prescribed If cost is a barrier, please inform us 

## 2024-08-12 NOTE — Assessment & Plan Note (Signed)
 Patient's blood pressure today was initially 146/96 but on repeat was 130/87.  At this time will not add any blood pressure management.  If patient in the future has elevated blood pressure readings might benefit from monitoring at home and addition of a ARB

## 2024-08-12 NOTE — Assessment & Plan Note (Signed)
 Patient states that this has been going on for about a year.  States that his eyes feel grainy and dry.  This blurry vision comes and goes.  Patient states that he notices them more during transition of seasons.  States that he will use lubricating eyedrops and this helps some but the blurry vision comes back.  Patient states that he noticed the blurry vision affects his near and far vision as well.  Peripheral vision and extraocular movements intact on exam.  Plan: Patient is self-pay at this time so did recommend following up with an optometrist for diabetic eye exam and for blurry vision.

## 2024-08-12 NOTE — Patient Instructions (Addendum)
 Today we discussed the following medical conditions and plan:    For your metformin  take: Week 1 : 500 mg in the morning  Week 2 : 500 mg in the morning and the evening Week 3: 1000 mg in the morning and 500 in the evening Week 4: 1000 mg in the morning and 1000 mg in the evening  Also take basaglar  20 once a day and try to monitor your blood sugars   For your erectile dysfunction, trying to quit smoking and working on getting your diabetes under control will help. In the meantime I will give you cialis  10 mg. Only take this once a day  For your eyes, you can go see an optometrist. I will try to see if we can set you up with someone in our clinic to do your diabetic eye exam   We look forward to seeing you next time. Please call our clinic at [phone] if you have any questions or concerns. The best time to call is Monday-Friday from 9am-4pm, but there is someone available 24/7. If you need medication refills, please notify your pharmacy one week in advance and they will send us  a request.   Thank you for trusting me with your care. Wishing you the best!   Codee Bloodworth D'Mello, DO  [facility]

## 2024-08-13 ENCOUNTER — Telehealth: Payer: Self-pay

## 2024-08-13 ENCOUNTER — Ambulatory Visit: Payer: Self-pay

## 2024-08-13 LAB — MICROALBUMIN / CREATININE URINE RATIO
Creatinine, Urine: 33.8 mg/dL
Microalb/Creat Ratio: 16 mg/g{creat} (ref 0–29)
Microalbumin, Urine: 5.4 ug/mL

## 2024-08-13 NOTE — Telephone Encounter (Signed)
 Patient stated that he was not able to pick up suboxone  due to cost and financial strain at this time. Seems that pharmacy gave him forms to fill out to get assistance with this. We have also given patient forms to fill out to help get insurance coverage. Encouraged patient to fill out these forms which will aid in bringing out of pocket cost down

## 2024-08-13 NOTE — Progress Notes (Signed)
 Internal Medicine Clinic Attending  I was physically present during the key portions of the resident provided service and participated in the medical decision making of patient's management care. I reviewed pertinent patient test results.  The assessment, diagnosis, and plan were formulated together and I agree with the documentation in the resident's note.  Roger Clarity, MD    Patient has been out of insulin , and unfortunately A1C is 11.6 today. No symptoms of DKA. We will restart insulin  (glargine 20 units daily via DOT program) and metformin  (uptitrate to 1g BID) We placed a referral to [facility] for further diabetes education, and have inquired within our office about getting patient a CGM via a research program.  Will arrange follow up in 40-month, rather than 3.

## 2024-08-18 ENCOUNTER — Ambulatory Visit: Payer: Self-pay | Admitting: Student

## 2024-08-19 ENCOUNTER — Other Ambulatory Visit: Payer: Self-pay

## 2024-09-21 ENCOUNTER — Other Ambulatory Visit: Payer: Self-pay | Admitting: Student

## 2024-09-21 DIAGNOSIS — F1111 Opioid abuse, in remission: Secondary | ICD-10-CM

## 2024-09-22 ENCOUNTER — Ambulatory Visit (HOSPITAL_COMMUNITY)
Admission: EM | Admit: 2024-09-22 | Discharge: 2024-09-22 | Disposition: A | Discharge status: Home / Self Care | Payer: Self-pay

## 2024-09-22 ENCOUNTER — Emergency Department (HOSPITAL_COMMUNITY)
Admission: EM | Admit: 2024-09-22 | Discharge: 2024-09-22 | Disposition: A | Discharge status: Home / Self Care | Payer: Self-pay | Source: Ambulatory Visit | Attending: Emergency Medicine | Admitting: Emergency Medicine

## 2024-09-22 ENCOUNTER — Other Ambulatory Visit: Payer: Self-pay

## 2024-09-22 ENCOUNTER — Encounter (HOSPITAL_COMMUNITY): Payer: Self-pay

## 2024-09-22 ENCOUNTER — Emergency Department (HOSPITAL_COMMUNITY): Payer: Self-pay

## 2024-09-22 DIAGNOSIS — L98499 Non-pressure chronic ulcer of skin of other sites with unspecified severity: Secondary | ICD-10-CM | POA: Diagnosis not present

## 2024-09-22 DIAGNOSIS — E11621 Type 2 diabetes mellitus with foot ulcer: Secondary | ICD-10-CM

## 2024-09-22 DIAGNOSIS — I10 Essential (primary) hypertension: Secondary | ICD-10-CM | POA: Insufficient documentation

## 2024-09-22 DIAGNOSIS — L97509 Non-pressure chronic ulcer of other part of unspecified foot with unspecified severity: Secondary | ICD-10-CM

## 2024-09-22 DIAGNOSIS — Z794 Long term (current) use of insulin: Secondary | ICD-10-CM | POA: Insufficient documentation

## 2024-09-22 DIAGNOSIS — E11628 Type 2 diabetes mellitus with other skin complications: Secondary | ICD-10-CM

## 2024-09-22 DIAGNOSIS — J45909 Unspecified asthma, uncomplicated: Secondary | ICD-10-CM | POA: Diagnosis not present

## 2024-09-22 DIAGNOSIS — E11622 Type 2 diabetes mellitus with other skin ulcer: Secondary | ICD-10-CM | POA: Diagnosis not present

## 2024-09-22 DIAGNOSIS — Z4800 Encounter for change or removal of nonsurgical wound dressing: Secondary | ICD-10-CM | POA: Diagnosis present

## 2024-09-22 DIAGNOSIS — Z7984 Long term (current) use of oral hypoglycemic drugs: Secondary | ICD-10-CM

## 2024-09-22 DIAGNOSIS — L089 Local infection of the skin and subcutaneous tissue, unspecified: Secondary | ICD-10-CM

## 2024-09-22 DIAGNOSIS — E1165 Type 2 diabetes mellitus with hyperglycemia: Secondary | ICD-10-CM | POA: Diagnosis not present

## 2024-09-22 DIAGNOSIS — R000 Tachycardia, unspecified: Secondary | ICD-10-CM

## 2024-09-22 DIAGNOSIS — E1140 Type 2 diabetes mellitus with diabetic neuropathy, unspecified: Secondary | ICD-10-CM | POA: Insufficient documentation

## 2024-09-22 LAB — CBC WITH DIFFERENTIAL/PLATELET
Abs Immature Granulocytes: 0.03 K/uL (ref 0.00–0.07)
Basophils Absolute: 0.1 K/uL (ref 0.0–0.1)
Basophils Relative: 1 %
Eosinophils Absolute: 0.4 K/uL (ref 0.0–0.5)
Eosinophils Relative: 4 %
HCT: 44.5 % (ref 39.0–52.0)
Hemoglobin: 15.7 g/dL (ref 13.0–17.0)
Immature Granulocytes: 0 %
Lymphocytes Relative: 29 %
Lymphs Abs: 2.8 K/uL (ref 0.7–4.0)
MCH: 30.6 pg (ref 26.0–34.0)
MCHC: 35.3 g/dL (ref 30.0–36.0)
MCV: 86.7 fL (ref 80.0–100.0)
Monocytes Absolute: 0.5 K/uL (ref 0.1–1.0)
Monocytes Relative: 5 %
Neutro Abs: 5.8 K/uL (ref 1.7–7.7)
Neutrophils Relative %: 61 %
Platelets: 241 K/uL (ref 150–400)
RBC: 5.13 MIL/uL (ref 4.22–5.81)
RDW: 11.6 % (ref 11.5–15.5)
WBC: 9.5 K/uL (ref 4.0–10.5)
nRBC: 0 % (ref 0.0–0.2)

## 2024-09-22 LAB — COMPREHENSIVE METABOLIC PANEL WITH GFR
ALT: 9 U/L (ref 0–44)
AST: 14 U/L — ABNORMAL LOW (ref 15–41)
Albumin: 4 g/dL (ref 3.5–5.0)
Alkaline Phosphatase: 119 U/L (ref 38–126)
Anion gap: 8 (ref 5–15)
BUN: 14 mg/dL (ref 6–20)
CO2: 28 mmol/L (ref 22–32)
Calcium: 8.7 mg/dL — ABNORMAL LOW (ref 8.9–10.3)
Chloride: 99 mmol/L (ref 98–111)
Creatinine, Ser: 0.73 mg/dL (ref 0.61–1.24)
GFR, Estimated: >60 mL/min (ref >60)
Glucose, Bld: 341 mg/dL — ABNORMAL HIGH (ref 70–99)
Potassium: 4.4 mmol/L (ref 3.5–5.1)
Sodium: 135 mmol/L (ref 135–145)
Total Bilirubin: 0.8 mg/dL (ref 0.0–1.2)
Total Protein: 7.3 g/dL (ref 6.5–8.1)

## 2024-09-22 LAB — I-STAT CG4 LACTIC ACID, ED: Lactic Acid, Venous: 0.8 mmol/L (ref 0.5–1.9)

## 2024-09-22 MED ORDER — BUPRENORPHINE HCL-NALOXONE HCL 8-2 MG SL SUBL
1.0000 | SUBLINGUAL_TABLET | Freq: Three times a day (TID) | SUBLINGUAL | 0 refills | Status: DC
  Filled 2024-09-22: qty 90, 30d supply, fill #0

## 2024-09-22 MED ORDER — LACTATED RINGERS IV BOLUS
1000.0000 mL | Freq: Once | INTRAVENOUS | Status: AC
  Administered 2024-09-22: 1000 mL via INTRAVENOUS

## 2024-09-22 MED ORDER — DOXYCYCLINE HYCLATE 100 MG PO CAPS: 100.0000 mg | ORAL_CAPSULE | Freq: Two times a day (BID) | ORAL | 0 refills | Status: AC

## 2024-09-22 NOTE — Discharge Instructions (Signed)
 You were seen in the ER today for concerns of a skin infection. There does not appear to be any concerns of infection in the bone but with worsening pain in these areas, I am concerned that there may infection developing and have started you on a course of antibiotics. Please follow up closely with podiatry for further evaluation. Return to the ER for any concerns of new or worsening symptoms.

## 2024-09-22 NOTE — ED Provider Triage Note (Signed)
 Emergency Medicine Provider Triage Evaluation Note  [name] , a 33 y.o. male  was evaluated in triage.  Pt complains of foot wound.  Patient reportedly was advised come from urgent care for evaluation with foot wound.  He reports this wound is not present for several days and has been progressively becoming darker.  He denies any significant pain.  States that there are some threatening redness and dark skin from this area.  He states that he has previously been seen by [facility] for generalized wound care management with his type 2 diabetes.  Review of Systems  Positive: As above Negative: As above  Physical Exam  BP (!) 136/106   Pulse (!) 105   Temp 98.2 F (36.8 C)   Resp 17   SpO2 98%  Gen:   Awake, no distress   Resp:  Normal effort  MSK:   Moves extremities without difficulty  Other:  Possibly developing necrotic wound to the medial aspect of the right great toe.  There is some spreading redness out of this area but no significant pain.  No drainage seen.  Medical Decision Making  Medically screening exam initiated at 3:24 PM.  Appropriate orders placed.  [name] was informed that the remainder of the evaluation will be completed by another provider, this initial triage assessment does not replace that evaluation, and the importance of remaining in the ED until their evaluation is complete.     [name] A, PA-C 09/22/24 1525

## 2024-09-22 NOTE — ED Notes (Signed)
 Patient is being discharged from the Urgent Care and sent to the Emergency Department via POV . Per Jon, NP, patient is in need of higher level of care due to necrotic toe. Patient is aware and verbalizes understanding of plan of care.  Vitals:   09/22/24 1217  BP: 128/80  Pulse: (!) 102  Resp: 16  Temp: 98.3 F (36.8 C)  SpO2: 97%
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
## 2024-09-22 NOTE — ED Triage Notes (Signed)
 Pt c/o open sores to bottom and side of rt foot x2 wks. States has had surgery for same. States has a appt with foot dr 12/03. Denies taken any meds.

## 2024-09-22 NOTE — ED Triage Notes (Signed)
 PT arrives via POV. PT reports he has had a wound on right great toe for a while now. Pt was [facility] ed from [facility]. Right great toe does appear necrotic. He denies fevers. He is a diabetic. Pt is AxOx4.

## 2024-09-22 NOTE — ED Provider Notes (Signed)
  EMERGENCY DEPARTMENT AT [facility] Provider Note   CSN: [id number] Arrival date & time: 09/22/24  1404     Patient presents with: Wound Check   [name] is a 33 y.o. male.  Patient with past history significant for type 2 diabetes, diabetic neuropathy, hypertension, asthma presents to the emergency department with concerns of a wound check.  Reportedly seen in urgent care twice, for evaluation.  He reports that he has been getting wound debridements at podiatry recently for painful calluses on right foot.   Wound Check       Prior to Admission medications   Medication Sig Start Date End Date Taking? Authorizing Provider  doxycycline  (VIBRAMYCIN ) 100 MG capsule Take 1 capsule (100 mg total) by mouth 2 (two) times daily for 7 days. 09/22/24 09/29/24 Yes Rahkim Rabalais A, PA-C  B-D ULTRA-FINE 33 LANCETS MISC Use one lancet once daily. 06/08/22     blood glucose meter kit and supplies KIT Use up to four times daily as directed. 05/17/22   Nguyen, Quan, DO  Blood Glucose Monitoring Suppl (ONETOUCH VERIO) w/Device KIT Use once daily. 06/08/22     Blood Glucose Monitoring Suppl (TRUE METRIX METER) w/Device KIT USE AS DIRECTED 06/22/21   Trudy Mliss Dragon, MD  buprenorphine -naloxone  (SUBOXONE ) 8-2 mg SUBL SL tablet Place 1 tablet under the tongue 3 (three) times daily. 09/22/24   Nooruddin, Saad, MD  clindamycin  (CLINDAGEL ) 1 % gel Apply topically 2 (two) times daily. 01/17/24   Nooruddin, Saad, MD  Continuous Glucose Sensor (DEXCOM G7 SENSOR) MISC Please use to check glucose every 10 days 01/17/24   Nooruddin, Saad, MD  famotidine  (PEPCID ) 20 MG tablet Take 1 tablet (20 mg total) by mouth 2 (two) times daily. 07/26/23   Arloa Suzen RAMAN, NP  glucose blood (FREESTYLE LITE) test strip use up to 4 times daily to check blood sugar 05/17/22     glucose blood ([employer] BLOOD GLUCOSE TEST) test strip Use to test once daily. 06/08/22     glucose blood test strip 1 each by  Other route 4 (four) times daily. Use as instructed 03/16/22   Nguyen, Quan, DO  Insulin  Glargine (BASAGLAR  [facility]) 100 UNIT/ML Inject 20 Units into the skin daily. 08/12/24 03/25/25  D'Mello, Rosalyn, DO  Insulin  Pen Needle (PEN NEEDLES 31GX5/16) 31G X 8 MM MISC Use 1 (one) Syringe as directed 06/08/22     Insulin  Pen Needle (UNIFINE PENTIPS) 32G X 4 MM MISC Use 4 times daily 03/16/22   Nguyen, Quan, DO  Lancets (FREESTYLE) lancets use up to 4 times daily to check blood sugar 05/17/22     metFORMIN  (GLUCOPHAGE ) 500 MG tablet Take 2 tablets (1,000 mg total) by mouth 2 (two) times daily with a meal. 08/12/24 11/10/24  D'Mello, Rosalyn, DO  ondansetron  (ZOFRAN ) 4 MG tablet Take 1 tablet (4 mg total) by mouth every 8 (eight) hours as needed for nausea or vomiting. 07/26/23   Arloa Suzen RAMAN, NP  tadalafil  (CIALIS ) 10 MG tablet Take 1 tablet (10 mg total) by mouth as needed for erectile dysfunction. 08/12/24 08/12/25  D'Mello, Rosalyn, DO    Allergies: Vicodin [hydrocodone-acetaminophen ], Egg protein-containing drug products, and Lactose intolerance (gi)    Review of Systems  Skin:  Positive for wound.  All other systems reviewed and are negative.   Updated Vital Signs BP 134/69 (BP Location: Right Arm)   Pulse 92   Temp 98.9 F (37.2 C) (Oral)   Resp 18   SpO2 98%  Physical Exam Vitals and nursing note reviewed.  Constitutional:      General: He is not in acute distress.    Appearance: He is well-developed.  HENT:     Head: Normocephalic and atraumatic.  Eyes:     Conjunctiva/sclera: Conjunctivae normal.  Cardiovascular:     Rate and Rhythm: Normal rate and regular rhythm.     Pulses:          Dorsalis pedis pulses are 1+ on the right side and 1+ on the left side.       Posterior tibial pulses are 1+ on the right side and 1+ on the left side.     Heart sounds: No murmur heard. Pulmonary:     Effort: Pulmonary effort is normal. No respiratory distress.     Breath sounds: Normal  breath sounds.  Abdominal:     Palpations: Abdomen is soft.     Tenderness: There is no abdominal tenderness.  Musculoskeletal:        General: No swelling.     Cervical back: Neck supple.  Skin:    General: Skin is warm and dry.     Capillary Refill: Capillary refill takes less than 2 seconds.     Findings: Lesion present.     Comments: Circular darkened lesion to the right toe along the plantar surface. No drainage seen. There is some redness around the wound.  Neurological:     Mental Status: He is alert.  Psychiatric:        Mood and Affect: Mood normal.         (all labs ordered are listed, but only abnormal results are displayed) Labs Reviewed  COMPREHENSIVE METABOLIC PANEL WITH GFR - Abnormal; Notable for the following components:      Result Value   Glucose, Bld 341 (*)    Calcium 8.7 (*)    AST 14 (*)    All other components within normal limits  CBC WITH DIFFERENTIAL/PLATELET  I-STAT CG4 LACTIC ACID, ED    EKG: None  Radiology: DG Foot Complete Right Result Date: 09/22/2024 CLINICAL DATA:  Possible osteomyelitis. EXAM: RIGHT FOOT COMPLETE - 3+ VIEW COMPARISON:  None Available. FINDINGS: Large soft tissue ulceration along the medial aspect of the first interphalangeal joint. No underlying osseous erosion to suggest osteomyelitis. Mild hallux valgus. IMPRESSION: Large soft tissue ulceration along the medial aspect of the first interphalangeal joint without evidence of underlying osteomyelitis. Electronically Signed   By: Newell Eke M.D.   On: 09/22/2024 16:09     Procedures   Medications Ordered in the ED  lactated ringers  bolus 1,000 mL (1,000 mLs Intravenous New Bag/Given 09/22/24 1706)                                    Medical Decision Making Amount and/or Complexity of Data Reviewed Labs: ordered. Radiology: ordered.   This patient presents to the ED for concern of wound evaluation, this involves an extensive number of treatment options, and  is a complaint that carries with it a high risk of complications and morbidity.  The differential diagnosis includes cellulitis, dry gangrene, diabetic foot ulcer, sepsis   Co morbidities that complicate the patient evaluation  Type 2 diabetes   Additional history obtained:  Additional history obtained from chart review   Lab Tests:  I Ordered, and personally interpreted labs.  The pertinent results include: CBC unremarkable, CMP with mild hypocalcemia hyperglycemia  341, lactic acid reassuring at 0.8   Imaging Studies ordered:  I ordered imaging studies including xray right foot  I independently visualized and interpreted imaging which showed large soft tissue ulceration along the medial aspect of the first interphalangeal joint without evidence of underlying osteomyelitis. I agree with the radiologist interpretation   Consultations Obtained:  I requested consultation with none,  and discussed lab and imaging findings as well as pertinent plan - they recommend: N/A   Problem List / ED Course / Critical interventions / Medication management  Patient presented to the emergency department with concerns of pain.  Reports that he was seen at urgent care and advised to come in for evaluation.  Reports that he currently follows with podiatry at [facility] for painful calluses to plantar surface of the foot along the great toe and the fifth digit.  He denies any significant discharge, drainage, or any notable concerns except some darkening of the area that has been debrided by podiatry.  He is not currently on any antibiotics.  States that he is trying to be better about manage his glucose levels at this type 2 diabetes. On exam, patient has considerable wounds to the base of the right great toe and the base of the left fifth digit.  No drainage is seen.  See images above.  No significant erythema or skin induration around these areas.  Palpable pulses in DP, PT of the right  foot Labs are reassuring at this time with no leukocytosis or elevation in lactic acid level. Xray negative for signs of osteomyelitis but large tissue ulceration is seen. Given reassuring imaging and labs, low concern for osteomyelitis or gangrenous type infection. Suspect possible cellulitis developing with increased pain in this area and will start on a short course of doxycycline . He has an appointment with podiatry scheduled for early December and I advised that he keep this appointment for repeat evaluation. Advised Tylenol  and ibuprofen  for pain as needed. He is otherwise stable for outpatient follow up and discharged home. I ordered medication including fluids  for hyperglycemia  Reevaluation of the patient after these medicines showed that the patient improved I have reviewed the patients home medicines and have made adjustments as needed   Social Determinants of Health:  None   Test / Admission - Considered:  Considered but stable for outpatient follow up.  Final diagnoses:  Skin ulcer due to diabetes mellitus [facility])    ED Discharge Orders          Ordered    doxycycline  (VIBRAMYCIN ) 100 MG capsule  2 times daily        09/22/24 1813               Jhoana Upham A, PA-C 09/22/24 1814    Lenor Hollering, MD 09/22/24 2230

## 2024-09-22 NOTE — Discharge Instructions (Addendum)
 Patient is advised to go to emergency department for further evaluation and treatment of right great toe lesion.

## 2024-09-24 ENCOUNTER — Other Ambulatory Visit: Payer: Self-pay

## 2024-09-26 ENCOUNTER — Other Ambulatory Visit: Payer: Self-pay

## 2024-09-29 ENCOUNTER — Ambulatory Visit (INDEPENDENT_AMBULATORY_CARE_PROVIDER_SITE_OTHER): Payer: Self-pay | Admitting: Podiatry

## 2024-09-29 ENCOUNTER — Encounter (HOSPITAL_BASED_OUTPATIENT_CLINIC_OR_DEPARTMENT_OTHER): Payer: Self-pay

## 2024-09-29 ENCOUNTER — Encounter: Payer: Self-pay | Admitting: Podiatry

## 2024-09-29 ENCOUNTER — Other Ambulatory Visit: Payer: Self-pay

## 2024-09-29 VITALS — Ht <= 58 in

## 2024-09-29 DIAGNOSIS — E1110 Type 2 diabetes mellitus with ketoacidosis without coma: Secondary | ICD-10-CM

## 2024-09-29 DIAGNOSIS — M216X2 Other acquired deformities of left foot: Secondary | ICD-10-CM

## 2024-09-29 DIAGNOSIS — E1149 Type 2 diabetes mellitus with other diabetic neurological complication: Secondary | ICD-10-CM

## 2024-09-29 DIAGNOSIS — M216X1 Other acquired deformities of right foot: Secondary | ICD-10-CM | POA: Diagnosis not present

## 2024-09-29 DIAGNOSIS — E1140 Type 2 diabetes mellitus with diabetic neuropathy, unspecified: Secondary | ICD-10-CM | POA: Diagnosis not present

## 2024-09-29 DIAGNOSIS — L97512 Non-pressure chronic ulcer of other part of right foot with fat layer exposed: Secondary | ICD-10-CM | POA: Diagnosis not present

## 2024-09-29 MED ORDER — UNIFINE PENTIPS 32G X 4 MM MISC: 1.0000 | Freq: Four times a day (QID) | 5 refills | Status: AC

## 2024-09-29 MED ORDER — INSULIN GLARGINE 100 UNIT/ML ~~LOC~~ SOLN
20.0000 [IU] | Freq: Every day | SUBCUTANEOUS | 11 refills | Status: DC
  Filled 2024-09-29: qty 10, 28d supply, fill #0

## 2024-09-29 MED ORDER — INSULIN GLARGINE 100 UNIT/ML SOLOSTAR PEN
20.0000 [IU] | PEN_INJECTOR | Freq: Every day | SUBCUTANEOUS | 11 refills | Status: AC
  Filled 2024-09-29 – 2024-10-29 (×2): qty 15, 75d supply, fill #0

## 2024-09-29 MED ORDER — DOXYCYCLINE HYCLATE 100 MG PO TABS: 100.0000 mg | ORAL_TABLET | Freq: Two times a day (BID) | ORAL | 1 refills | Status: AC

## 2024-09-29 NOTE — Addendum Note (Signed)
 Addended by: KEM NA on: 09/29/2024 12:01 PM   Modules accepted: Orders

## 2024-09-29 NOTE — Progress Notes (Signed)
 Subjective:   Patient ID: [name], male   DOB: 33 y.o.   MRN: [id number]   HPI Patient presents stating that he started to develop odor and discoloration in the right big toe and also has problems right fifth metatarsal and they did not see a infection on x-ray they did give antibiotics he states it is doing better but still thick and still has odor   ROS      Objective:  Physical Exam  Vascular status adequate neurologically significant diminishment sharp dull vibratory A1c 11.6 at last visit with large keratotic tissue formation right plantar hallux localized just with slight breakdown of tissue but mostly just thick tissue with no subcutaneous tendon or bone exposure with keratotic tissue subfifth metatarsal     Assessment:  Patient who takes very poor control of diabetes with a lot of high A1c who developed severely thickened keratotic tissue     Plan:  H&P reviewed [facility] sterile debridement of all tissue placed him back on doxycycline  for the next 2 weeks and instructed on wet-to-dry soaks and applied sterile dressing today.  Strict instructions of any drainage were to start redness proximal swelling systemic temperature he is to go straight to the emergency room and he understands ultimately he does run high risk of amputation especially with A1c out-of-control

## 2024-09-29 NOTE — Addendum Note (Signed)
 Addended by: KEM NA on: 09/29/2024 02:26 PM   Modules accepted: Orders

## 2024-09-30 ENCOUNTER — Other Ambulatory Visit: Payer: Self-pay

## 2024-09-30 MED ORDER — UNIFINE PENTIPS 32G X 4 MM MISC
1.0000 | Freq: Four times a day (QID) | 5 refills | Status: AC
  Filled 2024-09-30: qty 100, 25d supply, fill #0

## 2024-10-01 ENCOUNTER — Other Ambulatory Visit: Payer: Self-pay

## 2024-10-01 ENCOUNTER — Ambulatory Visit: Payer: Self-pay | Admitting: Podiatry

## 2024-10-06 ENCOUNTER — Other Ambulatory Visit: Payer: Self-pay

## 2024-10-07 ENCOUNTER — Other Ambulatory Visit: Payer: Self-pay

## 2024-10-08 ENCOUNTER — Other Ambulatory Visit: Payer: Self-pay

## 2024-10-09 ENCOUNTER — Other Ambulatory Visit: Payer: Self-pay

## 2024-10-13 ENCOUNTER — Telehealth: Payer: Self-pay

## 2024-10-13 NOTE — Telephone Encounter (Signed)
 Patient Orthotics are in.

## 2024-10-22 ENCOUNTER — Ambulatory Visit: Admitting: Podiatry

## 2024-10-29 ENCOUNTER — Other Ambulatory Visit: Payer: Self-pay

## 2024-10-29 ENCOUNTER — Other Ambulatory Visit: Payer: Self-pay | Admitting: Student

## 2024-10-29 DIAGNOSIS — F1111 Opioid abuse, in remission: Secondary | ICD-10-CM

## 2024-10-29 NOTE — Telephone Encounter (Signed)
 Orthotics are in, to be dispensed 11/06/24 on visit with Dr. Magdalen

## 2024-10-31 MED ORDER — BUPRENORPHINE HCL-NALOXONE HCL 8-2 MG SL SUBL
1.0000 | SUBLINGUAL_TABLET | Freq: Three times a day (TID) | SUBLINGUAL | 0 refills | Status: AC
  Filled 2024-10-31: qty 90, 30d supply, fill #0

## 2024-11-01 ENCOUNTER — Other Ambulatory Visit: Payer: Self-pay

## 2024-11-05 ENCOUNTER — Other Ambulatory Visit (HOSPITAL_COMMUNITY): Payer: Self-pay

## 2024-11-06 ENCOUNTER — Encounter: Payer: Self-pay | Admitting: Podiatry

## 2024-11-06 ENCOUNTER — Ambulatory Visit: Admitting: Podiatry

## 2024-11-06 ENCOUNTER — Other Ambulatory Visit: Payer: Self-pay

## 2024-11-06 DIAGNOSIS — M216X1 Other acquired deformities of right foot: Secondary | ICD-10-CM | POA: Diagnosis not present

## 2024-11-06 DIAGNOSIS — M216X2 Other acquired deformities of left foot: Secondary | ICD-10-CM

## 2024-11-07 ENCOUNTER — Ambulatory Visit: Payer: Self-pay

## 2024-11-07 NOTE — Progress Notes (Signed)
 Subjective:   Patient ID: [name], male   DOB: 34 y.o.   MRN: [id number]   HPI Patient presents with significant acquired foot deformity with flatfeet and severe lesions bilateral with patient having diabetes not in good control and works a fish farm manager job   ROS      Objective:  Physical Exam  Neurovascular is unchanged from previous visit with severe keratotic lesions submet 5 bilateral hallux right over left with flatfoot deformity that was acquired secondary to his work schedule and his natural foot structure     Assessment:  Chronic foot disease with chronic lesions with significant diabetic neuropathy     Plan:  H&P orthotics were dispensed fitted properly to his feet to try to address the deformity and offload some of the weight from the pressure points.  I did do courtesy debridement of lesions today no breakdown of skin was noted

## 2024-11-10 ENCOUNTER — Other Ambulatory Visit: Payer: Self-pay

## 2024-11-18 ENCOUNTER — Ambulatory Visit: Payer: Self-pay | Admitting: Student

## 2024-11-18 ENCOUNTER — Encounter: Payer: Self-pay | Admitting: Student

## 2024-11-18 ENCOUNTER — Telehealth: Payer: Self-pay | Admitting: Dietician

## 2024-11-18 ENCOUNTER — Ambulatory Visit: Payer: Self-pay | Admitting: Dietician

## 2024-11-18 VITALS — BP 127/82 | HR 92 | Temp 98.0°F | Ht 66.0 in | Wt 196.6 lb

## 2024-11-18 DIAGNOSIS — Z794 Long term (current) use of insulin: Secondary | ICD-10-CM

## 2024-11-18 DIAGNOSIS — E1110 Type 2 diabetes mellitus with ketoacidosis without coma: Secondary | ICD-10-CM

## 2024-11-18 DIAGNOSIS — N529 Male erectile dysfunction, unspecified: Secondary | ICD-10-CM | POA: Diagnosis not present

## 2024-11-18 DIAGNOSIS — Z7985 Long-term (current) use of injectable non-insulin antidiabetic drugs: Secondary | ICD-10-CM

## 2024-11-18 DIAGNOSIS — E119 Type 2 diabetes mellitus without complications: Secondary | ICD-10-CM

## 2024-11-18 LAB — POCT GLYCOSYLATED HEMOGLOBIN (HGB A1C): HbA1c, POC (controlled diabetic range): 9.1 % — AB (ref 0.0–7.0)

## 2024-11-18 LAB — GLUCOSE, CAPILLARY: Glucose-Capillary: 151 mg/dL — ABNORMAL HIGH (ref 70–99)

## 2024-11-18 MED ORDER — DEXCOM G7 SENSOR MISC: 3 refills | Status: AC

## 2024-11-18 MED ORDER — DEXCOM G7 SENSOR MISC: 5 refills | Status: DC

## 2024-11-18 MED ORDER — TADALAFIL 20 MG PO TABS: 20.0000 mg | ORAL_TABLET | ORAL | 1 refills | Status: AC | PRN

## 2024-11-18 MED ORDER — SEMAGLUTIDE(0.25 OR 0.5MG/DOS) 2 MG/3ML ~~LOC~~ SOPN: 0.2500 mg | PEN_INJECTOR | SUBCUTANEOUS | 4 refills | Status: AC

## 2024-11-18 NOTE — Telephone Encounter (Signed)
"   Call to patient about using Continuous glucose monitoring to self monitor his diabetes. He Has used freestyle libre in past with phone as receiver and like dit. He thinks he can figure out how to use either Continuous glucose monitor. Suggest dispensing 3 sensors each time.  Eventually change to the Dexcom G7 15 day sensor. He agreed to make an appointment with myself in 1 month or sooner if he needs help learning how to use the CGM "

## 2024-11-18 NOTE — Progress Notes (Signed)
 The patient left the office before the visit. He was called. See phone note. Arland Sabrie Moritz, RD 11/18/2024 11:13 AM.

## 2024-11-18 NOTE — Patient Instructions (Addendum)
 [name], Thank you for allowing me to take part in your care today.  Here are your instructions.  1.  I have increased your Cialis  to 20 mg daily.  Please take this 2 hours - 3 hours prior to sexual intercourse.  2.  Your A1c is 9.1, this is improving.  Continue taking Lantus  20 units daily.  I have ordered for CGM.  I have Arland reach out to you.  3.  Please come back in 1 month.   PLEASE BRING YOUR MEDICATIONS TO EVERY APPOINTMENT  Thank you, Dr. Tobie  If you have any other questions please contact the internal medicine clinic at [phone] If it is after hours, please call the [facility] at [phone] and then ask the person who picks up for the resident on call.

## 2024-11-18 NOTE — Progress Notes (Signed)
 "  CC: Chronic condition follow-up  HPI:  [name] is a 34 y.o. Male with a past medical history of asthma, type 2 diabetes on insulin , ketosis-prone, elevated blood pressure, OUD who presents for diabetes follow-up.  Please see assessment and plan for full HPI.  Medications: Erectile dysfunction: Cialis  20 milligrams as needed OUD: Suboxone  8-2 mg 3 times daily  Type 2 diabetes: Metformin  1000 mg twice daily, Lantus  20 units daily Foot infection: Doxycycline  for 2 weeks  Last seen in clinic on 08/12/2024. At that time discussed OUD.  Patient continued Suboxone .  Patient was started on Cialis .  A1c was 11.6.  Plan was to ramp up metformin .  Patient had trouble affording his insulin .  Was self titrating insulin .  Patient was started on Basaglar  20 units daily but seems like he is on Lantus .  Patient did have elevated blood pressures.  Did not start any medications.  Past Medical History:  Diagnosis Date   Asthma    HTN (hypertension) 07/25/2017   Lisinopril  40mg  Daily   Hyperglycemia 09/24/2017   Type II diabetes mellitus (HCC)    dx'd 06/2017    Current Medications[1]  Review of Systems:    Negative except for what is stated in HPI  Physical Exam:  Vitals:   11/18/24 0919 11/18/24 1007  BP: (!) 134/96 127/82  Pulse: 88 92  Temp: 98 F (36.7 C)   TempSrc: Oral   SpO2: 100%   Weight: 196 lb 9.6 oz (89.2 kg)   Height: 5' 6 (1.676 m)     General: Patient is sitting comfortably in the room  Head: Normocephalic, atraumatic  Cardio: Regular rate and rhythm, no murmurs, rubs or gallops Pulmonary: Clear to ausculation bilaterally with no rales, rhonchi, and crackles  Extremities: Bilateral lower extremities 1+ pedal pulses.  Calluses noted to bilateral lower extremities.  2 calluses on right lower extremity.  1 callus on left lower extremity.  Please see media tab for picture.   Assessment & Plan:   Assessment & Plan Type 2 diabetes mellitus with  ketoacidosis without coma, unspecified whether long term insulin  use (HCC) Patient has a past medical history of type 2 diabetes mellitus.  He has a history of ketoacidosis.  He presents for follow-up of diabetes.  A1c today is 9.1.  This is improved from 11.6 previously.  His current regimen includes Lantus  which he takes on a sliding scale.  He does not take his metformin .  He states he has been trying to stay away from juices and sodas.  He states he takes his Lantus  on a sliding scale, as he is scared to take 20 units all at once.  He is worried about lows.  However he has had no symptoms of hypoglycemia.  I educated him today on the importance of staying adherent to his Lantus  20 units daily.  I offered CGM to help him with monitoring his sugars.  He agreed to this.  Foot exam updated today.  Patient has regular eye exams.  Not a candidate for SGLT2 in the setting of history of DKA.  Could benefit from GLP-1 which she is agreeable to today.  He states he does not take his metformin  because it makes him feel bad.  I encouraged him to continue taking it.  Plan: - Start Ozempic  0.25 mg weekly - Continue Lantus  20 unit daily - Regular eye exams - Foot exam updated today - Wrote for CGM, [place] to contact patient for application  - Diabetic education provided  today - Encourage patient to continue taking metformin  1000 mg twice daily Erectile dysfunction, unspecified erectile dysfunction type Patient presents to clinic today with concerns of erectile dysfunction.  He was recently seen for the same issue.  He reports that he has been trying 10 mg Cialis .  This has not helped him.  He reports that he would like to try higher dose.  I think this is likely in the setting of uncontrolled blood sugars.  I discussed this with him today.  Will trial Cialis  20 mg daily.  Plan: - Trial Cialis  20 mg.   Patient discussed with Dr. Jeanelle Libby Blanch, DO Internal Medicine Resident PGY-3     [1]  Current  Outpatient Medications:    Semaglutide ,0.25 or 0.5MG /DOS, 2 MG/3ML SOPN, Inject 0.25 mg into the skin once a week., Disp: 3 mL, Rfl: 4   B-D ULTRA-FINE 33 LANCETS MISC, Use one lancet once daily., Disp: 100 each, Rfl: 3   blood glucose meter kit and supplies KIT, Use up to four times daily as directed., Disp: 1 each, Rfl: 0   Blood Glucose Monitoring Suppl (ONETOUCH VERIO) w/Device KIT, Use once daily., Disp: 1 kit, Rfl: 2   Blood Glucose Monitoring Suppl (TRUE METRIX METER) w/Device KIT, USE AS DIRECTED, Disp: 1 kit, Rfl: 0   buprenorphine -naloxone  (SUBOXONE ) 8-2 mg SUBL SL tablet, Place 1 tablet under the tongue 3 (three) times daily., Disp: 90 tablet, Rfl: 0   clindamycin  (CLINDAGEL ) 1 % gel, Apply topically 2 (two) times daily., Disp: 30 g, Rfl: 0   Continuous Glucose Sensor (DEXCOM G7 SENSOR) MISC, Please use to check glucose every 10 days, Disp: 1 each, Rfl: 5   doxycycline  (VIBRA -TABS) 100 MG tablet, Take 1 tablet (100 mg total) by mouth 2 (two) times daily., Disp: 30 tablet, Rfl: 1   glucose blood (FREESTYLE LITE) test strip, use up to 4 times daily to check blood sugar, Disp: 100 each, Rfl: 0   glucose blood ([employer] BLOOD GLUCOSE TEST) test strip, Use to test once daily., Disp: 30 strip, Rfl: 12   glucose blood test strip, 1 each by Other route 4 (four) times daily. Use as instructed, Disp: 100 each, Rfl: 12   insulin  glargine (LANTUS ) 100 UNIT/ML Solostar Pen, Inject 20 Units into the skin daily., Disp: 15 mL, Rfl: 11   Insulin  Pen Needle (PEN NEEDLES 31GX5/16) 31G X 8 MM MISC, Use 1 (one) Syringe as directed, Disp: 100 each, Rfl: 3   Insulin  Pen Needle (UNIFINE PENTIPS) 32G X 4 MM MISC, Use 4 times daily, Disp: 100 each, Rfl: 5   Insulin  Pen Needle (UNIFINE PENTIPS) 32G X 4 MM MISC, Use 4 times daily, Disp: 100 each, Rfl: 5   Lancets (FREESTYLE) lancets, use up to 4 times daily to check blood sugar, Disp: 100 each, Rfl: 0   metFORMIN  (GLUCOPHAGE ) 500 MG tablet, Take 2 tablets (1,000 mg  total) by mouth 2 (two) times daily with a meal., Disp: 120 tablet, Rfl: 2   tadalafil  (CIALIS ) 20 MG tablet, Take 1 tablet (20 mg total) by mouth as needed for erectile dysfunction., Disp: 20 tablet, Rfl: 1  "

## 2024-11-18 NOTE — Assessment & Plan Note (Signed)
 Patient presents to clinic today with concerns of erectile dysfunction.  He was recently seen for the same issue.  He reports that he has been trying 10 mg Cialis .  This has not helped him.  He reports that he would like to try higher dose.  I think this is likely in the setting of uncontrolled blood sugars.  I discussed this with him today.  Will trial Cialis  20 mg daily.  Plan: - Trial Cialis  20 mg.

## 2024-11-19 NOTE — Progress Notes (Signed)
 Internal Medicine Clinic Attending  Case discussed with the resident at the time of the visit.  We reviewed the resident's history and exam and pertinent patient test results.  I agree with the assessment, diagnosis, and plan of care documented in the resident's note.

## 2024-11-20 ENCOUNTER — Telehealth: Payer: Self-pay

## 2024-11-20 NOTE — Telephone Encounter (Signed)
 Cordella Huge (Key: [id number]) PA Case ID #: [id number] Need Help? Call us  at [phone] Outcome Approved today by [facility] [employer]  Medicaid [place] Case: [id number], Status: Approved, Coverage Starts on: 11/20/2024 12:00:00 AM, Coverage Ends on: 05/19/2025 12:00:00 AM. Effective Date: 11/20/2024 Authorization Expiration Date: 05/19/2025 Drug Dexcom G7 Sensor ePA cloud logo Form [employer] [place]  Medicaid Electronic PA Form [phone] NCPDP)

## 2024-11-20 NOTE — Telephone Encounter (Addendum)
 Prior Authorization for patient (Dexcom G7 Sensor) came through on cover my meds was submitted with last office notes and labs awaiting approval or denial.  XZB:[id number]

## 2024-12-05 ENCOUNTER — Other Ambulatory Visit: Payer: Self-pay
